# Patient Record
Sex: Female | Born: 1980 | Race: White | Hispanic: No | Marital: Married | Smoking: Never smoker
Health system: Southern US, Community
[De-identification: ages and names within clinical notes are randomized; demographics above are authoritative.]

## PROBLEM LIST (undated history)

## (undated) DIAGNOSIS — D689 Coagulation defect, unspecified: Secondary | ICD-10-CM

## (undated) DIAGNOSIS — E119 Type 2 diabetes mellitus without complications: Secondary | ICD-10-CM

## (undated) DIAGNOSIS — K700 Alcoholic fatty liver: Secondary | ICD-10-CM

## (undated) DIAGNOSIS — R609 Edema, unspecified: Secondary | ICD-10-CM

## (undated) DIAGNOSIS — E785 Hyperlipidemia, unspecified: Secondary | ICD-10-CM

## (undated) DIAGNOSIS — R251 Tremor, unspecified: Secondary | ICD-10-CM

## (undated) DIAGNOSIS — Z973 Presence of spectacles and contact lenses: Secondary | ICD-10-CM

## (undated) DIAGNOSIS — D649 Anemia, unspecified: Secondary | ICD-10-CM

## (undated) DIAGNOSIS — R011 Cardiac murmur, unspecified: Secondary | ICD-10-CM

## (undated) DIAGNOSIS — F1010 Alcohol abuse, uncomplicated: Secondary | ICD-10-CM

## (undated) DIAGNOSIS — R112 Nausea with vomiting, unspecified: Secondary | ICD-10-CM

## (undated) DIAGNOSIS — F419 Anxiety disorder, unspecified: Secondary | ICD-10-CM

## (undated) DIAGNOSIS — R002 Palpitations: Secondary | ICD-10-CM

## (undated) DIAGNOSIS — N920 Excessive and frequent menstruation with regular cycle: Secondary | ICD-10-CM

## (undated) DIAGNOSIS — N189 Chronic kidney disease, unspecified: Secondary | ICD-10-CM

## (undated) DIAGNOSIS — Z9889 Other specified postprocedural states: Secondary | ICD-10-CM

## (undated) DIAGNOSIS — K219 Gastro-esophageal reflux disease without esophagitis: Secondary | ICD-10-CM

## (undated) DIAGNOSIS — R9431 Abnormal electrocardiogram [ECG] [EKG]: Secondary | ICD-10-CM

## (undated) DIAGNOSIS — R1115 Cyclical vomiting syndrome unrelated to migraine: Secondary | ICD-10-CM

## (undated) DIAGNOSIS — E569 Vitamin deficiency, unspecified: Secondary | ICD-10-CM

## (undated) HISTORY — DX: Type 2 diabetes mellitus without complications: E11.9

## (undated) HISTORY — DX: Chronic kidney disease, unspecified: N18.9

## (undated) HISTORY — DX: Coagulation defect, unspecified: D68.9

---

## 2009-05-01 ENCOUNTER — Ambulatory Visit (HOSPITAL_COMMUNITY): Admission: RE | Admit: 2009-05-01 | Discharge: 2009-05-01 | Payer: Self-pay | Admitting: Obstetrics and Gynecology

## 2009-09-12 ENCOUNTER — Inpatient Hospital Stay (HOSPITAL_COMMUNITY): Admission: AD | Admit: 2009-09-12 | Discharge: 2009-09-14 | Payer: Self-pay | Admitting: Obstetrics and Gynecology

## 2010-10-31 LAB — CBC
HCT: 37.6 % (ref 36.0–46.0)
Hemoglobin: 12.5 g/dL (ref 12.0–15.0)
MCHC: 33.2 g/dL (ref 30.0–36.0)
MCHC: 33.8 g/dL (ref 30.0–36.0)
MCV: 89 fL (ref 78.0–100.0)
MCV: 89.7 fL (ref 78.0–100.0)
MCV: 89.7 fL (ref 78.0–100.0)
Platelets: 188 10*3/uL (ref 150–400)
Platelets: 216 10*3/uL (ref 150–400)
Platelets: ADEQUATE 10*3/uL (ref 150–400)
RBC: 3.92 MIL/uL (ref 3.87–5.11)
RDW: 13.1 % (ref 11.5–15.5)
RDW: 13.2 % (ref 11.5–15.5)
WBC: 9.3 10*3/uL (ref 4.0–10.5)

## 2017-06-10 ENCOUNTER — Ambulatory Visit (INDEPENDENT_AMBULATORY_CARE_PROVIDER_SITE_OTHER): Payer: Managed Care, Other (non HMO) | Admitting: Orthopaedic Surgery

## 2017-06-10 ENCOUNTER — Ambulatory Visit (INDEPENDENT_AMBULATORY_CARE_PROVIDER_SITE_OTHER): Payer: Managed Care, Other (non HMO)

## 2017-06-10 DIAGNOSIS — M25561 Pain in right knee: Secondary | ICD-10-CM

## 2017-06-10 MED ORDER — NAPROXEN 500 MG PO TABS: 500.0000 mg | ORAL_TABLET | Freq: Two times a day (BID) | ORAL | 3 refills | Status: DC

## 2017-06-10 NOTE — Progress Notes (Signed)
Office Visit Note   Patient: Janice Arnold           Date of Birth: 21-Jun-1981           MRN: [id number] Visit Date: 06/10/2017              Requested by: No referring provider defined for this encounter. PCP: Patient, No Pcp Per   Assessment & Plan: Visit Diagnoses:  1. Acute pain of right knee     Plan: Impression is right leg pain likely anterior muscular compartment strain.  Recommend ice and heat, relative rest, prescription for naproxen.  Questions encouraged and answered.  If not better should follow-up and will consider advanced imaging.  Follow-Up Instructions: Return if symptoms worsen or fail to improve.   Orders:  Orders Placed This Encounter  Procedures  . XR KNEE 3 VIEW RIGHT   Meds ordered this encounter  Medications  . naproxen (NAPROSYN) 500 MG tablet    Sig: Take 1 tablet (500 mg total) by mouth 2 (two) times daily with a meal.    Dispense:  30 tablet    Refill:  3      Procedures: No procedures performed   Clinical Data: No additional findings.   Subjective: Chief Complaint  Patient presents with  . Right Knee - Pain, Injury    Janice Arnold is a 36 year old female who slipped and fell on Friday and landed on her buttock.  She states that her right leg swelling outward.  She states she has constant 7 out of 10 pain that radiates up and down the leg.  Her pain is mainly on the anterolateral aspect of the proximal tibia.  She denies any swelling or bruising.  Denies any numbness and tingling.    Review of Systems  Constitutional: Negative.   HENT: Negative.   Eyes: Negative.   Respiratory: Negative.   Cardiovascular: Negative.   Endocrine: Negative.   Musculoskeletal: Negative.   Neurological: Negative.   Hematological: Negative.   Psychiatric/Behavioral: Negative.   All other systems reviewed and are negative.    Objective: Vital Signs: There were no vitals taken for this visit.  Physical Exam  Constitutional: She is oriented to  person, place, and time. She appears well-developed and well-nourished.  HENT:  Head: Normocephalic and atraumatic.  Eyes: EOM are normal.  Neck: Neck supple.  Pulmonary/Chest: Effort normal.  Abdominal: Soft.  Neurological: She is alert and oriented to person, place, and time.  Skin: Skin is warm. Capillary refill takes less than 2 seconds.  Psychiatric: She has a normal mood and affect. Her behavior is normal. Judgment and thought content normal.  Nursing note and vitals reviewed.   Ortho Exam Right knee exam shows no joint effusion.  Collaterals and cruciates are stable.  Patella tracking is normal.  She is tender over the proximal insertion of the anterior compartment.  Ankle plantarflexion and dorsiflexion elicits moderate pain.  There is no significant swelling or bruising that I can appreciate.  Negative Tinel's at the fibular head.  Foot is neurovascular intact. Specialty Comments:  No specialty comments available.  Imaging: Xr Knee 3 View Right  Result Date: 06/10/2017 No acute bony or structural abnormalities    PMFS History: There are no active problems to display for this patient.  No past medical history on file.  No family history on file.  No past surgical history on file. Social History   Occupational History  . Not on file.   Social History Main Topics  .  Smoking status: Not on file  . Smokeless tobacco: Not on file  . Alcohol use Not on file  . Drug use: Unknown  . Sexual activity: Not on file

## 2019-09-17 ENCOUNTER — Ambulatory Visit: Payer: Self-pay | Admitting: Family Medicine

## 2019-10-06 ENCOUNTER — Encounter: Payer: Self-pay | Admitting: Family Medicine

## 2019-10-06 ENCOUNTER — Ambulatory Visit (INDEPENDENT_AMBULATORY_CARE_PROVIDER_SITE_OTHER): Payer: Managed Care, Other (non HMO) | Admitting: Family Medicine

## 2019-10-06 ENCOUNTER — Other Ambulatory Visit: Payer: Self-pay

## 2019-10-06 VITALS — BP 132/76 | HR 90 | Temp 97.5°F | Ht 67.0 in | Wt 241.6 lb

## 2019-10-06 DIAGNOSIS — Z803 Family history of malignant neoplasm of breast: Secondary | ICD-10-CM

## 2019-10-06 DIAGNOSIS — Z0000 Encounter for general adult medical examination without abnormal findings: Secondary | ICD-10-CM | POA: Diagnosis not present

## 2019-10-06 DIAGNOSIS — L659 Nonscarring hair loss, unspecified: Secondary | ICD-10-CM

## 2019-10-06 DIAGNOSIS — Z23 Encounter for immunization: Secondary | ICD-10-CM

## 2019-10-06 DIAGNOSIS — Z114 Encounter for screening for human immunodeficiency virus [HIV]: Secondary | ICD-10-CM

## 2019-10-06 DIAGNOSIS — R590 Localized enlarged lymph nodes: Secondary | ICD-10-CM | POA: Diagnosis not present

## 2019-10-06 DIAGNOSIS — Z975 Presence of (intrauterine) contraceptive device: Secondary | ICD-10-CM

## 2019-10-06 LAB — CBC WITH DIFFERENTIAL/PLATELET
Basophils Absolute: 0.1 10*3/uL (ref 0.0–0.1)
Basophils Relative: 1.2 % (ref 0.0–3.0)
Eosinophils Absolute: 0.2 10*3/uL (ref 0.0–0.7)
Eosinophils Relative: 1.9 % (ref 0.0–5.0)
HCT: 41.4 % (ref 36.0–46.0)
Hemoglobin: 13.9 g/dL (ref 12.0–15.0)
Lymphocytes Relative: 28.3 % (ref 12.0–46.0)
Lymphs Abs: 2.4 10*3/uL (ref 0.7–4.0)
MCHC: 33.5 g/dL (ref 30.0–36.0)
MCV: 109.2 fl — ABNORMAL HIGH (ref 78.0–100.0)
Monocytes Absolute: 0.4 10*3/uL (ref 0.1–1.0)
Monocytes Relative: 4.4 % (ref 3.0–12.0)
Neutro Abs: 5.5 10*3/uL (ref 1.4–7.7)
Neutrophils Relative %: 64.2 % (ref 43.0–77.0)
Platelets: 309 10*3/uL (ref 150.0–400.0)
RBC: 3.8 Mil/uL — ABNORMAL LOW (ref 3.87–5.11)
RDW: 14.5 % (ref 11.5–15.5)
WBC: 8.6 10*3/uL (ref 4.0–10.5)

## 2019-10-06 LAB — COMPREHENSIVE METABOLIC PANEL
ALT: 38 U/L — ABNORMAL HIGH (ref 0–35)
AST: 113 U/L — ABNORMAL HIGH (ref 0–37)
Albumin: 4.1 g/dL (ref 3.5–5.2)
Alkaline Phosphatase: 75 U/L (ref 39–117)
BUN: 6 mg/dL (ref 6–23)
CO2: 27 mEq/L (ref 19–32)
Calcium: 9.4 mg/dL (ref 8.4–10.5)
Chloride: 103 mEq/L (ref 96–112)
Creatinine, Ser: 0.53 mg/dL (ref 0.40–1.20)
GFR: 128.95 mL/min (ref >60.00)
Glucose, Bld: 82 mg/dL (ref 70–99)
Potassium: 4 mEq/L (ref 3.5–5.1)
Sodium: 143 mEq/L (ref 135–145)
Total Bilirubin: 0.9 mg/dL (ref 0.2–1.2)
Total Protein: 7.6 g/dL (ref 6.0–8.3)

## 2019-10-06 LAB — LIPID PANEL
Cholesterol: 184 mg/dL (ref 0–200)
HDL: 52.6 mg/dL (ref >39.00)
LDL Cholesterol: 106 mg/dL — ABNORMAL HIGH (ref 0–99)
NonHDL: 130.93
Total CHOL/HDL Ratio: 3
Triglycerides: 123 mg/dL (ref 0.0–149.0)
VLDL: 24.6 mg/dL (ref 0.0–40.0)

## 2019-10-06 LAB — VITAMIN D 25 HYDROXY (VIT D DEFICIENCY, FRACTURES): VITD: <7 ng/mL — ABNORMAL LOW (ref 30.00–100.00)

## 2019-10-06 LAB — TSH: TSH: 3.16 u[IU]/mL (ref 0.35–4.50)

## 2019-10-06 NOTE — Patient Instructions (Signed)
1) flu shot today, need tdap in November of 2021 2) NEED GYN appointment!!!! IUD needs replaced and need pap smear!!!! 3) ordered diagnostic mammogram 4) checking labs( thyroid). Other thoughts on hair loss is telogen effluvium  5) after 4 weeks, email or call me about lymph nodes. If still there we need to CT you.    Preventive Care 75-39 Years Old, Female Preventive care refers to visits with your health care provider and lifestyle choices that can promote health and wellness. This includes:  A yearly physical exam. This may also be called an annual well check.  Regular dental visits and eye exams.  Immunizations.  Screening for certain conditions.  Healthy lifestyle choices, such as eating a healthy diet, getting regular exercise, not using drugs or products that contain nicotine and tobacco, and limiting alcohol use. What can I expect for my preventive care visit? Physical exam Your health care provider will check your:  Height and weight. This may be used to calculate body mass index (BMI), which tells if you are at a healthy weight.  Heart rate and blood pressure.  Skin for abnormal spots. Counseling Your health care provider may ask you questions about your:  Alcohol, tobacco, and drug use.  Emotional well-being.  Home and relationship well-being.  Sexual activity.  Eating habits.  Work and work Statistician.  Method of birth control.  Menstrual cycle.  Pregnancy history. What immunizations do I need?  Influenza (flu) vaccine  This is recommended every year. Tetanus, diphtheria, and pertussis (Tdap) vaccine  You may need a Td booster every 10 years. Varicella (chickenpox) vaccine  You may need this if you have not been vaccinated. Human papillomavirus (HPV) vaccine  If recommended by your health care provider, you may need three doses over 6 months. Measles, mumps, and rubella (MMR) vaccine  You may need at least one dose of MMR. You may also need  a second dose. Meningococcal conjugate (MenACWY) vaccine  One dose is recommended if you are age 4-21 years and a first-year college student living in a residence hall, or if you have one of several medical conditions. You may also need additional booster doses. Pneumococcal conjugate (PCV13) vaccine  You may need this if you have certain conditions and were not previously vaccinated. Pneumococcal polysaccharide (PPSV23) vaccine  You may need one or two doses if you smoke cigarettes or if you have certain conditions. Hepatitis A vaccine  You may need this if you have certain conditions or if you travel or work in places where you may be exposed to hepatitis A. Hepatitis B vaccine  You may need this if you have certain conditions or if you travel or work in places where you may be exposed to hepatitis B. Haemophilus influenzae type b (Hib) vaccine  You may need this if you have certain conditions. You may receive vaccines as individual doses or as more than one vaccine together in one shot (combination vaccines). Talk with your health care provider about the risks and benefits of combination vaccines. What tests do I need?  Blood tests  Lipid and cholesterol levels. These may be checked every 5 years starting at age 63.  Hepatitis C test.  Hepatitis B test. Screening  Diabetes screening. This is done by checking your blood sugar (glucose) after you have not eaten for a while (fasting).  Sexually transmitted disease (STD) testing.  BRCA-related cancer screening. This may be done if you have a family history of breast, ovarian, tubal, or peritoneal cancers.  Pelvic exam and Pap test. This may be done every 3 years starting at age 21. Starting at age 30, this may be done every 5 years if you have a Pap test in combination with an HPV test. Talk with your health care provider about your test results, treatment options, and if necessary, the need for more tests. Follow these  instructions at home: Eating and drinking   Eat a diet that includes fresh fruits and vegetables, whole grains, lean protein, and low-fat dairy.  Take vitamin and mineral supplements as recommended by your health care provider.  Do not drink alcohol if: ? Your health care provider tells you not to drink. ? You are pregnant, may be pregnant, or are planning to become pregnant.  If you drink alcohol: ? Limit how much you have to 0-1 drink a day. ? Be aware of how much alcohol is in your drink. In the [place] one drink equals one 12 oz bottle of beer (355 mL), one 5 oz glass of wine (148 mL), or one 1 oz glass of hard liquor (44 mL). Lifestyle  Take daily care of your teeth and gums.  Stay active. Exercise for at least 30 minutes on 5 or more days each week.  Do not use any products that contain nicotine or tobacco, such as cigarettes, e-cigarettes, and chewing tobacco. If you need help quitting, ask your health care provider.  If you are sexually active, practice safe sex. Use a condom or other form of birth control (contraception) in order to prevent pregnancy and STIs (sexually transmitted infections). If you plan to become pregnant, see your health care provider for a preconception visit. What's next?  Visit your health care provider once a year for a well check visit.  Ask your health care provider how often you should have your eyes and teeth checked.  Stay up to date on all vaccines. This information is not intended to replace advice given to you by your health care provider. Make sure you discuss any questions you have with your health care provider. Document Revised: 04/09/2018 Document Reviewed: 04/09/2018 Elsevier Patient Education  2020 Elsevier Inc.  

## 2019-10-06 NOTE — Progress Notes (Signed)
Patient: Janice Arnold MRN: [id number] DOB: [birth year] PCP: Orma Flaming, MD     Subjective:  Chief Complaint  Patient presents with  . Annual Exam  . Alopecia  . enlarged lymph node    HPI: The patient is a 39 y.o. female who presents today for annual exam. She denies any changes to past medical history. There have been no recent hospitalizations. They are following a well balanced diet and exercise plan. Weight has been decreasing steadily. She was at 370 in 2019. She has lost a significant amount of weight since that time.  She has concerns of hair loss and possible swollen lymph node.   She has a strong family history of breast cancer in her maternal side including mom (41s), grandmother (26s). No colon cancer in first degree relative. No HTN or diabetes.    She first noticed her hair loss in April of 2019. Her entire head has now thinned out noticeably. She doesn't feel like it's growing. She was under a lot of stress at that time. She has not had her thyroid checked. No round circles of hair loss. No hair loss over any other part of body.   Lump above left clavicle and around her left jaw. First noticed about 1 week ago because it was painful in this area. They are not growing, but are tender. She denies any bruising/rash/bleeding. No fever/night sweats.   Immunization History  Administered Date(s) Administered  . Influenza,inj,Quad PF,6+ Mos 10/06/2019    Colonoscopy: routine screening.  Mammogram: needs this with family history/lymph node  Pap smear: 10 years.    Review of Systems  Constitutional: Negative for chills, fatigue and fever.  HENT: Negative for sinus pressure, sinus pain and sore throat.   Respiratory: Negative for cough, shortness of breath and wheezing.   Cardiovascular: Negative for chest pain and palpitations.  Gastrointestinal: Negative for abdominal pain, nausea and vomiting.  Endocrine:       Hair loss   Genitourinary: Negative for frequency,  pelvic pain and urgency.  Musculoskeletal: Negative for back pain, joint swelling and neck pain.  Neurological: Negative for dizziness and headaches.  Hematological: Positive for adenopathy.    Allergies Patient has No Known Allergies.  Past Medical History Patient  has no past medical history on file.  Surgical History Patient  has no past surgical history on file.  Family History Pateint's family history includes Breast cancer in her mother; Cervical cancer in her mother.  Social History Patient  reports that she has never smoked. She has never used smokeless tobacco. She reports current alcohol use of about 14.0 standard drinks of alcohol per week. She reports that she does not use drugs.    Objective: Vitals:   10/06/19 0829  BP: 132/76  Pulse: 90  Temp: (!) 97.5 F (36.4 C)  TempSrc: Temporal  SpO2: 97%  Weight: 241 lb 9.6 oz (109.6 kg)  Height: 5\' 7"  (1.702 m)    Body mass index is 37.84 kg/m.  Physical Exam Vitals reviewed.  Constitutional:      Appearance: Normal appearance. She is well-developed. She is obese.  HENT:     Head: Normocephalic and atraumatic.     Right Ear: Tympanic membrane, ear canal and external ear normal.     Left Ear: Tympanic membrane, ear canal and external ear normal.     Nose: Nose normal.     Mouth/Throat:     Mouth: Mucous membranes are moist.     Comments: Poor dentition  Eyes:  Extraocular Movements: Extraocular movements intact.     Conjunctiva/sclera: Conjunctivae normal.     Pupils: Pupils are equal, round, and reactive to light.  Neck:     Thyroid: No thyromegaly.  Cardiovascular:     Rate and Rhythm: Normal rate and regular rhythm.     Heart sounds: Normal heart sounds. No murmur.  Pulmonary:     Effort: Pulmonary effort is normal.     Breath sounds: Normal breath sounds.  Abdominal:     General: Bowel sounds are normal. There is no distension.     Palpations: Abdomen is soft.     Tenderness: There is no  abdominal tenderness.  Musculoskeletal:     Cervical back: Normal range of motion and neck supple.  Lymphadenopathy:     Head:     Left side of head: Submandibular adenopathy present.     Cervical: No cervical adenopathy.     Upper Body:     Left upper body: Supraclavicular adenopathy present. No axillary adenopathy.  Skin:    General: Skin is warm and dry.     Capillary Refill: Capillary refill takes less than 2 seconds.     Findings: No rash.     Comments: Hair pull test negative. Thinning of hair globally   Neurological:     General: No focal deficit present.     Mental Status: She is alert and oriented to person, place, and time.     Cranial Nerves: No cranial nerve deficit.     Coordination: Coordination normal.     Deep Tendon Reflexes: Reflexes normal.  Psychiatric:        Mood and Affect: Mood normal.        Behavior: Behavior normal.        Office Visit from 10/06/2019 in [facility]  PHQ-2 Total Score  0         Assessment/plan: 1. Annual physical exam Routine lab work today. Flu shot today. Needs pap smear, but also needs iud removed. Will have her f/u with gyn as she wants another iud put in place. Discussed diet/exercise. May want referral to healthy weight and wellness, but wants to get labs back first. Really needs to see dentist as well.  Patient counseling [x]    Nutrition: Stressed importance of moderation in sodium/caffeine intake, saturated fat and cholesterol, caloric balance, sufficient intake of fresh fruits, vegetables, fiber, calcium, iron, and 1 mg of folate supplement per day (for females capable of pregnancy).  [x]    Stressed the importance of regular exercise.   []    Substance Abuse: Discussed cessation/primary prevention of tobacco, alcohol, or other drug use; driving or other dangerous activities under the influence; availability of treatment for abuse.   [x]    Injury prevention: Discussed safety belts, safety helmets, smoke  detector, smoking near bedding or upholstery.   [x]    Sexuality: Discussed sexually transmitted diseases, partner selection, use of condoms, avoidance of unintended pregnancy  and contraceptive alternatives.  [x]    Dental health: Discussed importance of regular tooth brushing, flossing, and dental visits.  [x]    Health maintenance and immunizations reviewed. Please refer to Health maintenance section.    - CBC with Differential/Platelet - Comprehensive metabolic panel - Lipid panel - TSH - VITAMIN D 25 Hydroxy (Vit-D Deficiency, Fractures)  2. Encounter for screening for HIV  - HIV Antibody (routine testing w rflx)  3. Supraclavicular lymphadenopathy -mmg ordered with family hx and this finding. Will get labs back and discussed next step is CT  of her neck/chest.   Submandibular lymph node Could be secondary to tooth that needs pulled and poor gum health. No abscess seen or indication for antibiotics, but recommended she see dentist asap. Let me know if any feverrworsening symptoms.  - MM DIGITAL SCREENING BILATERAL; Future  4. Family history of breast cancer  - MM DIGITAL SCREENING BILATERAL; Future  5. Hair thinning thyroid vs. Genetics vs. Telogen effluvium. No findings to warrant ANA at this time or other auto immune associated hair loss.   6. IUD (intrauterine device) in place Has had x 10 years. Needs removed. Discussed I could do, but if wants another would have her f/u with gyn. She has a gyn she would like to use and advised back up contraception.   Total time of encounter: 45 minutes total time of encounter, including 30 minutes spent in face-to-face patient care. This time includes coordination of care and counseling regarding new patient care, annual, HM, acute complaints and work up. Remainder of non-face-to-face time involved reviewing chart documents/testing relevant to the patient encounter and documentation in the medical record.   This visit occurred during the  SARS-CoV-2 public health emergency.  Safety protocols were in place, including screening questions prior to the visit, additional usage of staff PPE, and extensive cleaning of exam room while observing appropriate contact time as indicated for disinfecting solutions.     Return if symptoms worsen or fail to improve.     Orma Flaming, MD [place]  10/06/2019

## 2019-10-07 ENCOUNTER — Encounter: Payer: Self-pay | Admitting: Family Medicine

## 2019-10-07 ENCOUNTER — Other Ambulatory Visit: Payer: Self-pay | Admitting: Family Medicine

## 2019-10-07 DIAGNOSIS — E559 Vitamin D deficiency, unspecified: Secondary | ICD-10-CM | POA: Insufficient documentation

## 2019-10-07 DIAGNOSIS — R7401 Elevation of levels of liver transaminase levels: Secondary | ICD-10-CM | POA: Insufficient documentation

## 2019-10-07 LAB — HIV ANTIBODY (ROUTINE TESTING W REFLEX): HIV 1&2 Ab, 4th Generation: NONREACTIVE

## 2019-10-07 MED ORDER — VITAMIN D (ERGOCALCIFEROL) 1.25 MG (50000 UNIT) PO CAPS: ORAL_CAPSULE | ORAL | 0 refills | Status: DC

## 2019-10-11 ENCOUNTER — Telehealth: Payer: Self-pay | Admitting: Family Medicine

## 2019-10-11 NOTE — Telephone Encounter (Signed)
Called [facility] to make aware of order for Diagnostic mammogram placed. They will reach out to pt to schedule.

## 2019-10-11 NOTE — Addendum Note (Signed)
Addended by: Orma Flaming on: 10/11/2019 01:25 PM   Modules accepted: Orders

## 2019-10-11 NOTE — Telephone Encounter (Signed)
Patient called in this morning and said her mammogram was sent over as a screening but in the notes it states it is for diagnostic, the breast center is needing this changed before they schedule an appointment.

## 2019-10-11 NOTE — Telephone Encounter (Signed)
Please let them know that I put in order for diagnostic mmg.  Thanks,  Dr. Rogers Blocker

## 2019-10-11 NOTE — Telephone Encounter (Signed)
Please Advise

## 2019-10-12 ENCOUNTER — Other Ambulatory Visit: Payer: Self-pay | Admitting: Family Medicine

## 2019-10-12 DIAGNOSIS — R590 Localized enlarged lymph nodes: Secondary | ICD-10-CM

## 2019-10-12 DIAGNOSIS — Z803 Family history of malignant neoplasm of breast: Secondary | ICD-10-CM

## 2019-10-28 ENCOUNTER — Ambulatory Visit: Payer: Managed Care, Other (non HMO)

## 2019-10-28 ENCOUNTER — Other Ambulatory Visit: Payer: Self-pay | Admitting: Family Medicine

## 2019-10-28 ENCOUNTER — Other Ambulatory Visit: Payer: Self-pay

## 2019-10-28 ENCOUNTER — Ambulatory Visit
Admission: RE | Admit: 2019-10-28 | Discharge: 2019-10-28 | Disposition: A | Discharge status: Home / Self Care | Payer: Managed Care, Other (non HMO) | Source: Ambulatory Visit | Attending: Family Medicine | Admitting: Family Medicine

## 2019-10-28 DIAGNOSIS — Z803 Family history of malignant neoplasm of breast: Secondary | ICD-10-CM

## 2019-10-28 DIAGNOSIS — R921 Mammographic calcification found on diagnostic imaging of breast: Secondary | ICD-10-CM

## 2019-10-28 DIAGNOSIS — R590 Localized enlarged lymph nodes: Secondary | ICD-10-CM

## 2019-10-29 ENCOUNTER — Other Ambulatory Visit: Payer: Self-pay | Admitting: Family Medicine

## 2019-10-29 DIAGNOSIS — Z803 Family history of malignant neoplasm of breast: Secondary | ICD-10-CM

## 2019-11-04 ENCOUNTER — Ambulatory Visit: Payer: Managed Care, Other (non HMO) | Attending: Internal Medicine

## 2019-11-04 DIAGNOSIS — Z23 Encounter for immunization: Secondary | ICD-10-CM

## 2019-11-04 NOTE — Progress Notes (Signed)
   Covid-19 Vaccination Clinic  Name:  AYRIAN CARRISALEZ    MRN: [id number] DOB: 1980-10-10  11/04/2019  Ms. Zuker was observed post Covid-19 immunization for 15 minutes without incident. She was provided with Vaccine Information Sheet and instruction to access the V-Safe system.   Ms. Lamour was instructed to call 911 with any severe reactions post vaccine: Marland Kitchen Difficulty breathing  . Swelling of face and throat  . A fast heartbeat  . A bad rash all over body  . Dizziness and weakness   Immunizations Administered    Name Date Dose VIS Date Route   Moderna COVID-19 Vaccine 11/04/2019 12:05 PM 0.5 mL 07/13/2019 Intramuscular   Manufacturer: Moderna   Lot: [id number]   [id number]

## 2019-11-15 ENCOUNTER — Ambulatory Visit: Payer: Self-pay | Admitting: Family Medicine

## 2019-11-25 ENCOUNTER — Telehealth: Payer: Self-pay | Admitting: Family Medicine

## 2019-11-25 DIAGNOSIS — R590 Localized enlarged lymph nodes: Secondary | ICD-10-CM

## 2019-11-25 NOTE — Telephone Encounter (Signed)
Spoke with pt to give message below. Pt says that she will call to see if she can get an appointment soon, this has been going on for awhile. Pt voiced understanding.

## 2019-11-25 NOTE — Telephone Encounter (Signed)
Received a call from the patient, she says Dr.Wolfe told her to call in if her lymph nodes were still swollen after 4 weeks and patient states it still has not gone down. Would like the scan of the head/neck that was offered.

## 2019-11-25 NOTE — Telephone Encounter (Signed)
Please Advise

## 2019-11-25 NOTE — Telephone Encounter (Signed)
Lvm for pt to call the office back. 

## 2019-11-25 NOTE — Telephone Encounter (Signed)
Yes, I put this order in for her. Once this is scheduled she needs to come have her renal function checked here. I put order in for this. They have to have renal function within 30 days of imaging.   Dr. Rogers Blocker

## 2019-12-02 ENCOUNTER — Other Ambulatory Visit: Payer: Self-pay | Admitting: Family Medicine

## 2019-12-02 ENCOUNTER — Other Ambulatory Visit: Payer: Managed Care, Other (non HMO)

## 2019-12-02 DIAGNOSIS — R590 Localized enlarged lymph nodes: Secondary | ICD-10-CM

## 2019-12-07 ENCOUNTER — Ambulatory Visit: Payer: Managed Care, Other (non HMO) | Attending: Internal Medicine

## 2019-12-07 DIAGNOSIS — Z23 Encounter for immunization: Secondary | ICD-10-CM

## 2019-12-07 NOTE — Progress Notes (Signed)
   Covid-19 Vaccination Clinic  Name:  Janice Arnold    MRN: [id number] DOB: 16-Jun-1981  12/07/2019  [name] was observed post Covid-19 immunization for 15 minutes without incident. She was provided with Vaccine Information Sheet and instruction to access the V-Safe system.   [name] was instructed to call 911 with any severe reactions post vaccine: Marland Kitchen Difficulty breathing  . Swelling of face and throat  . A fast heartbeat  . A bad rash all over body  . Dizziness and weakness   Immunizations Administered    Name Date Dose VIS Date Route   Moderna COVID-19 Vaccine 12/07/2019 10:40 AM 0.5 mL 07/2019 Intramuscular   Manufacturer: Moderna   Lot: [id number]   [id number]

## 2019-12-10 ENCOUNTER — Ambulatory Visit
Admission: RE | Admit: 2019-12-10 | Discharge: 2019-12-10 | Disposition: A | Discharge status: Home / Self Care | Payer: Managed Care, Other (non HMO) | Source: Ambulatory Visit | Attending: Family Medicine | Admitting: Family Medicine

## 2019-12-10 DIAGNOSIS — R590 Localized enlarged lymph nodes: Secondary | ICD-10-CM

## 2020-01-04 ENCOUNTER — Other Ambulatory Visit: Payer: Self-pay | Admitting: Family Medicine

## 2020-02-26 ENCOUNTER — Emergency Department (HOSPITAL_COMMUNITY): Payer: Managed Care, Other (non HMO)

## 2020-02-26 ENCOUNTER — Encounter (HOSPITAL_COMMUNITY): Payer: Self-pay | Admitting: Emergency Medicine

## 2020-02-26 ENCOUNTER — Emergency Department (HOSPITAL_COMMUNITY)
Admission: EM | Admit: 2020-02-26 | Discharge: 2020-02-26 | Disposition: A | Discharge status: Home / Self Care | Payer: Managed Care, Other (non HMO) | Attending: Emergency Medicine | Admitting: Emergency Medicine

## 2020-02-26 ENCOUNTER — Other Ambulatory Visit: Payer: Self-pay

## 2020-02-26 DIAGNOSIS — Z975 Presence of (intrauterine) contraceptive device: Secondary | ICD-10-CM | POA: Diagnosis not present

## 2020-02-26 DIAGNOSIS — Y999 Unspecified external cause status: Secondary | ICD-10-CM | POA: Diagnosis not present

## 2020-02-26 DIAGNOSIS — S93602A Unspecified sprain of left foot, initial encounter: Secondary | ICD-10-CM | POA: Insufficient documentation

## 2020-02-26 DIAGNOSIS — S99922A Unspecified injury of left foot, initial encounter: Secondary | ICD-10-CM | POA: Diagnosis present

## 2020-02-26 DIAGNOSIS — Y9301 Activity, walking, marching and hiking: Secondary | ICD-10-CM | POA: Diagnosis not present

## 2020-02-26 DIAGNOSIS — Z23 Encounter for immunization: Secondary | ICD-10-CM | POA: Diagnosis not present

## 2020-02-26 DIAGNOSIS — W010XXA Fall on same level from slipping, tripping and stumbling without subsequent striking against object, initial encounter: Secondary | ICD-10-CM | POA: Insufficient documentation

## 2020-02-26 DIAGNOSIS — Y929 Unspecified place or not applicable: Secondary | ICD-10-CM | POA: Diagnosis not present

## 2020-02-26 MED ORDER — IBUPROFEN 400 MG PO TABS
600.0000 mg | ORAL_TABLET | Freq: Once | ORAL | Status: AC
  Administered 2020-02-26: 600 mg via ORAL
  Filled 2020-02-26: qty 1

## 2020-02-26 MED ORDER — TETANUS-DIPHTH-ACELL PERTUSSIS 5-2.5-18.5 LF-MCG/0.5 IM SUSP
0.5000 mL | Freq: Once | INTRAMUSCULAR | Status: AC
  Administered 2020-02-26: 0.5 mL via INTRAMUSCULAR
  Filled 2020-02-26: qty 0.5

## 2020-02-26 NOTE — ED Triage Notes (Signed)
Pt states she tripped and fell last night.  C/o pain, swelling, and abrasion to L lateral foot.  Small abrasion to R lateral foot but denies R foot pain.  Denies LOC.

## 2020-02-26 NOTE — ED Provider Notes (Signed)
[facility] DEPART[facility] Provider Note   CSN: [id number] Arrival date & time: 02/26/20  1047     History Chief Complaint  Patient presents with  . Foot Pain    CAROLJEAN MONSIVAIS is a 39 y.o. female.  Patient is a 39 year old female who presents with left foot pain.  She said last night about 1130 she was walking with some high heels and tripped, falling onto her left foot.  She has had pain and swelling to that foot since that time.  She has some abrasions to both feet but she denies any pain in her right foot.  She denies any other injuries from the fall.  She thinks her tetanus shot is out of date.        History reviewed. No pertinent past medical history.  Patient Active Problem List   Diagnosis Date Noted  . Vitamin D deficiency 10/07/2019  . Transaminitis 10/07/2019  . IUD (intrauterine device) in place 10/06/2019    History reviewed. No pertinent surgical history.   OB History   No obstetric history on file.     Family History  Problem Relation Age of Onset  . Breast cancer Mother   . Cervical cancer Mother   . Breast cancer Maternal Grandmother     Social History   Tobacco Use  . Smoking status: Never Smoker  . Smokeless tobacco: Never Used  Substance Use Topics  . Alcohol use: Yes    Alcohol/week: 14.0 standard drinks    Types: 14 Glasses of wine per week  . Drug use: Never    Home Medications Prior to Admission medications   Medication Sig Start Date End Date Taking? Authorizing Provider  naproxen (NAPROSYN) 500 MG tablet Take 1 tablet (500 mg total) by mouth 2 (two) times daily with a meal. Patient not taking: Reported on 10/06/2019 06/10/17   Leandrew Koyanagi, MD  Vitamin D, Ergocalciferol, (DRISDOL) 1.25 MG (50000 UNIT) CAPS capsule TAKE 1 CAPSULE BY MOUTH ONCE A WEEK FOR 12 WEEKS. THEN TAKE 4000IU/DAY 01/04/20   Orma Flaming, MD    Allergies    Patient has no known allergies.  Review of Systems   Review of  Systems  Constitutional: Negative for fever.  Gastrointestinal: Negative for nausea and vomiting.  Musculoskeletal: Positive for arthralgias and joint swelling. Negative for back pain and neck pain.  Skin: Positive for wound (Abrasions).  Neurological: Negative for weakness, numbness and headaches.    Physical Exam Updated Vital Signs BP 132/87   Pulse 73   Temp 97.9 F (36.6 C) (Oral)   Resp 18   Ht 5\' 7"  (1.702 m)   Wt 103 kg   LMP 02/19/2020   SpO2 96%   BMI 35.55 kg/m   Physical Exam Constitutional:      Appearance: She is well-developed.  HENT:     Head: Normocephalic and atraumatic.  Cardiovascular:     Rate and Rhythm: Normal rate.  Pulmonary:     Effort: Pulmonary effort is normal.  Musculoskeletal:        General: Tenderness present.     Cervical back: Normal range of motion and neck supple.     Comments: Patient has swelling and ecchymosis over the lateral aspect of her left foot.  There is superficial overlying abrasion.  There is no bony tenderness to the ankle or lower leg.  Pedal pulses are intact.  She has normal sensation distally.  Motor function is intact but limited due to patient's discomfort.  There is small abrasion to the dorsal aspect of her right foot but no underlying bony tenderness.  Skin:    General: Skin is warm and dry.  Neurological:     Mental Status: She is alert and oriented to person, place, and time.     ED Results / Procedures / Treatments   Labs (all labs ordered are listed, but only abnormal results are displayed) Labs Reviewed - No data to display  EKG None  Radiology DG Foot Complete Left  Result Date: 02/26/2020 CLINICAL DATA:  Left foot pain after fall last night. EXAM: LEFT FOOT - COMPLETE 3+ VIEW COMPARISON:  None. FINDINGS: There is no evidence of fracture or dislocation. There is no evidence of arthropathy or other focal bone abnormality. Soft tissues are unremarkable. IMPRESSION: Negative. Electronically Signed    By: Marijo Conception M.D.   On: 02/26/2020 12:29    Procedures Procedures (including critical care time)  Medications Ordered in ED Medications  Tdap (BOOSTRIX) injection 0.5 mL (0.5 mLs Intramuscular Given 02/26/20 1222)  ibuprofen (ADVIL) tablet 600 mg (600 mg Oral Given 02/26/20 1220)    ED Course  I have reviewed the triage vital signs and the nursing notes.  Pertinent labs & imaging results that were available during my care of the patient were reviewed by me and considered in my medical decision making (see chart for details).    MDM Rules/Calculators/A&P                         Patient's tetanus shot was updated in the ED.  X-ray showed no evidence of fracture.  These were reviewed by me as well.  She was placed in a postop shoe.  She declined the need for crutches.  She was given ibuprofen.  She was advised in symptomatic care.  She was given a referral to follow-up with orthopedics if her symptoms are not improving. Final Clinical Impression(s) / ED Diagnoses Final diagnoses:  Sprain of left foot, initial encounter    Rx / DC Orders ED Discharge Orders    None       Malvin Johns, MD 02/26/20 1355

## 2020-03-17 ENCOUNTER — Encounter: Payer: Self-pay | Admitting: Orthopedic Surgery

## 2020-03-17 ENCOUNTER — Telehealth: Payer: Self-pay

## 2020-03-17 ENCOUNTER — Other Ambulatory Visit: Payer: Self-pay

## 2020-03-17 ENCOUNTER — Ambulatory Visit (INDEPENDENT_AMBULATORY_CARE_PROVIDER_SITE_OTHER): Payer: Managed Care, Other (non HMO) | Admitting: Orthopedic Surgery

## 2020-03-17 DIAGNOSIS — M25572 Pain in left ankle and joints of left foot: Secondary | ICD-10-CM | POA: Diagnosis not present

## 2020-03-17 DIAGNOSIS — M79672 Pain in left foot: Secondary | ICD-10-CM | POA: Diagnosis not present

## 2020-03-17 NOTE — Telephone Encounter (Signed)
Pt's ultrasound on foot needs to be changed to without contrast. Order was placed by Dr. Darrall Dears office

## 2020-03-17 NOTE — Progress Notes (Signed)
Office Visit Note   Patient: Janice Arnold           Date of Birth: 13-Aug-1980           MRN: [id number] Visit Date: 03/17/2020 Requested by: Orma Flaming, [address] [place],  [place] [address] PCP: Orma Flaming, MD  Subjective: Chief Complaint  Patient presents with  . Right Ankle - Pain    HPI: Janice Arnold is a 39 year old patient who injured her left foot 3 weeks ago.  This sounds like it was an inversion type injury.  She reports pain and swelling primarily on the dorsal lateral foot region.  She has been weightbearing.  Injury occurred when she was wearing some type of heels and her foot inverted.  She has been taking some over-the-counter medication with some relief but the symptoms are actually worsening over the past 3 weeks compared to improving.              ROS: All systems reviewed are negative as they relate to the chief complaint within the history of present illness.  Patient denies  fevers or chills.   Assessment & Plan: Visit Diagnoses:  1. Pain in left ankle and joints of left foot     Plan: Impression is left foot and ankle pain with no real tenderness over the ATFL and CFL.  No pain with pronation supination of the forefoot.  I think she may have an occult fracture potentially around the cuboid or elsewhere in the midfoot.  The amount of swelling and bruising present 3 weeks post injury is consistent with that diagnosis even though its not visible on plain radiographs.  Plan a CT scan foot and ankle to evaluate for occult fracture follow-up after that study.  Follow-Up Instructions: No follow-ups on file.   Orders:  No orders of the defined types were placed in this encounter.  No orders of the defined types were placed in this encounter.     Procedures: No procedures performed   Clinical Data: No additional findings.  Objective: Vital Signs: LMP 02/19/2020   Physical Exam:   Constitutional: Patient appears well-developed HEENT:    Head: Normocephalic Eyes:EOM are normal Neck: Normal range of motion Cardiovascular: Normal rate Pulmonary/chest: Effort normal Neurologic: Patient is alert Skin: Skin is warm Psychiatric: Patient has normal mood and affect    Ortho Exam: Ortho exam demonstrates intact anterior to posterior to peroneal Achilles tendons.  Symmetric tibiotalar subtalar transverse tarsal range of motion.  No real pain with pronation supination of the forefoot.  She does have tenderness around the lateral three metatarsals as well as pain within the midfoot region to direct palpation.  No real ankle instability present with anterior drawer testing and varus tilt testing.  Ecchymosis present in the lateral three toes.  MTP range of motion full with no significant tenderness.  Specialty Comments:  No specialty comments available.  Imaging: No results found.   PMFS History: Patient Active Problem List   Diagnosis Date Noted  . Vitamin D deficiency 10/07/2019  . Transaminitis 10/07/2019  . IUD (intrauterine device) in place 10/06/2019   History reviewed. No pertinent past medical history.  Family History  Problem Relation Age of Onset  . Breast cancer Mother   . Cervical cancer Mother   . Breast cancer Maternal Grandmother     History reviewed. No pertinent surgical history. Social History   Occupational History  . Not on file  Tobacco Use  . Smoking status: Never Smoker  .  Smokeless tobacco: Never Used  Substance and Sexual Activity  . Alcohol use: Yes    Alcohol/week: 14.0 standard drinks    Types: 14 Glasses of wine per week  . Drug use: Never  . Sexual activity: Yes

## 2020-03-22 ENCOUNTER — Other Ambulatory Visit: Payer: Self-pay | Admitting: Family Medicine

## 2020-03-28 ENCOUNTER — Telehealth: Payer: Self-pay | Admitting: Orthopedic Surgery

## 2020-03-28 NOTE — Telephone Encounter (Signed)
Taron with [facility] called stating she wasn't able to get a CB from [place] and the pt has an CT for her foot and ankle and only one code was sent along with only one authorization sent in; Taron would like to know wether another authorization will be sent in or if she should cancel one of the CTs? And if so which one?   [phone]

## 2020-03-28 NOTE — Telephone Encounter (Signed)
IC advised per last OV note ok to cancel CT ankle and proceed with CT of foot only since only had 1 auth.

## 2020-03-29 ENCOUNTER — Other Ambulatory Visit: Payer: Managed Care, Other (non HMO)

## 2020-03-30 NOTE — Telephone Encounter (Signed)
DONE

## 2020-09-05 ENCOUNTER — Telehealth: Payer: Self-pay

## 2020-09-05 NOTE — Telephone Encounter (Signed)
Nurse Assessment Nurse: Ysidro Evert, RN, Levada Dy Date/Time Eilene Ghazi Time): 09/05/2020 2:55:29 PM Confirm and document reason for call. If symptomatic, describe symptoms. ---Caller states she fell down 4 stairs on Friday and hurt her nose. It is swollen and bruised and painful Does the patient have any new or worsening symptoms? ---Yes Will a triage be completed? ---Yes Related visit to physician within the last 2 weeks? ---No Does the PT have any chronic conditions? (i.e. diabetes, asthma, this includes High risk factors for pregnancy, etc.) ---No Is the patient pregnant or possibly pregnant? (Ask all females between the ages of 27-55) ---No Is this a behavioral health or substance abuse call? ---No Guidelines Guideline Title Affirmed Question Affirmed Notes Nurse Date/Time (Eastern Time) Nose Injury [1] SEVERE pain AND [2] not improved 2 hours after pain medicine/ice packs Ysidro Evert, RN, Levada Dy 09/05/2020 2:56:52 PM Disp. Time Eilene Ghazi Time) Disposition Final User 09/05/2020 3:02:22 PM See HCP within 4 Hours (or PCP triage) Yes Ysidro Evert, RN, Levada Dy PLEASE NOTE: All timestamps contained within this report are represented as [place] Standard Time. CONFIDENTIALTY NOTICE: This fax transmission is intended only for the addressee. It contains information that is legally privileged, confidential or otherwise protected from use or disclosure. If you are not the intended recipient, you are strictly prohibited from reviewing, disclosing, copying using or disseminating any of this information or taking any action in reliance on or regarding this information. If you have received this fax in error, please notify us immediately by telephone so that we can arrange for its return to [place]. Phone: [phone], Toll-Free: [phone], Fax: [phone] Page: 2 of 2 Call Id: [id number] [place] Disagree/Comply Comply Caller Understands Yes PreDisposition Did not know what to do Care Advice Given Per Guideline SEE  HCP (OR PCP TRIAGE) WITHIN 4 HOURS: * IF OFFICE WILL BE OPEN: You need to be seen within the next 3 or 4 hours. Call your doctor (or NP/PA) now or as soon as the office opens. USE A COLD PACK FOR PAIN, SWELLING, OR BRUISING: * Put a cold pack or an ice bag (wrapped in a moist towel) on the area for 20 minutes. * Repeat in 1 hour, then as needed. CALL BACK IF: * You become worse CARE ADVICE given per Nose Injury (Adult) guideline. Referrals GO TO FACILITY UNDECIDE

## 2020-09-06 NOTE — Telephone Encounter (Signed)
FYI

## 2020-12-26 IMAGING — US US SOFT TISSUE HEAD/NECK
1 series · 13 of 25 positions shown · non-contrast
Comparison: None.

CLINICAL DATA: Left supraclavicular lymphadenopathy for the past 2
months. Right submandibular pain.

EXAM:
ULTRASOUND OF HEAD/NECK SOFT TISSUES
TECHNIQUE: Ultrasound examination of the head and neck soft tissues was
performed in the area of clinical concern.

[Series 1: us soft tissue head/neck · 0.05mm/px · 13 of 36 slices shown]
[im 1/36]
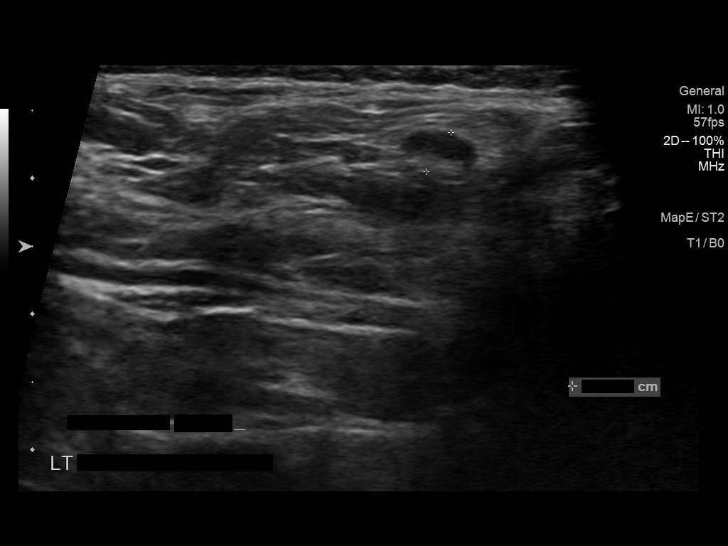
[im 3/36]
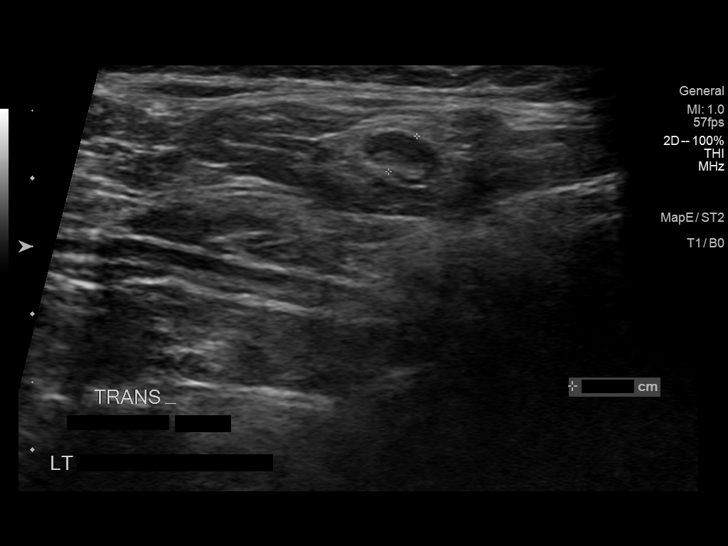
[im 6/36]
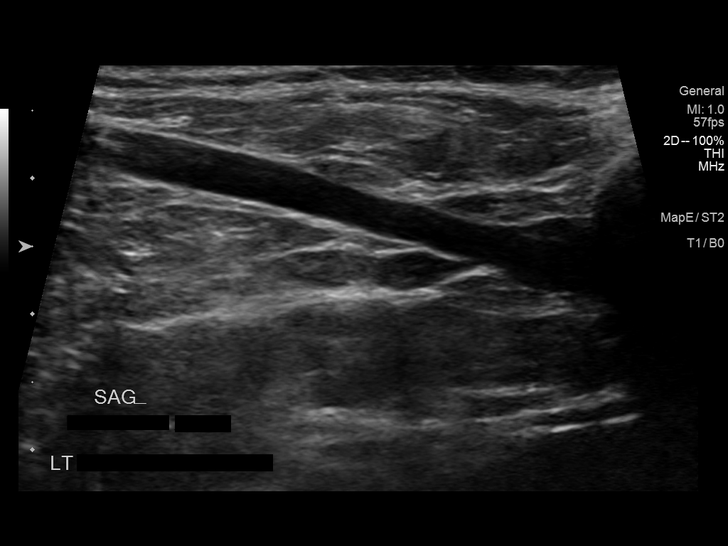
[im 9/36]
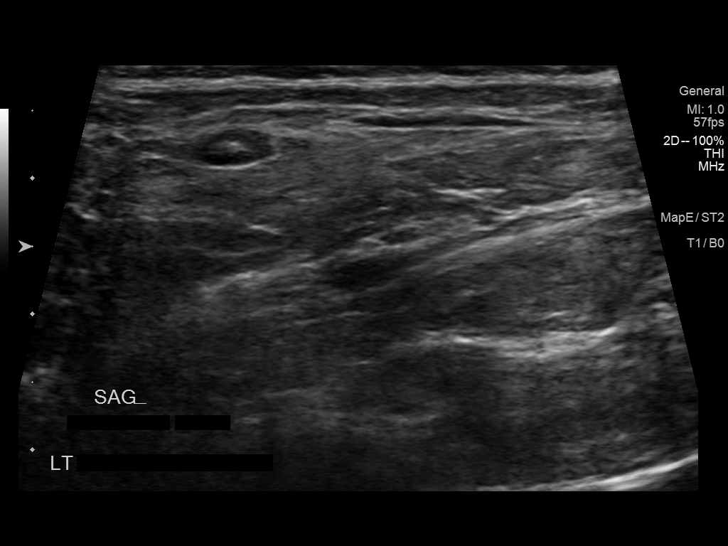
[im 12/36]
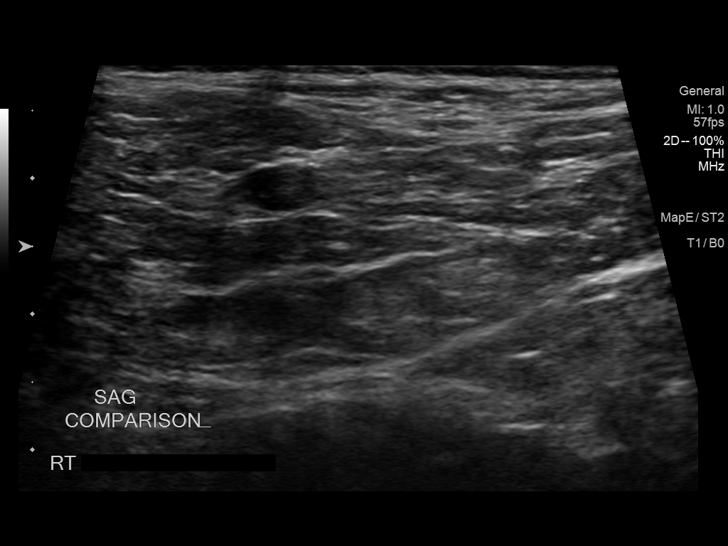
[im 15/36]
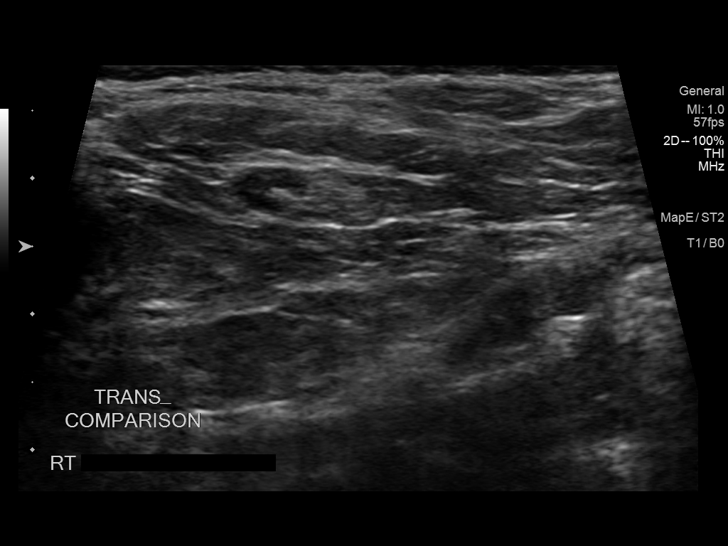
[im 18/36]
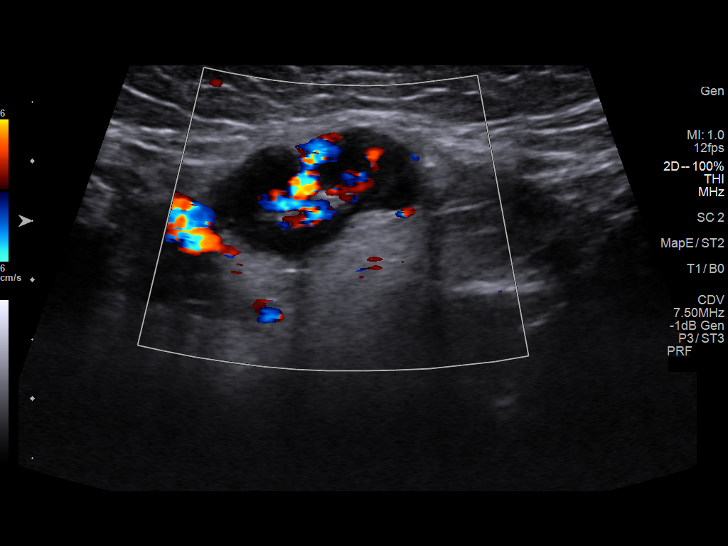
[im 21/36]
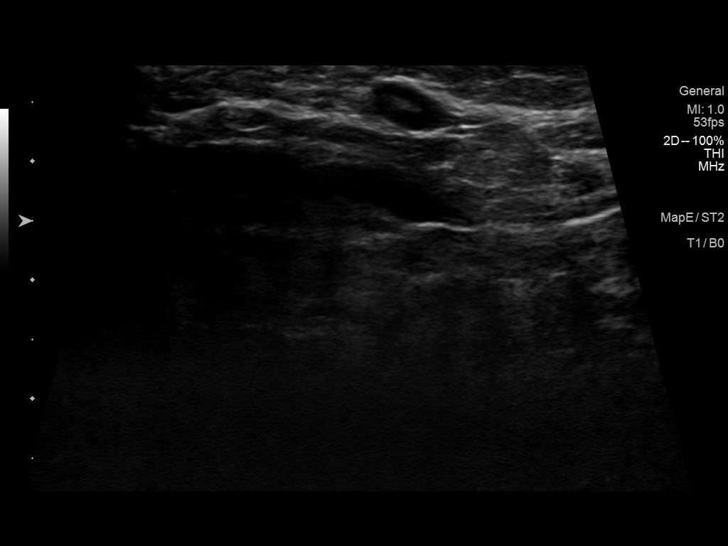
[im 24/36]
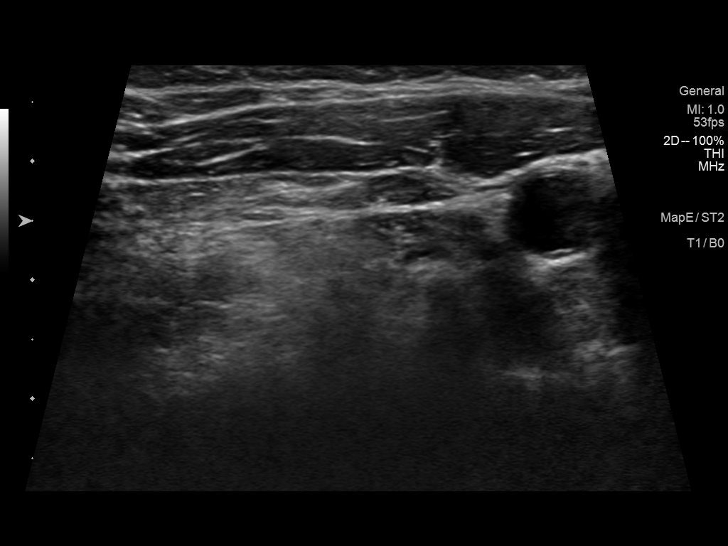
[im 27/36]
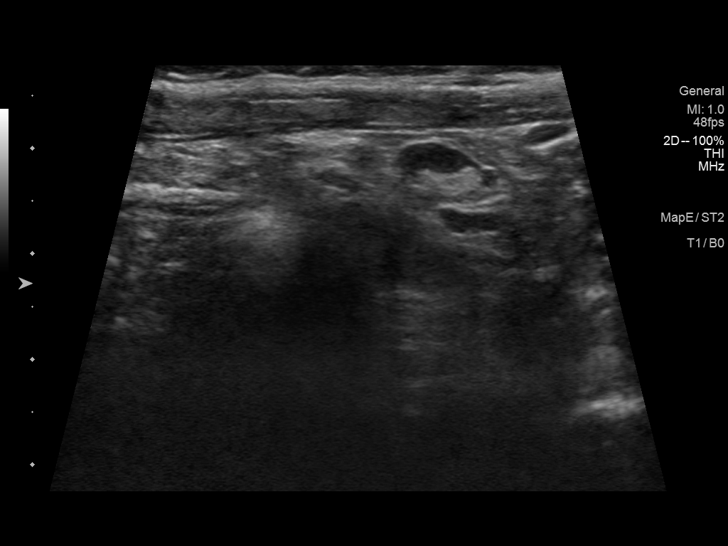
[im 30/36]
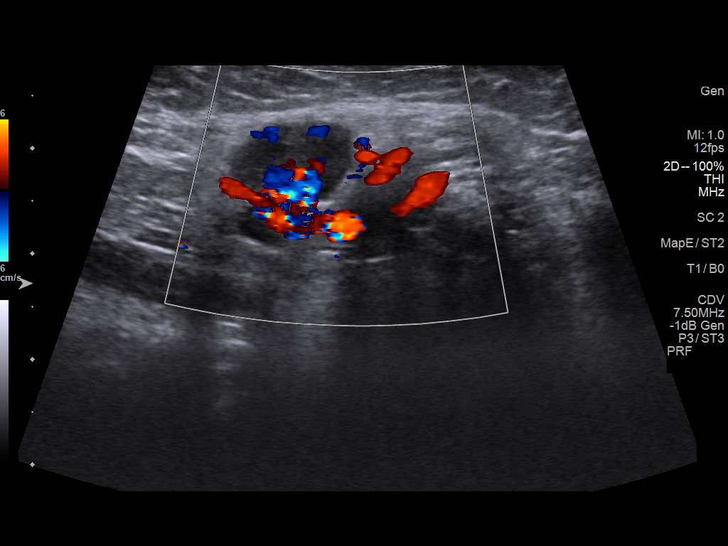
[im 33/36]
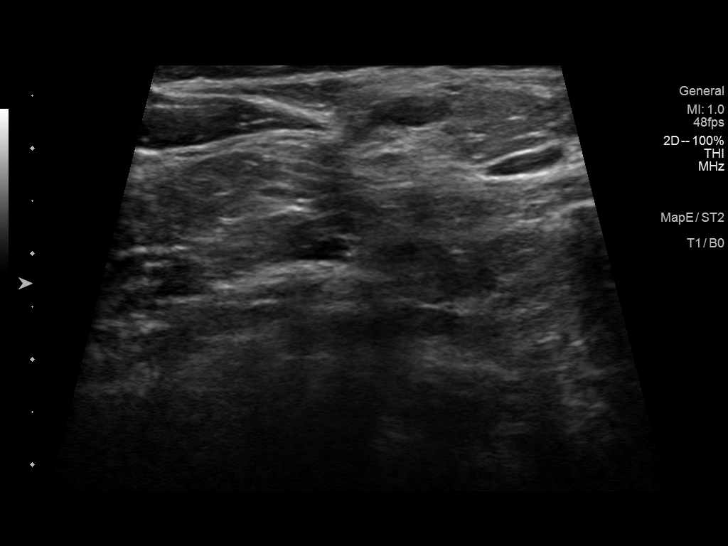
[im 36/36]
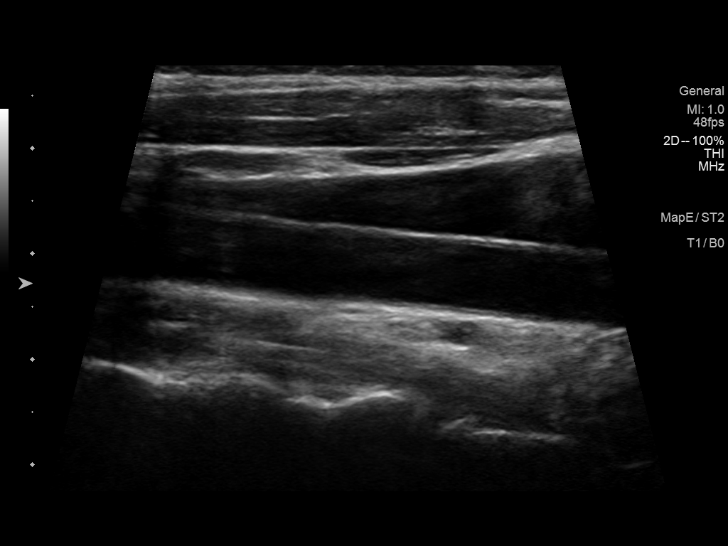

[13 of 25 positions shown; findings below may reference images not displayed]

FINDINGS: Sonographic evaluation of the patient's palpable area of concern
involving the left supraclavicular fossa correlates with several
punctate benign-appearing subcutaneous lymph nodes. Dominant left
supraclavicular lymph node is not enlarged by size criteria
measuring 0.3 cm in greatest short axis diameter and maintains a
benign fatty hilum. Otherwise, there is no sonographic correlate for
patient's palpable area of concern.

Sonographic evaluation of the patient's area of discomfort involving
the submandibular region of the right neck correlates with a
prominent though non pathologically enlarged submandibular lymph
node. The lymph node is not enlarged by size criteria measuring
cm in greatest short axis diameter and maintains a benign fatty
hilum (image 21). Otherwise, there is no sonographic correlate for
patient's palpable area of concern.

Note, a similar appearing left submandibular lymph node is seen
within the contralateral left neck with index lymph node measuring
0.7 cm and maintaining a benign fatty hilum (image 32).
IMPRESSION: 1. Mildly prominent though non pathologically enlarged and
symmetrically sized bilateral submandibular lymph nodes are
nonspecific though presumably reactive in etiology. Clinical
correlation to resolution is advised.
2. Benign-appearing non pathologically enlarged left supraclavicular
lymph node correlates with the patient's palpable area of concern
and are also favored to be reactive in etiology.

## 2021-01-01 ENCOUNTER — Encounter: Payer: Managed Care, Other (non HMO) | Admitting: Family Medicine

## 2021-01-09 ENCOUNTER — Encounter: Payer: Self-pay | Admitting: Family Medicine

## 2021-03-14 IMAGING — CR DG FOOT COMPLETE 3+V*L*
3 series · 3 of 3 positions shown · non-contrast
Comparison: None.

CLINICAL DATA: Left foot pain after fall last night.

EXAM:
LEFT FOOT - COMPLETE 3+ VIEW

[foot ap]
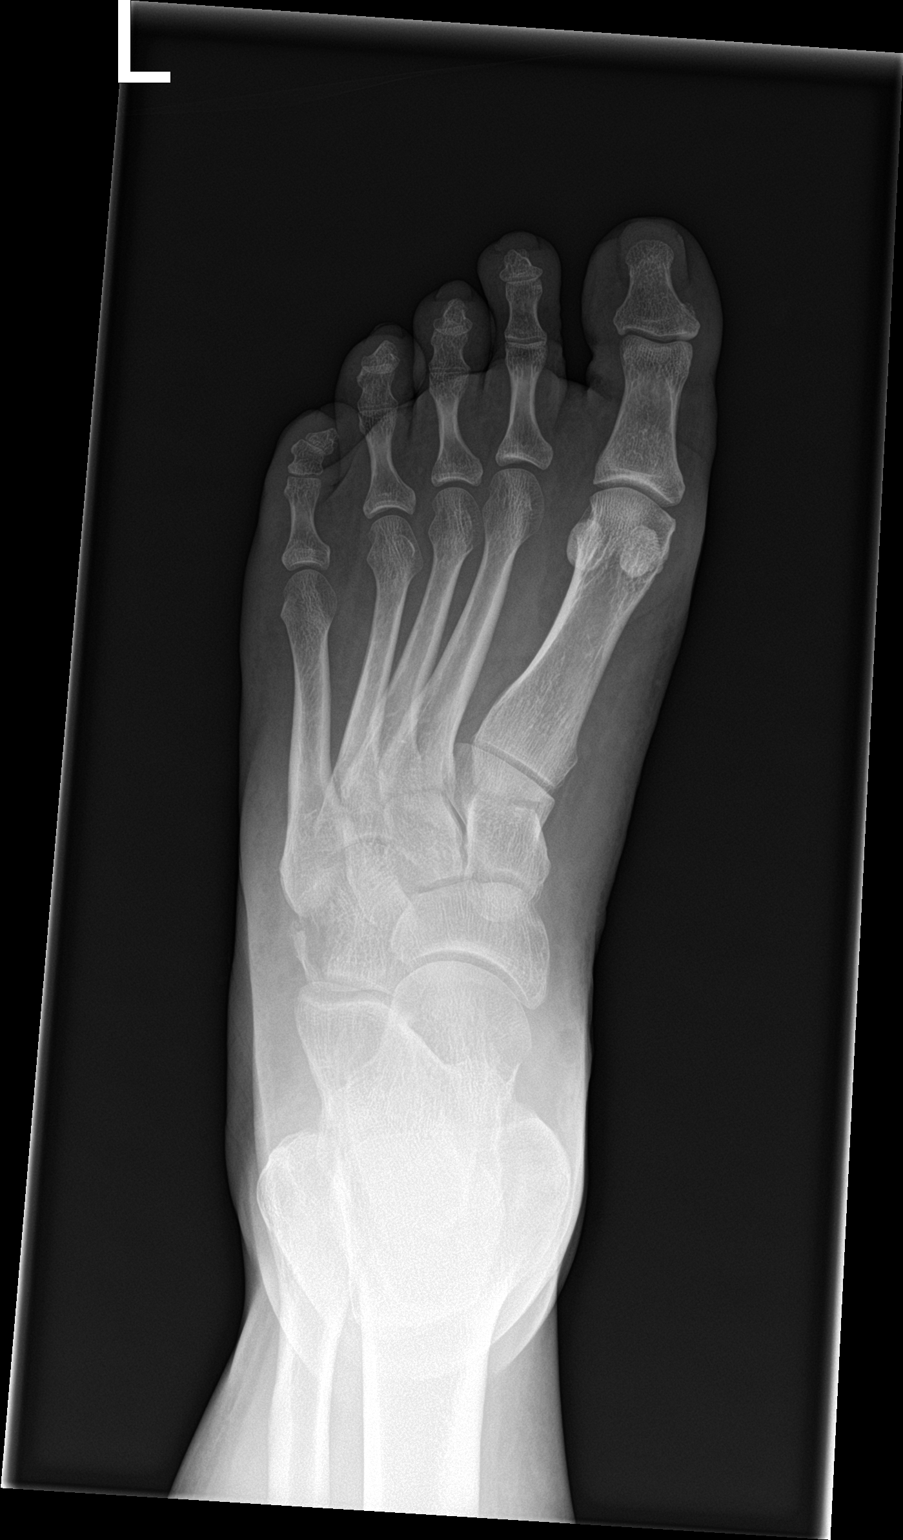

[foot obl]
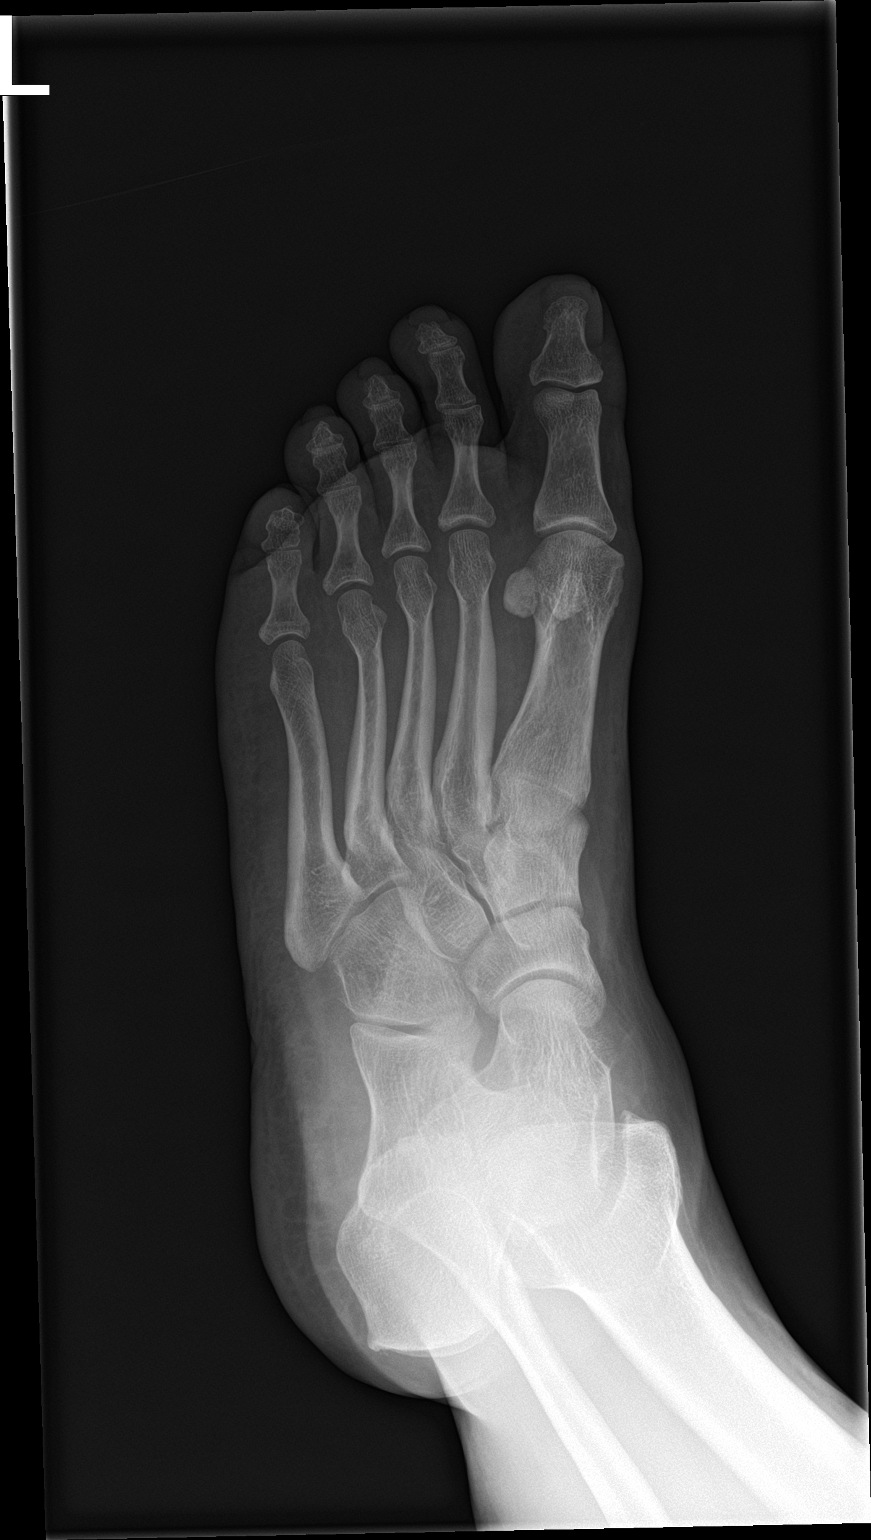

[foot lat]
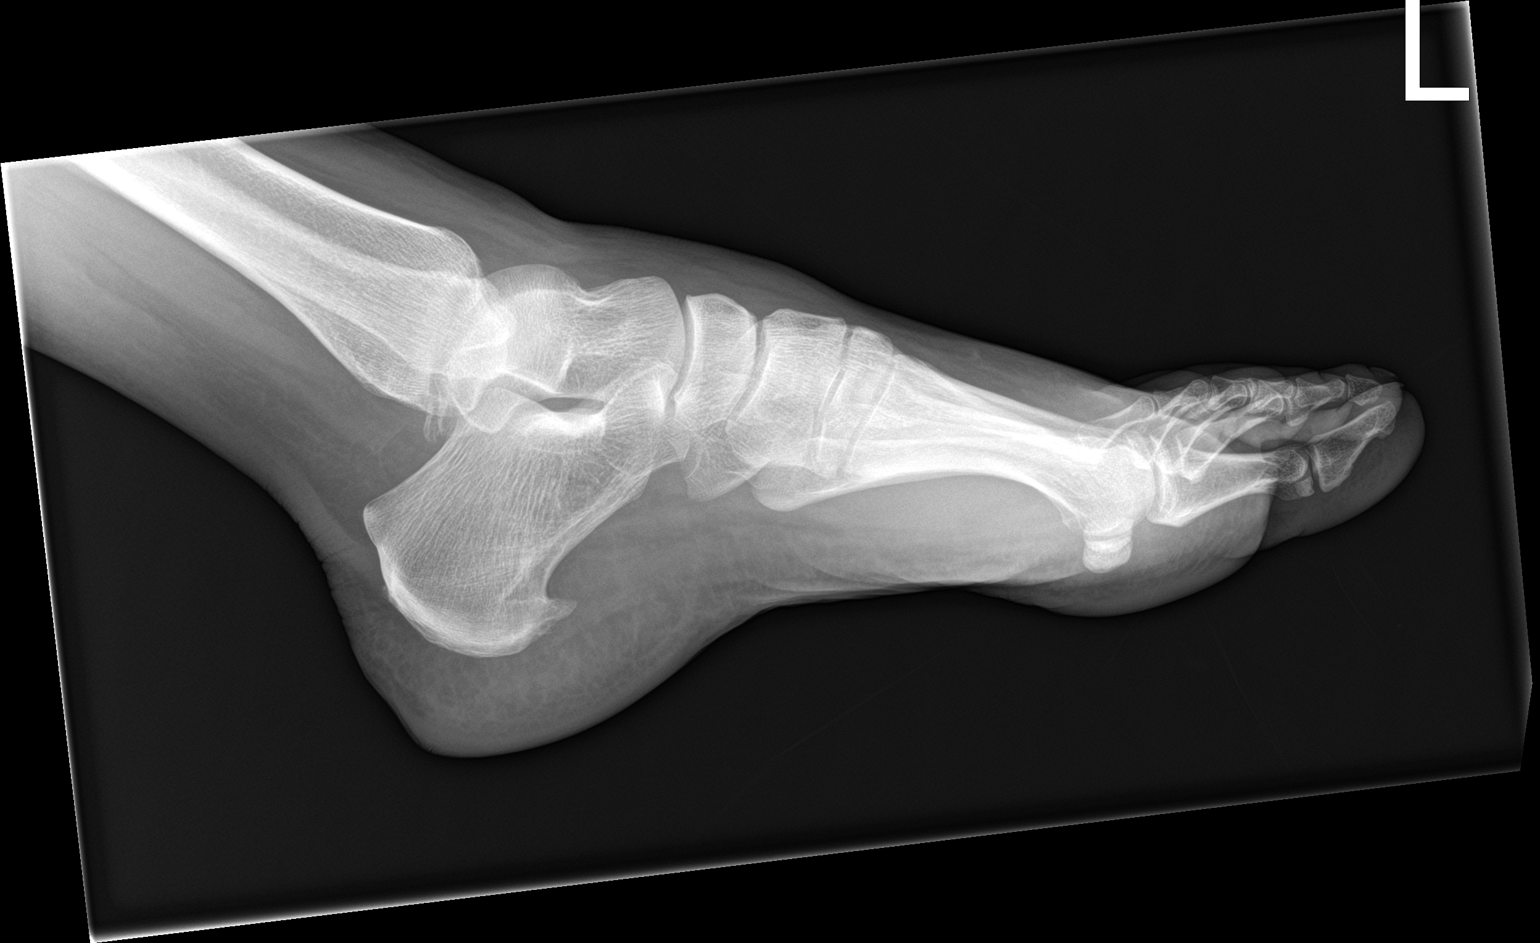

[3 of 3 positions shown; findings below may reference images not displayed]

FINDINGS: There is no evidence of fracture or dislocation. There is no
evidence of arthropathy or other focal bone abnormality. Soft
tissues are unremarkable.
IMPRESSION: Negative.

## 2021-08-12 DIAGNOSIS — R7989 Other specified abnormal findings of blood chemistry: Secondary | ICD-10-CM

## 2021-08-12 DIAGNOSIS — D649 Anemia, unspecified: Secondary | ICD-10-CM

## 2021-08-12 DIAGNOSIS — E569 Vitamin deficiency, unspecified: Secondary | ICD-10-CM

## 2021-08-12 HISTORY — DX: Anemia, unspecified: D64.9

## 2021-08-12 HISTORY — DX: Vitamin deficiency, unspecified: E56.9

## 2021-08-12 HISTORY — DX: Other specified abnormal findings of blood chemistry: R79.89

## 2022-01-30 ENCOUNTER — Ambulatory Visit (INDEPENDENT_AMBULATORY_CARE_PROVIDER_SITE_OTHER): Payer: Managed Care, Other (non HMO) | Admitting: Family Medicine

## 2022-01-30 ENCOUNTER — Encounter: Payer: Self-pay | Admitting: Family Medicine

## 2022-01-30 VITALS — BP 110/66 | HR 104 | Temp 97.6°F | Ht 67.0 in | Wt 186.0 lb

## 2022-01-30 DIAGNOSIS — R5383 Other fatigue: Secondary | ICD-10-CM | POA: Diagnosis not present

## 2022-01-30 DIAGNOSIS — R300 Dysuria: Secondary | ICD-10-CM | POA: Diagnosis not present

## 2022-01-30 DIAGNOSIS — D500 Iron deficiency anemia secondary to blood loss (chronic): Secondary | ICD-10-CM

## 2022-01-30 DIAGNOSIS — R112 Nausea with vomiting, unspecified: Secondary | ICD-10-CM | POA: Diagnosis not present

## 2022-01-30 DIAGNOSIS — E559 Vitamin D deficiency, unspecified: Secondary | ICD-10-CM

## 2022-01-30 DIAGNOSIS — R634 Abnormal weight loss: Secondary | ICD-10-CM | POA: Diagnosis not present

## 2022-01-30 DIAGNOSIS — N920 Excessive and frequent menstruation with regular cycle: Secondary | ICD-10-CM

## 2022-01-30 DIAGNOSIS — F1020 Alcohol dependence, uncomplicated: Secondary | ICD-10-CM

## 2022-01-30 LAB — IBC + FERRITIN
Ferritin: 18.6 ng/mL (ref 10.0–291.0)
Iron: 35 ug/dL — ABNORMAL LOW (ref 42–145)
Saturation Ratios: 12.7 % — ABNORMAL LOW (ref 20.0–50.0)
TIBC: 275.8 ug/dL (ref 250.0–450.0)
Transferrin: 197 mg/dL — ABNORMAL LOW (ref 212.0–360.0)

## 2022-01-30 LAB — COMPREHENSIVE METABOLIC PANEL
ALT: 54 U/L — ABNORMAL HIGH (ref 0–35)
AST: 162 U/L — ABNORMAL HIGH (ref 0–37)
Albumin: 3.3 g/dL — ABNORMAL LOW (ref 3.5–5.2)
Alkaline Phosphatase: 89 U/L (ref 39–117)
BUN: 7 mg/dL (ref 6–23)
CO2: 28 mEq/L (ref 19–32)
Calcium: 8.8 mg/dL (ref 8.4–10.5)
Chloride: 97 mEq/L (ref 96–112)
Creatinine, Ser: 0.56 mg/dL (ref 0.40–1.20)
GFR: 114.07 mL/min (ref >60.00)
Glucose, Bld: 122 mg/dL — ABNORMAL HIGH (ref 70–99)
Potassium: 2.9 mEq/L — ABNORMAL LOW (ref 3.5–5.1)
Sodium: 138 mEq/L (ref 135–145)
Total Bilirubin: 2 mg/dL — ABNORMAL HIGH (ref 0.2–1.2)
Total Protein: 6.7 g/dL (ref 6.0–8.3)

## 2022-01-30 LAB — POCT URINALYSIS DIPSTICK
Bilirubin, UA: POSITIVE
Blood, UA: NEGATIVE
Glucose, UA: NEGATIVE
Ketones, UA: POSITIVE
Nitrite, UA: POSITIVE
Protein, UA: POSITIVE — AB
Spec Grav, UA: 1.025 (ref 1.010–1.025)
Urobilinogen, UA: 2 E.U./dL — AB
pH, UA: 6 (ref 5.0–8.0)

## 2022-01-30 LAB — CBC WITH DIFFERENTIAL/PLATELET
Basophils Absolute: 0.1 10*3/uL (ref 0.0–0.1)
Basophils Relative: 0.6 % (ref 0.0–3.0)
Eosinophils Absolute: 0.1 10*3/uL (ref 0.0–0.7)
Eosinophils Relative: 0.8 % (ref 0.0–5.0)
HCT: 25.7 % — ABNORMAL LOW (ref 36.0–46.0)
Hemoglobin: 8.1 g/dL — ABNORMAL LOW (ref 12.0–15.0)
Lymphocytes Relative: 14.6 % (ref 12.0–46.0)
Lymphs Abs: 1.3 10*3/uL (ref 0.7–4.0)
MCHC: 31.6 g/dL (ref 30.0–36.0)
MCV: 105 fl — ABNORMAL HIGH (ref 78.0–100.0)
Monocytes Absolute: 0.8 10*3/uL (ref 0.1–1.0)
Monocytes Relative: 9.2 % (ref 3.0–12.0)
Neutro Abs: 6.6 10*3/uL (ref 1.4–7.7)
Neutrophils Relative %: 74.8 % (ref 43.0–77.0)
Platelets: 523 10*3/uL — ABNORMAL HIGH (ref 150.0–400.0)
RBC: 2.45 Mil/uL — ABNORMAL LOW (ref 3.87–5.11)
RDW: 24.2 % — ABNORMAL HIGH (ref 11.5–15.5)
WBC: 8.8 10*3/uL (ref 4.0–10.5)

## 2022-01-30 LAB — AMYLASE: Amylase: 13 U/L — ABNORMAL LOW (ref 27–131)

## 2022-01-30 LAB — TSH: TSH: 2.76 u[IU]/mL (ref 0.35–5.50)

## 2022-01-30 LAB — LIPASE: Lipase: 56 U/L (ref 11.0–59.0)

## 2022-01-30 LAB — VITAMIN B12: Vitamin B-12: 206 pg/mL — ABNORMAL LOW (ref 211–911)

## 2022-01-30 LAB — VITAMIN D 25 HYDROXY (VIT D DEFICIENCY, FRACTURES): VITD: 18.93 ng/mL — ABNORMAL LOW (ref 30.00–100.00)

## 2022-01-30 MED ORDER — POTASSIUM CHLORIDE CRYS ER 20 MEQ PO TBCR: 20.0000 meq | EXTENDED_RELEASE_TABLET | Freq: Two times a day (BID) | ORAL | 1 refills | Status: DC

## 2022-01-30 MED ORDER — CEPHALEXIN 500 MG PO CAPS: 500.0000 mg | ORAL_CAPSULE | Freq: Two times a day (BID) | ORAL | 0 refills | Status: DC

## 2022-01-30 MED ORDER — VITAMIN D (ERGOCALCIFEROL) 1.25 MG (50000 UNIT) PO CAPS: 50000.0000 [IU] | ORAL_CAPSULE | ORAL | 0 refills | Status: DC

## 2022-01-30 MED ORDER — ONDANSETRON 4 MG PO TBDP: 4.0000 mg | ORAL_TABLET | Freq: Three times a day (TID) | ORAL | 1 refills | Status: DC | PRN

## 2022-01-30 NOTE — Patient Instructions (Signed)
Welcome to [facility]! It was a pleasure meeting you today.  As discussed, Please schedule a1 wk follow up visit today.  PLEASE NOTE:  If you had any LAB tests please let us know if you have not heard back within a few days. You may see your results on MyChart before we have a chance to review them but we will give you a call once they are reviewed by [place]. If we ordered any REFERRALS today, please let us know if you have not heard from their office within the next week.  Let us know through MyChart if you are needing REFILLS, or have your pharmacy send [place] the request. You can also use MyChart to communicate with me or any office staff.  Please try these tips to maintain a healthy lifestyle:  Eat most of your calories during the day when you are active. Eliminate processed foods including packaged sweets (pies, cakes, cookies), reduce intake of potatoes, white bread, white pasta, and white rice. Look for whole grain options, oat flour or almond flour.  Each meal should contain half fruits/vegetables, one quarter protein, and one quarter carbs (no bigger than a computer mouse).  Cut down on sweet beverages. This includes juice, soda, and sweet tea. Also watch fruit intake, though this is a healthier sweet option, it still contains natural sugar! Limit to 3 servings daily.  Drink at least 1 glass of water with each meal and aim for at least 8 glasses per day  Exercise at least 150 minutes every week.

## 2022-01-30 NOTE — Progress Notes (Signed)
Subjective:     Patient ID: Janice Arnold, female    DOB: 11-07-1980, 41 y.o.   MRN: [id number]  Chief Complaint  Patient presents with   Transfer of Care   Emesis    Happens every 4-6 weeks for over a year    Burning with urination    Started 2 days ago, feels like she need to urinate and then nothing happens    HPI  Edema-occ - eats a lot of salt so uses otc "water away" 2x/wk.  Occ  takes husband's hctz.  Since covid, nothing tastes so uses salt.   Wt loss-used to be >400#.  Since 2019 has been losing.  Not trying now.  Started out by cutting carbs so lost 100#.  Got Covid 3x.  First was 2020.  Lost taste and not eating much.  Vomiting in cycles past 51mo     Does drink ETOH vodka 3-5/day for sev yrs.  Episodes-Will decrease appetite more for 1-2 days and nausea, then will vomit 7-8x/d(tries to drink fluids) but continues to vomit that up-lasts 2-5 days, then very weak for 1 wk, then recovers.   Last episode 2 wks ago(still weak now). Gets very dizzy and shakey,     no ETOH when vomiting.   Occ tr blood in vomit when on episode 30ish. No coffee ground emesis. No diarrhea.   Recurs 4-6 wks.  No dark stools.  Menses heavy and q 3 wks.  Past 6 mo. Tampon and pad q 2 hrs.     Bm q 3 days.  Occ senekot.  2 days dysuria, sm amts. +urgency.  LMP 2 wks ago  Health Maintenance Due  Topic Date Due   Hepatitis C Screening  Never done   PAP SMEAR-Modifier  Never done   MAMMOGRAM  04/29/2020    History reviewed. No pertinent past medical history.  History reviewed. No pertinent surgical history.  Outpatient Medications Prior to Visit  Medication Sig Dispense Refill   naproxen (NAPROSYN) 500 MG tablet Take 1 tablet (500 mg total) by mouth 2 (two) times daily with a meal. (Patient not taking: Reported on 10/06/2019) 30 tablet 3   Vitamin D, Ergocalciferol, (DRISDOL) 1.25 MG (50000 UNIT) CAPS capsule TAKE 1 CAPSULE BY MOUTH ONCE A WEEK FOR 12 WEEKS. THEN TAKE 4000IU/DAY (Patient not taking:  Reported on 01/30/2022) 12 capsule 0   No facility-administered medications prior to visit.    No Known Allergies ROS neg/noncontributory except as noted HPI/below No HA In general, fatigue when walking. And can be sob.  Some dizziness. No fever No cp.  Murmur since child  Some depression since works from home.  No SI.   + pica     Objective:     BP 110/66   Pulse (!) 104   Temp 97.6 F (36.4 C) (Temporal)   Ht '5\' 7"'$  (1.702 m)   Wt 186 lb (84.4 kg)   LMP 01/21/2022 (Approximate)   SpO2 99%   BMI 29.13 kg/m  Wt Readings from Last 3 Encounters:  01/30/22 186 lb (84.4 kg)  02/26/20 227 lb (103 kg)  10/06/19 241 lb 9.6 oz (109.6 kg)    Physical Exam   Gen: WDWN NAD pale wf HEENT: NCAT, conjunctiva not injected, sclera nonicteric NECK:  supple, no thyromegaly, no nodes, no carotid bruits CARDIAC: tachyRRR, S1S2+,1/6 sys murmur. DP 2+B LUNGS: CTAB. No wheezes ABDOMEN:  BS+, soft, NTND, No HSM, no masses EXT:  no edema MSK: no gross abnormalities.  NEURO: A&O x3.  CN II-XII intact.  PSYCH: normal mood. Good eye contact  Spent 27mn w/pt.  Very complex.  Getting history, ordering tests, discussing plan     Assessment & Plan:   Problem List Items Addressed This Visit       Other   Vitamin D deficiency   Relevant Orders   VITAMIN D 25 Hydroxy (Vit-D Deficiency, Fractures) (Completed)   Other Visit Diagnoses     Nausea and vomiting, unspecified vomiting type    -  Primary   Relevant Medications   ondansetron (ZOFRAN-ODT) 4 MG disintegrating tablet   Other Relevant Orders   Comprehensive metabolic panel (Completed)   TSH (Completed)   CBC with Differential/Platelet (Completed)   IBC + Ferritin (Completed)   Vitamin B12 (Completed)   Amylase (Completed)   Lipase (Completed)   Other fatigue       Relevant Orders   Comprehensive metabolic panel (Completed)   TSH (Completed)   CBC with Differential/Platelet (Completed)   IBC + Ferritin (Completed)    Vitamin B12 (Completed)   Amylase (Completed)   Lipase (Completed)   Dysuria       Relevant Orders   Urine Culture   POCT urinalysis dipstick (Completed)   Weight loss       Relevant Orders   Comprehensive metabolic panel (Completed)   TSH (Completed)   CBC with Differential/Platelet (Completed)   IBC + Ferritin (Completed)   Vitamin B12 (Completed)   Urine Culture   POCT urinalysis dipstick (Completed)   Amylase (Completed)   Lipase (Completed)   Alcoholism ([place]       Menorrhagia with regular cycle         1.  Cyclic nausea and vomiting-last episode was 2 weeks ago and she is currently recovering.  Has a lot of weakness.  Not sure if this is partly due to alcoholism, history of COVID, psychogenic, other.  Will give prescription for Zofran 4 mg disintegrating tablets to use during episodes so hopefully she can keep fluids down.  Will check CBC, CMP, TSH, amylase, lipase.  Advised to contribute drinking fluids.  Stop drinking alcohol.  We will also get CT abdomen and pelvis. 2.  Fatigue-suspect that this is due to anemia both from heavy menses and possibly from vomiting.  We will check CBC, CMP, TSH, iron studies, B12.  I am highly suspicious for anemia and have already warned her that she may need possible transfusion if severe. 3.  Profound weight loss-patient has lost approximately 250 pounds over the past several years.  She is also having a lot of GI problems.  Will check labs including amylase lipase for possible pancreatitis.  Advised to stop drinking alcohol.  Check CT chest abdomen and pelvis. 4.  Vitamin D deficiency-Will check vitamin D level 5.  Dysuria-we will check UA and culture.  Start patient on Keflex 500 mg twice daily 6.  Menorrhagia-Will check CBC, iron studies, B12. 7.  Alcoholism-discussed risks.  Patient advised to stop.  F/u 1 wk  Meds ordered this encounter  Medications   ondansetron (ZOFRAN-ODT) 4 MG disintegrating tablet    Sig: Take 1 tablet (4 mg  total) by mouth every 8 (eight) hours as needed for nausea or vomiting.    Dispense:  20 tablet    Refill:  1   cephALEXin (KEFLEX) 500 MG capsule    Sig: Take 1 capsule (500 mg total) by mouth 2 (two) times daily for 7 days. Take for 7 days  Dispense:  14 capsule    Refill:  0    [place], MD

## 2022-01-31 LAB — URINE CULTURE
MICRO NUMBER:: 13553649
SPECIMEN QUALITY:: ADEQUATE

## 2022-02-01 ENCOUNTER — Encounter: Payer: Self-pay | Admitting: Gastroenterology

## 2022-02-06 ENCOUNTER — Ambulatory Visit (INDEPENDENT_AMBULATORY_CARE_PROVIDER_SITE_OTHER): Payer: Managed Care, Other (non HMO) | Admitting: Family Medicine

## 2022-02-06 ENCOUNTER — Encounter: Payer: Self-pay | Admitting: Family Medicine

## 2022-02-06 ENCOUNTER — Telehealth: Payer: Self-pay

## 2022-02-06 VITALS — BP 124/70 | HR 98 | Temp 97.8°F | Ht 67.0 in | Wt 189.4 lb

## 2022-02-06 DIAGNOSIS — D500 Iron deficiency anemia secondary to blood loss (chronic): Secondary | ICD-10-CM

## 2022-02-06 DIAGNOSIS — F4323 Adjustment disorder with mixed anxiety and depressed mood: Secondary | ICD-10-CM | POA: Diagnosis not present

## 2022-02-06 DIAGNOSIS — R600 Localized edema: Secondary | ICD-10-CM

## 2022-02-06 DIAGNOSIS — R7401 Elevation of levels of liver transaminase levels: Secondary | ICD-10-CM

## 2022-02-06 DIAGNOSIS — R112 Nausea with vomiting, unspecified: Secondary | ICD-10-CM

## 2022-02-06 LAB — CBC WITH DIFFERENTIAL/PLATELET
Basophils Absolute: 0.1 10*3/uL (ref 0.0–0.1)
Basophils Relative: 0.8 % (ref 0.0–3.0)
Eosinophils Absolute: 0.1 10*3/uL (ref 0.0–0.7)
Eosinophils Relative: 0.9 % (ref 0.0–5.0)
HCT: 25.4 % — ABNORMAL LOW (ref 36.0–46.0)
Hemoglobin: 7.8 g/dL — CL (ref 12.0–15.0)
Lymphocytes Relative: 23.1 % (ref 12.0–46.0)
Lymphs Abs: 1.7 10*3/uL (ref 0.7–4.0)
MCHC: 30.9 g/dL (ref 30.0–36.0)
MCV: 102.8 fl — ABNORMAL HIGH (ref 78.0–100.0)
Monocytes Absolute: 0.4 10*3/uL (ref 0.1–1.0)
Monocytes Relative: 5.6 % (ref 3.0–12.0)
Neutro Abs: 5.1 10*3/uL (ref 1.4–7.7)
Neutrophils Relative %: 69.6 % (ref 43.0–77.0)
Platelets: 361 10*3/uL (ref 150.0–400.0)
RBC: 2.47 Mil/uL — ABNORMAL LOW (ref 3.87–5.11)
RDW: 22.2 % — ABNORMAL HIGH (ref 11.5–15.5)
WBC: 7.3 10*3/uL (ref 4.0–10.5)

## 2022-02-06 LAB — COMPREHENSIVE METABOLIC PANEL WITH GFR
ALT: 39 U/L — ABNORMAL HIGH (ref 0–35)
AST: 96 U/L — ABNORMAL HIGH (ref 0–37)
Albumin: 3 g/dL — ABNORMAL LOW (ref 3.5–5.2)
Alkaline Phosphatase: 92 U/L (ref 39–117)
BUN: 5 mg/dL — ABNORMAL LOW (ref 6–23)
CO2: 25 meq/L (ref 19–32)
Calcium: 8.5 mg/dL (ref 8.4–10.5)
Chloride: 104 meq/L (ref 96–112)
Creatinine, Ser: 0.39 mg/dL — ABNORMAL LOW (ref 0.40–1.20)
GFR: 124.44 mL/min
Glucose, Bld: 81 mg/dL (ref 70–99)
Potassium: 4 meq/L (ref 3.5–5.1)
Sodium: 138 meq/L (ref 135–145)
Total Bilirubin: 1.3 mg/dL — ABNORMAL HIGH (ref 0.2–1.2)
Total Protein: 6.4 g/dL (ref 6.0–8.3)

## 2022-02-06 LAB — MAGNESIUM: Magnesium: 1.6 mg/dL (ref 1.5–2.5)

## 2022-02-06 MED ORDER — ESCITALOPRAM OXALATE 10 MG PO TABS: 10.0000 mg | ORAL_TABLET | Freq: Every day | ORAL | 1 refills | Status: DC

## 2022-02-06 MED ORDER — HYDROCHLOROTHIAZIDE 25 MG PO TABS: 25.0000 mg | ORAL_TABLET | Freq: Every day | ORAL | 3 refills | Status: DC

## 2022-02-06 NOTE — Telephone Encounter (Signed)
CRITICAL LAB:  Hemoglobin @ 7.8

## 2022-02-06 NOTE — Progress Notes (Signed)
Subjective:     Patient ID: Janice Arnold, female    DOB: 30-Aug-1980, 41 y.o.   MRN: [id number]  Chief Complaint  Patient presents with   Follow-up    1 week follow-up for moods and vomiting Getting better, still taking medication    HPI Nausea.  Vomited once this wk(on Monday, dank ETOH on Sunday).  Gets nausea still.  Needs K daily as gets cramps/weakness o/w.  taking  2/day.     ETOH none since Sunday.  CT sch 7/6 IDA-taking otc iron. Some mild constipation. Seeing GI 7/13.  Gyn 7/11 3.  Moods-anx/depressed.  No meds in past.  Using ETOH to cope.  No SI.  Never meds.    Health Maintenance Due  Topic Date Due   Hepatitis C Screening  Never done   PAP SMEAR-Modifier  Never done   MAMMOGRAM  04/29/2020    History reviewed. No pertinent past medical history.  History reviewed. No pertinent surgical history.  Outpatient Medications Prior to Visit  Medication Sig Dispense Refill   ondansetron (ZOFRAN-ODT) 4 MG disintegrating tablet Take 1 tablet (4 mg total) by mouth every 8 (eight) hours as needed for nausea or vomiting. 20 tablet 1   potassium chloride SA (KLOR-CON M) 20 MEQ tablet Take 1 tablet (20 mEq total) by mouth 2 (two) times daily. 30 tablet 1   Vitamin D, Ergocalciferol, (DRISDOL) 1.25 MG (50000 UNIT) CAPS capsule Take 1 capsule (50,000 Units total) by mouth every 7 (seven) days. 12 capsule 0   cephALEXin (KEFLEX) 500 MG capsule Take 1 capsule (500 mg total) by mouth 2 (two) times daily for 7 days. Take for 7 days 14 capsule 0   No facility-administered medications prior to visit.    No Known Allergies ROS neg/noncontributory except as noted HPI/below Starting to get edema-legs tight, feet big.   Urine much better after abx(even though cx neg)      Objective:     BP 124/70   Pulse 98   Temp 97.8 F (36.6 C) (Temporal)   Ht '5\' 7"'$  (1.702 m)   Wt 189 lb 6 oz (85.9 kg)   LMP 01/21/2022 (Approximate)   SpO2 99%   BMI 29.66 kg/m  Wt Readings from Last 3  Encounters:  02/06/22 189 lb 6 oz (85.9 kg)  01/30/22 186 lb (84.4 kg)  02/26/20 227 lb (103 kg)    Physical Exam   Gen: WDWN NAD WF pale HEENT: NCAT, conjunctiva not injected, sclera nonicteric NECK:  supple, no thyromegaly, no nodes, no carotid bruits CARDIAC: tachy RRR, S1S2+, 1/6 sys murmur. DP 2+B LUNGS: CTAB. No wheezes ABDOMEN:  BS+, soft, NTND, No HSM, no masses EXT:  tr edema MSK: no gross abnormalities.  NEURO: A&O x3.  CN II-XII intact.  PSYCH: normal mood. Good eye contact  Out of work till after CT  Spent 82mn reviewing labs, upcoming appts, plan. Adjusting meds     Assessment & Plan:   Problem List Items Addressed This Visit       Other   Transaminitis - Primary   Relevant Orders   Comprehensive metabolic panel (Completed)   Magnesium (Completed)   Hepatitis C Antibody   Other Visit Diagnoses     Iron deficiency anemia due to chronic blood loss       Relevant Orders   CBC with Differential/Platelet (Completed)   Adjustment disorder with mixed anxiety and depressed mood       Local edema  Nausea and vomiting, unspecified vomiting type         1.  Elevated LFTs-suspect from alcohol abuse, possible liver cirrhosis.  However may be other etiologies.  CT scan is scheduled for next week.  Patient is trying to decrease/stop alcohol use.  Will recheck LFTs, hep C. 2.  Iron deficiency anemia-suspect multiple etiologies of chronic blood loss (GI, GYN) patient is taking B12 and iron.  She is still very pale and tachycardic.  Will recheck CBC.  I advised to go to the emergency room if any frank bleeding, worsening symptoms. 3.  Nausea/vomiting-has improved.  Continue Zofran if needed for nausea/vomiting.  A lot may be due to alcohol abuse.  She is working on quitting.  If intractable, advised to go to the emergency room. 4.  Hypokalemia-on 2 tablets/day.  Will recheck levels, including magnesium. 5.  Edema-multifactorial.  Anemia, decreased albumin, poor  nutrition.  Await CAT scan next week.  Cautiously, will trial hydrochlorothiazide-we discussed that she may need more potassium replacement as she is already losing potassium and this will cause her to lose more.  If continues to worsen, emergency room.  I am concerned that she may be in liver failure. 6.  Anxiety/depression-has been untreated for many years.  I suspect that this may be why she drinks alcohol.  She is very aware that she really needs to stop.  Will start Lexapro 5 mg daily for 1 week then 10 mg daily. Declines counseling  Follow-up 1 month  Meds ordered this encounter  Medications   hydrochlorothiazide (HYDRODIURIL) 25 MG tablet    Sig: Take 1 tablet (25 mg total) by mouth daily.    Dispense:  90 tablet    Refill:  3   escitalopram (LEXAPRO) 10 MG tablet    Sig: Take 1 tablet (10 mg total) by mouth daily.    Dispense:  30 tablet    Refill:  1    Wellington Hampshire, MD

## 2022-02-06 NOTE — Patient Instructions (Addendum)
It was very nice to see you today!  Lexapro(escitalopram)-1/2 tab/day for 1 wk then whole tab.  Swelling -compression stockings, elevate legs. Avoid salt   PLEASE NOTE:  If you had any lab tests please let us know if you have not heard back within a few days. You may see your results on MyChart before we have a chance to review them but we will give you a call once they are reviewed by [place]. If we ordered any referrals today, please let us know if you have not heard from their office within the next week.   Please try these tips to maintain a healthy lifestyle:  Eat most of your calories during the day when you are active. Eliminate processed foods including packaged sweets (pies, cakes, cookies), reduce intake of potatoes, white bread, white pasta, and white rice. Look for whole grain options, oat flour or almond flour.  Each meal should contain half fruits/vegetables, one quarter protein, and one quarter carbs (no bigger than a computer mouse).  Cut down on sweet beverages. This includes juice, soda, and sweet tea. Also watch fruit intake, though this is a healthier sweet option, it still contains natural sugar! Limit to 3 servings daily.  Drink at least 1 glass of water with each meal and aim for at least 8 glasses per day  Exercise at least 150 minutes every week.

## 2022-02-07 ENCOUNTER — Other Ambulatory Visit: Payer: Self-pay | Admitting: Family Medicine

## 2022-02-07 ENCOUNTER — Other Ambulatory Visit: Payer: Self-pay | Admitting: *Deleted

## 2022-02-07 ENCOUNTER — Telehealth: Payer: Self-pay | Admitting: Family Medicine

## 2022-02-07 LAB — HEPATITIS C ANTIBODY: Hepatitis C Ab: NONREACTIVE

## 2022-02-07 MED ORDER — POTASSIUM CHLORIDE CRYS ER 20 MEQ PO TBCR: 20.0000 meq | EXTENDED_RELEASE_TABLET | Freq: Two times a day (BID) | ORAL | 1 refills | Status: DC

## 2022-02-07 NOTE — Telephone Encounter (Signed)
Pleased advise.

## 2022-02-07 NOTE — Telephone Encounter (Signed)
Patient called stating her hemoglobin has been running low for the past  weeks and it is not getting any better - she wants to be refereed out to [facility]. Julien Nordmann, MD for further evaluation -

## 2022-02-08 ENCOUNTER — Other Ambulatory Visit: Payer: Self-pay | Admitting: *Deleted

## 2022-02-08 ENCOUNTER — Encounter: Payer: Managed Care, Other (non HMO) | Admitting: Family Medicine

## 2022-02-08 DIAGNOSIS — D649 Anemia, unspecified: Secondary | ICD-10-CM

## 2022-02-08 NOTE — Telephone Encounter (Signed)
Patient notified and verbalized understanding. Referral placed.

## 2022-02-11 ENCOUNTER — Other Ambulatory Visit: Payer: Self-pay | Admitting: Family

## 2022-02-11 ENCOUNTER — Encounter: Payer: Self-pay | Admitting: Family

## 2022-02-11 ENCOUNTER — Inpatient Hospital Stay (HOSPITAL_BASED_OUTPATIENT_CLINIC_OR_DEPARTMENT_OTHER): Payer: Managed Care, Other (non HMO) | Admitting: Family

## 2022-02-11 ENCOUNTER — Inpatient Hospital Stay: Payer: Managed Care, Other (non HMO) | Attending: Hematology & Oncology

## 2022-02-11 VITALS — BP 115/75 | HR 79 | Temp 98.3°F | Resp 17 | Ht 66.25 in | Wt 177.0 lb

## 2022-02-11 DIAGNOSIS — E538 Deficiency of other specified B group vitamins: Secondary | ICD-10-CM | POA: Diagnosis not present

## 2022-02-11 DIAGNOSIS — D509 Iron deficiency anemia, unspecified: Secondary | ICD-10-CM

## 2022-02-11 DIAGNOSIS — D649 Anemia, unspecified: Secondary | ICD-10-CM

## 2022-02-11 DIAGNOSIS — D500 Iron deficiency anemia secondary to blood loss (chronic): Secondary | ICD-10-CM | POA: Insufficient documentation

## 2022-02-11 LAB — CBC WITH DIFFERENTIAL (CANCER CENTER ONLY)
Abs Immature Granulocytes: 0.13 10*3/uL — ABNORMAL HIGH (ref 0.00–0.07)
Basophils Absolute: 0.1 10*3/uL (ref 0.0–0.1)
Basophils Relative: 1 %
Eosinophils Absolute: 0.1 10*3/uL (ref 0.0–0.5)
Eosinophils Relative: 1 %
HCT: 28.3 % — ABNORMAL LOW (ref 36.0–46.0)
Hemoglobin: 8.2 g/dL — ABNORMAL LOW (ref 12.0–15.0)
Immature Granulocytes: 2 %
Lymphocytes Relative: 21 %
Lymphs Abs: 1.8 10*3/uL (ref 0.7–4.0)
MCH: 30.5 pg (ref 26.0–34.0)
MCHC: 29 g/dL — ABNORMAL LOW (ref 30.0–36.0)
MCV: 105.2 fL — ABNORMAL HIGH (ref 80.0–100.0)
Monocytes Absolute: 0.5 10*3/uL (ref 0.1–1.0)
Monocytes Relative: 6 %
Neutro Abs: 5.7 10*3/uL (ref 1.7–7.7)
Neutrophils Relative %: 69 %
Platelet Count: 247 10*3/uL (ref 150–400)
RBC: 2.69 MIL/uL — ABNORMAL LOW (ref 3.87–5.11)
RDW: 19 % — ABNORMAL HIGH (ref 11.5–15.5)
WBC Count: 8.2 10*3/uL (ref 4.0–10.5)
nRBC: 0 % (ref 0.0–0.2)

## 2022-02-11 LAB — CMP (CANCER CENTER ONLY)
ALT: 30 U/L (ref 0–44)
AST: 66 U/L — ABNORMAL HIGH (ref 15–41)
Albumin: 3.9 g/dL (ref 3.5–5.0)
Alkaline Phosphatase: 92 U/L (ref 38–126)
Anion gap: 9 (ref 5–15)
BUN: 9 mg/dL (ref 6–20)
CO2: 25 mmol/L (ref 22–32)
Calcium: 9.7 mg/dL (ref 8.9–10.3)
Chloride: 104 mmol/L (ref 98–111)
Creatinine: 0.51 mg/dL (ref 0.44–1.00)
GFR, Estimated: >60 mL/min (ref >60)
Glucose, Bld: 95 mg/dL (ref 70–99)
Potassium: 3.7 mmol/L (ref 3.5–5.1)
Sodium: 138 mmol/L (ref 135–145)
Total Bilirubin: 1.4 mg/dL — ABNORMAL HIGH (ref 0.3–1.2)
Total Protein: 7.4 g/dL (ref 6.5–8.1)

## 2022-02-11 LAB — SAMPLE TO BLOOD BANK

## 2022-02-11 LAB — RETICULOCYTES
Immature Retic Fract: 34.4 % — ABNORMAL HIGH (ref 2.3–15.9)
RBC.: 2.69 MIL/uL — ABNORMAL LOW (ref 3.87–5.11)
Retic Count, Absolute: 166.8 10*3/uL (ref 19.0–186.0)
Retic Ct Pct: 6.2 % — ABNORMAL HIGH (ref 0.4–3.1)

## 2022-02-11 LAB — IRON AND IRON BINDING CAPACITY (CC-WL,HP ONLY)
Iron: 27 ug/dL — ABNORMAL LOW (ref 28–170)
Saturation Ratios: 7 % — ABNORMAL LOW (ref 10.4–31.8)
TIBC: 368 ug/dL (ref 250–450)
UIBC: 341 ug/dL (ref 148–442)

## 2022-02-11 LAB — FERRITIN: Ferritin: 27 ng/mL (ref 11–307)

## 2022-02-11 LAB — SAVE SMEAR(SSMR), FOR PROVIDER SLIDE REVIEW

## 2022-02-11 LAB — LACTATE DEHYDROGENASE: LDH: 99 U/L (ref 98–192)

## 2022-02-11 NOTE — Progress Notes (Signed)
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 Kenan is a very pleasant 41 yo caucasian female with significant anemia noted over the last month.  Despite having an IUD in place she has an extremely heavy cycles that occurs every 3 weeks lasting 10 days at a time.  She has not noted any other obvious blood loss. No bruising or petechiae.  She states that she is scheduled to see both GI and gynecology next week for further work up.  She was previously drinking 8-12 shots of Vodka per day. She states that after her visit with her PCP last week she has stopped completely. We are so proud of her for making this choice. We did discuss the negative effects that alcohol can have on the bone marrow and cell production.  She is symptomatic with fatigue, weakness, palpitations, dizziness, SOB, chills, craving/chewing ice and insomnia.   No known familial history of anemia.  She is B 12 deficiency as well and started taking sublingual B 12 2,000 mcg daily.  She has no appetite but feels that she is staying well hydrated. She notes persistent abdominal bloating.  She states that she started her weight loss journey several years ago at around 400 lbs. She ate low carb and lost over 100 lbs to start. She has had 3 bouts with Covid and lost her taste and appetite. Since then she has continued to lose weight.  She has been having cyclic n/v occurring every 5-6 weeks. This has been going on for over a year now. She will have 10-15 episodes a day for 4-5 days at a time.  She is scheduled for CT of chest, abdomen and pelvis later this week on 02/14/2022.  She states that she has never had surgery.  She has 3 children and history of 1 miscarriage  within the first 8 weeks of pregnancy.  No history of diabetes or thyroid disease.  She had her mammogram back in March 2021 showed no evidence of malignancy. She had what was felt to be benign right breast calcifications and fibrocystic changed.  She has noted some swelling in her lower extremities over the last month and recently started HCTZ with her PCP.  No tenderness, numbness or tingling in her extremities.  No falls or syncope reported.  No smoking or recreational drug use.  She works as a Government social research officer for [employer].  She was walking for exercise but has not been able to recently due to her symptoms with anemia.   ROS: All other 10 point review of systems is negative.   PAST MEDICAL HISTORY:   No past medical history on file.  ALLERGIES: No Known Allergies    MEDICATIONS:  Current Outpatient Medications on File Prior to Visit  Medication Sig Dispense Refill   escitalopram (LEXAPRO) 10 MG tablet Take 1 tablet (10 mg total) by mouth daily. 30 tablet 1   hydrochlorothiazide (HYDRODIURIL) 25 MG tablet Take 1 tablet (25 mg total) by mouth daily. 90 tablet 3   ondansetron (ZOFRAN-ODT) 4 MG disintegrating tablet Take 1 tablet (4 mg total) by mouth every 8 (eight) hours as needed for nausea or vomiting. 20 tablet 1   potassium  chloride SA (KLOR-CON M) 20 MEQ tablet Take 1 tablet (20 mEq total) by mouth 2 (two) times daily. 60 tablet 1   Vitamin D, Ergocalciferol, (DRISDOL) 1.25 MG (50000 UNIT) CAPS capsule Take 1 capsule (50,000 Units total) by mouth every 7 (seven) days. 12 capsule 0   No current facility-administered medications on file prior to visit.     PAST SURGICAL HISTORY No past surgical history on file.  FAMILY HISTORY: Family History  Problem Relation Age of Onset   Breast cancer Mother    Cervical cancer Mother    Breast cancer Maternal Grandmother     SOCIAL HISTORY:  reports that she has never smoked. She has never used smokeless tobacco. She reports  current alcohol use of about 21.0 standard drinks of alcohol per week. She reports that she does not use drugs.  PERFORMANCE STATUS: The patient's performance status is 1 - Symptomatic but completely ambulatory  PHYSICAL EXAM: Most Recent Vital Signs: Last menstrual period 01/21/2022. BP 115/75 (BP Location: Left Arm, Patient Position: Sitting)   Pulse 79   Temp 98.3 F (36.8 C) (Oral)   Resp 17   Ht 5' 6.25" (1.683 m)   Wt 177 lb 0.6 oz (80.3 kg)   LMP 01/21/2022 (Approximate)   SpO2 100%   BMI 28.36 kg/m   General Appearance:    Alert, cooperative, no distress, appears stated age  Head:    Normocephalic, without obvious abnormality, atraumatic  Eyes:    PERRL, conjunctiva/corneas clear, EOM's intact, fundi    benign, both eyes        Throat:   Lips, mucosa, and tongue normal; teeth and gums normal  Neck:   Supple, symmetrical, trachea midline, no adenopathy;    thyroid:  no enlargement/tenderness/nodules; no carotid   bruit or JVD  Back:     Symmetric, no curvature, ROM normal, no CVA tenderness  Lungs:     Clear to auscultation bilaterally, respirations unlabored  Chest Wall:    No tenderness or deformity   Heart:    Regular rate and rhythm, S1 and S2 normal, no murmur, rub   or gallop     Abdomen:     Soft, non-tender, bowel sounds active all four quadrants,    no masses, no organomegaly        Extremities:   Extremities normal, atraumatic, no cyanosis or edema  Pulses:   2+ and symmetric all extremities  Skin:   Skin color, texture, turgor normal, no rashes or lesions  Lymph nodes:   Cervical, supraclavicular, and axillary nodes normal  Neurologic:   CNII-XII intact, normal strength, sensation and reflexes    throughout    LABORATORY DATA:  Results for orders placed or performed in visit on 02/11/22 (from the past 48 hour(s))  CBC with Differential (Cancer Center Only)     Status: Abnormal (Preliminary result)   Collection Time: 02/11/22 11:03 AM  Result Value  Ref Range   WBC Count 8.2 4.0 - 10.5 K/uL   RBC 2.69 (L) 3.87 - 5.11 MIL/uL   Hemoglobin 8.2 (L) 12.0 - 15.0 g/dL   HCT 28.3 (L) 36.0 - 46.0 %   MCV 105.2 (H) 80.0 - 100.0 fL   MCH 30.5 26.0 - 34.0 pg   MCHC 29.0 (L) 30.0 - 36.0 g/dL   RDW 19.0 (H) 11.5 - 15.5 %   Platelet Count 247 150 - 400 K/uL   nRBC 0.0 0.0 - 0.2 %    Comment: Performed at [facility], [address], [place], [place] [address]   Neutrophils Relative % PENDING %   Neutro Abs PENDING 1.7 - 7.7 K/uL   Band Neutrophils PENDING %   Lymphocytes Relative PENDING %   Lymphs Abs PENDING 0.7 - 4.0 K/uL   Monocytes Relative PENDING %   Monocytes Absolute PENDING 0.1 - 1.0 K/uL   Eosinophils Relative PENDING %   Eosinophils Absolute PENDING 0.0 - 0.5 K/uL   Basophils Relative PENDING %   Basophils Absolute PENDING 0.0 - 0.1 K/uL   WBC Morphology PENDING    RBC Morphology PENDING    Smear Review PENDING    Other PENDING %   nRBC PENDING 0 /100 WBC   Metamyelocytes Relative PENDING %   Myelocytes PENDING %   Promyelocytes Relative PENDING %   Blasts PENDING %   Immature Granulocytes PENDING %   Abs Immature Granulocytes PENDING 0.00 - 0.07 K/uL  Reticulocytes     Status: Abnormal   Collection Time: 02/11/22 11:04 AM  Result Value Ref Range   Retic Ct Pct 6.2 (H) 0.4 - 3.1 %   RBC. 2.69 (L) 3.87 - 5.11 MIL/uL   Retic Count, Absolute 166.8 19.0 - 186.0 K/uL   Immature Retic Fract 34.4 (H) 2.3 - 15.9 %    Comment: Performed at [facility], [address], [place], [place] [address]      RADIOGRAPHY: No results found.     PATHOLOGY: None  ASSESSMENT/PLAN: Ms. Amstutz is a very pleasant 41 yo caucasian female with significant anemia noted over the last month. She was found to be B 12 and iron deficiency.  We will get her set up for IV iron replacement.  She has already started taking a B 12 sublingual supplement daily.  Blood  smear reviewed with Dr. Marin Olp indicative of iron and B 12 deficiency. She was noted to have a few stomatocytes as well which can be common with ETOH abuse.  She has appointment for CT scans this week as well as new patient apps with GI and Gyn next week.  Follow-up with our office in 6 weeks.   All questions were answered. The patient knows to call the clinic with any problems, questions or concerns. We can certainly see the patient much sooner if necessary.  The patient was discussed with Dr. Marin Olp and he is in agreement with the aforementioned.   Lottie Dawson, NP

## 2022-02-13 ENCOUNTER — Other Ambulatory Visit: Payer: Managed Care, Other (non HMO)

## 2022-02-14 ENCOUNTER — Encounter: Payer: Self-pay | Admitting: Family Medicine

## 2022-02-14 ENCOUNTER — Inpatient Hospital Stay: Admission: RE | Admit: 2022-02-14 | Payer: Managed Care, Other (non HMO) | Source: Ambulatory Visit

## 2022-02-14 ENCOUNTER — Telehealth: Payer: Self-pay | Admitting: Family Medicine

## 2022-02-14 NOTE — Telephone Encounter (Signed)
Pt states [facility] faxed over paperwork.  FO Rep could not confirm receipt, requested pt have company send again.   [employer] will make note when it arrives and give to the PCP Team

## 2022-02-15 ENCOUNTER — Ambulatory Visit: Payer: Managed Care, Other (non HMO)

## 2022-02-15 LAB — ERYTHROPOIETIN: Erythropoietin: 51.1 m[IU]/mL — ABNORMAL HIGH (ref 2.6–18.5)

## 2022-02-15 NOTE — Addendum Note (Signed)
Addended by: Wellington Hampshire on: 02/15/2022 05:11 PM   Modules accepted: Orders

## 2022-02-18 NOTE — Telephone Encounter (Signed)
I have gotten a referral coordinator to look into this.   The insurance company has denied, requesting a peer to peer.  Patient has cancelled a GI appt previously due to CT not being complete.  I have advised patient to keep GI appt and that Cherlynn Kaiser can look at completing a peer to peer with the insurance company once she is back in the office next week.

## 2022-02-19 ENCOUNTER — Inpatient Hospital Stay: Payer: Managed Care, Other (non HMO)

## 2022-02-19 ENCOUNTER — Telehealth: Payer: Self-pay | Admitting: Family Medicine

## 2022-02-19 VITALS — BP 116/64 | HR 60 | Temp 98.2°F | Resp 17

## 2022-02-19 DIAGNOSIS — D509 Iron deficiency anemia, unspecified: Secondary | ICD-10-CM | POA: Diagnosis not present

## 2022-02-19 DIAGNOSIS — D500 Iron deficiency anemia secondary to blood loss (chronic): Secondary | ICD-10-CM

## 2022-02-19 MED ORDER — SODIUM CHLORIDE 0.9 % IV SOLN
300.0000 mg | Freq: Once | INTRAVENOUS | Status: AC
  Administered 2022-02-19: 300 mg via INTRAVENOUS
  Filled 2022-02-19: qty 300

## 2022-02-19 MED ORDER — SODIUM CHLORIDE 0.9% FLUSH: 3.0000 mL | Freq: Once | INTRAVENOUS | Status: DC | PRN

## 2022-02-19 MED ORDER — SODIUM CHLORIDE 0.9 % IV SOLN: Freq: Once | INTRAVENOUS | Status: AC

## 2022-02-19 MED ORDER — SODIUM CHLORIDE 0.9% FLUSH: 10.0000 mL | Freq: Once | INTRAVENOUS | Status: DC | PRN

## 2022-02-19 NOTE — Patient Instructions (Signed)

## 2022-02-19 NOTE — Telephone Encounter (Signed)
.  Type of form received: Disability paperwork  Additional comments:   Received by: Adonis Brook  Form should be Faxed to: [phone]  Form should be mailed to:    Is patient requesting call for pickup:   Form placed:  In provider's box  Attach charge sheet. yes  Individual made aware of 3-5 business day turn around (Y/N)?

## 2022-02-20 NOTE — Telephone Encounter (Signed)
Spoke with patient and she stated that it is okay for form to be completed when PCP return, she would rather have Dr. Cherlynn Kaiser sign it. Patient stated that she would like call when form is completed and faxed.

## 2022-02-20 NOTE — Telephone Encounter (Signed)
Form placed on providers desk for completion

## 2022-02-21 ENCOUNTER — Other Ambulatory Visit (INDEPENDENT_AMBULATORY_CARE_PROVIDER_SITE_OTHER): Payer: Managed Care, Other (non HMO)

## 2022-02-21 ENCOUNTER — Encounter: Payer: Self-pay | Admitting: Gastroenterology

## 2022-02-21 ENCOUNTER — Ambulatory Visit (INDEPENDENT_AMBULATORY_CARE_PROVIDER_SITE_OTHER): Payer: Managed Care, Other (non HMO) | Admitting: Gastroenterology

## 2022-02-21 VITALS — BP 108/62 | HR 87 | Ht 67.0 in | Wt 181.8 lb

## 2022-02-21 DIAGNOSIS — D509 Iron deficiency anemia, unspecified: Secondary | ICD-10-CM

## 2022-02-21 DIAGNOSIS — R634 Abnormal weight loss: Secondary | ICD-10-CM

## 2022-02-21 DIAGNOSIS — E538 Deficiency of other specified B group vitamins: Secondary | ICD-10-CM

## 2022-02-21 DIAGNOSIS — R7401 Elevation of levels of liver transaminase levels: Secondary | ICD-10-CM

## 2022-02-21 DIAGNOSIS — R112 Nausea with vomiting, unspecified: Secondary | ICD-10-CM

## 2022-02-21 DIAGNOSIS — F1010 Alcohol abuse, uncomplicated: Secondary | ICD-10-CM

## 2022-02-21 LAB — H. PYLORI ANTIBODY, IGG: H Pylori IgG: NEGATIVE

## 2022-02-21 NOTE — Progress Notes (Signed)
HPI : Janice Arnold is a very pleasant 41 year old female with no previous medical history who is referred to [place] by Dr. Tawnya Crook for further evaluation of iron deficiency anemia, elevated liver enzymes and unintentional weight loss. The patient was seen by Dr. Cherlynn Kaiser on June 21 with complaints of episodic nausea and vomiting.  CBC at that visit was notable for a hemoglobin of 8.1 down from 14 in 2021.  MCV 105 (was 109 in 2021, but was 90 in 2011).  Platelets initially 523, but down to 361 a week later.  Iron panel consistent with mixed iron deficiency and B12 anemia (saturation 13%, ferritin 19, B12 206) She reports that for the past 18 months or so, she has had episodes of severe nausea and vomiting that occur every 4-6 weeks and last for 3-4 days.  She reports vomiting up to 10 times per day during these periods.  She has diffuse abdominal pain and no appetite.  Once these episodes resolve, she denies problems with nausea or vomiting, but does complain of baseline fatigue, bilateral lower abdominal pain and headaches.  She has new constipation, with bowel movements every 3-4 days with hard stools that are difficult to pass.  She denies problems with diarrhea.  No hematochezia or melena.  She used to have daily bowel movements with formed soft stools and no straining.  Bowel habits changed around 18 months ago as well.  She reports that she used to weight 400lbs.  Initially she said she weighed this in 2021, but records in [place] indicate she weighed 241lbs in Feb 2021, and 227lbs in July 221.  She then said she last weighed 400lbs in 2019.  She currently weighs 181 lbs.  She denies losing weight intentionally.  She was also noted to have elevated aminotransferases, AST predominant and mild tbili elevation.  AST 162, ALT 54, tbili 2.0 on June 21, improved to 96, 39 and 1.3 a week later.  Most recent LAEs on July 3 were 66, 30, 1.4. She has had swelling of the legs which is new, but denies swelling  of the abdomen, yellowing of the eyes or skin, or dark colored urine.  No head fog or confusion.  A CT scan was ordered by Dr. Cherlynn Kaiser, but the patient indicated that it looked like insurance was not going to cover it, so she did not go through with it.  She admits to heavy alcohol use, drinking about 8 shots of vodka daily for years.   She stopped drinking completely when she found out her liver enzymes were elevated 3 weeks ago.  She did not experience any withdrawal symptoms and denies cravings.  She reports that her menses have been very heavy for the past 10-12 months, lasting 7-10 days, wearing a pad and a tampon and having to change every 2 hours. She saw Gynecology and they are planning to switch her IUD to see if this improves her menorrhagia. She received her first dose of IV iron earlier this week and is scheduled for three sessions total over the next few weeks.  History reviewed. No pertinent past medical history.   History reviewed. No pertinent surgical history. Family History  Problem Relation Age of Onset   Breast cancer Mother    Cervical cancer Mother        ovarian   Bone cancer Mother    Breast cancer Maternal Grandmother    Colon cancer Maternal Great-grandmother 39   Esophageal cancer Neg Hx  Stomach cancer Neg Hx    Social History   Tobacco Use   Smoking status: Never   Smokeless tobacco: Never  Vaping Use   Vaping Use: Never used  Substance Use Topics   Alcohol use: Not Currently    Alcohol/week: 21.0 standard drinks of alcohol    Types: 21 Glasses of wine per week    Comment: 8 drinks daily - recently stopped   Drug use: Never   Current Outpatient Medications  Medication Sig Dispense Refill   cyanocobalamin 2000 MCG tablet Take 2,000 mcg by mouth daily. Patient takes 2500 mcg     escitalopram (LEXAPRO) 10 MG tablet Take 1 tablet (10 mg total) by mouth daily. 30 tablet 1   hydrochlorothiazide (HYDRODIURIL) 25 MG tablet Take 1 tablet (25 mg total) by  mouth daily. 90 tablet 3   ondansetron (ZOFRAN-ODT) 4 MG disintegrating tablet Take 1 tablet (4 mg total) by mouth every 8 (eight) hours as needed for nausea or vomiting. 20 tablet 1   potassium chloride SA (KLOR-CON M) 20 MEQ tablet Take 1 tablet (20 mEq total) by mouth 2 (two) times daily. 60 tablet 1   Vitamin D, Ergocalciferol, (DRISDOL) 1.25 MG (50000 UNIT) CAPS capsule Take 1 capsule (50,000 Units total) by mouth every 7 (seven) days. 12 capsule 0   ferrous sulfate 325 (65 FE) MG tablet Take 325 mg by mouth daily with breakfast. (Patient not taking: Reported on 02/21/2022)     No current facility-administered medications for this visit.   No Known Allergies   Review of Systems: All systems reviewed and negative except where noted in HPI.    No results found.  Physical Exam: BP 108/62   Pulse 87   Ht _0  (1.702 m)   Wt 181 lb 12.8 oz (82.5 kg)   LMP 02/09/2022 (Approximate)   SpO2 99%   BMI 28.47 kg/m  Constitutional: Pleasant,well-developed, Caucasian female in no acute distress. HEENT: Normocephalic and atraumatic. Conjunctivae are normal. No scleral icterus. Neck supple.  Cardiovascular: Normal rate, regular rhythm.  Pulmonary/chest: Effort normal and breath sounds normal. No wheezing, rales or rhonchi. Abdominal: Soft, nondistended, nontender. Bowel sounds active throughout. There are no masses palpable. No hepatomegaly. Extremities: trace b/l pitting edema Neurological: Alert and oriented to person place and time. Skin: Skin is warm and dry. No rashes noted. Psychiatric: Normal mood and affect. Behavior is normal.  CBC    Component Value Date/Time   WBC 8.2 02/11/2022 1103   WBC 7.3 02/06/2022 1041   RBC 2.69 (L) 02/11/2022 1104   RBC 2.69 (L) 02/11/2022 1103   HGB 8.2 (L) 02/11/2022 1103   HCT 28.3 (L) 02/11/2022 1103   PLT 247 02/11/2022 1103   MCV 105.2 (H) 02/11/2022 1103   MCH 30.5 02/11/2022 1103   MCHC 29.0 (L) 02/11/2022 1103   RDW 19.0 (H)  02/11/2022 1103   LYMPHSABS 1.8 02/11/2022 1103   MONOABS 0.5 02/11/2022 1103   EOSABS 0.1 02/11/2022 1103   BASOSABS 0.1 02/11/2022 1103    CMP     Component Value Date/Time   NA 138 02/11/2022 1103   K 3.7 02/11/2022 1103   CL 104 02/11/2022 1103   CO2 25 02/11/2022 1103   GLUCOSE 95 02/11/2022 1103   BUN 9 02/11/2022 1103   CREATININE 0.51 02/11/2022 1103   CALCIUM 9.7 02/11/2022 1103   PROT 7.4 02/11/2022 1103   ALBUMIN 3.9 02/11/2022 1103   AST 66 (H) 02/11/2022 1103   ALT 30 02/11/2022 1103  ALKPHOS 92 02/11/2022 1103   BILITOT 1.4 (H) 02/11/2022 1103   GFRNONAA >60 02/11/2022 1103     ASSESSMENT AND PLAN: 41 year old female with several years of heavy alcohol abuse, found to have elevated aminotransferases (AST predominant with elevated tbili, normal alk phos, at least 60 lbs of unintentional weight loss in the past 2 years, new onset LE edema, significant macrocytic anemia with low iron and B12 levels, also with episodic severe nausea and vomiting, new onset constipation and heavy menses. I suspect her iron deficiency anemia is mostly secondary to menstrual blood loss, although alcohol-related bone marrow suppression also likely contributing.  She may also have cirrhosis based on her elevated tbili, and thus would be at risk for portal hypertensive gastropathy or GAVE which would cause iron deficiency.   The etiology of her nausea/vomiting episodes is not clear, but suspect may be related to alcohol use vs anxiety-related/GBAD.  The recurrent nature of her symptoms is suggestive of cyclic vomiting syndrome.   I suspect her unintentional weight loss is most likely related to her alcoholism, poor nutrition, but certainly other etiologies including malignancy need to be considered. Her elevated liver enzymes are almost certainly related to her excessive alcohol use, although she may also have chronic liver disease from other etiologies (viral, autoimmune, genetic) Her lower  extremity edema could be potentially related to cirrhosis/portal hypertension vs poor nutrition/hypoalbuminemia.  A CT abdomen pelvis will exclude intra-abdominal malignancy as a cause of her profound weight loss, will demonstrate the morphology and density of her liver and show if there is any portal hypertension and will also evaluate for potential causes of her abdominal pain and n/v episodes (dilated loops of bowel, distended stomach, pancreatic abnormalities, etc).  An EGD and colonoscopy is also warranted to evaluate her anemia, weight loss, nausea/vomiting and change in bowel habits.  Unintentional weight loss - CT abd/pelvis - EGD/colonoscopy  Elevated aminotransferases/tbili - Hepatic function panel with fractionated bili - GGT - Evaluate for other causes of chronic liver disease (viral, autoimmune, genetic) - CT to assess for cirrhotic morphology/PHT  Macrocytic anemia  Weight loss N/V Elevated LAEs IDA  EGD/Colo CT abd/pelvis  Tawnya Crook, MD

## 2022-02-21 NOTE — Patient Instructions (Addendum)
If you are age 42 or older, your body mass index should be between 23-30. Your Body mass index is 28.47 kg/m. If this is out of the aforementioned range listed, please consider follow up with your Primary Care Provider.  If you are age 51 or younger, your body mass index should be between 19-25. Your Body mass index is 28.47 kg/m. If this is out of the aformentioned range listed, please consider follow up with your Primary Care Provider.   ________________________________________________________  The [facility] providers would like to encourage you to use [facility] to communicate with providers for non-urgent requests or questions.  Due to long hold times on the telephone, sending your provider a message by [facility] may be a faster and more efficient way to get a response.  Please allow 48 business hours for a response.  Please remember that this is for non-urgent requests.   2 DAY PREPARATION FOR COLONOSCOPY WITH SUPREP AND MIRALAX You can try the [email or web address] or GoodRx for a generic Suprep coupon!!  In addition to [employer] at your pharmacy, please purchase the following over the counter: One 119 gram bottle of Miralax powder One box of Dulcolax Laxative 5 mg tablets (you will need 4 tablets)  One 32 oz. bottle of Gatorade (NO RED OR PURPLE)   2 DAYS BEFORE PROCEDURE :         DATE: 03/25/22     DAY: Monday  In the morning, mix into a pitcher the entire 119 gram  bottle of Miralax with a 32 oz  room temperature Gatorade (no red,no purple) stir to dissolve the powder and refrigerate.    Eat a regular diet minus the above foods- eat breakfast , lunch and an early dinner between 4-5 pm   At 3:00 pm take 4 Dulcolax tablets.  4. Eat dinner between 4-5 pm         5. Between 5:00 pm and 7:00 pm, take the Miralax mixture from the refrigerator and Drink a 8 oz.glass of Miralax mixture every 15 minutes until the solution is gone.  6. Once you have completed this prep, you are ONLY  ALLOWED clear liquids no more solid foods      Clear liquids include:  NO RED & NO PURPLE Water Jello-orange, green, blue, yellow  Ice Popsicles- orange or banana  Tea (sugar ok, no milk/cream) Powdered fruit flavored drinks  Coffee (sugar ok, no milk/cream) Gatorade/Pedialyte   Juice: apple, white grape, white cranberry Lemonade, Kool-Aid  Clear bullion,broth (vegetable,chicken,beef) Carbonated beverages (any kind)  Strained chicken noodle soup  (no noodles or chicken) Hard Candy              Your provider has requested that you go to the basement level for lab work before leaving today. Press "B" on the elevator. The lab is located at the first door on the left as you exit the elevator.   You have been scheduled for an endoscopy and colonoscopy. Please follow the written instructions given to you at your visit today. Please pick up your prep supplies at the pharmacy within the next 1-3 days. If you use inhalers (even only as needed), please bring them with you on the day of your procedure.   You have been scheduled for a CT scan of the abdomen and pelvis at [facility], 1st floor Radiology. You are scheduled on 02/28/22  at 3:30 pm. You should arrive 15 minutes prior to your appointment time for registration.  Please pick up  2 bottles of contrast from [place] at least 3 days prior to your scan. The solution may taste better if refrigerated, but do NOT add ice or any other liquid to this solution. Shake well before drinking.   Please follow the written instructions below on the day of your exam:   1) Do not eat anything after 11:30 am (4 hours prior to your test)   2) Drink 1 bottle of contrast @ 1:30 pm (2 hours prior to your exam)  Remember to shake well before drinking and do NOT pour over ice.     Drink 1 bottle of contrast @ 2:30 pm (1 hour prior to your exam)   You may take any medications as prescribed with a small amount of water, if necessary. If you take any of  the following medications: METFORMIN, GLUCOPHAGE, GLUCOVANCE, AVANDAMET, RIOMET, FORTAMET, [place] MET, JANUMET, GLUMETZA or METAGLIP, you MAY be asked to HOLD this medication 48 hours AFTER the exam.   The purpose of you drinking the oral contrast is to aid in the visualization of your intestinal tract. The contrast solution may cause some diarrhea. Depending on your individual set of symptoms, you may also receive an intravenous injection of x-ray contrast/dye. Plan on being at [facility] for 45 minutes or longer, depending on the type of exam you are having performed.   If you have any questions regarding your exam or if you need to reschedule, you may call [facility] at [phone] between the hours of 8:00 am and 5:00 pm, Monday-Friday.     Due to recent changes in healthcare laws, you may see the results of your imaging and laboratory studies on MyChart before your provider has had a chance to review them.  We understand that in some cases there may be results that are confusing or concerning to you. Not all laboratory results come back in the same time frame and the provider may be waiting for multiple results in order to interpret others.  Please give [place] 48 hours in order for your provider to thoroughly review all the results before contacting the office for clarification of your results.    Thank you for entrusting me with your care and choosing [facility].  Dr.Cunningham

## 2022-02-25 ENCOUNTER — Ambulatory Visit (HOSPITAL_COMMUNITY)
Admission: RE | Admit: 2022-02-25 | Discharge: 2022-02-25 | Disposition: A | Discharge status: Home / Self Care | Payer: Managed Care, Other (non HMO) | Source: Ambulatory Visit | Attending: Gastroenterology | Admitting: Gastroenterology

## 2022-02-25 ENCOUNTER — Telehealth: Payer: Self-pay | Admitting: Internal Medicine

## 2022-02-25 DIAGNOSIS — R112 Nausea with vomiting, unspecified: Secondary | ICD-10-CM | POA: Diagnosis present

## 2022-02-25 DIAGNOSIS — R634 Abnormal weight loss: Secondary | ICD-10-CM | POA: Insufficient documentation

## 2022-02-25 DIAGNOSIS — D509 Iron deficiency anemia, unspecified: Secondary | ICD-10-CM | POA: Insufficient documentation

## 2022-02-25 MED ORDER — IOHEXOL 300 MG/ML  SOLN
100.0000 mL | Freq: Once | INTRAMUSCULAR | Status: AC | PRN
  Administered 2022-02-25: 100 mL via INTRAVENOUS

## 2022-02-25 NOTE — Telephone Encounter (Signed)
Received a page to the [place] on call pager regarding a critical result on her CT A/P. Her CT A/P showed an abnormal appearance of the appendix that is suggestive of acute appendicitis. I attempted to call the patient but was not able to reach her. Both phone calls went to voicemail. I left a voicemail on a second call letting her know that I had called. I then tried to call her husband who answered and was able to give the phone to his wife. I told her about the results of her CT scan and recommended that she proceed to the ED for further evaluation and consideration of possible appendectomy. Patient experienced understanding. Will CC Dr. Candis Schatz to this telephone note.

## 2022-02-26 ENCOUNTER — Ambulatory Visit: Payer: Managed Care, Other (non HMO)

## 2022-02-26 NOTE — Telephone Encounter (Signed)
Patient informed that forms have been completed and faxed.     Spoke to pt as to why she didn't go to ER.  She is currently in [place].  No symptoms.  Spoke to Surg-as no symptoms, will await cscope and if unremarkable, refer to surg.  If symptomatic-to ER    Spoke to pt-aware of above plan-If symptoms, to ER immediately.

## 2022-02-27 LAB — IGA: Immunoglobulin A: 603 mg/dL — ABNORMAL HIGH (ref 47–310)

## 2022-02-27 LAB — MITOCHONDRIAL ANTIBODIES: Mitochondrial M2 Ab, IgG: <=20 U (ref <=20.0)

## 2022-02-27 LAB — HEPATITIS B SURFACE ANTIBODY,QUALITATIVE: Hep B S Ab: NONREACTIVE

## 2022-02-27 LAB — IGG: IgG (Immunoglobin G), Serum: 1195 mg/dL (ref 600–1640)

## 2022-02-27 LAB — HEPATITIS B SURFACE ANTIGEN: Hepatitis B Surface Ag: NONREACTIVE

## 2022-02-27 LAB — ANTI-SMOOTH MUSCLE ANTIBODY, IGG: Actin (Smooth Muscle) Antibody (IGG): <20 U (ref <20)

## 2022-02-27 NOTE — Progress Notes (Signed)
CT showed findings suggestive of appendicitis as well as steatosis/hepatomegaly most likely secondary to alcoholic liver disease.  Splenomegaly noted, but no other evidence of portal hypertension (ascites, varices).  No abdominal mass or other concerning finding to explain unintentional weight loss. As I was out of the office at the time the CT was read, Dr. Lorenso Courier was contacted with the CT results and relayed the concern for appendicitis and advised her to go to the ED.

## 2022-02-27 NOTE — Progress Notes (Signed)
Janice Arnold,  Your lab tests were unremarkable.  Your total IgA was elevated, which is often seen in liver disease, but the tests for viral and autoimmune liver disease.  There were two lab tests to evaluate for rare genetic causes of liver disease that were not processed for some reason.  You do not have immunity to hepatitis B and I would recommend you get vaccinated.  We can do that in our office if you like.

## 2022-02-28 ENCOUNTER — Other Ambulatory Visit (HOSPITAL_COMMUNITY): Payer: Managed Care, Other (non HMO)

## 2022-02-28 ENCOUNTER — Other Ambulatory Visit: Payer: Self-pay | Admitting: Family Medicine

## 2022-03-02 ENCOUNTER — Emergency Department (HOSPITAL_COMMUNITY): Payer: Managed Care, Other (non HMO)

## 2022-03-02 ENCOUNTER — Emergency Department (HOSPITAL_BASED_OUTPATIENT_CLINIC_OR_DEPARTMENT_OTHER): Payer: Managed Care, Other (non HMO) | Admitting: Anesthesiology

## 2022-03-02 ENCOUNTER — Encounter (HOSPITAL_COMMUNITY)
Admission: EM | Disposition: A | Discharge status: Home / Self Care | Payer: Self-pay | Source: Home / Self Care | Attending: Emergency Medicine

## 2022-03-02 ENCOUNTER — Ambulatory Visit (HOSPITAL_COMMUNITY)
Admission: EM | Admit: 2022-03-02 | Discharge: 2022-03-02 | Disposition: A | Discharge status: Home / Self Care | Payer: Managed Care, Other (non HMO) | Attending: Emergency Medicine | Admitting: Emergency Medicine

## 2022-03-02 ENCOUNTER — Emergency Department (HOSPITAL_COMMUNITY): Payer: Managed Care, Other (non HMO) | Admitting: Anesthesiology

## 2022-03-02 ENCOUNTER — Other Ambulatory Visit: Payer: Self-pay | Admitting: Family Medicine

## 2022-03-02 ENCOUNTER — Encounter (HOSPITAL_COMMUNITY): Payer: Self-pay

## 2022-03-02 ENCOUNTER — Other Ambulatory Visit: Payer: Self-pay

## 2022-03-02 DIAGNOSIS — R162 Hepatomegaly with splenomegaly, not elsewhere classified: Secondary | ICD-10-CM | POA: Insufficient documentation

## 2022-03-02 DIAGNOSIS — D509 Iron deficiency anemia, unspecified: Secondary | ICD-10-CM | POA: Diagnosis not present

## 2022-03-02 DIAGNOSIS — K3533 Acute appendicitis with perforation and localized peritonitis, with abscess: Secondary | ICD-10-CM | POA: Insufficient documentation

## 2022-03-02 DIAGNOSIS — D649 Anemia, unspecified: Secondary | ICD-10-CM

## 2022-03-02 DIAGNOSIS — D759 Disease of blood and blood-forming organs, unspecified: Secondary | ICD-10-CM | POA: Insufficient documentation

## 2022-03-02 DIAGNOSIS — K3580 Unspecified acute appendicitis: Secondary | ICD-10-CM

## 2022-03-02 DIAGNOSIS — K7689 Other specified diseases of liver: Secondary | ICD-10-CM | POA: Diagnosis not present

## 2022-03-02 DIAGNOSIS — Z6828 Body mass index (BMI) 28.0-28.9, adult: Secondary | ICD-10-CM | POA: Insufficient documentation

## 2022-03-02 DIAGNOSIS — R634 Abnormal weight loss: Secondary | ICD-10-CM | POA: Insufficient documentation

## 2022-03-02 DIAGNOSIS — R7401 Elevation of levels of liver transaminase levels: Secondary | ICD-10-CM | POA: Diagnosis not present

## 2022-03-02 DIAGNOSIS — K5900 Constipation, unspecified: Secondary | ICD-10-CM | POA: Diagnosis not present

## 2022-03-02 DIAGNOSIS — K353 Acute appendicitis with localized peritonitis, without perforation or gangrene: Secondary | ICD-10-CM

## 2022-03-02 DIAGNOSIS — Z9049 Acquired absence of other specified parts of digestive tract: Secondary | ICD-10-CM

## 2022-03-02 HISTORY — DX: Alcohol abuse, uncomplicated: F10.10

## 2022-03-02 HISTORY — DX: Anemia, unspecified: D64.9

## 2022-03-02 HISTORY — DX: Acquired absence of other specified parts of digestive tract: Z90.49

## 2022-03-02 HISTORY — PX: APPENDECTOMY: SHX54

## 2022-03-02 HISTORY — PX: LAPAROSCOPIC APPENDECTOMY: SHX408

## 2022-03-02 HISTORY — DX: Vitamin deficiency, unspecified: E56.9

## 2022-03-02 LAB — URINALYSIS, ROUTINE W REFLEX MICROSCOPIC
Bilirubin Urine: NEGATIVE
Glucose, UA: NEGATIVE mg/dL
Hgb urine dipstick: NEGATIVE
Ketones, ur: NEGATIVE mg/dL
Nitrite: NEGATIVE
Protein, ur: NEGATIVE mg/dL
Specific Gravity, Urine: 1.025 (ref 1.005–1.030)
pH: 5 (ref 5.0–8.0)

## 2022-03-02 LAB — PROTIME-INR
INR: 1.2 (ref 0.8–1.2)
Prothrombin Time: 14.6 seconds (ref 11.4–15.2)

## 2022-03-02 LAB — COMPREHENSIVE METABOLIC PANEL
ALT: 47 U/L — ABNORMAL HIGH (ref 0–44)
AST: 104 U/L — ABNORMAL HIGH (ref 15–41)
Albumin: 3.8 g/dL (ref 3.5–5.0)
Alkaline Phosphatase: 48 U/L (ref 38–126)
Anion gap: 9 (ref 5–15)
BUN: 13 mg/dL (ref 6–20)
CO2: 20 mmol/L — ABNORMAL LOW (ref 22–32)
Calcium: 9.4 mg/dL (ref 8.9–10.3)
Chloride: 110 mmol/L (ref 98–111)
Creatinine, Ser: 0.37 mg/dL — ABNORMAL LOW (ref 0.44–1.00)
GFR, Estimated: >60 mL/min (ref >60)
Glucose, Bld: 95 mg/dL (ref 70–99)
Potassium: 4.2 mmol/L (ref 3.5–5.1)
Sodium: 139 mmol/L (ref 135–145)
Total Bilirubin: 1.3 mg/dL — ABNORMAL HIGH (ref 0.3–1.2)
Total Protein: 7.8 g/dL (ref 6.5–8.1)

## 2022-03-02 LAB — CBC
HCT: 32.1 % — ABNORMAL LOW (ref 36.0–46.0)
Hemoglobin: 9.7 g/dL — ABNORMAL LOW (ref 12.0–15.0)
MCH: 28.2 pg (ref 26.0–34.0)
MCHC: 30.2 g/dL (ref 30.0–36.0)
MCV: 93.3 fL (ref 80.0–100.0)
Platelets: 313 10*3/uL (ref 150–400)
RBC: 3.44 MIL/uL — ABNORMAL LOW (ref 3.87–5.11)
RDW: 17.2 % — ABNORMAL HIGH (ref 11.5–15.5)
WBC: 6.8 10*3/uL (ref 4.0–10.5)
nRBC: 0 % (ref 0.0–0.2)

## 2022-03-02 LAB — I-STAT BETA HCG BLOOD, ED (MC, WL, AP ONLY): I-stat hCG, quantitative: <5 m[IU]/mL (ref <5)

## 2022-03-02 LAB — LIPASE, BLOOD: Lipase: 57 U/L — ABNORMAL HIGH (ref 11–51)

## 2022-03-02 SURGERY — APPENDECTOMY, LAPAROSCOPIC
Anesthesia: General | Site: Abdomen

## 2022-03-02 MED ORDER — PROPOFOL 10 MG/ML IV BOLUS
INTRAVENOUS | Status: AC
  Filled 2022-03-02: qty 20

## 2022-03-02 MED ORDER — LIDOCAINE 2% (20 MG/ML) 5 ML SYRINGE
INTRAMUSCULAR | Status: DC | PRN
  Administered 2022-03-02: 60 mg via INTRAVENOUS

## 2022-03-02 MED ORDER — IOHEXOL 300 MG/ML  SOLN
100.0000 mL | Freq: Once | INTRAMUSCULAR | Status: AC | PRN
  Administered 2022-03-02: 100 mL via INTRAVENOUS

## 2022-03-02 MED ORDER — SODIUM CHLORIDE 0.9 % IV SOLN
2.0000 g | INTRAVENOUS | Status: DC
  Administered 2022-03-02: 2 g via INTRAVENOUS
  Filled 2022-03-02: qty 20

## 2022-03-02 MED ORDER — ROCURONIUM BROMIDE 50 MG/5ML IV SOSY
PREFILLED_SYRINGE | INTRAVENOUS | Status: DC | PRN
  Administered 2022-03-02: 60 mg via INTRAVENOUS

## 2022-03-02 MED ORDER — OXYCODONE HCL 5 MG PO TABS: 5.0000 mg | ORAL_TABLET | Freq: Four times a day (QID) | ORAL | 0 refills | Status: DC | PRN

## 2022-03-02 MED ORDER — FENTANYL CITRATE PF 50 MCG/ML IJ SOSY: 25.0000 ug | PREFILLED_SYRINGE | INTRAMUSCULAR | Status: DC | PRN

## 2022-03-02 MED ORDER — SODIUM CHLORIDE 0.9 % IV SOLN
INTRAVENOUS | Status: DC
Start: 2022-03-02 — End: 2022-03-02

## 2022-03-02 MED ORDER — DIPHENHYDRAMINE HCL 50 MG/ML IJ SOLN
INTRAMUSCULAR | Status: AC
  Filled 2022-03-02: qty 1

## 2022-03-02 MED ORDER — DIPHENHYDRAMINE HCL 50 MG/ML IJ SOLN
12.5000 mg | Freq: Once | INTRAMUSCULAR | Status: AC
  Administered 2022-03-02: 12.5 mg via INTRAVENOUS

## 2022-03-02 MED ORDER — PROPOFOL 10 MG/ML IV BOLUS
INTRAVENOUS | Status: DC | PRN
  Administered 2022-03-02: 18 mg via INTRAVENOUS

## 2022-03-02 MED ORDER — BUPIVACAINE-EPINEPHRINE (PF) 0.25% -1:200000 IJ SOLN
INTRAMUSCULAR | Status: DC | PRN
  Administered 2022-03-02: 12 mL

## 2022-03-02 MED ORDER — ONDANSETRON HCL 4 MG/2ML IJ SOLN
INTRAMUSCULAR | Status: AC
  Filled 2022-03-02: qty 2

## 2022-03-02 MED ORDER — SUGAMMADEX SODIUM 200 MG/2ML IV SOLN
INTRAVENOUS | Status: DC | PRN
  Administered 2022-03-02: 330 mg via INTRAVENOUS

## 2022-03-02 MED ORDER — AMISULPRIDE (ANTIEMETIC) 5 MG/2ML IV SOLN
10.0000 mg | Freq: Once | INTRAVENOUS | Status: AC | PRN
  Administered 2022-03-02: 10 mg via INTRAVENOUS

## 2022-03-02 MED ORDER — DEXAMETHASONE SODIUM PHOSPHATE 10 MG/ML IJ SOLN
INTRAMUSCULAR | Status: AC
  Filled 2022-03-02: qty 1

## 2022-03-02 MED ORDER — BUPIVACAINE-EPINEPHRINE (PF) 0.25% -1:200000 IJ SOLN
INTRAMUSCULAR | Status: AC
  Filled 2022-03-02: qty 30

## 2022-03-02 MED ORDER — FENTANYL CITRATE (PF) 250 MCG/5ML IJ SOLN
INTRAMUSCULAR | Status: DC | PRN
  Administered 2022-03-02: 50 ug via INTRAVENOUS
  Administered 2022-03-02: 100 ug via INTRAVENOUS
  Administered 2022-03-02 (×2): 50 ug via INTRAVENOUS

## 2022-03-02 MED ORDER — PHENYLEPHRINE 80 MCG/ML (10ML) SYRINGE FOR IV PUSH (FOR BLOOD PRESSURE SUPPORT)
PREFILLED_SYRINGE | INTRAVENOUS | Status: AC
  Filled 2022-03-02: qty 10

## 2022-03-02 MED ORDER — DEXAMETHASONE SODIUM PHOSPHATE 10 MG/ML IJ SOLN
INTRAMUSCULAR | Status: DC | PRN
  Administered 2022-03-02: 4 mg via INTRAVENOUS

## 2022-03-02 MED ORDER — LACTATED RINGERS IR SOLN
Status: DC | PRN
  Administered 2022-03-02: 1000 mL

## 2022-03-02 MED ORDER — PHENYLEPHRINE 80 MCG/ML (10ML) SYRINGE FOR IV PUSH (FOR BLOOD PRESSURE SUPPORT)
PREFILLED_SYRINGE | INTRAVENOUS | Status: DC | PRN
  Administered 2022-03-02 (×2): 80 ug via INTRAVENOUS

## 2022-03-02 MED ORDER — AMISULPRIDE (ANTIEMETIC) 5 MG/2ML IV SOLN
INTRAVENOUS | Status: AC
  Filled 2022-03-02: qty 4

## 2022-03-02 MED ORDER — MIDAZOLAM HCL 5 MG/5ML IJ SOLN
INTRAMUSCULAR | Status: DC | PRN
  Administered 2022-03-02: 2 mg via INTRAVENOUS

## 2022-03-02 MED ORDER — METRONIDAZOLE 500 MG/100ML IV SOLN
500.0000 mg | Freq: Two times a day (BID) | INTRAVENOUS | Status: DC
  Administered 2022-03-02: 500 mg via INTRAVENOUS
  Filled 2022-03-02: qty 100

## 2022-03-02 MED ORDER — HYDROMORPHONE HCL 1 MG/ML IJ SOLN
0.5000 mg | Freq: Once | INTRAMUSCULAR | Status: AC
  Administered 2022-03-02: 0.5 mg via INTRAVENOUS
  Filled 2022-03-02: qty 1

## 2022-03-02 MED ORDER — LACTATED RINGERS IV SOLN: INTRAVENOUS | Status: DC | PRN

## 2022-03-02 MED ORDER — LIDOCAINE HCL (PF) 2 % IJ SOLN
INTRAMUSCULAR | Status: AC
  Filled 2022-03-02: qty 5

## 2022-03-02 MED ORDER — 0.9 % SODIUM CHLORIDE (POUR BTL) OPTIME
TOPICAL | Status: DC | PRN
  Administered 2022-03-02: 1000 mL

## 2022-03-02 MED ORDER — ONDANSETRON HCL 4 MG/2ML IJ SOLN
INTRAMUSCULAR | Status: DC | PRN
  Administered 2022-03-02: 4 mg via INTRAVENOUS

## 2022-03-02 MED ORDER — KETOROLAC TROMETHAMINE 30 MG/ML IJ SOLN
INTRAMUSCULAR | Status: DC | PRN
  Administered 2022-03-02: 30 mg via INTRAVENOUS

## 2022-03-02 MED ORDER — FENTANYL CITRATE (PF) 250 MCG/5ML IJ SOLN
INTRAMUSCULAR | Status: AC
  Filled 2022-03-02: qty 5

## 2022-03-02 MED ORDER — CEFAZOLIN SODIUM-DEXTROSE 2-4 GM/100ML-% IV SOLN
INTRAVENOUS | Status: AC
  Filled 2022-03-02: qty 100

## 2022-03-02 MED ORDER — ONDANSETRON HCL 4 MG/2ML IJ SOLN
4.0000 mg | Freq: Once | INTRAMUSCULAR | Status: AC | PRN
  Administered 2022-03-02: 4 mg via INTRAVENOUS
  Filled 2022-03-02: qty 2

## 2022-03-02 MED ORDER — MIDAZOLAM HCL 2 MG/2ML IJ SOLN
INTRAMUSCULAR | Status: AC
  Filled 2022-03-02: qty 2

## 2022-03-02 MED ORDER — MORPHINE SULFATE (PF) 2 MG/ML IV SOLN
2.0000 mg | Freq: Once | INTRAVENOUS | Status: AC
  Administered 2022-03-02: 2 mg via INTRAVENOUS
  Filled 2022-03-02: qty 1

## 2022-03-02 MED ORDER — SODIUM CHLORIDE (PF) 0.9 % IJ SOLN
INTRAMUSCULAR | Status: AC
  Filled 2022-03-02: qty 50

## 2022-03-02 MED ORDER — KETOROLAC TROMETHAMINE 30 MG/ML IJ SOLN
INTRAMUSCULAR | Status: AC
  Filled 2022-03-02: qty 1

## 2022-03-02 SURGICAL SUPPLY — 57 items
APL SKNCLS STERI-STRIP NONHPOA (GAUZE/BANDAGES/DRESSINGS) ×1
APPLIER CLIP 5 13 M/L LIGAMAX5 (MISCELLANEOUS)
APPLIER CLIP ROT 10 11.4 M/L (STAPLE)
APR CLP MED LRG 11.4X10 (STAPLE)
APR CLP MED LRG 5 ANG JAW (MISCELLANEOUS)
BAG COUNTER SPONGE SURGICOUNT (BAG) IMPLANT
BAG SPNG CNTER NS LX DISP (BAG)
BENZOIN TINCTURE PRP APPL 2/3 (GAUZE/BANDAGES/DRESSINGS) ×2 IMPLANT
CABLE HIGH FREQUENCY MONO STRZ (ELECTRODE) IMPLANT
CLIP APPLIE 5 13 M/L LIGAMAX5 (MISCELLANEOUS) IMPLANT
CLIP APPLIE ROT 10 11.4 M/L (STAPLE) IMPLANT
CLSR STERI-STRIP ANTIMIC 1/2X4 (GAUZE/BANDAGES/DRESSINGS) ×1 IMPLANT
COVER SURGICAL LIGHT HANDLE (MISCELLANEOUS) ×2 IMPLANT
CUTTER FLEX LINEAR 45M (STAPLE) ×1 IMPLANT
DRAPE LAPAROSCOPIC ABDOMINAL (DRAPES) ×2 IMPLANT
DRAPE UTILITY XL STRL (DRAPES) ×2 IMPLANT
DRSG TEGADERM 2-3/8X2-3/4 SM (GAUZE/BANDAGES/DRESSINGS) ×2 IMPLANT
ELECT REM PT RETURN 15FT ADLT (MISCELLANEOUS) ×2 IMPLANT
GAUZE SPONGE 2X2 8PLY STRL LF (GAUZE/BANDAGES/DRESSINGS) IMPLANT
GLOVE BIO SURGEON STRL SZ7 (GLOVE) ×2 IMPLANT
GLOVE BIOGEL PI IND STRL 7.0 (GLOVE) ×1 IMPLANT
GLOVE BIOGEL PI IND STRL 7.5 (GLOVE) ×1 IMPLANT
GLOVE BIOGEL PI INDICATOR 7.0 (GLOVE) ×1
GLOVE BIOGEL PI INDICATOR 7.5 (GLOVE) ×1
GOWN STRL REUS W/ TWL LRG LVL3 (GOWN DISPOSABLE) ×2 IMPLANT
GOWN STRL REUS W/TWL LRG LVL3 (GOWN DISPOSABLE) ×4
IRRIG SUCT STRYKERFLOW 2 WTIP (MISCELLANEOUS) ×2
IRRIGATION SUCT STRKRFLW 2 WTP (MISCELLANEOUS) ×1 IMPLANT
KIT BASIN OR (CUSTOM PROCEDURE TRAY) ×2 IMPLANT
KIT TURNOVER KIT A (KITS) IMPLANT
NEEDLE HYPO 22GX1.5 SAFETY (NEEDLE) ×1 IMPLANT
NS IRRIG 1000ML POUR BTL (IV SOLUTION) ×2 IMPLANT
PENCIL SMOKE EVACUATOR (MISCELLANEOUS) ×1 IMPLANT
RELOAD 45 VASCULAR/THIN (ENDOMECHANICALS) IMPLANT
RELOAD STAPLE 45 2.5 WHT GRN (ENDOMECHANICALS) IMPLANT
RELOAD STAPLE 45 3.5 BLU ETS (ENDOMECHANICALS) IMPLANT
RELOAD STAPLE TA45 3.5 REG BLU (ENDOMECHANICALS) ×2 IMPLANT
SCISSORS LAP 5X35 DISP (ENDOMECHANICALS) ×2 IMPLANT
SET TUBE SMOKE EVAC HIGH FLOW (TUBING) ×2 IMPLANT
SHEARS HARMONIC ACE PLUS 36CM (ENDOMECHANICALS) ×1 IMPLANT
SLEEVE ADV FIXATION 5X100MM (TROCAR) IMPLANT
SLEEVE Z-THREAD 5X100MM (TROCAR) ×2 IMPLANT
SOL ANTI FOG 6CC (MISCELLANEOUS) ×1 IMPLANT
SOLUTION ANTI FOG 6CC (MISCELLANEOUS) ×1
SPIKE FLUID TRANSFER (MISCELLANEOUS) ×2 IMPLANT
SPONGE GAUZE 2X2 STER 10/PKG (GAUZE/BANDAGES/DRESSINGS) ×1
STRIP CLOSURE SKIN 1/2X4 (GAUZE/BANDAGES/DRESSINGS) ×2 IMPLANT
SUT MNCRL AB 4-0 PS2 18 (SUTURE) ×2 IMPLANT
SUT VICRYL 0 ENDOLOOP (SUTURE) IMPLANT
SYR CONTROL 10ML LL (SYRINGE) ×1 IMPLANT
SYS BAG RETRIEVAL 10MM (BASKET) ×2
SYSTEM BAG RETRIEVAL 10MM (BASKET) ×1 IMPLANT
TOWEL OR 17X26 10 PK STRL BLUE (TOWEL DISPOSABLE) ×2 IMPLANT
TRAY FOLEY W/BAG SLVR 14FR LF (SET/KITS/TRAYS/PACK) ×1 IMPLANT
TRAY LAPAROSCOPIC (CUSTOM PROCEDURE TRAY) ×2 IMPLANT
TROCAR BALLN 12MMX100 BLUNT (TROCAR) ×2 IMPLANT
TROCAR Z-THREAD OPTICAL 5X100M (TROCAR) ×2 IMPLANT

## 2022-03-02 NOTE — H&P (Signed)
Janice Arnold is an 41 y.o. female.   Chief Complaint: RLQ abdominal pain HPI: This is a 41 year old female who was recently referred to GI for unintentional weight loss, iron deficiency anemia, and elevated liver enzymes.  She has had intermittent nausea and vomiting.  She has also complained of constipation.  She admitted to 8 glasses of vodka daily.  She was initially evaluated by Dr. Candis Schatz 02/21/22.  Work-up was initiated with a CT abdomen/ pelvis, labs, with plans for EGD/ colonoscopy.  On 7/3, her total bilirubin was 1.4 with AST 66/ ALT 30/ alk phos 92.  CT scan on 02/25/22 revealed hepatosplenomegaly, but no obvious hepatic disease.  The appendix appeared thickened up to 10 mm with mild stranding.  No sign of perforation or abscess.  At the time of the CT scan, the patient was not having any abdominal pain.  However, last night she began having abdominal pain that has migrated to the RLQ.  WBC normal.  Hgb 9.7.  LFT's revealed AST 104, ALT 47, Alk phos 48, T bili 1.3.  CT scan today looked about the same with a thickened appendix up to 10 mm in diameter with some stranding, but no sign of perforation or abscess.  PT/ INR is pending.  Past Medical History:  Diagnosis Date   Alcohol abuse    Anemia    Vitamin deficiency     History reviewed. No pertinent surgical history.  Family History  Problem Relation Age of Onset   Breast cancer Mother    Cervical cancer Mother        ovarian   Bone cancer Mother    Breast cancer Maternal Grandmother    Colon cancer Maternal Great-grandmother 60   Esophageal cancer Neg Hx    Stomach cancer Neg Hx    Social History:  reports that she has never smoked. She has never used smokeless tobacco. She reports that she does not currently use alcohol after a past usage of about 21.0 standard drinks of alcohol per week. She reports that she does not use drugs.  Allergies: No Known Allergies  Prior to Admission medications   Medication Sig Start Date  End Date Taking? Authorizing Provider  cyanocobalamin 2000 MCG tablet Take 2,000 mcg by mouth daily. Patient takes 2500 mcg    [provider]  escitalopram (LEXAPRO) 10 MG tablet TAKE 1 TABLET BY MOUTH EVERY DAY 02/28/22   Tawnya Crook, MD  ferrous sulfate 325 (65 FE) MG tablet Take 325 mg by mouth daily with breakfast. Patient not taking: Reported on 02/21/2022    [provider]  hydrochlorothiazide (HYDRODIURIL) 25 MG tablet Take 1 tablet (25 mg total) by mouth daily. 02/06/22   Tawnya Crook, MD  ondansetron (ZOFRAN-ODT) 4 MG disintegrating tablet Take 1 tablet (4 mg total) by mouth every 8 (eight) hours as needed for nausea or vomiting. 01/30/22   Tawnya Crook, MD  potassium chloride SA (KLOR-CON M) 20 MEQ tablet Take 1 tablet (20 mEq total) by mouth 2 (two) times daily. 02/07/22   Tawnya Crook, MD  Vitamin D, Ergocalciferol, (DRISDOL) 1.25 MG (50000 UNIT) CAPS capsule Take 1 capsule (50,000 Units total) by mouth every 7 (seven) days. 01/30/22   Tawnya Crook, MD     Results for orders placed or performed during the hospital encounter of 03/02/22 (from the past 48 hour(s))  Lipase, blood     Status: Abnormal   Collection Time: 03/02/22 10:53 AM  Result Value  Ref Range   Lipase 57 (H) 11 - 51 U/L    Comment: Performed at [facility], [place] [address] [place], [place] [address]  Comprehensive metabolic panel     Status: Abnormal   Collection Time: 03/02/22 10:53 AM  Result Value Ref Range   Sodium 139 135 - 145 mmol/L   Potassium 4.2 3.5 - 5.1 mmol/L    Comment: HEMOLYSIS AT THIS LEVEL MAY AFFECT RESULT   Chloride 110 98 - 111 mmol/L   CO2 20 (L) 22 - 32 mmol/L   Glucose, Bld 95 70 - 99 mg/dL    Comment: Glucose reference range applies only to samples taken after fasting for at least 8 hours.   BUN 13 6 - 20 mg/dL   Creatinine, Ser 0.37 (L) 0.44 - 1.00 mg/dL   Calcium 9.4 8.9 - 10.3 mg/dL   Total Protein 7.8 6.5 - 8.1 g/dL   Albumin  3.8 3.5 - 5.0 g/dL   AST 104 (H) 15 - 41 U/L    Comment: HEMOLYSIS AT THIS LEVEL MAY AFFECT RESULT   ALT 47 (H) 0 - 44 U/L    Comment: HEMOLYSIS AT THIS LEVEL MAY AFFECT RESULT   Alkaline Phosphatase 48 38 - 126 U/L   Total Bilirubin 1.3 (H) 0.3 - 1.2 mg/dL    Comment: HEMOLYSIS AT THIS LEVEL MAY AFFECT RESULT   GFR, Estimated >60 >60 mL/min    Comment: (NOTE) Calculated using the CKD-EPI Creatinine Equation (2021)    Anion gap 9 5 - 15    Comment: Performed at [facility], [place] [address], [place], [place] [address]  CBC     Status: Abnormal   Collection Time: 03/02/22 10:53 AM  Result Value Ref Range   WBC 6.8 4.0 - 10.5 K/uL   RBC 3.44 (L) 3.87 - 5.11 MIL/uL   Hemoglobin 9.7 (L) 12.0 - 15.0 g/dL   HCT 32.1 (L) 36.0 - 46.0 %   MCV 93.3 80.0 - 100.0 fL   MCH 28.2 26.0 - 34.0 pg   MCHC 30.2 30.0 - 36.0 g/dL   RDW 17.2 (H) 11.5 - 15.5 %   Platelets 313 150 - 400 K/uL   nRBC 0.0 0.0 - 0.2 %    Comment: Performed at [facility], [place] [address] [place], [place] [address]  I-Stat beta hCG blood, ED     Status: None   Collection Time: 03/02/22 10:53 AM  Result Value Ref Range   I-stat hCG, quantitative <5.0 <5 mIU/mL   Comment 3            Comment:   GEST. AGE      CONC.  (mIU/mL)   <=1 WEEK        5 - 50     2 WEEKS       50 - 500     3 WEEKS       100 - 10,000     4 WEEKS     1,000 - 30,000        FEMALE AND NON-PREGNANT FEMALE:     LESS THAN 5 mIU/mL   Urinalysis, Routine w reflex microscopic Urine, Clean Catch     Status: Abnormal   Collection Time: 03/02/22  1:22 PM  Result Value Ref Range   Color, Urine AMBER (A) YELLOW    Comment: BIOCHEMICALS MAY BE AFFECTED BY COLOR   APPearance CLOUDY (A) CLEAR   Specific Gravity, Urine 1.025 1.005 - 1.030  pH 5.0 5.0 - 8.0   Glucose, UA NEGATIVE NEGATIVE mg/dL   Hgb urine dipstick NEGATIVE NEGATIVE   Bilirubin Urine NEGATIVE NEGATIVE   Ketones, ur NEGATIVE NEGATIVE mg/dL   Protein, ur  NEGATIVE NEGATIVE mg/dL   Nitrite NEGATIVE NEGATIVE   Leukocytes,Ua MODERATE (A) NEGATIVE   RBC / HPF 21-50 0 - 5 RBC/hpf   WBC, UA 6-10 0 - 5 WBC/hpf   Bacteria, UA FEW (A) NONE SEEN   Squamous Epithelial / LPF 11-20 0 - 5   Mucus PRESENT     Comment: Performed at [facility], [place] [address], [place], [place] [address]   CT ABDOMEN PELVIS W CONTRAST  Result Date: 03/02/2022 CLINICAL DATA:  Abdominal pain. Right lower quadrant pain and nausea. EXAM: CT ABDOMEN AND PELVIS WITH CONTRAST TECHNIQUE: Multidetector CT imaging of the abdomen and pelvis was performed using the standard protocol following bolus administration of intravenous contrast. RADIATION DOSE REDUCTION: This exam was performed according to the departmental dose-optimization program which includes automated exposure control, adjustment of the mA and/or kV according to patient size and/or use of iterative reconstruction technique. CONTRAST:  166m OMNIPAQUE IOHEXOL 300 MG/ML  SOLN COMPARISON:  CT 02/25/2022 FINDINGS: Lower chest: No acute abnormality. Hepatobiliary: Hepatomegaly is again noted, unchanged. No suspicious liver abnormality. Gallbladder appears normal. No bile duct dilatation. Pancreas: Unremarkable. No pancreatic ductal dilatation or surrounding inflammatory changes. Spleen: Unchanged splenomegaly. Spleen has an AP diameter of 15.2 cm, image 24/2. Adrenals/Urinary Tract: Normal adrenal glands. No kidney mass or hydronephrosis. Nonobstructing left renal calculi measures 2-3 mm. Urinary bladder is unremarkable. Stomach/Bowel: Stomach appears normal. The appendix is thickened measuring up to 10 mm in diameter with surrounding inflammatory fat stranding compatible with appendicitis. No signs of perforation or abscess formation. No bowel wall thickening, inflammation, or distension. Vascular/Lymphatic: No significant vascular findings are present. No enlarged abdominal or pelvic lymph nodes. Reproductive: Uterus  and bilateral adnexa are unremarkable. Other: Trace fluid identified within the dependent portion of the pelvis, image 76/2. No fluid collections identified. No signs of pneumoperitoneum. Musculoskeletal: No acute or significant osseous findings. IMPRESSION: 1. Examination is positive for acute appendicitis. No signs of perforation or abscess formation. 2. Hepatosplenomegaly. Electronically Signed   By: TKerby MoorsM.D.   On: 03/02/2022 13:15    Review of Systems  Constitutional:  Positive for unexpected weight change.  HENT:  Negative for ear discharge, ear pain, hearing loss and tinnitus.   Eyes:  Negative for photophobia and pain.  Respiratory:  Negative for cough and shortness of breath.   Cardiovascular:  Negative for chest pain.  Gastrointestinal:  Positive for abdominal pain, constipation, nausea and vomiting.  Genitourinary:  Negative for dysuria, flank pain, frequency and urgency.  Musculoskeletal:  Negative for back pain, myalgias and neck pain.  Neurological:  Negative for dizziness and headaches.  Hematological:  Does not bruise/bleed easily.  Psychiatric/Behavioral:  The patient is not nervous/anxious.     Blood pressure 109/67, pulse 63, temperature 97.9 F (36.6 C), temperature source Oral, resp. rate 17, height '5\' 7"'  (1.702 m), weight 81.6 kg, last menstrual period 02/09/2022, SpO2 98 %. Physical Exam  Constitutional:  WDWN in NAD, conversant, no obvious deformities; lying in bed comfortably Eyes:  Pupils equal, round; sclera anicteric; moist conjunctiva; no lid lag HENT:  Oral mucosa moist; good dentition  Neck:  No masses palpated, trachea midline; no thyromegaly Lungs:  CTA bilaterally; normal respiratory effort CV:  Regular rate and rhythm; no murmurs; extremities well-perfused with  no edema Abd:  +bowel sounds, soft, tender in RLQ, no palpable organomegaly; no palpable hernias Musc:  Unable to assess gait; no apparent clubbing or cyanosis in extremities Lymphatic:   No palpable cervical or axillary lymphadenopathy Skin:  Warm, dry; no sign of jaundice Psychiatric - alert and oriented x 4; calm mood and affect  Assessment/Plan Acute appendicitis 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  PT/ INR pending.  Recommend - laparoscopic appendectomy today.  Will first check PT/ INR to assess for possible coagulopathy due to hepatic dysfunction.  The surgical procedure has been discussed with the patient.  Potential risks, benefits, alternative treatments, and expected outcomes have been explained.  All of the patient's questions at this time have been answered.  The likelihood of reaching the patient's treatment goal is good.  The patient understand the proposed surgical procedure and wishes to proceed.   Patient might be able to go home after surgery.  Maia Petties, MD 03/02/2022, 1:58 PM

## 2022-03-02 NOTE — ED Notes (Signed)
Patient transported to CT 

## 2022-03-02 NOTE — ED Notes (Signed)
Surgery at bedside.

## 2022-03-02 NOTE — Op Note (Signed)
Appendectomy, Lap, Procedure Note  Indications: The patient presented with a history of right-sided abdominal pain. A CT scan revealed findings consistent with acute appendicitis.  Pre-operative Diagnosis: Acute appendicitis without mention of peritonitis  Post-operative Diagnosis: Same  Surgeon: Maia Petties   Assistants: None  Anesthesia: General endotracheal anesthesia  ASA Class: 2E  Procedure Details  The patient was seen again in the Holding Room. The risks, benefits, complications, treatment options, and expected outcomes were discussed with the patient and/or family. The possibilities of reaction to medication, perforation of viscus, bleeding, recurrent infection, finding a normal appendix, the need for additional procedures, failure to diagnose a condition, and creating a complication requiring transfusion or operation were discussed. There was concurrence with the proposed plan and informed consent was obtained. The site of surgery was properly noted. The patient was taken to Operating Room, identified as Janice Arnold and the procedure verified as Appendectomy. A Time Out was held and the above information confirmed.  The patient was placed in the supine position and general anesthesia was induced.  The abdomen was prepped and draped in a sterile fashion. A one centimeter infraumbilical incision was made.  Dissection was carried down to the fascia bluntly.  The fascia was incised vertically.  We entered the peritoneal cavity bluntly.  A pursestring suture was passed around the fascial opening with a 0 Vicryl.  The Hasson cannula was introduced into the abdomen and the tails of the suture were used to hold the Hasson in place.   The pneumoperitoneum was then established maintaining a maximum pressure of 15 mmHg.  Additional 5 mm cannulas then placed in the left lower quadrant of the abdomen and the epigastrium under direct visualization. A careful evaluation of the entire abdomen  was carried out. The patient was placed in Trendelenburg and left lateral decubitus position.  The scope was moved to the right upper quadrant port site. The cecum was mobilized medially.  The appendix was inflamed but there was no sign of perforation.  The appendix was carefully dissected. The appendix was skeletonized with the harmonic scalpel.   The appendix was divided at its base using an endo-GIA stapler. Minimal appendiceal stump was left in place. There was no evidence of bleeding, leakage, or complication after division of the appendix. Irrigation was also performed and irrigate suctioned from the abdomen as well.  The umbilical port site was closed with the purse string suture. There was no residual palpable fascial defect.  The trocar site skin wounds were closed with 4-0 Monocryl.  Instrument, sponge, and needle counts were correct at the conclusion of the case.   Findings: The appendix was found to be inflamed. There were not signs of necrosis.  There was not perforation. There was not abscess formation.  Estimated Blood Loss:  Minimal         Drains: none         Specimens: appendix         Complications:  None; patient tolerated the procedure well.         Disposition: PACU - hemodynamically stable.         Condition: stable   Janice Arnold. Janice Dover, MD, [facility]  General Surgery   03/02/2022 4:34 PM

## 2022-03-02 NOTE — ED Triage Notes (Signed)
Pt c/o right lower quadrant pain and nausea that started this morning. Pt reports she had an outpatient CT on the 17th that showed appendicitis.

## 2022-03-02 NOTE — Transfer of Care (Signed)
Immediate Anesthesia Transfer of Care Note  Patient: Janice Arnold  Procedure(s) Performed: APPENDECTOMY LAPAROSCOPIC (Abdomen)  Patient Location: PACU  Anesthesia Type:General  Level of Consciousness: awake and patient cooperative  Airway & Oxygen Therapy: Patient Spontanous Breathing and Patient connected to face mask oxygen  Post-op Assessment: Report given to RN and Post -op Vital signs reviewed and stable  Post vital signs: Reviewed and stable  Last Vitals:  Vitals Value Taken Time  BP 114/73 03/02/22 1641  Temp    Pulse 82 03/02/22 1643  Resp 13 03/02/22 1643  SpO2 100 % 03/02/22 1643  Vitals shown include unvalidated device data.  Last Pain:  Vitals:   03/02/22 1413  TempSrc: Oral  PainSc:          Complications: No notable events documented.

## 2022-03-02 NOTE — Anesthesia Preprocedure Evaluation (Signed)
Anesthesia Evaluation  Patient identified by MRN, date of birth, ID band Patient awake    Reviewed: Allergy & Precautions, NPO status , Patient's Chart, lab work & pertinent test results  Airway Mallampati: II  TM Distance: >3 FB     Dental  (+) Dental Advisory Given   Pulmonary neg pulmonary ROS,    breath sounds clear to auscultation       Cardiovascular negative cardio ROS   Rhythm:Regular Rate:Normal     Neuro/Psych negative neurological ROS     GI/Hepatic (+)     substance abuse  alcohol use, Acute appendicitis    Endo/Other  negative endocrine ROS  Renal/GU negative Renal ROS     Musculoskeletal   Abdominal   Peds  Hematology  (+) Blood dyscrasia, anemia ,   Anesthesia Other Findings   Reproductive/Obstetrics                             Lab Results  Component Value Date   WBC 6.8 03/02/2022   HGB 9.7 (L) 03/02/2022   HCT 32.1 (L) 03/02/2022   MCV 93.3 03/02/2022   PLT 313 03/02/2022   Lab Results  Component Value Date   CREATININE 0.37 (L) 03/02/2022   BUN 13 03/02/2022   NA 139 03/02/2022   K 4.2 03/02/2022   CL 110 03/02/2022   CO2 20 (L) 03/02/2022    Anesthesia Physical Anesthesia Plan  ASA: 3 and emergent  Anesthesia Plan: General   Post-op Pain Management: Toradol IV (intra-op)*   Induction: Intravenous and Rapid sequence  PONV Risk Score and Plan: 3 and Midazolam, Dexamethasone, Ondansetron and Treatment may vary due to age or medical condition  Airway Management Planned: Oral ETT  Additional Equipment: None  Intra-op Plan:   Post-operative Plan: Extubation in OR  Informed Consent: I have reviewed the patients History and Physical, chart, labs and discussed the procedure including the risks, benefits and alternatives for the proposed anesthesia with the patient or authorized representative who has indicated his/her understanding and acceptance.      Dental advisory given  Plan Discussed with: CRNA  Anesthesia Plan Comments:         Anesthesia Quick Evaluation

## 2022-03-02 NOTE — Anesthesia Procedure Notes (Signed)
Procedure Name: Intubation Date/Time: 03/02/2022 3:55 PM  Performed by: Renato Shin, CRNAPre-anesthesia Checklist: Patient identified, Emergency Drugs available, Suction available and Patient being monitored Patient Re-evaluated:Patient Re-evaluated prior to induction Oxygen Delivery Method: Circle system utilized Preoxygenation: Pre-oxygenation with 100% oxygen Induction Type: IV induction Ventilation: Mask ventilation without difficulty Laryngoscope Size: Miller and 2 Grade View: Grade I Tube type: Oral Tube size: 7.0 mm Number of attempts: 1 Airway Equipment and Method: Stylet and Oral airway Placement Confirmation: ETT inserted through vocal cords under direct vision, positive ETCO2 and breath sounds checked- equal and bilateral Secured at: 21 cm Tube secured with: Tape Dental Injury: Teeth and Oropharynx as per pre-operative assessment

## 2022-03-02 NOTE — ED Provider Notes (Signed)
[facility] DEPT Provider Note   CSN: [id number] Arrival date & time: 03/02/22  1023     History  Chief Complaint  Patient presents with   Abdominal Pain   Nausea    Janice Arnold is a 41 y.o. female with a past medical history of transaminitis presenting today with a complaint of right lower quadrant pain and known appendicitis.  She reports that for the past 18 months she has had bouts of vomiting and weight loss that last around 4 to 6 weeks.  She was referred to GI by PCP and had a CT scan on Monday which showed appendicitis.  She was about this but she was out of town so she did not get seen.  She was told by GI that she was likely okay unless she started to have pain.  At 2:00 this morning she started to have right lower quadrant pain.  Says that it started in the middle of her abdomen and migrated more.  No fevers or chills.  She has been nauseated but not vomiting.  Reports that until June she was drinking 8 glasses of alcohol a day but she stopped just prior to seeing GI.  She last had a meal last night at 10 PM and has been sipping water this morning.  Spoke with her GI doctor who said she likely needs surgery.  Follows with Dr. Candis Schatz with [facility]   Abdominal Pain      Home Medications Prior to Admission medications   Medication Sig Start Date End Date Taking? Authorizing Provider  cyanocobalamin 2000 MCG tablet Take 2,000 mcg by mouth daily. Patient takes 2500 mcg    [provider]  escitalopram (LEXAPRO) 10 MG tablet TAKE 1 TABLET BY MOUTH EVERY DAY 02/28/22   Tawnya Crook, MD  ferrous sulfate 325 (65 FE) MG tablet Take 325 mg by mouth daily with breakfast. Patient not taking: Reported on 02/21/2022    [provider]  hydrochlorothiazide (HYDRODIURIL) 25 MG tablet Take 1 tablet (25 mg total) by mouth daily. 02/06/22   Tawnya Crook, MD  ondansetron (ZOFRAN-ODT) 4 MG disintegrating tablet Take 1 tablet (4 mg  total) by mouth every 8 (eight) hours as needed for nausea or vomiting. 01/30/22   Tawnya Crook, MD  potassium chloride SA (KLOR-CON M) 20 MEQ tablet Take 1 tablet (20 mEq total) by mouth 2 (two) times daily. 02/07/22   Tawnya Crook, MD  Vitamin D, Ergocalciferol, (DRISDOL) 1.25 MG (50000 UNIT) CAPS capsule Take 1 capsule (50,000 Units total) by mouth every 7 (seven) days. 01/30/22   Tawnya Crook, MD      Allergies    Patient has no known allergies.    Review of Systems   Review of Systems  Gastrointestinal:  Positive for abdominal pain.    Physical Exam Updated Vital Signs BP 135/76 (BP Location: Left Arm)   Pulse 82   Temp 97.9 F (36.6 C) (Oral)   Resp 18   Ht '5\' 7"'$  (1.702 m)   Wt 81.6 kg   LMP 02/09/2022 (Approximate)   SpO2 100%   BMI 28.19 kg/m  Physical Exam Vitals and nursing note reviewed.  Constitutional:      General: She is not in acute distress.    Appearance: Normal appearance. She is not ill-appearing.  HENT:     Head: Normocephalic and atraumatic.  Eyes:     General: No scleral icterus.    Conjunctiva/sclera: Conjunctivae normal.  Pulmonary:  Effort: Pulmonary effort is normal. No respiratory distress.  Abdominal:     Tenderness: There is abdominal tenderness in the right lower quadrant.  Skin:    Findings: No rash.  Neurological:     Mental Status: She is alert.  Psychiatric:        Mood and Affect: Mood normal.     ED Results / Procedures / Treatments   Labs (all labs ordered are listed, but only abnormal results are displayed) Labs Reviewed  LIPASE, BLOOD - Abnormal; Notable for the following components:      Result Value   Lipase 57 (*)    All other components within normal limits  COMPREHENSIVE METABOLIC PANEL - Abnormal; Notable for the following components:   CO2 20 (*)    Creatinine, Ser 0.37 (*)    AST 104 (*)    ALT 47 (*)    Total Bilirubin 1.3 (*)    All other components within normal limits  CBC - Abnormal;  Notable for the following components:   RBC 3.44 (*)    Hemoglobin 9.7 (*)    HCT 32.1 (*)    RDW 17.2 (*)    All other components within normal limits  URINALYSIS, ROUTINE W REFLEX MICROSCOPIC - Abnormal; Notable for the following components:   Color, Urine AMBER (*)    APPearance CLOUDY (*)    Leukocytes,Ua MODERATE (*)    Bacteria, UA FEW (*)    All other components within normal limits  PROTIME-INR  I-STAT BETA HCG BLOOD, ED ([facility], [facility], [facility] ONLY)    EKG None  Radiology CT ABDOMEN PELVIS W CONTRAST  Result Date: 03/02/2022 CLINICAL DATA:  Abdominal pain. Right lower quadrant pain and nausea. EXAM: CT ABDOMEN AND PELVIS WITH CONTRAST TECHNIQUE: Multidetector CT imaging of the abdomen and pelvis was performed using the standard protocol following bolus administration of intravenous contrast. RADIATION DOSE REDUCTION: This exam was performed according to the departmental dose-optimization program which includes automated exposure control, adjustment of the mA and/or kV according to patient size and/or use of iterative reconstruction technique. CONTRAST:  174m OMNIPAQUE IOHEXOL 300 MG/ML  SOLN COMPARISON:  CT 02/25/2022 FINDINGS: Lower chest: No acute abnormality. Hepatobiliary: Hepatomegaly is again noted, unchanged. No suspicious liver abnormality. Gallbladder appears normal. No bile duct dilatation. Pancreas: Unremarkable. No pancreatic ductal dilatation or surrounding inflammatory changes. Spleen: Unchanged splenomegaly. Spleen has an AP diameter of 15.2 cm, image 24/2. Adrenals/Urinary Tract: Normal adrenal glands. No kidney mass or hydronephrosis. Nonobstructing left renal calculi measures 2-3 mm. Urinary bladder is unremarkable. Stomach/Bowel: Stomach appears normal. The appendix is thickened measuring up to 10 mm in diameter with surrounding inflammatory fat stranding compatible with appendicitis. No signs of perforation or abscess formation. No bowel wall thickening, inflammation, or  distension. Vascular/Lymphatic: No significant vascular findings are present. No enlarged abdominal or pelvic lymph nodes. Reproductive: Uterus and bilateral adnexa are unremarkable. Other: Trace fluid identified within the dependent portion of the pelvis, image 76/2. No fluid collections identified. No signs of pneumoperitoneum. Musculoskeletal: No acute or significant osseous findings. IMPRESSION: 1. Examination is positive for acute appendicitis. No signs of perforation or abscess formation. 2. Hepatosplenomegaly. Electronically Signed   By: TKerby MoorsM.D.   On: 03/02/2022 13:15    Procedures Procedures   Medications Ordered in ED Medications  ondansetron (ZOFRAN) injection 4 mg (4 mg Intravenous Given 03/02/22 1054)  morphine (PF) 2 MG/ML injection 2 mg (2 mg Intravenous Given 03/02/22 1146)  iohexol (OMNIPAQUE) 300 MG/ML solution 100 mL (100  mLs Intravenous Contrast Given 03/02/22 1256)  sodium chloride (PF) 0.9 % injection (  Given by Other 03/02/22 1306)    ED Course/ Medical Decision Making/ A&P                           Medical Decision Making Amount and/or Complexity of Data Reviewed Labs: ordered. Radiology: ordered.  Risk Prescription drug management. Decision regarding hospitalization.   This patient presents to the ED for concern of abdominal pain in the setting of known pain.  Tightness.  She also had hepatomegaly and splenomegaly on her CT scan.   This is not an exhaustive differential.    Past Medical History / Co-morbidities / Social History: Previous alcohol use disorder   Additional history: Per chart review patient follows with [facility].  She was seeing them for her nausea and vomiting that happens intermittently for over a year.  She also endorsed some unintentional weight loss.  CT imaging and work-up by GI reveals no cause of her weight loss but they called her about her acute appendectomy   Physical Exam: Pertinent physical exam findings include right  lower quadrant tenderness, afebrile, nontachycardic  Lab Tests: I ordered, and personally interpreted labs.  The pertinent results include: Normal white count   Imaging Studies: I ordered and independently visualized and interpreted her previous CT which showed appendicitis, splenomegaly and hepatomegaly. I agree with the radiologist interpretation.  Her second CT scan was also visualized and interpreted by me.  It also called for acute appendicitis    Medications: I ordered medication including morphine. Reevaluation of the patient after these medicines showed that the patient improved. I have reviewed the patients home medicines and have made adjustments as needed.   Consultations Obtained: I discussed lab and imaging findings with Dr. Georgette Dover with general surgery.  He will come see the patient.  Disposition: To be admitted with general surgery   I discussed this case with my attending physician Dr. Eulis Foster who cosigned this note including patient's presenting symptoms, physical exam, and planned diagnostics and interventions. Attending physician stated agreement with plan or made changes to plan which were implemented.     Final Clinical Impression(s) / ED Diagnoses Final diagnoses:  Acute appendicitis with localized peritonitis, without perforation, abscess, or gangrene    Rx / DC Orders Admit to surgery with Dr. Rockne Menghini, Reford Olliff A, PA-C 03/02/22 1528    Daleen Bo, MD 03/02/22 1655

## 2022-03-02 NOTE — Discharge Instructions (Signed)
[facility] ______[facility] LAPAROSCOPIC SURGERY: POST OP INSTRUCTIONS Always review your discharge instruction sheet given to you by the facility where your surgery was performed. IF YOU HAVE DISABILITY OR FAMILY LEAVE FORMS, YOU MUST BRING THEM TO THE OFFICE FOR PROCESSING.   DO NOT GIVE THEM TO YOUR DOCTOR.  A prescription for pain medication may be given to you upon discharge.  Take your pain medication as prescribed, if needed.  If narcotic pain medicine is not needed, then you may take acetaminophen (Tylenol) or ibuprofen (Advil) as needed. Take your usually prescribed medications unless otherwise directed. If you need a refill on your pain medication, please contact your pharmacy.  They will contact our office to request authorization. Prescriptions will not be filled after 5pm or on week-ends. You should follow a light diet the first few days after arrival home, such as soup and crackers, etc.  Be sure to include lots of fluids daily. Most patients will experience some swelling and bruising in the area of the incisions.  Ice packs will help.  Swelling and bruising can take several days to resolve.  It is common to experience some constipation if taking pain medication after surgery.  Increasing fluid intake and taking a stool softener (such as Colace) will usually help or prevent this problem from occurring.  A mild laxative (Milk of Magnesia or Miralax) should be taken according to package instructions if there are no bowel movements after 48 hours. Unless discharge instructions indicate otherwise, you may remove your bandages 24-48 hours after surgery, and you may shower at that time.  You may have steri-strips (small skin tapes) in place directly over the incision.  These strips should be left on the skin for 7-10 days.  If your surgeon used skin glue on the incision, you may shower in 24 hours.  The glue will flake off over the next 2-3 weeks.  Any sutures or staples will be  removed at the office during your follow-up visit. ACTIVITIES:  You may resume regular (light) daily activities beginning the next day--such as daily self-care, walking, climbing stairs--gradually increasing activities as tolerated.  You may have sexual intercourse when it is comfortable.  Refrain from any heavy lifting or straining until approved by your doctor. You may drive when you are no longer taking prescription pain medication, you can comfortably wear a seatbelt, and you can safely maneuver your car and apply brakes. RETURN TO WORK:  __________________________________________________________ You should see your doctor in the office for a follow-up appointment approximately 2-3 weeks after your surgery.  Make sure that you call for this appointment within a day or two after you arrive home to insure a convenient appointment time. OTHER INSTRUCTIONS: __________________________________________________________________________________________________________________________ __________________________________________________________________________________________________________________________ WHEN TO CALL YOUR DOCTOR: Fever over 101.0 Inability to urinate Continued bleeding from incision. Increased pain, redness, or drainage from the incision. Increasing abdominal pain  The clinic staff is available to answer your questions during regular business hours.  Please don't hesitate to call and ask to speak to one of the nurses for clinical concerns.  If you have a medical emergency, go to the nearest emergency room or call 911.  A surgeon from [facility] is always on call at the hospital. [address], [address], [place], [place]  [address] ? [address] [address], [place],    [address] [phone] ? [phone] ? FAX [phone] Web site: [email or web address]  

## 2022-03-04 ENCOUNTER — Ambulatory Visit (INDEPENDENT_AMBULATORY_CARE_PROVIDER_SITE_OTHER): Payer: Managed Care, Other (non HMO) | Admitting: Family Medicine

## 2022-03-04 ENCOUNTER — Inpatient Hospital Stay: Payer: Managed Care, Other (non HMO)

## 2022-03-04 ENCOUNTER — Telehealth: Payer: Self-pay | Admitting: Family Medicine

## 2022-03-04 ENCOUNTER — Encounter: Payer: Self-pay | Admitting: Family Medicine

## 2022-03-04 VITALS — BP 114/67 | HR 81 | Temp 97.3°F | Resp 17

## 2022-03-04 VITALS — BP 126/85 | HR 71 | Temp 97.7°F | Wt 185.0 lb

## 2022-03-04 DIAGNOSIS — Z23 Encounter for immunization: Secondary | ICD-10-CM

## 2022-03-04 DIAGNOSIS — F4323 Adjustment disorder with mixed anxiety and depressed mood: Secondary | ICD-10-CM | POA: Diagnosis not present

## 2022-03-04 DIAGNOSIS — R7401 Elevation of levels of liver transaminase levels: Secondary | ICD-10-CM

## 2022-03-04 DIAGNOSIS — R300 Dysuria: Secondary | ICD-10-CM

## 2022-03-04 DIAGNOSIS — R112 Nausea with vomiting, unspecified: Secondary | ICD-10-CM

## 2022-03-04 DIAGNOSIS — D509 Iron deficiency anemia, unspecified: Secondary | ICD-10-CM | POA: Diagnosis not present

## 2022-03-04 DIAGNOSIS — D500 Iron deficiency anemia secondary to blood loss (chronic): Secondary | ICD-10-CM | POA: Diagnosis not present

## 2022-03-04 LAB — POCT URINALYSIS DIPSTICK
Bilirubin, UA: NEGATIVE
Blood, UA: NEGATIVE
Glucose, UA: NEGATIVE
Ketones, UA: NEGATIVE
Nitrite, UA: NEGATIVE
Protein, UA: NEGATIVE
Spec Grav, UA: >=1.03 — AB (ref 1.010–1.025)
Urobilinogen, UA: 0.2 E.U./dL
pH, UA: 5.5 (ref 5.0–8.0)

## 2022-03-04 MED ORDER — SODIUM CHLORIDE 0.9 % IV SOLN
300.0000 mg | Freq: Once | INTRAVENOUS | Status: AC
  Administered 2022-03-04: 300 mg via INTRAVENOUS
  Filled 2022-03-04: qty 300

## 2022-03-04 MED ORDER — HYDROXYZINE HCL 10 MG PO TABS: 10.0000 mg | ORAL_TABLET | Freq: Three times a day (TID) | ORAL | 1 refills | Status: DC | PRN

## 2022-03-04 MED ORDER — ESCITALOPRAM OXALATE 20 MG PO TABS: 20.0000 mg | ORAL_TABLET | Freq: Every day | ORAL | 1 refills | Status: DC

## 2022-03-04 MED ORDER — ONDANSETRON 4 MG PO TBDP: 4.0000 mg | ORAL_TABLET | Freq: Three times a day (TID) | ORAL | 3 refills | Status: DC | PRN

## 2022-03-04 MED ORDER — CYANOCOBALAMIN 1000 MCG/ML IJ SOLN
1000.0000 ug | Freq: Once | INTRAMUSCULAR | Status: AC
  Administered 2022-03-04: 1000 ug via INTRAMUSCULAR

## 2022-03-04 MED ORDER — SODIUM CHLORIDE 0.9 % IV SOLN: Freq: Once | INTRAVENOUS | Status: AC

## 2022-03-04 NOTE — Telephone Encounter (Signed)
Noted  

## 2022-03-04 NOTE — Patient Instructions (Signed)

## 2022-03-04 NOTE — Telephone Encounter (Signed)
FYI-- I was able to get patient scheduled until the week of August 21st. Since she has an OV on 08/21 with Dr. Cherlynn Kaiser, she would like her final weekly B12 injection completed then.

## 2022-03-04 NOTE — Telephone Encounter (Signed)
Please call patient to schedule. Patient had B 12 in office today.

## 2022-03-04 NOTE — Progress Notes (Signed)
Subjective:     Patient ID: Janice Arnold, female    DOB: 1981-01-07, 41 y.o.   MRN: [id number]  Chief Complaint  Patient presents with   Follow-up    4 weeks follow up mood, pt denies changes in mood.     HPI  Depression/anxiety-started Lexapro '10mg'$  few wks ago. Moods are "ok", but if irrit, can be bad. No SI Stopped ETOH.  Still getting shakey at times, still nauseated a lot.   Anemia-scopes 8/16.  Saw gyn-couldn't find IUD and will f/u 7/31 for u/s and mamm. Going for infusion iron today.  Health Maintenance Due  Topic Date Due   MAMMOGRAM  04/29/2020    Past Medical History:  Diagnosis Date   Alcohol abuse    Anemia    History of laparoscopic appendectomy 03/02/2022   Vitamin deficiency     Past Surgical History:  Procedure Laterality Date   APPENDECTOMY  03/02/2022   LAPAROSCOPIC APPENDECTOMY N/A 03/02/2022   Procedure: APPENDECTOMY LAPAROSCOPIC;  Surgeon: Donnie Mesa, MD;  Location: [facility];  Service: General;  Laterality: N/A;    Outpatient Medications Prior to Visit  Medication Sig Dispense Refill   acetaminophen (TYLENOL) 500 MG tablet Take 1,000 mg by mouth 2 (two) times daily as needed (pain).     hydrochlorothiazide (HYDRODIURIL) 25 MG tablet Take 1 tablet (25 mg total) by mouth daily. 90 tablet 3   ibuprofen (ADVIL) 200 MG tablet Take 400 mg by mouth See admin instructions. Take 400 mg by mouth 2-3 times a day as needed for pain     KLOR-CON M20 20 MEQ tablet TAKE 1 TABLET BY MOUTH TWICE A DAY 60 tablet 1   OVER THE COUNTER MEDICATION Take 2,500 mcg by mouth daily. Vitamin B-12 2500 mcg     oxyCODONE (OXY IR/ROXICODONE) 5 MG immediate release tablet Take 1 tablet (5 mg total) by mouth every 6 (six) hours as needed for severe pain. 21 tablet 0   PRESCRIPTION MEDICATION Inject into the vein once a week. Iron infusion     Vitamin D, Ergocalciferol, (DRISDOL) 1.25 MG (50000 UNIT) CAPS capsule Take 1 capsule (50,000 Units total) by mouth every 7 (seven) days.  12 capsule 0   escitalopram (LEXAPRO) 10 MG tablet TAKE 1 TABLET BY MOUTH EVERY DAY 90 tablet 1   ondansetron (ZOFRAN-ODT) 4 MG disintegrating tablet Take 1 tablet (4 mg total) by mouth every 8 (eight) hours as needed for nausea or vomiting. (Patient taking differently: Take 4 mg by mouth See admin instructions. Take 4 mg by mouth 1-2 times a day as needed for nausea and vomiting) 20 tablet 1   ferrous sulfate 325 (65 FE) MG tablet Take 325 mg by mouth daily with breakfast. (Patient not taking: Reported on 03/04/2022)     metroNIDAZOLE (FLAGYL) 500 MG tablet Take 500 mg by mouth 2 (two) times daily. (Patient not taking: Reported on 03/02/2022)     No facility-administered medications prior to visit.    No Known Allergies ROS neg/noncontributory except as noted HPI/below      Objective:     BP 126/85 (BP Location: Left Arm, Patient Position: Sitting, Cuff Size: Normal)   Pulse 71   Temp 97.7 F (36.5 C) (Temporal)   Wt 185 lb (83.9 kg)   LMP 02/09/2022 (Approximate) Comment: negative HCG blood test 03-02-2022  SpO2 97%   BMI 28.98 kg/m  Wt Readings from Last 3 Encounters:  03/04/22 185 lb (83.9 kg)  03/02/22 180 lb (81.6 kg)  02/21/22 181 lb 12.8 oz (82.5 kg)    Physical Exam   Gen: WDWN NAD.  Pale but improved HEENT: NCAT, conjunctiva not injected, sclera nonicteric NECK:  supple, no thyromegaly, no nodes, no carotid bruits CARDIAC: RRR, S1S2+, no murmur. DP 2+B ABDOMEN:  BS+, soft, NTND, healing from appy EXT:  no edema MSK: no gross abnormalities.  NEURO: A&O x3.  CN II-XII intact.  PSYCH: normal mood. Good eye contact  Results for orders placed or performed in visit on 03/04/22  POCT urinalysis dipstick  Result Value Ref Range   Color, UA yellow    Clarity, UA cloudy    Glucose, UA Negative Negative   Bilirubin, UA neg    Ketones, UA neg    Spec Grav, UA >=1.030 (A) 1.010 - 1.025   Blood, UA neg    pH, UA 5.5 5.0 - 8.0   Protein, UA Negative Negative    Urobilinogen, UA 0.2 0.2 or 1.0 E.U./dL   Nitrite, UA neg    Leukocytes, UA Trace (A) Negative   Appearance     Odor          Assessment & Plan:   Problem List Items Addressed This Visit       Other   Transaminitis   Iron deficiency anemia due to chronic blood loss   Other Visit Diagnoses     Adjustment disorder with mixed anxiety and depressed mood    -  Primary   Dysuria       Relevant Orders   Urine Culture   POCT urinalysis dipstick   Nausea and vomiting, unspecified vomiting type       Relevant Medications   ondansetron (ZOFRAN-ODT) 4 MG disintegrating tablet   Need for hepatitis B vaccination       Relevant Orders   Hepatitis B vaccine adult IM (Completed)      Adjustment disorder-mixed.  Some better on Lexapro, but not at goal-increase to '20mg'$ .  F/u 1 mo N/v-multifactorial-w/u in progress. Renewed ondasterone '4mg'$  Iron def anemia and b12 def.  Getting iron infusions-improving.  Will get today.  Will do B12 injection today as well as 207 and may have some malabsorption.  Getting scopes next mo,  seeing gyn end of month Elevated LFT's.  Off ETOH for >1 mo.  CT-some HSM.  May still be healing from appy, etc.  Monitor closely.   Dysuria-UA w/WBC in ER-?from appy.  Got IV Abx.  Will repeat ua and cx.    Meds ordered this encounter  Medications   ondansetron (ZOFRAN-ODT) 4 MG disintegrating tablet    Sig: Take 1 tablet (4 mg total) by mouth every 8 (eight) hours as needed for nausea or vomiting.    Dispense:  60 tablet    Refill:  3   escitalopram (LEXAPRO) 20 MG tablet    Sig: Take 1 tablet (20 mg total) by mouth daily.    Dispense:  90 tablet    Refill:  1   hydrOXYzine (ATARAX) 10 MG tablet    Sig: Take 1 tablet (10 mg total) by mouth 3 (three) times daily as needed for itching.    Dispense:  45 tablet    Refill:  1   cyanocobalamin ((VITAMIN B-12)) injection 1,000 mcg    Wellington Hampshire, MD

## 2022-03-04 NOTE — Telephone Encounter (Signed)
Following visit on 03/04/22 with Dr. Cherlynn Kaiser, patient wanted to address B12 injections. States she was informed she would need to have B12 injections completed. Patient requests a call back to settle when she needs to have these completed and how often. Please Advise.

## 2022-03-04 NOTE — Progress Notes (Signed)
Pt declined to stay for post infusion observation period. Pt stated she has tolerated medication multiple times prior without difficulty. Pt aware to call clinic with any questions or concerns. Pt verbalized understanding and had no further questions.  ? ?

## 2022-03-04 NOTE — Anesthesia Postprocedure Evaluation (Signed)
Anesthesia Post Note  Patient: Janice Arnold  Procedure(s) Performed: APPENDECTOMY LAPAROSCOPIC (Abdomen)     Patient location during evaluation: PACU Anesthesia Type: General Level of consciousness: awake and alert Pain management: pain level controlled Vital Signs Assessment: post-procedure vital signs reviewed and stable Respiratory status: spontaneous breathing, nonlabored ventilation, respiratory function stable and patient connected to nasal cannula oxygen Cardiovascular status: blood pressure returned to baseline and stable Postop Assessment: no apparent nausea or vomiting Anesthetic complications: no   No notable events documented.  Last Vitals:  Vitals:   03/02/22 1700 03/02/22 1727  BP: 137/80 (!) 141/90  Pulse: 87 82  Resp: (!) 23 17  Temp:  36.4 C  SpO2: 100% 100%    Last Pain:  Vitals:   03/02/22 1727  TempSrc:   PainSc: 0-No pain                 Tiajuana Amass

## 2022-03-04 NOTE — Patient Instructions (Signed)
It was very nice to see you today!  Increase lexapro to '20mg'$ .    PLEASE NOTE:  If you had any lab tests please let us know if you have not heard back within a few days. You may see your results on MyChart before we have a chance to review them but we will give you a call once they are reviewed by [place]. If we ordered any referrals today, please let us know if you have not heard from their office within the next week.   Please try these tips to maintain a healthy lifestyle:  Eat most of your calories during the day when you are active. Eliminate processed foods including packaged sweets (pies, cakes, cookies), reduce intake of potatoes, white bread, white pasta, and white rice. Look for whole grain options, oat flour or almond flour.  Each meal should contain half fruits/vegetables, one quarter protein, and one quarter carbs (no bigger than a computer mouse).  Cut down on sweet beverages. This includes juice, soda, and sweet tea. Also watch fruit intake, though this is a healthier sweet option, it still contains natural sugar! Limit to 3 servings daily.  Drink at least 1 glass of water with each meal and aim for at least 8 glasses per day  Exercise at least 150 minutes every week.

## 2022-03-05 ENCOUNTER — Ambulatory Visit: Payer: Managed Care, Other (non HMO)

## 2022-03-05 LAB — URINE CULTURE
MICRO NUMBER:: 13685624
Result:: NO GROWTH
SPECIMEN QUALITY:: ADEQUATE

## 2022-03-05 LAB — SURGICAL PATHOLOGY

## 2022-03-11 ENCOUNTER — Other Ambulatory Visit: Payer: Self-pay | Admitting: Family Medicine

## 2022-03-11 MED ORDER — VITAMIN D (ERGOCALCIFEROL) 1.25 MG (50000 UNIT) PO CAPS: 50000.0000 [IU] | ORAL_CAPSULE | ORAL | 0 refills | Status: DC

## 2022-03-12 ENCOUNTER — Ambulatory Visit (INDEPENDENT_AMBULATORY_CARE_PROVIDER_SITE_OTHER): Payer: Managed Care, Other (non HMO)

## 2022-03-12 ENCOUNTER — Inpatient Hospital Stay: Payer: Managed Care, Other (non HMO) | Attending: Hematology & Oncology

## 2022-03-12 VITALS — BP 109/59 | HR 68 | Temp 98.1°F

## 2022-03-12 DIAGNOSIS — E538 Deficiency of other specified B group vitamins: Secondary | ICD-10-CM

## 2022-03-12 DIAGNOSIS — D509 Iron deficiency anemia, unspecified: Secondary | ICD-10-CM | POA: Diagnosis not present

## 2022-03-12 DIAGNOSIS — D500 Iron deficiency anemia secondary to blood loss (chronic): Secondary | ICD-10-CM

## 2022-03-12 MED ORDER — CYANOCOBALAMIN 1000 MCG/ML IJ SOLN
1000.0000 ug | Freq: Once | INTRAMUSCULAR | Status: AC
  Administered 2022-03-12: 1000 ug via INTRAMUSCULAR

## 2022-03-12 MED ORDER — SODIUM CHLORIDE 0.9 % IV SOLN
300.0000 mg | Freq: Once | INTRAVENOUS | Status: AC
  Administered 2022-03-12: 300 mg via INTRAVENOUS
  Filled 2022-03-12: qty 300

## 2022-03-12 MED ORDER — SODIUM CHLORIDE 0.9 % IV SOLN: Freq: Once | INTRAVENOUS | Status: AC

## 2022-03-12 NOTE — Patient Instructions (Signed)

## 2022-03-12 NOTE — Progress Notes (Signed)
Pt in for Vitamin B-12 injection and it was administered in Left Deltoid with no issues, t tolerated injection well.

## 2022-03-19 ENCOUNTER — Ambulatory Visit: Payer: Managed Care, Other (non HMO)

## 2022-03-20 ENCOUNTER — Encounter: Payer: Self-pay | Admitting: Gastroenterology

## 2022-03-25 ENCOUNTER — Other Ambulatory Visit: Payer: Managed Care, Other (non HMO)

## 2022-03-25 ENCOUNTER — Other Ambulatory Visit: Payer: Self-pay | Admitting: Family Medicine

## 2022-03-25 ENCOUNTER — Telehealth: Payer: Self-pay | Admitting: Gastroenterology

## 2022-03-25 ENCOUNTER — Other Ambulatory Visit: Payer: Self-pay

## 2022-03-25 ENCOUNTER — Ambulatory Visit: Payer: Managed Care, Other (non HMO) | Admitting: Family

## 2022-03-25 MED ORDER — NA SULFATE-K SULFATE-MG SULF 17.5-3.13-1.6 GM/177ML PO SOLN: 1.0000 | Freq: Once | ORAL | 0 refills | Status: AC

## 2022-03-25 NOTE — Telephone Encounter (Signed)
Suprep has been sent to patients pharmacy.

## 2022-03-25 NOTE — Telephone Encounter (Signed)
Bound call from patient stating pharmacy has not received prep for upcoming procedure 03/27/22. Please give a call back to further advice.  Thank you

## 2022-03-26 ENCOUNTER — Ambulatory Visit: Payer: Managed Care, Other (non HMO)

## 2022-03-27 ENCOUNTER — Encounter: Payer: Self-pay | Admitting: Gastroenterology

## 2022-03-27 ENCOUNTER — Ambulatory Visit (AMBULATORY_SURGERY_CENTER): Payer: Managed Care, Other (non HMO) | Admitting: Gastroenterology

## 2022-03-27 VITALS — BP 124/63 | HR 68 | Temp 98.5°F | Resp 13 | Ht 67.0 in | Wt 181.0 lb

## 2022-03-27 DIAGNOSIS — K2950 Unspecified chronic gastritis without bleeding: Secondary | ICD-10-CM | POA: Diagnosis not present

## 2022-03-27 DIAGNOSIS — R634 Abnormal weight loss: Secondary | ICD-10-CM | POA: Diagnosis not present

## 2022-03-27 DIAGNOSIS — R112 Nausea with vomiting, unspecified: Secondary | ICD-10-CM

## 2022-03-27 DIAGNOSIS — D128 Benign neoplasm of rectum: Secondary | ICD-10-CM | POA: Diagnosis not present

## 2022-03-27 DIAGNOSIS — K5730 Diverticulosis of large intestine without perforation or abscess without bleeding: Secondary | ICD-10-CM | POA: Diagnosis not present

## 2022-03-27 DIAGNOSIS — K259 Gastric ulcer, unspecified as acute or chronic, without hemorrhage or perforation: Secondary | ICD-10-CM | POA: Diagnosis not present

## 2022-03-27 DIAGNOSIS — D509 Iron deficiency anemia, unspecified: Secondary | ICD-10-CM | POA: Diagnosis not present

## 2022-03-27 DIAGNOSIS — K2960 Other gastritis without bleeding: Secondary | ICD-10-CM | POA: Diagnosis not present

## 2022-03-27 HISTORY — PX: COLONOSCOPY WITH ESOPHAGOGASTRODUODENOSCOPY (EGD): SHX5779

## 2022-03-27 MED ORDER — OMEPRAZOLE 20 MG PO CPDR: 20.0000 mg | DELAYED_RELEASE_CAPSULE | Freq: Every day | ORAL | 0 refills | Status: DC

## 2022-03-27 MED ORDER — SODIUM CHLORIDE 0.9 % IV SOLN: 500.0000 mL | Freq: Once | INTRAVENOUS | Status: DC

## 2022-03-27 NOTE — Patient Instructions (Signed)
Handouts on polyps and diverticulosis given to you today  Avoid ASPIRIN, ASPIRIN CONTAINING PRODUCTS (BC OR GOODY POWDERS) OR NSAIDS (IBUPROFEN, ADVIL, ALEVE, AND MOTRIN); TYLENOL IS OK TO TAKE  Begin Protonix once a day for 4 weeks   YOU HAD AN ENDOSCOPIC PROCEDURE TODAY AT [facility]:   Refer to the procedure report that was given to you for any specific questions about what was found during the examination.  If the procedure report does not answer your questions, please call your gastroenterologist to clarify.  If you requested that your care partner not be given the details of your procedure findings, then the procedure report has been included in a sealed envelope for you to review at your convenience later.  YOU SHOULD EXPECT: Some feelings of bloating in the abdomen. Passage of more gas than usual.  Walking can help get rid of the air that was put into your GI tract during the procedure and reduce the bloating. If you had a lower endoscopy (such as a colonoscopy or flexible sigmoidoscopy) you may notice spotting of blood in your stool or on the toilet paper. If you underwent a bowel prep for your procedure, you may not have a normal bowel movement for a few days.  Please Note:  You might notice some irritation and congestion in your nose or some drainage.  This is from the oxygen used during your procedure.  There is no need for concern and it should clear up in a day or so.  SYMPTOMS TO REPORT IMMEDIATELY:  Following lower endoscopy (colonoscopy or flexible sigmoidoscopy):  Excessive amounts of blood in the stool  Significant tenderness or worsening of abdominal pains  Swelling of the abdomen that is new, acute  Fever of 100F or higher  Following upper endoscopy (EGD)  Vomiting of blood or coffee ground material  New chest pain or pain under the shoulder blades  Painful or persistently difficult swallowing  New shortness of breath  Fever of 100F or higher  Black,  tarry-looking stools  For urgent or emergent issues, a gastroenterologist can be reached at any hour by calling [phone]. Do not use MyChart messaging for urgent concerns.    DIET:  We do recommend a small meal at first, but then you may proceed to your regular diet.  Drink plenty of fluids but you should avoid alcoholic beverages for 24 hours.  ACTIVITY:  You should plan to take it easy for the rest of today and you should NOT DRIVE or use heavy machinery until tomorrow (because of the sedation medicines used during the test).    FOLLOW UP: Our staff will call the number listed on your records the next business day following your procedure.  We will call around 7:15- 8:00 am to check on you and address any questions or concerns that you may have regarding the information given to you following your procedure. If we do not reach you, we will leave a message.  If you develop any symptoms (ie: fever, flu-like symptoms, shortness of breath, cough etc.) before then, please call [phone].  If you test positive for Covid 19 in the 2 weeks post procedure, please call and report this information to [place].    If any biopsies were taken you will be contacted by phone or by letter within the next 1-3 weeks.  Please call us at [phone] if you have not heard about the biopsies in 3 weeks.    SIGNATURES/CONFIDENTIALITY: You and/or your care  partner have signed paperwork which will be entered into your electronic medical record.  These signatures attest to the fact that that the information above on your After Visit Summary has been reviewed and is understood.  Full responsibility of the confidentiality of this discharge information lies with you and/or your care-partner.  

## 2022-03-27 NOTE — Progress Notes (Signed)
Sedate, gd SR, tolerated procedure well, VSS, report to RN 

## 2022-03-27 NOTE — Progress Notes (Signed)
Called to room to assist during endoscopic procedure.  Patient ID and intended procedure confirmed with present staff. Received instructions for my participation in the procedure from the performing physician.  

## 2022-03-27 NOTE — Progress Notes (Signed)
VS completed by [facility].  Pt's states no medical or surgical changes since previsit or office visit.

## 2022-03-27 NOTE — Op Note (Signed)
[facility] Patient Name: Janice Arnold Procedure Date: 03/27/2022 2:31 PM MRN: [id number] Endoscopist: Nicki Reaper E. Candis Schatz , MD Age: 41 Referring MD:  Date of Birth: 1980-09-17 Gender: Female Account #: [id number] Procedure:                Upper GI endoscopy Indications:              Iron deficiency anemia, Nausea with vomiting Medicines:                Monitored Anesthesia Care Procedure:                Pre-Anesthesia Assessment:                           - Prior to the procedure, a History and Physical                            was performed, and patient medications and                            allergies were reviewed. The patient's tolerance of                            previous anesthesia was also reviewed. The risks                            and benefits of the procedure and the sedation                            options and risks were discussed with the patient.                            All questions were answered, and informed consent                            was obtained. Prior Anticoagulants: The patient has                            taken no previous anticoagulant or antiplatelet                            agents. ASA Grade Assessment: II - A patient with                            mild systemic disease. After reviewing the risks                            and benefits, the patient was deemed in                            satisfactory condition to undergo the procedure.                           After obtaining informed consent, the endoscope was  passed under direct vision. Throughout the                            procedure, the patient's blood pressure, pulse, and                            oxygen saturations were monitored continuously. The                            Endoscope was introduced through the mouth, and                            advanced to the second part of duodenum. The upper                            GI  endoscopy was accomplished without difficulty.                            The patient tolerated the procedure well. Scope In: Scope Out: Findings:                 The examined portions of the nasopharynx,                            oropharynx and larynx were normal.                           The Z-line was irregular.                           The exam of the esophagus was otherwise normal.                           Multiple localized small erosions with no bleeding                            and no stigmata of recent bleeding were found in                            the gastric antrum. Biopsies were taken with a cold                            forceps for Helicobacter pylori testing. Estimated                            blood loss was minimal.                           Diffuse mildly erythematous mucosa without bleeding                            was found in the gastric body. Biopsies were taken                            with a cold  forceps for Helicobacter pylori                            testing. Estimated blood loss was minimal.                           The examined duodenum was normal. Biopsies for                            histology were taken with a cold forceps for                            evaluation of celiac disease. Estimated blood loss                            was minimal. Complications:            No immediate complications. Estimated Blood Loss:     Estimated blood loss was minimal. Impression:               - The examined portions of the nasopharynx,                            oropharynx and larynx were normal.                           - Z-line irregular.                           - Erosive gastropathy with no bleeding and no                            stigmata of recent bleeding. Biopsied. These                            findings may be a source of nausea/vomiting                           - Erythematous mucosa in the gastric body. Biopsied.                            - Normal examined duodenum. Biopsied. Recommendation:           - Patient has a contact number available for                            emergencies. The signs and symptoms of potential                            delayed complications were discussed with the                            patient. Return to normal activities tomorrow.                            Written discharge instructions were provided to the  patient.                           - Resume previous diet.                           - Continue present medications.                           - Await pathology results.                           - Use Prilosec (omeprazole) 20 mg PO daily for 4                            weeks.                           - Avoid NSAIDs Gladys Deckard E. Candis Schatz, MD 03/27/2022 3:51:21 PM This report has been signed electronically.

## 2022-03-27 NOTE — Progress Notes (Signed)
[facility] History and Physical   Primary Care Physician:  Tawnya Crook, MD   Reason for Procedure:   Unintentional weight loss, nausea/vomiting, iron deficiency anemia  Plan:    EGD, colonoscopy     HPI: Janice Arnold is a 41 y.o. female undergoing EGD and colonoscopy to evaluate unintentional weight loss, nausea/vomiting, and iron deficiency anemia.  She was seen in clinic July 13th and a CT scan July 17th showed evidence of appendicitis, but as the patient was not having abdominal pain she did not present to the ED as recommended.  On the evening of July 21st, however, she did develop abdominal pain and a repeat CT scan again showed appendicitis.  She underwent an uneventful laparascopic appendectomy on July 22nd.   Past Medical History:  Diagnosis Date   Alcohol abuse    Anemia    History of laparoscopic appendectomy 03/02/2022   Vitamin deficiency     Past Surgical History:  Procedure Laterality Date   APPENDECTOMY  03/02/2022   LAPAROSCOPIC APPENDECTOMY N/A 03/02/2022   Procedure: APPENDECTOMY LAPAROSCOPIC;  Surgeon: Donnie Mesa, MD;  Location: [facility];  Service: General;  Laterality: N/A;    Prior to Admission medications   Medication Sig Start Date End Date Taking? Authorizing Provider  acetaminophen (TYLENOL) 500 MG tablet Take 1,000 mg by mouth 2 (two) times daily as needed (pain).   Yes [provider]  escitalopram (LEXAPRO) 20 MG tablet Take 1 tablet (20 mg total) by mouth daily. 03/04/22  Yes Tawnya Crook, MD  hydrochlorothiazide (HYDRODIURIL) 25 MG tablet Take 1 tablet (25 mg total) by mouth daily. 02/06/22  Yes Tawnya Crook, MD  KLOR-CON M20 20 MEQ tablet TAKE 1 TABLET BY MOUTH TWICE A DAY 03/25/22  Yes Tawnya Crook, MD  ondansetron (ZOFRAN-ODT) 4 MG disintegrating tablet Take 1 tablet (4 mg total) by mouth every 8 (eight) hours as needed for nausea or vomiting. 03/04/22  Yes Tawnya Crook, MD  hydrOXYzine (ATARAX) 10 MG  tablet Take 1 tablet (10 mg total) by mouth 3 (three) times daily as needed for itching. 03/04/22   Tawnya Crook, MD  ibuprofen (ADVIL) 200 MG tablet Take 400 mg by mouth See admin instructions. Take 400 mg by mouth 2-3 times a day as needed for pain    [provider]  OVER THE COUNTER MEDICATION Take 2,500 mcg by mouth daily. Vitamin B-12 2500 mcg    [provider]  oxyCODONE (OXY IR/ROXICODONE) 5 MG immediate release tablet Take 1 tablet (5 mg total) by mouth every 6 (six) hours as needed for severe pain. Patient not taking: Reported on 03/27/2022 03/02/22   Donnie Mesa, MD  PRESCRIPTION MEDICATION Inject into the vein once a week. Iron infusion    [provider]  Vitamin D, Ergocalciferol, (DRISDOL) 1.25 MG (50000 UNIT) CAPS capsule Take 1 capsule (50,000 Units total) by mouth every 7 (seven) days. 03/11/22   Tawnya Crook, MD    Current Outpatient Medications  Medication Sig Dispense Refill   acetaminophen (TYLENOL) 500 MG tablet Take 1,000 mg by mouth 2 (two) times daily as needed (pain).     escitalopram (LEXAPRO) 20 MG tablet Take 1 tablet (20 mg total) by mouth daily. 90 tablet 1   hydrochlorothiazide (HYDRODIURIL) 25 MG tablet Take 1 tablet (25 mg total) by mouth daily. 90 tablet 3   KLOR-CON M20 20 MEQ tablet TAKE 1 TABLET BY MOUTH TWICE A DAY 60 tablet 1   ondansetron (  ZOFRAN-ODT) 4 MG disintegrating tablet Take 1 tablet (4 mg total) by mouth every 8 (eight) hours as needed for nausea or vomiting. 60 tablet 3   hydrOXYzine (ATARAX) 10 MG tablet Take 1 tablet (10 mg total) by mouth 3 (three) times daily as needed for itching. 45 tablet 1   ibuprofen (ADVIL) 200 MG tablet Take 400 mg by mouth See admin instructions. Take 400 mg by mouth 2-3 times a day as needed for pain     OVER THE COUNTER MEDICATION Take 2,500 mcg by mouth daily. Vitamin B-12 2500 mcg     oxyCODONE (OXY IR/ROXICODONE) 5 MG immediate release tablet Take 1 tablet (5 mg total) by mouth  every 6 (six) hours as needed for severe pain. (Patient not taking: Reported on 03/27/2022) 21 tablet 0   PRESCRIPTION MEDICATION Inject into the vein once a week. Iron infusion     Vitamin D, Ergocalciferol, (DRISDOL) 1.25 MG (50000 UNIT) CAPS capsule Take 1 capsule (50,000 Units total) by mouth every 7 (seven) days. 12 capsule 0   Current Facility-Administered Medications  Medication Dose Route Frequency Provider Last Rate Last Admin   0.9 %  sodium chloride infusion  500 mL Intravenous Once Daryel November, MD        Allergies as of 03/27/2022   (No Known Allergies)    Family History  Problem Relation Age of Onset   Breast cancer Mother    Cervical cancer Mother        ovarian   Bone cancer Mother    Breast cancer Maternal Grandmother    Colon cancer Maternal Great-grandmother 38   Esophageal cancer Neg Hx    Stomach cancer Neg Hx     Social History   Socioeconomic History   Marital status: Married    Spouse name: Not on file   Number of children: 3   Years of education: Not on file   Highest education level: Not on file  Occupational History   Not on file  Tobacco Use   Smoking status: Never   Smokeless tobacco: Never  Vaping Use   Vaping Use: Never used  Substance and Sexual Activity   Alcohol use: Not Currently    Alcohol/week: 21.0 standard drinks of alcohol    Types: 21 Glasses of wine per week    Comment: 8 drinks daily - recently stopped   Drug use: Never   Sexual activity: Yes  Other Topics Concern   Not on file  Social History Narrative   Project mgr   Social Determinants of Health   Financial Resource Strain: Not on file  Food Insecurity: Not on file  Transportation Needs: Not on file  Physical Activity: Not on file  Stress: Not on file  Social Connections: Not on file  Intimate Partner Violence: Not on file    Review of Systems:  All other review of systems negative except as mentioned in the HPI.  Physical Exam: Vital signs BP  123/84   Pulse 80   Temp 98.5 F (36.9 C) (Temporal)   Ht '5\' 7"'$  (1.702 m)   Wt 181 lb (82.1 kg)   LMP 02/09/2022 (Approximate) Comment: negative HCG blood test 03-02-2022  SpO2 98%   BMI 28.35 kg/m   General:   Alert,  Well-developed, well-nourished, pleasant and cooperative in NAD Airway:  Mallampati 2 Lungs:  Clear throughout to auscultation.   Heart:  Regular rate and rhythm; no murmurs, clicks, rubs,  or gallops. Abdomen:  Soft, nontender and nondistended. Normal  bowel sounds.  Healing lap port incision scars Neuro/Psych:  Normal mood and affect. A and O x 3   Camden Knotek E. Candis Schatz, MD [facility]

## 2022-03-27 NOTE — Op Note (Signed)
[place] Patient Name: Janice Arnold Procedure Date: 03/27/2022 2:29 PM MRN: [id number] Endoscopist: Nicki Reaper E. Candis Schatz , MD Age: 41 Referring MD:  Date of Birth: 1980/08/13 Gender: Female Account #: [id number] Procedure:                Colonoscopy Indications:              Iron deficiency anemia, Weight loss Medicines:                Monitored Anesthesia Care Procedure:                Pre-Anesthesia Assessment:                           - Prior to the procedure, a History and Physical                            was performed, and patient medications and                            allergies were reviewed. The patient's tolerance of                            previous anesthesia was also reviewed. The risks                            and benefits of the procedure and the sedation                            options and risks were discussed with the patient.                            All questions were answered, and informed consent                            was obtained. Prior Anticoagulants: The patient has                            taken no previous anticoagulant or antiplatelet                            agents. ASA Grade Assessment: II - A patient with                            mild systemic disease. After reviewing the risks                            and benefits, the patient was deemed in                            satisfactory condition to undergo the procedure.                           After obtaining informed consent, the colonoscope  was passed under direct vision. Throughout the                            procedure, the patient's blood pressure, pulse, and                            oxygen saturations were monitored continuously. The                            Colonoscope was introduced through the anus and                            advanced to the the terminal ileum, with                            identification of the  appendiceal orifice and IC                            valve. The colonoscopy was performed without                            difficulty. The patient tolerated the procedure                            well. The quality of the bowel preparation was                            adequate. The terminal ileum, ileocecal valve,                            appendiceal orifice, and rectum were photographed.                            The bowel preparation used was SUPREP via split                            dose instruction. Scope In: 3:18:47 PM Scope Out: 3:37:47 PM Scope Withdrawal Time: 0 hours 14 minutes 21 seconds  Total Procedure Duration: 0 hours 19 minutes 0 seconds  Findings:                 The perianal and digital rectal examinations were                            normal. Pertinent negatives include normal                            sphincter tone and no palpable rectal lesions.                           A few small-mouthed diverticula were found in the                            ascending colon.  A 8 mm polyp was found in the distal rectum. The                            polyp was sessile. The polyp was removed with a                            cold snare. Resection and retrieval were complete.                            Estimated blood loss was minimal.                           The exam was otherwise normal throughout the                            examined colon.                           The terminal ileum appeared normal.                           The retroflexed view of the distal rectum and anal                            verge was normal and showed no anal or rectal                            abnormalities. Complications:            No immediate complications. Estimated Blood Loss:     Estimated blood loss was minimal. Impression:               - Diverticulosis in the ascending colon.                           - One 8 mm polyp in the distal rectum,  removed with                            a cold snare. Resected and retrieved.                           - The examined portion of the ileum was normal.                           - The distal rectum and anal verge are normal on                            retroflexion view.                           - No abnormalities to explain weight loss or iron                            deficiency anemia. Suspect patient's anemia likely  multifactorial, from menorrhagia and alcohol Recommendation:           - Patient has a contact number available for                            emergencies. The signs and symptoms of potential                            delayed complications were discussed with the                            patient. Return to normal activities tomorrow.                            Written discharge instructions were provided to the                            patient.                           - Resume previous diet.                           - Continue present medications.                           - Await pathology results.                           - Repeat colonoscopy (date not yet determined) for                            surveillance based on pathology results. Idolina Mantell E. Candis Schatz, MD 03/27/2022 4:00:33 PM This report has been signed electronically.

## 2022-03-28 ENCOUNTER — Telehealth: Payer: Self-pay

## 2022-03-28 NOTE — Telephone Encounter (Signed)
Left message for pt to call with any questions or concerns regarding her recovery.

## 2022-04-01 ENCOUNTER — Ambulatory Visit: Payer: Managed Care, Other (non HMO) | Admitting: Family Medicine

## 2022-04-02 NOTE — Progress Notes (Signed)
Janice Arnold,  The biopsies taken from your stomach were notable for mild chronic gastritis (inflammation) which is a common finding, but there was no evidence of Helicobacter pylori infection. Gastritis can be caused by many things to include  medications and and alcohol.  Please take the omeprazole as prescribed for a month and see how your symptoms do.  The biopsies of your duodenum were unremarkable.  There is no evidence of celiac disease.  The polyp which I removed during your colonoscopy was proven to be completely benign but is considered a "pre-cancerous" polyp that MAY have grown into cancer if it had not been removed.  Studies shows that at least 20% of women over age 71 and 30% of men over age 70 have pre-cancerous polyps.  Based on current nationally recognized surveillance guidelines, I recommend that you have a repeat colonoscopy in 7 years.   If you develop any new rectal bleeding, abdominal pain or significant bowel habit changes, please contact me before then.

## 2022-04-08 ENCOUNTER — Inpatient Hospital Stay: Payer: Managed Care, Other (non HMO) | Admitting: Family

## 2022-04-08 ENCOUNTER — Inpatient Hospital Stay: Payer: Managed Care, Other (non HMO)

## 2022-04-10 ENCOUNTER — Telehealth: Payer: Self-pay | Admitting: Family Medicine

## 2022-04-11 ENCOUNTER — Encounter: Payer: Self-pay | Admitting: Neurology

## 2022-04-11 ENCOUNTER — Encounter: Payer: Self-pay | Admitting: Family Medicine

## 2022-04-11 ENCOUNTER — Other Ambulatory Visit: Payer: Managed Care, Other (non HMO)

## 2022-04-11 ENCOUNTER — Ambulatory Visit (INDEPENDENT_AMBULATORY_CARE_PROVIDER_SITE_OTHER): Payer: Managed Care, Other (non HMO) | Admitting: Family Medicine

## 2022-04-11 VITALS — BP 116/72 | HR 88 | Temp 98.1°F | Ht 67.0 in | Wt 183.1 lb

## 2022-04-11 DIAGNOSIS — D128 Benign neoplasm of rectum: Secondary | ICD-10-CM | POA: Diagnosis not present

## 2022-04-11 DIAGNOSIS — R634 Abnormal weight loss: Secondary | ICD-10-CM | POA: Diagnosis not present

## 2022-04-11 DIAGNOSIS — B9689 Other specified bacterial agents as the cause of diseases classified elsewhere: Secondary | ICD-10-CM

## 2022-04-11 DIAGNOSIS — D129 Benign neoplasm of anus and anal canal: Secondary | ICD-10-CM

## 2022-04-11 DIAGNOSIS — Z23 Encounter for immunization: Secondary | ICD-10-CM | POA: Diagnosis not present

## 2022-04-11 DIAGNOSIS — D509 Iron deficiency anemia, unspecified: Secondary | ICD-10-CM

## 2022-04-11 DIAGNOSIS — E538 Deficiency of other specified B group vitamins: Secondary | ICD-10-CM

## 2022-04-11 DIAGNOSIS — R7401 Elevation of levels of liver transaminase levels: Secondary | ICD-10-CM

## 2022-04-11 DIAGNOSIS — N760 Acute vaginitis: Secondary | ICD-10-CM

## 2022-04-11 DIAGNOSIS — R112 Nausea with vomiting, unspecified: Secondary | ICD-10-CM

## 2022-04-11 DIAGNOSIS — F4323 Adjustment disorder with mixed anxiety and depressed mood: Secondary | ICD-10-CM

## 2022-04-11 DIAGNOSIS — E559 Vitamin D deficiency, unspecified: Secondary | ICD-10-CM | POA: Diagnosis not present

## 2022-04-11 DIAGNOSIS — R42 Dizziness and giddiness: Secondary | ICD-10-CM

## 2022-04-11 LAB — COMPREHENSIVE METABOLIC PANEL
ALT: 56 U/L — ABNORMAL HIGH (ref 0–35)
AST: 63 U/L — ABNORMAL HIGH (ref 0–37)
Albumin: 4.1 g/dL (ref 3.5–5.2)
Alkaline Phosphatase: 57 U/L (ref 39–117)
BUN: 9 mg/dL (ref 6–23)
CO2: 30 mEq/L (ref 19–32)
Calcium: 9 mg/dL (ref 8.4–10.5)
Chloride: 102 mEq/L (ref 96–112)
Creatinine, Ser: 0.5 mg/dL (ref 0.40–1.20)
GFR: 117.06 mL/min (ref >60.00)
Glucose, Bld: 107 mg/dL — ABNORMAL HIGH (ref 70–99)
Potassium: 3.1 mEq/L — ABNORMAL LOW (ref 3.5–5.1)
Sodium: 143 mEq/L (ref 135–145)
Total Bilirubin: 0.5 mg/dL (ref 0.2–1.2)
Total Protein: 7.5 g/dL (ref 6.0–8.3)

## 2022-04-11 LAB — VITAMIN D 25 HYDROXY (VIT D DEFICIENCY, FRACTURES): VITD: 30.65 ng/mL (ref 30.00–100.00)

## 2022-04-11 LAB — CBC WITH DIFFERENTIAL/PLATELET
Basophils Absolute: 0.1 10*3/uL (ref 0.0–0.1)
Basophils Relative: 1.3 % (ref 0.0–3.0)
Eosinophils Absolute: 0.1 10*3/uL (ref 0.0–0.7)
Eosinophils Relative: 1.7 % (ref 0.0–5.0)
HCT: 35.2 % — ABNORMAL LOW (ref 36.0–46.0)
Hemoglobin: 11.5 g/dL — ABNORMAL LOW (ref 12.0–15.0)
Lymphocytes Relative: 54.1 % — ABNORMAL HIGH (ref 12.0–46.0)
Lymphs Abs: 2.4 10*3/uL (ref 0.7–4.0)
MCHC: 32.6 g/dL (ref 30.0–36.0)
MCV: 89.3 fl (ref 78.0–100.0)
Monocytes Absolute: 0.4 10*3/uL (ref 0.1–1.0)
Monocytes Relative: 10.1 % (ref 3.0–12.0)
Neutro Abs: 1.4 10*3/uL (ref 1.4–7.7)
Neutrophils Relative %: 32.8 % — ABNORMAL LOW (ref 43.0–77.0)
Platelets: 235 10*3/uL (ref 150.0–400.0)
RBC: 3.94 Mil/uL (ref 3.87–5.11)
RDW: 20.2 % — ABNORMAL HIGH (ref 11.5–15.5)
WBC: 4.4 10*3/uL (ref 4.0–10.5)

## 2022-04-11 LAB — IBC + FERRITIN
Ferritin: 107.6 ng/mL (ref 10.0–291.0)
Iron: 54 ug/dL (ref 42–145)
Saturation Ratios: 15.7 % — ABNORMAL LOW (ref 20.0–50.0)
TIBC: 343 ug/dL (ref 250.0–450.0)
Transferrin: 245 mg/dL (ref 212.0–360.0)

## 2022-04-11 LAB — MAGNESIUM: Magnesium: 1.5 mg/dL (ref 1.5–2.5)

## 2022-04-11 MED ORDER — OMEPRAZOLE 40 MG PO CPDR: 40.0000 mg | DELAYED_RELEASE_CAPSULE | Freq: Every day | ORAL | 1 refills | Status: DC

## 2022-04-11 MED ORDER — METRONIDAZOLE 500 MG PO TABS: 500.0000 mg | ORAL_TABLET | Freq: Two times a day (BID) | ORAL | 0 refills | Status: AC

## 2022-04-11 MED ORDER — POTASSIUM CHLORIDE CRYS ER 20 MEQ PO TBCR: 20.0000 meq | EXTENDED_RELEASE_TABLET | Freq: Two times a day (BID) | ORAL | 1 refills | Status: DC

## 2022-04-11 MED ORDER — HYDROXYZINE HCL 10 MG PO TABS: 10.0000 mg | ORAL_TABLET | Freq: Three times a day (TID) | ORAL | 3 refills | Status: DC | PRN

## 2022-04-11 MED ORDER — CYANOCOBALAMIN 1000 MCG/ML IJ SOLN
1000.0000 ug | Freq: Once | INTRAMUSCULAR | Status: AC
  Administered 2022-04-11: 1000 ug via INTRAMUSCULAR

## 2022-04-11 NOTE — Patient Instructions (Signed)
It was very nice to see you today!  Start exercising   PLEASE NOTE:  If you had any lab tests please let us know if you have not heard back within a few days. You may see your results on MyChart before we have a chance to review them but we will give you a call once they are reviewed by [place]. If we ordered any referrals today, please let us know if you have not heard from their office within the next week.   Please try these tips to maintain a healthy lifestyle:  Eat most of your calories during the day when you are active. Eliminate processed foods including packaged sweets (pies, cakes, cookies), reduce intake of potatoes, white bread, white pasta, and white rice. Look for whole grain options, oat flour or almond flour.  Each meal should contain half fruits/vegetables, one quarter protein, and one quarter carbs (no bigger than a computer mouse).  Cut down on sweet beverages. This includes juice, soda, and sweet tea. Also watch fruit intake, though this is a healthier sweet option, it still contains natural sugar! Limit to 3 servings daily.  Drink at least 1 glass of water with each meal and aim for at least 8 glasses per day  Exercise at least 150 minutes every week.

## 2022-04-11 NOTE — Progress Notes (Signed)
Subjective:     Patient ID: Janice Arnold, female    DOB: [birth year], 41 y.o.   MRN: [id number]  Chief Complaint  Patient presents with   Follow-up    4 week follow-up     HPI  Adjustment disorder-mixed.  On Lexapro '20mg'$ .  Doesn't feel ready to go back to work. Too much stress.  No energy   no SI.  2  IDA and B12 def-on B12 injections and iron infusions done now.  Sees heme in 1 wk.  Cscope-1 polyp and upper GI-OK.  Saw gyn-replaced IUD but still heavy periods.  3.  Cyclic vomiting.  Off ETOH since June-last wk was trying to come back, but zofran kept away.  Lasted for 4 days and husb stated pale.  Fatigue, dizziness  not on menses. No diarrhea.  Feels can't do anything during them.  No HA.  Just dizziness.   4.  bV-on menses-thin, white d/c w/odor last wk.  Did pills from gyn last time.  Marland Kitchen   Health Maintenance Due  Topic Date Due   MAMMOGRAM  04/29/2020    Past Medical History:  Diagnosis Date   Alcohol abuse    Anemia    History of laparoscopic appendectomy 03/02/2022   Vitamin deficiency     Past Surgical History:  Procedure Laterality Date   APPENDECTOMY  03/02/2022   LAPAROSCOPIC APPENDECTOMY N/A 03/02/2022   Procedure: APPENDECTOMY LAPAROSCOPIC;  Surgeon: Donnie Mesa, MD;  Location: [facility];  Service: General;  Laterality: N/A;    Outpatient Medications Prior to Visit  Medication Sig Dispense Refill   acetaminophen (TYLENOL) 500 MG tablet Take 1,000 mg by mouth 2 (two) times daily as needed (pain).     escitalopram (LEXAPRO) 20 MG tablet Take 1 tablet (20 mg total) by mouth daily. 90 tablet 1   hydrochlorothiazide (HYDRODIURIL) 25 MG tablet Take 1 tablet (25 mg total) by mouth daily. 90 tablet 3   ibuprofen (ADVIL) 200 MG tablet Take 400 mg by mouth See admin instructions. Take 400 mg by mouth 2-3 times a day as needed for pain     ondansetron (ZOFRAN-ODT) 4 MG disintegrating tablet Take 1 tablet (4 mg total) by mouth every 8 (eight) hours as needed for nausea  or vomiting. 60 tablet 3   OVER THE COUNTER MEDICATION Take 2,500 mcg by mouth daily. Vitamin B-12 2500 mcg     PRESCRIPTION MEDICATION Inject into the vein once a week. Iron infusion     Vitamin D, Ergocalciferol, (DRISDOL) 1.25 MG (50000 UNIT) CAPS capsule Take 1 capsule (50,000 Units total) by mouth every 7 (seven) days. 12 capsule 0   hydrOXYzine (ATARAX) 10 MG tablet Take 1 tablet (10 mg total) by mouth 3 (three) times daily as needed for itching. 45 tablet 1   KLOR-CON M20 20 MEQ tablet TAKE 1 TABLET BY MOUTH TWICE A DAY 60 tablet 1   omeprazole (PRILOSEC) 20 MG capsule Take 1 capsule (20 mg total) by mouth daily. (Patient not taking: Reported on 04/11/2022) 90 capsule 0   Facility-Administered Medications Prior to Visit  Medication Dose Route Frequency Provider Last Rate Last Admin   0.9 %  sodium chloride infusion  500 mL Intravenous Once Daryel November, MD        No Known Allergies ROS neg/noncontributory except as noted HPI/below      Objective:     BP 116/72   Pulse 88   Temp 98.1 F (36.7 C) (Temporal)   Ht '5\' 7"'$  (  1.702 m)   Wt 183 lb 2 oz (83.1 kg)   SpO2 99%   BMI 28.68 kg/m  Wt Readings from Last 3 Encounters:  04/11/22 183 lb 2 oz (83.1 kg)  03/27/22 181 lb (82.1 kg)  03/04/22 185 lb (83.9 kg)    Physical Exam   Gen: WDWN NAD.  Tired WF HEENT: NCAT, conjunctiva not injected, sclera nonicteric MSK: no gross abnormalities.  NEURO: A&O x3.  CN II-XII intact.  PSYCH: normal mood. Good eye contact     Assessment & Plan:   Problem List Items Addressed This Visit       Other   Vitamin D deficiency   Relevant Orders   VITAMIN D 25 Hydroxy (Vit-D Deficiency, Fractures)   Transaminitis   Relevant Orders   Comprehensive metabolic panel   Magnesium   Other Visit Diagnoses     Iron deficiency anemia, unspecified iron deficiency anemia type    -  Primary   Relevant Medications   cyanocobalamin (VITAMIN B12) injection 1,000 mcg (Completed)    omeprazole (PRILOSEC) 40 MG capsule   Other Relevant Orders   Comprehensive metabolic panel   CBC with Differential/Platelet   Magnesium   IBC + Ferritin   B12 deficiency       Relevant Medications   cyanocobalamin (VITAMIN B12) injection 1,000 mcg (Completed)   Need for hepatitis B vaccination       Relevant Orders   Hepatitis B vaccine adult IM (Completed)   Loss of weight       Relevant Medications   omeprazole (PRILOSEC) 40 MG capsule   Benign neoplasm of rectum and anal canal       Relevant Medications   omeprazole (PRILOSEC) 40 MG capsule   Adjustment disorder with mixed anxiety and depressed mood       Bacterial vaginosis       Relevant Medications   metroNIDAZOLE (FLAGYL) 500 MG tablet   Nausea and vomiting, unspecified vomiting type          Adjustment disorder-mixed-much better on lexapro '20mg'$  and hydroxyzine '10mg'$  tid prn.  Continue.  Stress of going back to work and medical issues getting her more anxious and not ready.  Off 1 mo more to stabilize.  Consider psych if stress not improving Bacterial vaginosis prob.  Will do flagyl '500mg'$  bid x 7d.  If recurs/not work, f/u gyn Iron def anemia and B12 deficiency-chronic but improving.  B12 monthly injections.  Done w/iron infusions-has f/u w/heme next wk.  Check cbc, iron.  GI w/u unrevealing.  Still heavy menses-new IUD.  F/u gyn/heme.  Off work 1 mo  Transaminitis-?from ETOH, other.  No ETOH since June-repeat cmp Edema-improved on hctz-continue.  Check bmp Vitamin D def-chronic.  On weekly.  Check D Cyclic vomiting/dizziness-not d/t ETOH.  GI w/u neg.  ?migraine varient, other-to neuro.  Keep logs.  Zofran stopped this time from vomiting, but still down for 4 days.  F/u 1 mo.     Meds ordered this encounter  Medications   cyanocobalamin (VITAMIN B12) injection 1,000 mcg   hydrOXYzine (ATARAX) 10 MG tablet    Sig: Take 1 tablet (10 mg total) by mouth 3 (three) times daily as needed for itching.    Dispense:  45 tablet     Refill:  3   potassium chloride SA (KLOR-CON M20) 20 MEQ tablet    Sig: Take 1 tablet (20 mEq total) by mouth 2 (two) times daily.    Dispense:  180 tablet  Refill:  1   omeprazole (PRILOSEC) 40 MG capsule    Sig: Take 1 capsule (40 mg total) by mouth daily.    Dispense:  90 capsule    Refill:  1   metroNIDAZOLE (FLAGYL) 500 MG tablet    Sig: Take 1 tablet (500 mg total) by mouth 2 (two) times daily for 7 days.    Dispense:  14 tablet    Refill:  0    Wellington Hampshire, MD

## 2022-04-17 ENCOUNTER — Ambulatory Visit: Payer: Managed Care, Other (non HMO)

## 2022-04-17 ENCOUNTER — Ambulatory Visit (INDEPENDENT_AMBULATORY_CARE_PROVIDER_SITE_OTHER): Payer: Managed Care, Other (non HMO)

## 2022-04-17 DIAGNOSIS — E538 Deficiency of other specified B group vitamins: Secondary | ICD-10-CM | POA: Diagnosis not present

## 2022-04-17 MED ORDER — CYANOCOBALAMIN 1000 MCG/ML IJ SOLN
1000.0000 ug | Freq: Once | INTRAMUSCULAR | Status: AC
  Administered 2022-04-17: 1000 ug via INTRAMUSCULAR

## 2022-04-17 NOTE — Progress Notes (Signed)
Pt tolerated b12 well. 

## 2022-04-19 ENCOUNTER — Inpatient Hospital Stay: Payer: Managed Care, Other (non HMO)

## 2022-04-19 ENCOUNTER — Encounter: Payer: Self-pay | Admitting: Family

## 2022-04-19 ENCOUNTER — Inpatient Hospital Stay: Payer: Managed Care, Other (non HMO) | Attending: Hematology & Oncology | Admitting: Family

## 2022-04-19 VITALS — BP 120/76 | HR 64 | Temp 97.6°F | Resp 17 | Wt 184.0 lb

## 2022-04-19 DIAGNOSIS — D500 Iron deficiency anemia secondary to blood loss (chronic): Secondary | ICD-10-CM | POA: Diagnosis not present

## 2022-04-19 DIAGNOSIS — D509 Iron deficiency anemia, unspecified: Secondary | ICD-10-CM | POA: Diagnosis present

## 2022-04-19 DIAGNOSIS — E538 Deficiency of other specified B group vitamins: Secondary | ICD-10-CM

## 2022-04-19 LAB — CBC WITH DIFFERENTIAL (CANCER CENTER ONLY)
Abs Immature Granulocytes: 0.02 10*3/uL (ref 0.00–0.07)
Basophils Absolute: 0.1 10*3/uL (ref 0.0–0.1)
Basophils Relative: 2 %
Eosinophils Absolute: 0.1 10*3/uL (ref 0.0–0.5)
Eosinophils Relative: 1 %
HCT: 36.4 % (ref 36.0–46.0)
Hemoglobin: 11.6 g/dL — ABNORMAL LOW (ref 12.0–15.0)
Immature Granulocytes: 0 %
Lymphocytes Relative: 51 %
Lymphs Abs: 3.4 10*3/uL (ref 0.7–4.0)
MCH: 28.9 pg (ref 26.0–34.0)
MCHC: 31.9 g/dL (ref 30.0–36.0)
MCV: 90.8 fL (ref 80.0–100.0)
Monocytes Absolute: 0.2 10*3/uL (ref 0.1–1.0)
Monocytes Relative: 4 %
Neutro Abs: 2.8 10*3/uL (ref 1.7–7.7)
Neutrophils Relative %: 42 %
Platelet Count: 202 10*3/uL (ref 150–400)
RBC: 4.01 MIL/uL (ref 3.87–5.11)
RDW: 18.3 % — ABNORMAL HIGH (ref 11.5–15.5)
WBC Count: 6.6 10*3/uL (ref 4.0–10.5)
nRBC: 0 % (ref 0.0–0.2)

## 2022-04-19 LAB — RETICULOCYTES
Immature Retic Fract: 5.8 % (ref 2.3–15.9)
RBC.: 3.98 MIL/uL (ref 3.87–5.11)
Retic Count, Absolute: 62.1 10*3/uL (ref 19.0–186.0)
Retic Ct Pct: 1.6 % (ref 0.4–3.1)

## 2022-04-19 LAB — FERRITIN: Ferritin: 69 ng/mL (ref 11–307)

## 2022-04-19 LAB — FOLATE: Folate: 1.8 ng/mL — ABNORMAL LOW (ref >5.9)

## 2022-04-19 LAB — VITAMIN B12: Vitamin B-12: 431 pg/mL (ref 180–914)

## 2022-04-19 NOTE — Progress Notes (Signed)
Hematology and Oncology Follow Up Visit  Janice Arnold [id number] Feb 16, 1981 41 y.o. 04/19/2022   Principle Diagnosis:  Iron deficiency anemia  B 12 deficiency  Current Therapy:   IV iron as indicated  B 12 injections with PCP   Interim History:  Ms. Janice Arnold is here today with her daughter for follow-up. She is still feeling fatigued and has occasional dizziness.  She was hospitalized and had emergency surgery to remove her appendix. She has occasional twinges of pain in the right lower quadrant of the abdomen. This comes and goes. No bloating or pain at this time.  She has not noted any obvious blood loss. No petechiae.  No fever, chills, n/v, cough, rash, SOB, chest pain, palpitations or changes in bowel or bladder habits.  No swelling, numbness or tingling in her extremities at this time.  No falls or syncope reported.  Appetite comes and goes. He is staying well hydrated. Her weight is stable at 184 lbs.   ECOG Performance Status: 1 - Symptomatic but completely ambulatory  Medications:  Allergies as of 04/19/2022   No Known Allergies      Medication List        Accurate as of April 19, 2022  2:57 PM. If you have any questions, ask your nurse or doctor.          acetaminophen 500 MG tablet Commonly known as: TYLENOL Take 1,000 mg by mouth 2 (two) times daily as needed (pain).   escitalopram 20 MG tablet Commonly known as: LEXAPRO Take 1 tablet (20 mg total) by mouth daily.   hydrochlorothiazide 25 MG tablet Commonly known as: HYDRODIURIL Take 1 tablet (25 mg total) by mouth daily.   hydrOXYzine 10 MG tablet Commonly known as: ATARAX Take 1 tablet (10 mg total) by mouth 3 (three) times daily as needed for itching.   ibuprofen 200 MG tablet Commonly known as: ADVIL Take 400 mg by mouth See admin instructions. Take 400 mg by mouth 2-3 times a day as needed for pain   melatonin 1 MG Tabs tablet Take 1 mg by mouth at bedtime as needed.   omeprazole 40 MG  capsule Commonly known as: PRILOSEC Take 1 capsule (40 mg total) by mouth daily.   ondansetron 4 MG disintegrating tablet Commonly known as: ZOFRAN-ODT Take 1 tablet (4 mg total) by mouth every 8 (eight) hours as needed for nausea or vomiting.   OVER THE COUNTER MEDICATION Take 2,500 mcg by mouth daily. Vitamin B-12 2500 mcg   potassium chloride SA 20 MEQ tablet Commonly known as: Klor-Con M20 Take 1 tablet (20 mEq total) by mouth 2 (two) times daily.   PRESCRIPTION MEDICATION Inject into the vein once a week. Iron infusion   VITAMIN B-12 IJ Inject as directed once a week.   Vitamin D (Ergocalciferol) 1.25 MG (50000 UNIT) Caps capsule Commonly known as: DRISDOL Take 1 capsule (50,000 Units total) by mouth every 7 (seven) days.        Allergies: No Known Allergies  Past Medical History, Surgical history, Social history, and Family History were reviewed and updated.  Review of Systems: All other 10 point review of systems is negative.   Physical Exam:  weight is 184 lb (83.5 kg). Her oral temperature is 97.6 F (36.4 C). Her blood pressure is 120/76 and her pulse is 64. Her respiration is 17 and oxygen saturation is 100%.   Wt Readings from Last 3 Encounters:  04/19/22 184 lb (83.5 kg)  04/11/22 183 lb 2  oz (83.1 kg)  03/27/22 181 lb (82.1 kg)    Ocular: Sclerae unicteric, pupils equal, round and reactive to light Ear-nose-throat: Oropharynx clear, dentition fair Lymphatic: No cervical or supraclavicular adenopathy Lungs no rales or rhonchi, good excursion bilaterally Heart regular rate and rhythm, no murmur appreciated Abd soft, nontender, positive bowel sounds MSK no focal spinal tenderness, no joint edema Neuro: non-focal, well-oriented, appropriate affect Breasts: Deferred   Lab Results  Component Value Date   WBC 6.6 04/19/2022   HGB 11.6 (L) 04/19/2022   HCT 36.4 04/19/2022   MCV 90.8 04/19/2022   PLT 202 04/19/2022   Lab Results  Component Value  Date   FERRITIN 107.6 04/11/2022   IRON 54 04/11/2022   TIBC 343.0 04/11/2022   UIBC 341 02/11/2022   IRONPCTSAT 15.7 (L) 04/11/2022   Lab Results  Component Value Date   RETICCTPCT 1.6 04/19/2022   RBC 3.98 04/19/2022   No results found for: "KPAFRELGTCHN", "LAMBDASER", "KAPLAMBRATIO" Lab Results  Component Value Date   IGGSERUM 1,195 02/21/2022   No results found for: "TOTALPROTELP", "ALBUMINELP", "A1GS", "A2GS", "BETS", "BETA2SER", "GAMS", "MSPIKE", "SPEI"   Chemistry      Component Value Date/Time   NA 143 04/11/2022 1356   K 3.1 (L) 04/11/2022 1356   CL 102 04/11/2022 1356   CO2 30 04/11/2022 1356   BUN 9 04/11/2022 1356   CREATININE 0.50 04/11/2022 1356   CREATININE 0.51 02/11/2022 1103      Component Value Date/Time   CALCIUM 9.0 04/11/2022 1356   ALKPHOS 57 04/11/2022 1356   AST 63 (H) 04/11/2022 1356   AST 66 (H) 02/11/2022 1103   ALT 56 (H) 04/11/2022 1356   ALT 30 02/11/2022 1103   BILITOT 0.5 04/11/2022 1356   BILITOT 1.4 (H) 02/11/2022 1103       Impression and Plan: [name] is a very pleasant 41 yo caucasian female with significant anemia noted over the last month. She was found to be B 12 and iron deficiency.  Iron studies are pending. We will replace if needed.  B 12 pending. She gets injections with PCP.  Follow-up in 3 months.   Lottie Dawson, NP 9/8/20232:57 PM

## 2022-04-22 LAB — IRON AND IRON BINDING CAPACITY (CC-WL,HP ONLY)
Iron: 29 ug/dL (ref 28–170)
Saturation Ratios: 8 % — ABNORMAL LOW (ref 10.4–31.8)
TIBC: 365 ug/dL (ref 250–450)
UIBC: 336 ug/dL (ref 148–442)

## 2022-04-24 ENCOUNTER — Telehealth: Payer: Self-pay | Admitting: *Deleted

## 2022-04-24 ENCOUNTER — Ambulatory Visit: Payer: Managed Care, Other (non HMO)

## 2022-04-24 NOTE — Telephone Encounter (Signed)
Per scheduling message Janice Arnold - called and lvm for a callback to schedule (3) doses of IV Iron 

## 2022-05-01 ENCOUNTER — Ambulatory Visit: Payer: Managed Care, Other (non HMO)

## 2022-05-07 DIAGNOSIS — Z0279 Encounter for issue of other medical certificate: Secondary | ICD-10-CM

## 2022-05-08 ENCOUNTER — Ambulatory Visit: Payer: Managed Care, Other (non HMO)

## 2022-05-09 ENCOUNTER — Ambulatory Visit (INDEPENDENT_AMBULATORY_CARE_PROVIDER_SITE_OTHER): Payer: Managed Care, Other (non HMO) | Admitting: Family Medicine

## 2022-05-09 ENCOUNTER — Encounter: Payer: Self-pay | Admitting: Family Medicine

## 2022-05-09 ENCOUNTER — Inpatient Hospital Stay: Payer: Managed Care, Other (non HMO)

## 2022-05-09 VITALS — BP 128/88 | HR 99 | Temp 98.0°F | Resp 17

## 2022-05-09 VITALS — BP 130/80 | HR 96 | Temp 97.7°F | Ht 67.0 in | Wt 182.1 lb

## 2022-05-09 DIAGNOSIS — D500 Iron deficiency anemia secondary to blood loss (chronic): Secondary | ICD-10-CM

## 2022-05-09 DIAGNOSIS — E663 Overweight: Secondary | ICD-10-CM

## 2022-05-09 DIAGNOSIS — D509 Iron deficiency anemia, unspecified: Secondary | ICD-10-CM | POA: Diagnosis not present

## 2022-05-09 DIAGNOSIS — Z6828 Body mass index (BMI) 28.0-28.9, adult: Secondary | ICD-10-CM

## 2022-05-09 DIAGNOSIS — R600 Localized edema: Secondary | ICD-10-CM | POA: Insufficient documentation

## 2022-05-09 DIAGNOSIS — E538 Deficiency of other specified B group vitamins: Secondary | ICD-10-CM

## 2022-05-09 DIAGNOSIS — N920 Excessive and frequent menstruation with regular cycle: Secondary | ICD-10-CM

## 2022-05-09 DIAGNOSIS — F4323 Adjustment disorder with mixed anxiety and depressed mood: Secondary | ICD-10-CM | POA: Diagnosis not present

## 2022-05-09 LAB — CBC
HCT: 30 % — ABNORMAL LOW (ref 36.0–46.0)
Hemoglobin: 10.2 g/dL — ABNORMAL LOW (ref 12.0–15.0)
MCHC: 34.1 g/dL (ref 30.0–36.0)
MCV: 94.4 fl (ref 78.0–100.0)
Platelets: 205 10*3/uL (ref 150.0–400.0)
RBC: 3.18 Mil/uL — ABNORMAL LOW (ref 3.87–5.11)
RDW: 22.5 % — ABNORMAL HIGH (ref 11.5–15.5)
WBC: 4.3 10*3/uL (ref 4.0–10.5)

## 2022-05-09 LAB — BASIC METABOLIC PANEL
BUN: 6 mg/dL (ref 6–23)
CO2: 29 mEq/L (ref 19–32)
Calcium: 9.1 mg/dL (ref 8.4–10.5)
Chloride: 99 mEq/L (ref 96–112)
Creatinine, Ser: 0.49 mg/dL (ref 0.40–1.20)
GFR: 117.57 mL/min (ref >60.00)
Glucose, Bld: 103 mg/dL — ABNORMAL HIGH (ref 70–99)
Potassium: 3 mEq/L — ABNORMAL LOW (ref 3.5–5.1)
Sodium: 138 mEq/L (ref 135–145)

## 2022-05-09 MED ORDER — SODIUM CHLORIDE 0.9 % IV SOLN
300.0000 mg | Freq: Once | INTRAVENOUS | Status: AC
  Administered 2022-05-09: 300 mg via INTRAVENOUS
  Filled 2022-05-09: qty 300

## 2022-05-09 MED ORDER — BUPROPION HCL ER (XL) 150 MG PO TB24: 150.0000 mg | ORAL_TABLET | Freq: Every day | ORAL | 1 refills | Status: DC

## 2022-05-09 MED ORDER — SODIUM CHLORIDE 0.9 % IV SOLN: Freq: Once | INTRAVENOUS | Status: AC

## 2022-05-09 MED ORDER — PHENTERMINE HCL 37.5 MG PO CAPS: 37.5000 mg | ORAL_CAPSULE | ORAL | 0 refills | Status: DC

## 2022-05-09 MED ORDER — CYANOCOBALAMIN 1000 MCG/ML IJ SOLN
1000.0000 ug | Freq: Once | INTRAMUSCULAR | Status: AC
  Administered 2022-05-09: 1000 ug via INTRAMUSCULAR

## 2022-05-09 NOTE — Patient Instructions (Addendum)
Folic acid '1mg'$ /day.  Check insurance on [employer].  Start wellbutrin for 1 wk before trying the phentermine.

## 2022-05-09 NOTE — Progress Notes (Signed)
Subjective:     Patient ID: Janice Arnold, female    DOB: 1981/04/10, 41 y.o.   MRN: [id number]  Chief Complaint  Patient presents with   Follow-up    4 week follow-up on moods Heavy periods, has been on for 6 weeks, seeing gyn Will need to start iron infusions soon Fasting     HPI  Moods-up and down.  Occ anxiety.  More down. No SI.  On lexapro '20mg'$  taking '10mg'$  bid. Was more irrit and couldn't think straight on '20mg'$  at once. No energy Heavy menses-seeing gyn.bleeding for 4 wks.  Blood all over the bed, clots, etc.  IUD fell out.  Went to [place] [employer].  Placed on aygestin '5mg'$ -not helping. Sees tomorrow for exam and u/s.  Anemia-getting iron infusions today. On B12 monthly injections-got today Edema-chronic.  Well-controlled on hydrochlorothiazide 25 mg daily   Health Maintenance Due  Topic Date Due   MAMMOGRAM  04/29/2020    Past Medical History:  Diagnosis Date   Alcohol abuse    Anemia    History of laparoscopic appendectomy 03/02/2022   Vitamin deficiency     Past Surgical History:  Procedure Laterality Date   APPENDECTOMY  03/02/2022   LAPAROSCOPIC APPENDECTOMY N/A 03/02/2022   Procedure: APPENDECTOMY LAPAROSCOPIC;  Surgeon: Donnie Mesa, MD;  Location: [facility];  Service: General;  Laterality: N/A;    Outpatient Medications Prior to Visit  Medication Sig Dispense Refill   acetaminophen (TYLENOL) 500 MG tablet Take 1,000 mg by mouth 2 (two) times daily as needed (pain).     Cyanocobalamin (VITAMIN B-12 IJ) Inject as directed once a week.     escitalopram (LEXAPRO) 20 MG tablet Take 1 tablet (20 mg total) by mouth daily. 90 tablet 1   hydrochlorothiazide (HYDRODIURIL) 25 MG tablet Take 1 tablet (25 mg total) by mouth daily. 90 tablet 3   hydrOXYzine (ATARAX) 10 MG tablet Take 1 tablet (10 mg total) by mouth 3 (three) times daily as needed for itching. 45 tablet 3   ibuprofen (ADVIL) 200 MG tablet Take 400 mg by mouth See admin instructions. Take 400 mg by mouth 2-3 times  a day as needed for pain     melatonin 1 MG TABS tablet Take 1 mg by mouth at bedtime as needed.     norethindrone (AYGESTIN) 5 MG tablet Take by mouth.     omeprazole (PRILOSEC) 40 MG capsule Take 1 capsule (40 mg total) by mouth daily. 90 capsule 1   ondansetron (ZOFRAN-ODT) 4 MG disintegrating tablet Take 1 tablet (4 mg total) by mouth every 8 (eight) hours as needed for nausea or vomiting. 60 tablet 3   OVER THE COUNTER MEDICATION Take 2,500 mcg by mouth daily. Vitamin B-12 2500 mcg     potassium chloride SA (KLOR-CON M20) 20 MEQ tablet Take 1 tablet (20 mEq total) by mouth 2 (two) times daily. 180 tablet 1   PRESCRIPTION MEDICATION Inject into the vein once a week. Iron infusion     Vitamin D, Ergocalciferol, (DRISDOL) 1.25 MG (50000 UNIT) CAPS capsule Take 1 capsule (50,000 Units total) by mouth every 7 (seven) days. 12 capsule 0   Facility-Administered Medications Prior to Visit  Medication Dose Route Frequency Provider Last Rate Last Admin   0.9 %  sodium chloride infusion  500 mL Intravenous Once Daryel November, MD        No Known Allergies ROS neg/noncontributory except as noted HPI/below Losing hair      Objective:  BP 130/80   Pulse 96   Temp 97.7 F (36.5 C) (Temporal)   Ht '5\' 7"'$  (1.702 m)   Wt 182 lb 2 oz (82.6 kg)   LMP 05/09/2022 (Exact Date) Comment: has been on for last 6 weeks  SpO2 99%   BMI 28.52 kg/m  Wt Readings from Last 3 Encounters:  05/09/22 182 lb 2 oz (82.6 kg)  04/19/22 184 lb (83.5 kg)  04/11/22 183 lb 2 oz (83.1 kg)    Physical Exam   Gen: WDWN NAD tired appearing HEENT: NCAT, conjunctiva not injected, sclera nonicteric CARDIAC: RRR, S1S2+, no murmur.  LUNGS: CTAB. No wheezes EXT:  no edema MSK: no gross abnormalities.  NEURO: A&O x3.  CN II-XII intact.  PSYCH: normal mood. Good eye contact     Assessment & Plan:   Problem List Items Addressed This Visit       Other   Iron deficiency anemia due to chronic blood loss -  Primary   Relevant Orders   CBC (Completed)   Local edema   Relevant Orders   Basic metabolic panel (Completed)   Adjustment disorder with mixed anxiety and depressed mood   Other Visit Diagnoses     B12 deficiency       Relevant Medications   cyanocobalamin (VITAMIN B12) injection 1,000 mcg (Completed)   Menorrhagia with regular cycle       Overweight with body mass index (BMI) of 28 to 28.9 in adult         1.  Iron deficiency anemia-suspect more so due to heavy menses.  She is also B12 deficient.  Getting B12 injections monthly.  Continue.  Seen hematology.  Getting iron infusions.  She has had GI work-up.  Needs to get the uterus under control.  As she has been having very heavy bleeding for the past month, will recheck CBC (which has been very stable) 2.  Menorrhagia-patient had an IUD placed that has fallen out.  Having a lot of cramps and clots.  She is seeing GYN tomorrow. 3.  Adjustment disorder-chronic.  Not ideal.  Currently more on the depressed side.  Continue Lexapro 10 mg twice daily.  Will add Wellbutrin XL 150 mg daily.  Follow-up in 1 months.  Note written for patient to work half days in order to help her recover from all of her other illnesses, but she has been stable, so I think she can go back to work but not full-time. 4.  Local edema-chronic.  Controlled on hydrochlorothiazide 25 mg daily.  Check BMP 5.  Overweight-has been morbidly obese in the past.  She has hit a plateau.  Finally medically more stable.  Checked PDMP.  Will start phentermine 37.5 mg daily.  Follow-up in 1 month  Meds ordered this encounter  Medications   cyanocobalamin (VITAMIN B12) injection 1,000 mcg   buPROPion (WELLBUTRIN XL) 150 MG 24 hr tablet    Sig: Take 1 tablet (150 mg total) by mouth daily.    Dispense:  30 tablet    Refill:  1   phentermine 37.5 MG capsule    Sig: Take 1 capsule (37.5 mg total) by mouth every morning.    Dispense:  30 capsule    Refill:  0    Wellington Hampshire,  MD

## 2022-05-09 NOTE — Patient Instructions (Signed)

## 2022-05-09 NOTE — Progress Notes (Signed)
Potassium is lower-the hctz is probably not good for her.  Try 1/2 tab/day.  Take potassium 2 tabs twice daily and repeat potassium in 1 week.  Also, hemoglobin has dropped some-she will be seeing gyn on 9/29.  Just got iron and B12 on 9/28.

## 2022-05-10 ENCOUNTER — Other Ambulatory Visit: Payer: Self-pay | Admitting: *Deleted

## 2022-05-10 DIAGNOSIS — E876 Hypokalemia: Secondary | ICD-10-CM

## 2022-05-14 NOTE — Telephone Encounter (Signed)
Called pt to get rescheduled per appointment request of either 08/30 or 08/31 @ anytime

## 2022-05-16 ENCOUNTER — Inpatient Hospital Stay: Payer: Managed Care, Other (non HMO) | Attending: Hematology & Oncology

## 2022-05-16 ENCOUNTER — Ambulatory Visit (INDEPENDENT_AMBULATORY_CARE_PROVIDER_SITE_OTHER): Payer: Managed Care, Other (non HMO) | Admitting: *Deleted

## 2022-05-16 ENCOUNTER — Other Ambulatory Visit (INDEPENDENT_AMBULATORY_CARE_PROVIDER_SITE_OTHER): Payer: Managed Care, Other (non HMO)

## 2022-05-16 VITALS — BP 126/79 | HR 105 | Temp 98.3°F | Resp 18

## 2022-05-16 DIAGNOSIS — Z23 Encounter for immunization: Secondary | ICD-10-CM | POA: Diagnosis not present

## 2022-05-16 DIAGNOSIS — D509 Iron deficiency anemia, unspecified: Secondary | ICD-10-CM | POA: Insufficient documentation

## 2022-05-16 DIAGNOSIS — E538 Deficiency of other specified B group vitamins: Secondary | ICD-10-CM | POA: Diagnosis not present

## 2022-05-16 DIAGNOSIS — D500 Iron deficiency anemia secondary to blood loss (chronic): Secondary | ICD-10-CM

## 2022-05-16 DIAGNOSIS — E876 Hypokalemia: Secondary | ICD-10-CM

## 2022-05-16 LAB — POTASSIUM: Potassium: 4.2 mEq/L (ref 3.5–5.1)

## 2022-05-16 MED ORDER — SODIUM CHLORIDE 0.9 % IV SOLN
300.0000 mg | Freq: Once | INTRAVENOUS | Status: AC
  Administered 2022-05-16: 300 mg via INTRAVENOUS
  Filled 2022-05-16: qty 300

## 2022-05-16 MED ORDER — SODIUM CHLORIDE 0.9 % IV SOLN: Freq: Once | INTRAVENOUS | Status: AC

## 2022-05-16 MED ORDER — CYANOCOBALAMIN 1000 MCG/ML IJ SOLN
1000.0000 ug | Freq: Once | INTRAMUSCULAR | Status: AC
  Administered 2022-05-16: 1000 ug via INTRAMUSCULAR

## 2022-05-16 NOTE — Progress Notes (Signed)
Patient refused to wait 30 minutes post infusion. Released stable and ASX. 

## 2022-05-16 NOTE — Progress Notes (Signed)
Per orders of Dr. Kulik, injection of B 12 given in left deltoid per patient preference by Bellarae Lizer S Luay Balding, CMA. Patient tolerated injection well. Patient reminded to schedule next injection.  

## 2022-05-16 NOTE — Patient Instructions (Signed)

## 2022-05-17 ENCOUNTER — Other Ambulatory Visit: Payer: Managed Care, Other (non HMO)

## 2022-05-17 ENCOUNTER — Telehealth: Payer: Self-pay | Admitting: Family Medicine

## 2022-05-17 NOTE — Telephone Encounter (Signed)
Patient states: -Upon looking at new note sent, she noticed the date was still wrong.  -Instead of saying 05/12/22-05/18/22, it says 10-01- 08/18/21  Patient requests: - This be corrected as soon as possible

## 2022-05-17 NOTE — Telephone Encounter (Signed)
Note corrected

## 2022-05-17 NOTE — Telephone Encounter (Signed)
Letter corrected and sent through mychart.

## 2022-05-17 NOTE — Telephone Encounter (Signed)
Pt states: -Letter for work received on/after 05/09/22 has incorrect months; January (01) instead of October (10).   Pt Requests: -Correct letter to be created then sent to patient, via MyChart is acceptable. Please call with any questions.

## 2022-05-21 ENCOUNTER — Other Ambulatory Visit: Payer: Self-pay | Admitting: Family Medicine

## 2022-05-23 ENCOUNTER — Ambulatory Visit: Payer: Managed Care, Other (non HMO)

## 2022-05-23 ENCOUNTER — Inpatient Hospital Stay: Payer: Managed Care, Other (non HMO)

## 2022-05-28 ENCOUNTER — Ambulatory Visit (INDEPENDENT_AMBULATORY_CARE_PROVIDER_SITE_OTHER): Payer: Managed Care, Other (non HMO) | Admitting: Family Medicine

## 2022-05-28 ENCOUNTER — Observation Stay (HOSPITAL_COMMUNITY)
Admission: EM | Admit: 2022-05-28 | Discharge: 2022-05-29 | Disposition: A | Discharge status: Home / Self Care | Payer: Managed Care, Other (non HMO) | Attending: Internal Medicine | Admitting: Internal Medicine

## 2022-05-28 ENCOUNTER — Encounter (HOSPITAL_COMMUNITY): Payer: Self-pay

## 2022-05-28 ENCOUNTER — Encounter: Payer: Self-pay | Admitting: Family Medicine

## 2022-05-28 VITALS — BP 129/88 | HR 129 | Temp 97.7°F | Ht 67.0 in | Wt 174.5 lb

## 2022-05-28 DIAGNOSIS — R9431 Abnormal electrocardiogram [ECG] [EKG]: Secondary | ICD-10-CM | POA: Diagnosis present

## 2022-05-28 DIAGNOSIS — I10 Essential (primary) hypertension: Secondary | ICD-10-CM

## 2022-05-28 DIAGNOSIS — N939 Abnormal uterine and vaginal bleeding, unspecified: Secondary | ICD-10-CM

## 2022-05-28 DIAGNOSIS — R531 Weakness: Secondary | ICD-10-CM

## 2022-05-28 DIAGNOSIS — E878 Other disorders of electrolyte and fluid balance, not elsewhere classified: Secondary | ICD-10-CM

## 2022-05-28 DIAGNOSIS — N179 Acute kidney failure, unspecified: Secondary | ICD-10-CM

## 2022-05-28 DIAGNOSIS — R1110 Vomiting, unspecified: Secondary | ICD-10-CM

## 2022-05-28 DIAGNOSIS — R112 Nausea with vomiting, unspecified: Secondary | ICD-10-CM | POA: Diagnosis not present

## 2022-05-28 DIAGNOSIS — D500 Iron deficiency anemia secondary to blood loss (chronic): Secondary | ICD-10-CM

## 2022-05-28 DIAGNOSIS — R945 Abnormal results of liver function studies: Secondary | ICD-10-CM | POA: Diagnosis not present

## 2022-05-28 DIAGNOSIS — Z79899 Other long term (current) drug therapy: Secondary | ICD-10-CM | POA: Diagnosis not present

## 2022-05-28 DIAGNOSIS — Z1152 Encounter for screening for COVID-19: Secondary | ICD-10-CM | POA: Insufficient documentation

## 2022-05-28 DIAGNOSIS — E876 Hypokalemia: Secondary | ICD-10-CM | POA: Diagnosis present

## 2022-05-28 DIAGNOSIS — R7989 Other specified abnormal findings of blood chemistry: Secondary | ICD-10-CM | POA: Diagnosis present

## 2022-05-28 DIAGNOSIS — K219 Gastro-esophageal reflux disease without esophagitis: Secondary | ICD-10-CM | POA: Diagnosis present

## 2022-05-28 LAB — CBC
HCT: 34.7 % — ABNORMAL LOW (ref 36.0–46.0)
Hemoglobin: 12.2 g/dL (ref 12.0–15.0)
MCH: 37.2 pg — ABNORMAL HIGH (ref 26.0–34.0)
MCHC: 35.2 g/dL (ref 30.0–36.0)
MCV: 105.8 fL — ABNORMAL HIGH (ref 80.0–100.0)
Platelets: 160 10*3/uL (ref 150–400)
RBC: 3.28 MIL/uL — ABNORMAL LOW (ref 3.87–5.11)
WBC: 6.6 10*3/uL (ref 4.0–10.5)
nRBC: 0.3 % — ABNORMAL HIGH (ref 0.0–0.2)

## 2022-05-28 LAB — MAGNESIUM: Magnesium: 1.3 mg/dL — ABNORMAL LOW (ref 1.7–2.4)

## 2022-05-28 LAB — COMPREHENSIVE METABOLIC PANEL
ALT: 27 U/L (ref 0–44)
AST: 82 U/L — ABNORMAL HIGH (ref 15–41)
Albumin: 3.7 g/dL (ref 3.5–5.0)
Alkaline Phosphatase: 57 U/L (ref 38–126)
Anion gap: 19 — ABNORMAL HIGH (ref 5–15)
BUN: <5 mg/dL — ABNORMAL LOW (ref 6–20)
CO2: 25 mmol/L (ref 22–32)
Calcium: 8.8 mg/dL — ABNORMAL LOW (ref 8.9–10.3)
Chloride: 91 mmol/L — ABNORMAL LOW (ref 98–111)
Creatinine, Ser: 0.71 mg/dL (ref 0.44–1.00)
GFR, Estimated: >60 mL/min (ref >60)
Glucose, Bld: 103 mg/dL — ABNORMAL HIGH (ref 70–99)
Potassium: 2.1 mmol/L — CL (ref 3.5–5.1)
Sodium: 135 mmol/L (ref 135–145)
Total Bilirubin: 3.8 mg/dL — ABNORMAL HIGH (ref 0.3–1.2)
Total Protein: 8.4 g/dL — ABNORMAL HIGH (ref 6.5–8.1)

## 2022-05-28 LAB — TYPE AND SCREEN
ABO/RH(D): A POS
Antibody Screen: NEGATIVE

## 2022-05-28 LAB — PROTIME-INR
INR: 1.1 (ref 0.8–1.2)
Prothrombin Time: 14.4 seconds (ref 11.4–15.2)

## 2022-05-28 LAB — I-STAT BETA HCG BLOOD, ED (MC, WL, AP ONLY): I-stat hCG, quantitative: <5 m[IU]/mL (ref <5)

## 2022-05-28 LAB — LIPASE, BLOOD: Lipase: 49 U/L (ref 11–51)

## 2022-05-28 MED ORDER — LACTATED RINGERS IV BOLUS
1000.0000 mL | Freq: Once | INTRAVENOUS | Status: AC
  Administered 2022-05-28: 1000 mL via INTRAVENOUS

## 2022-05-28 MED ORDER — HALOPERIDOL LACTATE 5 MG/ML IJ SOLN
2.0000 mg | Freq: Once | INTRAMUSCULAR | Status: AC
  Administered 2022-05-28: 2 mg via INTRAVENOUS
  Filled 2022-05-28: qty 1

## 2022-05-28 MED ORDER — MAGNESIUM SULFATE 2 GM/50ML IV SOLN
2.0000 g | Freq: Once | INTRAVENOUS | Status: AC
  Administered 2022-05-28: 2 g via INTRAVENOUS
  Filled 2022-05-28: qty 50

## 2022-05-28 MED ORDER — POTASSIUM CHLORIDE CRYS ER 20 MEQ PO TBCR: 40.0000 meq | EXTENDED_RELEASE_TABLET | Freq: Two times a day (BID) | ORAL | 1 refills | Status: DC

## 2022-05-28 MED ORDER — METOCLOPRAMIDE HCL 5 MG/ML IJ SOLN
10.0000 mg | Freq: Once | INTRAMUSCULAR | Status: AC
  Administered 2022-05-28: 10 mg via INTRAVENOUS
  Filled 2022-05-28: qty 2

## 2022-05-28 MED ORDER — POTASSIUM CHLORIDE 10 MEQ/100ML IV SOLN
10.0000 meq | INTRAVENOUS | Status: AC
  Administered 2022-05-28 – 2022-05-29 (×4): 10 meq via INTRAVENOUS
  Filled 2022-05-28 (×4): qty 100

## 2022-05-28 MED ORDER — ONDANSETRON 4 MG PO TBDP: 4.0000 mg | ORAL_TABLET | Freq: Three times a day (TID) | ORAL | 3 refills | Status: DC | PRN

## 2022-05-28 MED ORDER — PROMETHAZINE HCL 25 MG PO TABS: 25.0000 mg | ORAL_TABLET | Freq: Three times a day (TID) | ORAL | 1 refills | Status: DC | PRN

## 2022-05-28 NOTE — H&P (Signed)
History and Physical    Janice Arnold OQH:[id number] DOB: July 12, 1981 DOA: 05/28/2022  PCP: Tawnya Crook, MD  Patient coming from: Home  Chief Complaint: Vomiting  HPI: Janice Arnold is a 41 y.o. female with medical history significant of hypertension, anxiety/depression, GERD, alcohol abuse, iron deficiency anemia, appendectomy in July 2023.  Seen at PCPs office today for nausea and vomiting, noted to be tachycardic and sent to the ED for further evaluation.  Afebrile.  Labs showing no leukocytosis, hemoglobin 12.2 (improved compared to prior labs), potassium 2.1, magnesium 1.3, chloride 91, creatinine 0.7 (baseline 0.3-0.5), AST 82, T. bili 3.8, ALT and alk phos normal, lipase normal, UA pending, beta-hCG negative. Patient was given Haldol, Reglan, 3 L LR boluses, IV magnesium 2 g, and IV potassium 10 mEq x 4.  [facility] called to admit.  Patient reports 2-year history of episodes of vomiting which happen every few months.  For the past 1 week she is vomiting again and has not been able to tolerate p.o. intake.  Patient states she has not consumed any alcoholic beverages since June.  She is also reporting ongoing heavy vaginal bleeding for the past 8 weeks associated with mild bilateral lower quadrant abdominal discomfort.  States she has been seen by a gynecologist for this problem and prescribed a medication and is scheduled for hysterectomy in December.  Denies fevers, cough, shortness of breath, or chest pain.  Review of Systems:  Review of Systems  All other systems reviewed and are negative.   Past Medical History:  Diagnosis Date   Alcohol abuse    Anemia    History of laparoscopic appendectomy 03/02/2022   Vitamin deficiency     Past Surgical History:  Procedure Laterality Date   APPENDECTOMY  03/02/2022   LAPAROSCOPIC APPENDECTOMY N/A 03/02/2022   Procedure: APPENDECTOMY LAPAROSCOPIC;  Surgeon: Donnie Mesa, MD;  Location: [facility];  Service: General;  Laterality: N/A;      reports that she has never smoked. She has never used smokeless tobacco. She reports that she does not currently use alcohol after a past usage of about 21.0 standard drinks of alcohol per week. She reports that she does not use drugs.  No Known Allergies  Family History  Problem Relation Age of Onset   Breast cancer Mother    Cervical cancer Mother        ovarian   Bone cancer Mother    Breast cancer Maternal Grandmother    Colon cancer Maternal Great-grandmother 65   Esophageal cancer Neg Hx    Stomach cancer Neg Hx     Prior to Admission medications   Medication Sig Start Date End Date Taking? Authorizing Provider  acetaminophen (TYLENOL) 500 MG tablet Take 1,000 mg by mouth 2 (two) times daily as needed (pain).   Yes [provider]  buPROPion (WELLBUTRIN XL) 150 MG 24 hr tablet Take 1 tablet (150 mg total) by mouth daily. 05/09/22  Yes Tawnya Crook, MD  Cyanocobalamin (VITAMIN B-12 IJ) Inject as directed once a week.   Yes [provider]  escitalopram (LEXAPRO) 20 MG tablet Take 1 tablet (20 mg total) by mouth daily. 03/04/22  Yes Tawnya Crook, MD  hydrochlorothiazide (HYDRODIURIL) 25 MG tablet Take 1 tablet (25 mg total) by mouth daily. 02/06/22  Yes Tawnya Crook, MD  hydrOXYzine (ATARAX) 10 MG tablet Take 1 tablet (10 mg total) by mouth 3 (three) times daily as needed for itching. 04/11/22  Yes Tawnya Crook, MD  melatonin 1 MG TABS tablet Take 1 mg by mouth at bedtime as needed (sleep).   Yes [provider]  norethindrone (AYGESTIN) 5 MG tablet Take 15 mg by mouth daily. 05/02/22  Yes [provider]  omeprazole (PRILOSEC) 40 MG capsule Take 1 capsule (40 mg total) by mouth daily. Patient taking differently: Take 40 mg by mouth daily as needed (heartburn). 04/11/22  Yes Tawnya Crook, MD  ondansetron (ZOFRAN-ODT) 4 MG disintegrating tablet Take 1 tablet (4 mg total) by mouth every 8 (eight) hours as needed for nausea or  vomiting. 05/28/22  Yes Tawnya Crook, MD  phentermine 37.5 MG capsule Take 1 capsule (37.5 mg total) by mouth every morning. 05/09/22  Yes Tawnya Crook, MD  potassium chloride SA (KLOR-CON M20) 20 MEQ tablet Take 2 tablets (40 mEq total) by mouth 2 (two) times daily. 05/28/22  Yes Tawnya Crook, MD  PRESCRIPTION MEDICATION Inject into the vein once a week. Iron infusion   Yes [provider]  Vitamin D, Ergocalciferol, (DRISDOL) 1.25 MG (50000 UNIT) CAPS capsule TAKE 1 CAPSULE (50,000 UNITS) BY MOUTH EVERY 7 DAYS 05/21/22  Yes Tawnya Crook, MD  promethazine (PHENERGAN) 25 MG tablet Take 1 tablet (25 mg total) by mouth every 8 (eight) hours as needed for nausea or vomiting. Don't take w/zofran 05/28/22   Tawnya Crook, MD    Physical Exam: Vitals:   05/28/22 1812 05/28/22 2130 05/28/22 2200  BP: 129/89 127/81 (!) 129/92  Pulse: (!) 134 (!) 109 (!) 108  Resp: '18 14 15  ' Temp: 98.4 F (36.9 C)  98.3 F (36.8 C)  TempSrc: Oral  Oral  SpO2: 97% 100% 100%    Physical Exam Vitals reviewed.  Constitutional:      General: She is not in acute distress. HENT:     Head: Normocephalic and atraumatic.  Eyes:     Extraocular Movements: Extraocular movements intact.  Cardiovascular:     Rate and Rhythm: Regular rhythm. Tachycardia present.     Pulses: Normal pulses.  Pulmonary:     Effort: Pulmonary effort is normal. No respiratory distress.     Breath sounds: Normal breath sounds. No wheezing or rales.  Abdominal:     General: Bowel sounds are normal. There is no distension.     Palpations: Abdomen is soft.     Tenderness: There is no abdominal tenderness. There is no guarding or rebound.  Musculoskeletal:        General: No swelling or tenderness.     Cervical back: Normal range of motion.  Skin:    General: Skin is warm and dry.  Neurological:     General: No focal deficit present.     Mental Status: She is alert and oriented to person, place, and time.      Labs on Admission: I have personally reviewed following labs and imaging studies  CBC: Recent Labs  Lab 05/28/22 1932  WBC 6.6  HGB 12.2  HCT 34.7*  MCV 105.8*  PLT 161   Basic Metabolic Panel: Recent Labs  Lab 05/28/22 1932  NA 135  K 2.1*  CL 91*  CO2 25  GLUCOSE 103*  BUN <5*  CREATININE 0.71  CALCIUM 8.8*  MG 1.3*   GFR: Estimated Creatinine Clearance: 101.2 mL/min (by C-G formula based on SCr of 0.71 mg/dL). Liver Function Tests: Recent Labs  Lab 05/28/22 1932  AST 82*  ALT 27  ALKPHOS 57  BILITOT 3.8*  PROT 8.4*  ALBUMIN  3.7   Recent Labs  Lab 05/28/22 1932  LIPASE 49   No results for input(s): "AMMONIA" in the last 168 hours. Coagulation Profile: Recent Labs  Lab 05/28/22 1932  INR 1.1   Cardiac Enzymes: No results for input(s): "CKTOTAL", "CKMB", "CKMBINDEX", "TROPONINI" in the last 168 hours. BNP (last 3 results) No results for input(s): "PROBNP" in the last 8760 hours. HbA1C: No results for input(s): "HGBA1C" in the last 72 hours. CBG: No results for input(s): "GLUCAP" in the last 168 hours. Lipid Profile: No results for input(s): "CHOL", "HDL", "LDLCALC", "TRIG", "CHOLHDL", "LDLDIRECT" in the last 72 hours. Thyroid Function Tests: No results for input(s): "TSH", "T4TOTAL", "FREET4", "T3FREE", "THYROIDAB" in the last 72 hours. Anemia Panel: No results for input(s): "VITAMINB12", "FOLATE", "FERRITIN", "TIBC", "IRON", "RETICCTPCT" in the last 72 hours. Urine analysis:    Component Value Date/Time   COLORURINE AMBER (A) 03/02/2022 1322   APPEARANCEUR CLOUDY (A) 03/02/2022 1322   LABSPEC 1.025 03/02/2022 1322   PHURINE 5.0 03/02/2022 1322   GLUCOSEU NEGATIVE 03/02/2022 1322   HGBUR NEGATIVE 03/02/2022 1322   BILIRUBINUR neg 03/04/2022 1017   KETONESUR NEGATIVE 03/02/2022 1322   PROTEINUR Negative 03/04/2022 1017   PROTEINUR NEGATIVE 03/02/2022 1322   UROBILINOGEN 0.2 03/04/2022 1017   NITRITE neg 03/04/2022 1017   NITRITE  NEGATIVE 03/02/2022 1322   LEUKOCYTESUR Trace (A) 03/04/2022 1017   LEUKOCYTESUR MODERATE (A) 03/02/2022 1322    Radiological Exams on Admission: No results found.  EKG: Independently reviewed.  Sinus tachycardia, baseline wander in V1, QTc 501.  Assessment and Plan  Intractable nausea and vomiting No fever or leukocytosis.  Abdominal exam benign. AST 82, T. bili 3.8, ALT and alk phos normal, lipase normal, beta-hCG negative. EGD/stomach biopsy done 03/27/2022 showing evidence of reactive gastropathy and mild chronic gastritis.  Prior history of alcohol abuse and stopped drinking 4 months ago. -IV Protonix 40 mg every 12 hours -IV fluid hydration -IM Tigan prn nausea/vomiting.  Avoid QT prolonging antiemetics. -UA pending -SARS-CoV-2 PCR -UDS  Severe hypokalemia and hypomagnesemia QT prolongation In the setting of vomiting. -Cardiac monitoring -Aggressive electrolyte replacement and monitor very closely -Avoid QT prolonging drugs -Repeat EKG in a.m.  AKI Likely prerenal from dehydration.  Creatinine 0.7, baseline 0.3-0.5. -IV fluid hydration -Monitor renal function -Avoid nephrotoxic agents/hold home hydrochlorothiazide  Hypochloremia In the setting of vomiting. -IV fluid hydration, continue to monitor  Vaginal bleeding Patient is followed by gynecology and has hysterectomy scheduled in December.  Hemoglobin improved compared to prior labs. -Monitor H&H -Outpatient gynecology follow-up  Hypertension Stable. -Hold hydrochlorothiazide given AKI  GERD -IV PPI  Anxiety/depression -Continue home meds when able to tolerate oral intake  DVT prophylaxis: SCDs Code Status: Full Code Family Communication: Husband at bedside. Level of care: Telemetry bed Admission status: It is my clinical opinion that referral for OBSERVATION is reasonable and necessary in this patient based on the above information provided. The aforementioned taken together are felt to place the  patient at high risk for further clinical deterioration. However, it is anticipated that the patient may be medically stable for discharge from the hospital within 24 to 48 hours.   Shela Leff MD [facility]  If 7PM-7AM, please contact night-coverage [email or web address]  05/28/2022, 11:50 PM

## 2022-05-28 NOTE — Patient Instructions (Signed)
Go to the ER  Try to get lexapro in you.

## 2022-05-28 NOTE — ED Provider Triage Note (Signed)
Emergency Medicine Provider Triage Evaluation Note  Janice Arnold , a 41 y.o. female  was evaluated in triage.  Pt complains of NV ongoing epsodically for 2 years but states that 6 days ago she began vomiting. She has not had similar for 4 months. Hx of etoh use but last drink was in June.   No rec drug use.   No fevers. No CP or SOB.   Feels like her heart is racing.   Also has had heavy vaginal bleeding for 8 weeks. States 2 super absorbant tampons per hour for 8 weeks... states blood has been pouring down her legs at times. OBGYN has given "a medicine" to stop this - on for 2 weeks.   Review of Systems  Positive: Vaginal bleeding, NV, lower abd pain Negative: Fever   Physical Exam  BP 129/89 (BP Location: Left Arm)   Pulse (!) 134   Temp 98.4 F (36.9 C) (Oral)   Resp 18   LMP 05/09/2022 (Exact Date) Comment: has been on for last 8 weeks  SpO2 97%  Gen:   Awake, no distress   Resp:  Normal effort  MSK:   Moves extremities without difficulty Other:  Abd soft NTTP. Somewhat pale. Tachycardic.   Medical Decision Making  Medically screening exam initiated at 7:14 PM.  Appropriate orders placed.  MERTIS MOSHER was informed that the remainder of the evaluation will be completed by another provider, this initial triage assessment does not replace that evaluation, and the importance of remaining in the ED until their evaluation is complete.  Labs, will recommend pt be placed in room.    Pati Gallo [place], [place] 05/28/22 [phone]

## 2022-05-28 NOTE — H&P (Incomplete)
History and Physical    Janice Arnold MWN:[id number] DOB: 19-Jan-1981 DOA: 05/28/2022  PCP: Tawnya Crook, MD  Patient coming from: Home  Chief Complaint: Vomiting  HPI: Janice Arnold is a 41 y.o. female with medical history significant of hypertension, anxiety/depression, GERD, alcohol abuse, iron deficiency anemia, appendectomy in July 2023.  Seen at PCPs office today for nausea and vomiting, noted to be tachycardic and sent to the ED for further evaluation.  Afebrile.  Labs showing no leukocytosis, hemoglobin 12.2 (improved compared to prior labs), potassium 2.1, magnesium 1.3, chloride 91, creatinine 0.7 (baseline 0.3-0.5), AST 82, T. bili 3.8, ALT and alk phos normal, lipase normal, UA pending, beta-hCG negative.  Patient was given Haldol, Reglan, 3 L LR boluses, IV magnesium 2 g, and IV potassium 10 mEq x 4.  ED Course: ***  Review of Systems:  ROS  Past Medical History:  Diagnosis Date  . Alcohol abuse   . Anemia   . History of laparoscopic appendectomy 03/02/2022  . Vitamin deficiency     Past Surgical History:  Procedure Laterality Date  . APPENDECTOMY  03/02/2022  . LAPAROSCOPIC APPENDECTOMY N/A 03/02/2022   Procedure: APPENDECTOMY LAPAROSCOPIC;  Surgeon: Donnie Mesa, MD;  Location: [facility];  Service: General;  Laterality: N/A;     reports that she has never smoked. She has never used smokeless tobacco. She reports that she does not currently use alcohol after a past usage of about 21.0 standard drinks of alcohol per week. She reports that she does not use drugs.  No Known Allergies  Family History  Problem Relation Age of Onset  . Breast cancer Mother   . Cervical cancer Mother        ovarian  . Bone cancer Mother   . Breast cancer Maternal Grandmother   . Colon cancer Maternal Great-grandmother 42  . Esophageal cancer Neg Hx   . Stomach cancer Neg Hx     Prior to Admission medications   Medication Sig Start Date End Date Taking? Authorizing  Provider  acetaminophen (TYLENOL) 500 MG tablet Take 1,000 mg by mouth 2 (two) times daily as needed (pain).   Yes [provider]  buPROPion (WELLBUTRIN XL) 150 MG 24 hr tablet Take 1 tablet (150 mg total) by mouth daily. 05/09/22  Yes Tawnya Crook, MD  Cyanocobalamin (VITAMIN B-12 IJ) Inject as directed once a week.   Yes [provider]  escitalopram (LEXAPRO) 20 MG tablet Take 1 tablet (20 mg total) by mouth daily. 03/04/22  Yes Tawnya Crook, MD  hydrochlorothiazide (HYDRODIURIL) 25 MG tablet Take 1 tablet (25 mg total) by mouth daily. 02/06/22  Yes Tawnya Crook, MD  hydrOXYzine (ATARAX) 10 MG tablet Take 1 tablet (10 mg total) by mouth 3 (three) times daily as needed for itching. 04/11/22  Yes Tawnya Crook, MD  melatonin 1 MG TABS tablet Take 1 mg by mouth at bedtime as needed (sleep).   Yes [provider]  norethindrone (AYGESTIN) 5 MG tablet Take 15 mg by mouth daily. 05/02/22  Yes [provider]  omeprazole (PRILOSEC) 40 MG capsule Take 1 capsule (40 mg total) by mouth daily. Patient taking differently: Take 40 mg by mouth daily as needed (heartburn). 04/11/22  Yes Tawnya Crook, MD  ondansetron (ZOFRAN-ODT) 4 MG disintegrating tablet Take 1 tablet (4 mg total) by mouth every 8 (eight) hours as needed for nausea or vomiting. 05/28/22  Yes Tawnya Crook, MD  phentermine 37.5 MG capsule Take  1 capsule (37.5 mg total) by mouth every morning. 05/09/22  Yes Tawnya Crook, MD  potassium chloride SA (KLOR-CON M20) 20 MEQ tablet Take 2 tablets (40 mEq total) by mouth 2 (two) times daily. 05/28/22  Yes Tawnya Crook, MD  PRESCRIPTION MEDICATION Inject into the vein once a week. Iron infusion   Yes [provider]  Vitamin D, Ergocalciferol, (DRISDOL) 1.25 MG (50000 UNIT) CAPS capsule TAKE 1 CAPSULE (50,000 UNITS) BY MOUTH EVERY 7 DAYS 05/21/22  Yes Tawnya Crook, MD  promethazine (PHENERGAN) 25 MG tablet Take 1 tablet (25 mg total)  by mouth every 8 (eight) hours as needed for nausea or vomiting. Don't take w/zofran 05/28/22   Tawnya Crook, MD    Physical Exam: Vitals:   05/28/22 1812 05/28/22 2130 05/28/22 2200  BP: 129/89 127/81 (!) 129/92  Pulse: (!) 134 (!) 109 (!) 108  Resp: '18 14 15  ' Temp: 98.4 F (36.9 C)  98.3 F (36.8 C)  TempSrc: Oral  Oral  SpO2: 97% 100% 100%    Physical Exam  Labs on Admission: I have personally reviewed following labs and imaging studies  CBC: Recent Labs  Lab 05/28/22 1932  WBC 6.6  HGB 12.2  HCT 34.7*  MCV 105.8*  PLT 878   Basic Metabolic Panel: Recent Labs  Lab 05/28/22 1932  NA 135  K 2.1*  CL 91*  CO2 25  GLUCOSE 103*  BUN <5*  CREATININE 0.71  CALCIUM 8.8*  MG 1.3*   GFR: Estimated Creatinine Clearance: 101.2 mL/min (by C-G formula based on SCr of 0.71 mg/dL). Liver Function Tests: Recent Labs  Lab 05/28/22 1932  AST 82*  ALT 27  ALKPHOS 57  BILITOT 3.8*  PROT 8.4*  ALBUMIN 3.7   Recent Labs  Lab 05/28/22 1932  LIPASE 49   No results for input(s): "AMMONIA" in the last 168 hours. Coagulation Profile: Recent Labs  Lab 05/28/22 1932  INR 1.1   Cardiac Enzymes: No results for input(s): "CKTOTAL", "CKMB", "CKMBINDEX", "TROPONINI" in the last 168 hours. BNP (last 3 results) No results for input(s): "PROBNP" in the last 8760 hours. HbA1C: No results for input(s): "HGBA1C" in the last 72 hours. CBG: No results for input(s): "GLUCAP" in the last 168 hours. Lipid Profile: No results for input(s): "CHOL", "HDL", "LDLCALC", "TRIG", "CHOLHDL", "LDLDIRECT" in the last 72 hours. Thyroid Function Tests: No results for input(s): "TSH", "T4TOTAL", "FREET4", "T3FREE", "THYROIDAB" in the last 72 hours. Anemia Panel: No results for input(s): "VITAMINB12", "FOLATE", "FERRITIN", "TIBC", "IRON", "RETICCTPCT" in the last 72 hours. Urine analysis:    Component Value Date/Time   COLORURINE AMBER (A) 03/02/2022 1322   APPEARANCEUR CLOUDY (A)  03/02/2022 1322   LABSPEC 1.025 03/02/2022 1322   PHURINE 5.0 03/02/2022 1322   GLUCOSEU NEGATIVE 03/02/2022 1322   HGBUR NEGATIVE 03/02/2022 1322   BILIRUBINUR neg 03/04/2022 1017   KETONESUR NEGATIVE 03/02/2022 1322   PROTEINUR Negative 03/04/2022 1017   PROTEINUR NEGATIVE 03/02/2022 1322   UROBILINOGEN 0.2 03/04/2022 1017   NITRITE neg 03/04/2022 1017   NITRITE NEGATIVE 03/02/2022 1322   LEUKOCYTESUR Trace (A) 03/04/2022 1017   LEUKOCYTESUR MODERATE (A) 03/02/2022 1322    Radiological Exams on Admission: No results found.  EKG: Independently reviewed. ***  Assessment and Plan  EGD/stomach biopsy done 03/27/2022 showing evidence of mild chronic gastritis.  Vaginal bleeding Per PCPs note from today, patient is followed by gynecology and has hysterectomy scheduled 12/28.  DVT prophylaxis: {Blank single:19197::"Lovenox","SQ Heparin","IV heparin gtt","Xarelto","Eliquis","Coumadin","SCDs","***"} Code  Status: {Blank single:19197::"Full Code","DNR","DNR/DNI","Comfort Care","***"} Family Communication: ***  Consults called: ***  Level of care: {Blank single:19197::"Med-Surg","Telemetry bed","Progressive Care Unit","Step Down Unit"} Admission status: ***  Shela Leff MD [facility]  If 7PM-7AM, please contact night-coverage [email or web address]  05/28/2022, 11:50 PM

## 2022-05-28 NOTE — ED Provider Notes (Signed)
[facility] DEPT Provider Note   CSN: [id number] Arrival date & time: 05/28/22  1808     History Chief Complaint  Patient presents with   Emesis    HPI Janice Arnold is a 41 y.o. female presenting for intractable nausea and vomiting.  She is a 41 year old female with a history of cyclic vomiting syndrome.  She endorses is 3 days of substantial nausea vomiting and poor p.o. intake.  She has been using her Zofran around-the-clock without success.  She has a history of similar that was attributed to heavy alcohol use and patient has ceased alcohol use.  She denies any other drug use including marijuana utilization. She has had an extended history of similar requiring close outpatient follow-up with PCP and gastroenterology consultation in the outpatient setting.  Still nonspecific underlying etiology. Patient's recorded medical, surgical, social, medication list and allergies were reviewed in the Snapshot window as part of the initial history.   Review of Systems   Review of Systems  Constitutional:  Negative for chills and fever.  HENT:  Negative for ear pain and sore throat.   Eyes:  Negative for pain and visual disturbance.  Respiratory:  Negative for cough and shortness of breath.   Cardiovascular:  Negative for chest pain and palpitations.  Gastrointestinal:  Positive for nausea and vomiting. Negative for abdominal pain.  Genitourinary:  Negative for dysuria and hematuria.  Musculoskeletal:  Negative for arthralgias and back pain.  Skin:  Negative for color change and rash.  Neurological:  Negative for seizures and syncope.  All other systems reviewed and are negative.   Physical Exam Updated Vital Signs BP (!) 129/92   Pulse (!) 108   Temp 98.3 F (36.8 C) (Oral)   Resp 15   LMP 05/09/2022 (Exact Date) Comment: has been on for last 8 weeks  SpO2 100%  Physical Exam Vitals and nursing note reviewed.  Constitutional:      General: She is  not in acute distress.    Appearance: She is well-developed.  HENT:     Head: Normocephalic and atraumatic.  Eyes:     Conjunctiva/sclera: Conjunctivae normal.  Cardiovascular:     Rate and Rhythm: Normal rate and regular rhythm.     Heart sounds: No murmur heard. Pulmonary:     Effort: Pulmonary effort is normal. No respiratory distress.     Breath sounds: Normal breath sounds.  Abdominal:     General: There is no distension.     Palpations: Abdomen is soft.     Tenderness: There is no abdominal tenderness. There is no right CVA tenderness or left CVA tenderness.  Musculoskeletal:        General: No swelling or tenderness. Normal range of motion.     Cervical back: Neck supple.  Skin:    General: Skin is warm and dry.  Neurological:     General: No focal deficit present.     Mental Status: She is alert and oriented to person, place, and time. Mental status is at baseline.     Cranial Nerves: No cranial nerve deficit.      ED Course/ Medical Decision Making/ A&P    Procedures Procedures   Medications Ordered in ED Medications  potassium chloride 10 mEq in 100 mL IVPB (10 mEq Intravenous New Bag/Given 05/28/22 2317)  lactated ringers bolus 1,000 mL (1,000 mLs Intravenous New Bag/Given 05/28/22 2146)  lactated ringers bolus 1,000 mL (0 mLs Intravenous Stopped 05/28/22 2256)  magnesium sulfate IVPB  2 g 50 mL (0 g Intravenous Stopped 05/28/22 2259)  lactated ringers bolus 1,000 mL (1,000 mLs Intravenous New Bag/Given 05/28/22 2210)  metoCLOPramide (REGLAN) injection 10 mg (10 mg Intravenous Given 05/28/22 2209)  haloperidol lactate (HALDOL) injection 2 mg (2 mg Intravenous Given 05/28/22 2252)    Medical Decision Making:    Janice Arnold is a 41 y.o. female who presented to the ED today with nausea and vomitting detailed above.     Patient's presentation is complicated by their history of cyclic vomiting syndrome.  Patient placed on continuous vitals and telemetry  monitoring while in ED which was reviewed periodically.   Complete initial physical exam performed, notably the patient  was hemodynamically stable in no acute distress.      Reviewed and confirmed nursing documentation for past medical history, family history, social history.    Initial Assessment:   With the patient's presentation of nausea vomiting, most likely diagnosis is recurrence of her cyclic vomiting syndrome. Other diagnoses were considered including (but not limited to) small obstruction, appendicitis, cholecystitis. These are considered less likely due to history of present illness and physical exam findings.   This is most consistent with an acute life/limb threatening illness complicated by underlying chronic conditions.  Initial Plan:  Screening labs including CBC and Metabolic panel to evaluate for infectious or metabolic etiology of disease.  EKG to evaluate for cardiac pathology. Objective evaluation as below reviewed with plan for close reassessment  Initial Study Results:   Laboratory  Multifocal abnormalities appreciated-including hypokalemia, hypomagnesemia, anion gap without resulting acidosis  EKG EKG was reviewed independently. Rate, rhythm, axis, intervals all examined and without medically relevant abnormality. ST segments without concerns for elevations.    Final Assessment and Plan:   On repeat evaluation, patient has had multiple medications today without symptomatic improvement.  She has had 2 doses of Zofran, a dose of Reglan 10 mg IV, he is getting IV fluids and while her heart rate is improving, she still does not feel like she will be able to eat or drink anything.  Patient states that she feels no pain at this time but is worried about her ongoing nausea vomiting.  Given the extensive nature of her electrolyte abnormalities including potassium of 2.1, I believe patient is going to require more advanced resuscitation and monitoring of these lab levels.   Therefore I recommended admission to the hospital I discussed case with hospitalist who agreed with need for admission. Disposition:   Based on the above findings, I believe this patient is stable for admission.    Patient/family educated about specific findings on our evaluation and explained exact reasons for admission.  Patient/family educated about clinical situation and time was allowed to answer questions.   Admission team communicated with and agreed with need for admission. Patient admitted. Patient  ready to move at this time.     Emergency Department Medication Summary:   Medications  potassium chloride 10 mEq in 100 mL IVPB (10 mEq Intravenous New Bag/Given 05/28/22 2317)  lactated ringers bolus 1,000 mL (1,000 mLs Intravenous New Bag/Given 05/28/22 2146)  lactated ringers bolus 1,000 mL (0 mLs Intravenous Stopped 05/28/22 2256)  magnesium sulfate IVPB 2 g 50 mL (0 g Intravenous Stopped 05/28/22 2259)  lactated ringers bolus 1,000 mL (1,000 mLs Intravenous New Bag/Given 05/28/22 2210)  metoCLOPramide (REGLAN) injection 10 mg (10 mg Intravenous Given 05/28/22 2209)  haloperidol lactate (HALDOL) injection 2 mg (2 mg Intravenous Given 05/28/22 2252)  Clinical Impression:  1. Vomiting, unspecified vomiting type, unspecified whether nausea present      Admit   Final Clinical Impression(s) / ED Diagnoses Final diagnoses:  Vomiting, unspecified vomiting type, unspecified whether nausea present    Rx / DC Orders ED Discharge Orders     None         Tretha Sciara, MD 05/28/22 2329

## 2022-05-28 NOTE — Progress Notes (Deleted)
Initial neurology clinic note  RONIT CRANFIELD MRN: [id number] DOB: Dec 21, 1980  Referring provider: Tawnya Crook, MD  Primary care provider: Tawnya Crook, MD  Reason for consult:  Dizziness  Subjective:  This is Ms. MANAHIL VANZILE, a 41 y.o. ***-handed female with a medical history of adjustment disorder, cyclic vomiting, L39 deficiency, EtOH abuse*** who presents to neurology clinic with ***. The patient is accompanied by ***.  ***  MEDICATIONS:  Outpatient Encounter Medications as of 05/29/2022  Medication Sig   acetaminophen (TYLENOL) 500 MG tablet Take 1,000 mg by mouth 2 (two) times daily as needed (pain).   buPROPion (WELLBUTRIN XL) 150 MG 24 hr tablet Take 1 tablet (150 mg total) by mouth daily.   Cyanocobalamin (VITAMIN B-12 IJ) Inject as directed once a week.   escitalopram (LEXAPRO) 20 MG tablet Take 1 tablet (20 mg total) by mouth daily.   hydrochlorothiazide (HYDRODIURIL) 25 MG tablet Take 1 tablet (25 mg total) by mouth daily.   hydrOXYzine (ATARAX) 10 MG tablet Take 1 tablet (10 mg total) by mouth 3 (three) times daily as needed for itching.   ibuprofen (ADVIL) 200 MG tablet Take 400 mg by mouth See admin instructions. Take 400 mg by mouth 2-3 times a day as needed for pain   melatonin 1 MG TABS tablet Take 1 mg by mouth at bedtime as needed.   norethindrone (AYGESTIN) 5 MG tablet Take by mouth.   omeprazole (PRILOSEC) 40 MG capsule Take 1 capsule (40 mg total) by mouth daily.   ondansetron (ZOFRAN-ODT) 4 MG disintegrating tablet Take 1 tablet (4 mg total) by mouth every 8 (eight) hours as needed for nausea or vomiting.   OVER THE COUNTER MEDICATION Take 2,500 mcg by mouth daily. Vitamin B-12 2500 mcg   phentermine 37.5 MG capsule Take 1 capsule (37.5 mg total) by mouth every morning.   potassium chloride SA (KLOR-CON M20) 20 MEQ tablet Take 1 tablet (20 mEq total) by mouth 2 (two) times daily.   PRESCRIPTION MEDICATION Inject into the vein once a week. Iron  infusion   Vitamin D, Ergocalciferol, (DRISDOL) 1.25 MG (50000 UNIT) CAPS capsule TAKE 1 CAPSULE (50,000 UNITS) BY MOUTH EVERY 7 DAYS   Facility-Administered Encounter Medications as of 05/29/2022  Medication   0.9 %  sodium chloride infusion    PAST MEDICAL HISTORY: Past Medical History:  Diagnosis Date   Alcohol abuse    Anemia    History of laparoscopic appendectomy 03/02/2022   Vitamin deficiency     PAST SURGICAL HISTORY: Past Surgical History:  Procedure Laterality Date   APPENDECTOMY  03/02/2022   LAPAROSCOPIC APPENDECTOMY N/A 03/02/2022   Procedure: APPENDECTOMY LAPAROSCOPIC;  Surgeon: Donnie Mesa, MD;  Location: [facility] ORS;  Service: General;  Laterality: N/A;    ALLERGIES: No Known Allergies  FAMILY HISTORY: Family History  Problem Relation Age of Onset   Breast cancer Mother    Cervical cancer Mother        ovarian   Bone cancer Mother    Breast cancer Maternal Grandmother    Colon cancer Maternal Great-grandmother 33   Esophageal cancer Neg Hx    Stomach cancer Neg Hx     SOCIAL HISTORY: Social History   Tobacco Use   Smoking status: Never   Smokeless tobacco: Never  Vaping Use   Vaping Use: Never used  Substance Use Topics   Alcohol use: Not Currently    Alcohol/week: 21.0 standard drinks of alcohol    Types: 21 Glasses  of wine per week    Comment: 8 drinks daily - recently stopped   Drug use: Never   Social History   Social History Narrative   Project mgr    Objective:  Vital Signs:  LMP 05/09/2022 (Exact Date) Comment: has been on for last 6 weeks  ***  Labs and Imaging review: Internal labs: No results found for: "HGBA1C" Lab Results  Component Value Date   YIFOYDXA12 878 04/19/2022   Lab Results  Component Value Date   TSH 2.76 01/30/2022   No results found for: "ESRSEDRATE", "POCTSEDRATE" CBC significant for anemia (Hb 10.2) BMP significant for hypokalemia (3.0 -> recheck 4.2) and mildly elevated glucose Vit D  30.65 CMP (03/02/22): elevated AST (104) > ALT (47), Tbili mildly elevated to 1.3 ***  External labs: ***  Imaging: ***  Assessment/Plan:  ZEHRA RUCCI is a 41 y.o. female who presents for evaluation of ***. *** has a relevant medical history of ***. *** neurological examination is pertinent for ***. Available diagnostic data is significant for ***. This constellation of symptoms and objective data would most likely localize to ***. ***  PLAN: -Blood work: *** ***  -Return to clinic ***  The impression above as well as the plan as outlined below were extensively discussed with the patient (in the company of ***) who voiced understanding. All questions were answered to their satisfaction.  The patient was counseled on pertinent fall precautions per the printed material provided today, and as noted under the "Patient Instructions" section below.***  When available, results of the above investigations and possible further recommendations will be communicated to the patient via telephone/MyChart. Patient to call office if not contacted after expected testing turnaround time.   Total time spent reviewing records, interview, history/exam, documentation, and coordination of care on day of encounter:  *** min   Thank you for allowing me to participate in patient's care.  If I can answer any additional questions, I would be pleased to do so.  Kai Levins, MD   CC: Tawnya Crook, MD [place] [id number]  CC: Referring provider: Tawnya Crook, MD [address] [place],  [place] [address]

## 2022-05-28 NOTE — Progress Notes (Signed)
Subjective:     Patient ID: Janice Arnold, female    DOB: [birth year], 41 y.o.   MRN: [id number]  Chief Complaint  Patient presents with   Emesis    Has been vomiting a lot since Thursday, medication not working     Dizziness   Letter for School/Work    Discuss letter for work     HPI  Cyclic vomiting-has been vomiting since 05/23/22.  No etoh. Zofran not working-2 tabs q8hrs. Helps some but not last 8 hrs. Vomiting at least 4x/day.feeling really weak.  Not taking meds-K, etc.     Dizziness/weakness. -hasn't been taking meds d/t vomiting.  Needs new letter for work-they will fire her when comes back to work.   Vaginal bleeding.  Seeing gyn.  Hyst sch for 12/28.    Health Maintenance Due  Topic Date Due   MAMMOGRAM  04/29/2020    Past Medical History:  Diagnosis Date   Alcohol abuse    Anemia    History of laparoscopic appendectomy 03/02/2022   Vitamin deficiency     Past Surgical History:  Procedure Laterality Date   APPENDECTOMY  03/02/2022   LAPAROSCOPIC APPENDECTOMY N/A 03/02/2022   Procedure: APPENDECTOMY LAPAROSCOPIC;  Surgeon: Donnie Mesa, MD;  Location: [facility];  Service: General;  Laterality: N/A;    Outpatient Medications Prior to Visit  Medication Sig Dispense Refill   acetaminophen (TYLENOL) 500 MG tablet Take 1,000 mg by mouth 2 (two) times daily as needed (pain).     buPROPion (WELLBUTRIN XL) 150 MG 24 hr tablet Take 1 tablet (150 mg total) by mouth daily. 30 tablet 1   Cyanocobalamin (VITAMIN B-12 IJ) Inject as directed once a week.     escitalopram (LEXAPRO) 20 MG tablet Take 1 tablet (20 mg total) by mouth daily. 90 tablet 1   hydrochlorothiazide (HYDRODIURIL) 25 MG tablet Take 1 tablet (25 mg total) by mouth daily. 90 tablet 3   hydrOXYzine (ATARAX) 10 MG tablet Take 1 tablet (10 mg total) by mouth 3 (three) times daily as needed for itching. 45 tablet 3   ibuprofen (ADVIL) 200 MG tablet Take 400 mg by mouth See admin instructions. Take 400 mg  by mouth 2-3 times a day as needed for pain     melatonin 1 MG TABS tablet Take 1 mg by mouth at bedtime as needed.     norethindrone (AYGESTIN) 5 MG tablet Take by mouth.     omeprazole (PRILOSEC) 40 MG capsule Take 1 capsule (40 mg total) by mouth daily. 90 capsule 1   OVER THE COUNTER MEDICATION Take 2,500 mcg by mouth daily. Vitamin B-12 2500 mcg     phentermine 37.5 MG capsule Take 1 capsule (37.5 mg total) by mouth every morning. 30 capsule 0   PRESCRIPTION MEDICATION Inject into the vein once a week. Iron infusion     Vitamin D, Ergocalciferol, (DRISDOL) 1.25 MG (50000 UNIT) CAPS capsule TAKE 1 CAPSULE (50,000 UNITS) BY MOUTH EVERY 7 DAYS 12 capsule 0   ondansetron (ZOFRAN-ODT) 4 MG disintegrating tablet Take 1 tablet (4 mg total) by mouth every 8 (eight) hours as needed for nausea or vomiting. 60 tablet 3   potassium chloride SA (KLOR-CON M20) 20 MEQ tablet Take 1 tablet (20 mEq total) by mouth 2 (two) times daily. 180 tablet 1   Facility-Administered Medications Prior to Visit  Medication Dose Route Frequency Provider Last Rate Last Admin   0.9 %  sodium chloride infusion  500 mL Intravenous Once [place],  Gladstone Pih, MD        No Known Allergies ROS neg/noncontributory except as noted HPI/below      Objective:     BP 129/88   Pulse (!) 129   Temp 97.7 F (36.5 C) (Temporal)   Ht '5\' 7"'$  (1.702 m)   Wt 174 lb 8 oz (79.2 kg)   LMP 05/09/2022 (Exact Date) Comment: has been on for last 8 weeks  SpO2 99%   BMI 27.33 kg/m  Wt Readings from Last 3 Encounters:  05/28/22 174 lb 8 oz (79.2 kg)  05/09/22 182 lb 2 oz (82.6 kg)  04/19/22 184 lb (83.5 kg)    Physical Exam   Gen: [facility] NAD. tired HEENT: NCAT, conjunctiva not injected, sclera nonicteric NECK:  supple, no thyromegaly, no nodes, no carotid bruits CARDIAC: tachyRRR, S1S2+, MSK: no gross abnormalities.  NEURO: A&O x3.  CN II-XII intact.  PSYCH: normal mood. Good eye contact     Assessment & Plan:   Problem List  Items Addressed This Visit       Other   Iron deficiency anemia due to chronic blood loss   Other Visit Diagnoses     Weakness    -  Primary   Nausea and vomiting, unspecified vomiting type       Relevant Medications   ondansetron (ZOFRAN-ODT) 4 MG disintegrating tablet      Nausea/vomiting-has returned.  No ETOH.  D/t tachycardia, weakness-pt to go to ED  declined ambulance.  Prob needs IVF and labs as K low in past. Dizziness/weakness-chronic anemia-may be worse, elctrolyte imbalance from vomiting and not able to take meds.  May be partly d/t withdrawal from psych meds as well.  Go to ER.   IDA-vaginal blood loss-sch for hyst end of December.  Cont FE, B12 Work-can't return right now as relapse.  Off till 11/1 F/u as sch next wk  Meds ordered this encounter  Medications   ondansetron (ZOFRAN-ODT) 4 MG disintegrating tablet    Sig: Take 1 tablet (4 mg total) by mouth every 8 (eight) hours as needed for nausea or vomiting.    Dispense:  60 tablet    Refill:  3   potassium chloride SA (KLOR-CON M20) 20 MEQ tablet    Sig: Take 2 tablets (40 mEq total) by mouth 2 (two) times daily.    Dispense:  360 tablet    Refill:  1   promethazine (PHENERGAN) 25 MG tablet    Sig: Take 1 tablet (25 mg total) by mouth every 8 (eight) hours as needed for nausea or vomiting. Don't take w/zofran    Dispense:  20 tablet    Refill:  1    Wellington Hampshire, MD

## 2022-05-28 NOTE — ED Triage Notes (Signed)
Pt reports that she has been having n/v since Wed, called PCP and told come here, lower abd pain, pt reports that she is anemic and has had a period for the past 8 weeks with heavy vaginal bleeding and dizziness

## 2022-05-29 ENCOUNTER — Ambulatory Visit: Payer: Managed Care, Other (non HMO) | Admitting: Neurology

## 2022-05-29 ENCOUNTER — Telehealth: Payer: Self-pay | Admitting: Family Medicine

## 2022-05-29 ENCOUNTER — Observation Stay (HOSPITAL_COMMUNITY): Payer: Managed Care, Other (non HMO)

## 2022-05-29 DIAGNOSIS — N179 Acute kidney failure, unspecified: Secondary | ICD-10-CM

## 2022-05-29 DIAGNOSIS — E876 Hypokalemia: Secondary | ICD-10-CM | POA: Diagnosis present

## 2022-05-29 DIAGNOSIS — R112 Nausea with vomiting, unspecified: Secondary | ICD-10-CM | POA: Diagnosis not present

## 2022-05-29 DIAGNOSIS — R9431 Abnormal electrocardiogram [ECG] [EKG]: Secondary | ICD-10-CM | POA: Diagnosis present

## 2022-05-29 DIAGNOSIS — K219 Gastro-esophageal reflux disease without esophagitis: Secondary | ICD-10-CM | POA: Diagnosis present

## 2022-05-29 DIAGNOSIS — I10 Essential (primary) hypertension: Secondary | ICD-10-CM

## 2022-05-29 DIAGNOSIS — Z0279 Encounter for issue of other medical certificate: Secondary | ICD-10-CM

## 2022-05-29 DIAGNOSIS — E878 Other disorders of electrolyte and fluid balance, not elsewhere classified: Secondary | ICD-10-CM

## 2022-05-29 DIAGNOSIS — N939 Abnormal uterine and vaginal bleeding, unspecified: Secondary | ICD-10-CM

## 2022-05-29 DIAGNOSIS — R7989 Other specified abnormal findings of blood chemistry: Secondary | ICD-10-CM | POA: Diagnosis present

## 2022-05-29 LAB — RAPID URINE DRUG SCREEN, HOSP PERFORMED
Amphetamines: NOT DETECTED
Barbiturates: NOT DETECTED
Benzodiazepines: NOT DETECTED
Cocaine: NOT DETECTED
Opiates: NOT DETECTED
Tetrahydrocannabinol: NOT DETECTED

## 2022-05-29 LAB — CBC
HCT: 28.4 % — ABNORMAL LOW (ref 36.0–46.0)
Hemoglobin: 9.9 g/dL — ABNORMAL LOW (ref 12.0–15.0)
MCH: 38.2 pg — ABNORMAL HIGH (ref 26.0–34.0)
MCHC: 34.9 g/dL (ref 30.0–36.0)
MCV: 109.7 fL — ABNORMAL HIGH (ref 80.0–100.0)
Platelets: 110 10*3/uL — ABNORMAL LOW (ref 150–400)
RBC: 2.59 MIL/uL — ABNORMAL LOW (ref 3.87–5.11)
RDW: 22.5 % — ABNORMAL HIGH (ref 11.5–15.5)
WBC: 4.9 10*3/uL (ref 4.0–10.5)
nRBC: 0 % (ref 0.0–0.2)

## 2022-05-29 LAB — BASIC METABOLIC PANEL
Anion gap: 11 (ref 5–15)
BUN: <5 mg/dL — ABNORMAL LOW (ref 6–20)
CO2: 27 mmol/L (ref 22–32)
Calcium: 8.1 mg/dL — ABNORMAL LOW (ref 8.9–10.3)
Chloride: 99 mmol/L (ref 98–111)
Creatinine, Ser: 0.57 mg/dL (ref 0.44–1.00)
GFR, Estimated: >60 mL/min (ref >60)
Glucose, Bld: 115 mg/dL — ABNORMAL HIGH (ref 70–99)
Potassium: 3.1 mmol/L — ABNORMAL LOW (ref 3.5–5.1)
Sodium: 137 mmol/L (ref 135–145)

## 2022-05-29 LAB — SARS CORONAVIRUS 2 BY RT PCR: SARS Coronavirus 2 by RT PCR: NEGATIVE

## 2022-05-29 LAB — COMPREHENSIVE METABOLIC PANEL
ALT: 20 U/L (ref 0–44)
AST: 54 U/L — ABNORMAL HIGH (ref 15–41)
Albumin: 2.8 g/dL — ABNORMAL LOW (ref 3.5–5.0)
Alkaline Phosphatase: 42 U/L (ref 38–126)
Anion gap: 12 (ref 5–15)
BUN: <5 mg/dL — ABNORMAL LOW (ref 6–20)
CO2: 26 mmol/L (ref 22–32)
Calcium: 7.8 mg/dL — ABNORMAL LOW (ref 8.9–10.3)
Chloride: 96 mmol/L — ABNORMAL LOW (ref 98–111)
Creatinine, Ser: 0.59 mg/dL (ref 0.44–1.00)
GFR, Estimated: >60 mL/min (ref >60)
Glucose, Bld: 98 mg/dL (ref 70–99)
Potassium: 2.7 mmol/L — CL (ref 3.5–5.1)
Sodium: 134 mmol/L — ABNORMAL LOW (ref 135–145)
Total Bilirubin: 5.2 mg/dL — ABNORMAL HIGH (ref 0.3–1.2)
Total Protein: 6 g/dL — ABNORMAL LOW (ref 6.5–8.1)

## 2022-05-29 LAB — MAGNESIUM
Magnesium: 1.5 mg/dL — ABNORMAL LOW (ref 1.7–2.4)
Magnesium: 2.4 mg/dL (ref 1.7–2.4)

## 2022-05-29 LAB — ABO/RH: ABO/RH(D): A POS

## 2022-05-29 MED ORDER — MAGNESIUM SULFATE 2 GM/50ML IV SOLN
2.0000 g | Freq: Once | INTRAVENOUS | Status: AC
  Administered 2022-05-29: 2 g via INTRAVENOUS
  Filled 2022-05-29: qty 50

## 2022-05-29 MED ORDER — POTASSIUM CHLORIDE 10 MEQ/100ML IV SOLN
10.0000 meq | INTRAVENOUS | Status: AC
  Administered 2022-05-29 (×6): 10 meq via INTRAVENOUS
  Filled 2022-05-29 (×6): qty 100

## 2022-05-29 MED ORDER — MAGNESIUM SULFATE 2 GM/50ML IV SOLN
2.0000 g | Freq: Once | INTRAVENOUS | Status: DC
  Filled 2022-05-29: qty 50

## 2022-05-29 MED ORDER — SODIUM CHLORIDE 0.9 % IV SOLN: INTRAVENOUS | Status: AC

## 2022-05-29 MED ORDER — TRIMETHOBENZAMIDE HCL 100 MG/ML IM SOLN: 200.0000 mg | Freq: Four times a day (QID) | INTRAMUSCULAR | Status: DC | PRN

## 2022-05-29 MED ORDER — POTASSIUM CHLORIDE CRYS ER 20 MEQ PO TBCR
20.0000 meq | EXTENDED_RELEASE_TABLET | Freq: Three times a day (TID) | ORAL | Status: DC
  Administered 2022-05-29: 20 meq via ORAL
  Filled 2022-05-29: qty 1

## 2022-05-29 MED ORDER — ACETAMINOPHEN 325 MG PO TABS: 650.0000 mg | ORAL_TABLET | Freq: Four times a day (QID) | ORAL | Status: DC | PRN

## 2022-05-29 MED ORDER — PANTOPRAZOLE SODIUM 40 MG IV SOLR
40.0000 mg | Freq: Two times a day (BID) | INTRAVENOUS | Status: DC
  Administered 2022-05-29 (×2): 40 mg via INTRAVENOUS
  Filled 2022-05-29 (×2): qty 10

## 2022-05-29 MED ORDER — POTASSIUM CHLORIDE 20 MEQ PO PACK
40.0000 meq | PACK | Freq: Once | ORAL | Status: AC
  Administered 2022-05-29: 40 meq via ORAL
  Filled 2022-05-29: qty 2

## 2022-05-29 MED ORDER — ACETAMINOPHEN 650 MG RE SUPP: 650.0000 mg | Freq: Four times a day (QID) | RECTAL | Status: DC | PRN

## 2022-05-29 NOTE — ED Notes (Signed)
Ultrasound tech at the bedside.

## 2022-05-29 NOTE — ED Notes (Signed)
Pt ambulated to BR, steady gait noted, no assist needed. Pt ambulated back to bed, this RN placed pt back on cardiac monitoring device, NAD noted, observed even RR and unlabored, side rails up x2 for safety, plan of care ongoing, pt and husband expresses no needs or concerns at this time, call light within reach, no further concerns as of present.

## 2022-05-29 NOTE — ED Notes (Signed)
Pt ambulated to BR, steady gait observed, no assist needed, pt will attempt to provide a urine sample at this time

## 2022-05-29 NOTE — ED Notes (Signed)
Liquid diet given to patient. Patient up and ambulatory to the bathroom. JRPRN

## 2022-05-29 NOTE — Discharge Summary (Signed)
Physician Discharge Summary  Janice Arnold [id number] DOB: 05/16/1981 DOA: 05/28/2022  PCP: Tawnya Crook, MD  Admit date: 05/28/2022 Discharge date: 05/29/2022  Admitted From: Home via PCP office Disposition: Home  Recommendations for Outpatient Follow-up:  Follow up with PCP in 1-2 weeks Please obtain CMP/magnesium/phosphorus/CBC in one week  Home Health: N/A Equipment/Devices: N/A  Discharge Condition: Stable CODE STATUS: Full code Diet recommendation: Regular diet, plenty of hydration,  Discharge summary: 41 year old with history of hypertension on hydrochlorothiazide, anxiety depression, GERD, previous alcohol use and admissions anemia, recent appendectomy in July went to PCP office for nausea and vomiting for 1 week, she was found tachycardic and sent to the emergency room where she was found to have potassium of 2.1, magnesium 1.3 with clinical dehydration.  Patient was treated with IV fluids, aggressive electrolyte replacement, intravenous potassium and magnesium with clinical improvement.  She does have intermittent nausea and vomiting and it has been ongoing for the last 1 week.  She does have a history of hypokalemia and was recently asked to decrease the dose of hydrochlorothiazide and continue on potassium supplementation.  Patient was extremely weak on arrival.  Hypokalemia and hypomagnesemia, clinical dehydration due to nausea. Electrolytes aggressively replaced, clinically improved and symptoms improved. Potassium 3.1, will discharge home with potassium chloride 40 mill equivalent 2 times a day.  She will stop hydrochlorothiazide until levels stabilize and she may even need alternate blood pressure medications. Magnesium was 1.3 on presentation, given IV magnesium and it is 2.4 now.  Cause for her nausea vomiting unknown, she does have elevated bilirubin with near normal transaminases.  Does have history of alcoholism but previous bilirubin was normal.  Suspect  cholestasis, possibly secondary to dehydration, may have viral illness.  Discussed this with patient.  Result updated.  Since patient is now asymptomatic, she was able to eat and drink, she was able to eat regular diet without use of any additional nausea medications, she would like to go home and follow-up at PCP office.  She will need repeat liver function test in about 1 week to ensure stabilization. Right upper quadrant ultrasound showed fatty liver but without other acute findings.  She will use nausea medicine with caution given electrolyte deficiencies.  She will resume to use her SSRI and melatonin.    Discharge Diagnoses:  Principal Problem:   Intractable nausea and vomiting Active Problems:   Hypokalemia   Hypomagnesemia   QT prolongation   AKI (acute kidney injury) (HCC)   Hypochloremia   Vaginal bleeding   HTN (hypertension)   GERD (gastroesophageal reflux disease)   Abnormal LFTs (liver function tests)    Discharge Instructions  Discharge Instructions     Call MD for:  extreme fatigue   Complete by: As directed    Call MD for:  persistant dizziness or light-headedness   Complete by: As directed    Call MD for:  persistant nausea and vomiting   Complete by: As directed    Diet general   Complete by: As directed    Discharge instructions   Complete by: As directed    Continue to take potassium tablets 40 mEq two times a day until repeat Labs done  Do not take water pill until new labs   Increase activity slowly   Complete by: As directed       Allergies as of 05/29/2022   No Known Allergies      Medication List     STOP taking these medications  hydrochlorothiazide 25 MG tablet Commonly known as: HYDRODIURIL   promethazine 25 MG tablet Commonly known as: PHENERGAN       TAKE these medications    acetaminophen 500 MG tablet Commonly known as: TYLENOL Take 1,000 mg by mouth 2 (two) times daily as needed (pain).   buPROPion 150 MG 24 hr  tablet Commonly known as: Wellbutrin XL Take 1 tablet (150 mg total) by mouth daily.   escitalopram 20 MG tablet Commonly known as: LEXAPRO Take 1 tablet (20 mg total) by mouth daily.   hydrOXYzine 10 MG tablet Commonly known as: ATARAX Take 1 tablet (10 mg total) by mouth 3 (three) times daily as needed for itching.   melatonin 1 MG Tabs tablet Take 1 mg by mouth at bedtime as needed (sleep).   norethindrone 5 MG tablet Commonly known as: AYGESTIN Take 15 mg by mouth daily.   omeprazole 40 MG capsule Commonly known as: PRILOSEC Take 1 capsule (40 mg total) by mouth daily. What changed:  when to take this reasons to take this   ondansetron 4 MG disintegrating tablet Commonly known as: ZOFRAN-ODT Take 1 tablet (4 mg total) by mouth every 8 (eight) hours as needed for nausea or vomiting.   phentermine 37.5 MG capsule Take 1 capsule (37.5 mg total) by mouth every morning.   potassium chloride SA 20 MEQ tablet Commonly known as: Klor-Con M20 Take 2 tablets (40 mEq total) by mouth 2 (two) times daily.   PRESCRIPTION MEDICATION Inject into the vein once a week. Iron infusion   VITAMIN B-12 IJ Inject as directed once a week.   Vitamin D (Ergocalciferol) 1.25 MG (50000 UNIT) Caps capsule Commonly known as: DRISDOL TAKE 1 CAPSULE (50,000 UNITS) BY MOUTH EVERY 7 DAYS        Follow-up Information     Tawnya Crook, MD Follow up in 1 week(s).   Specialty: Family Medicine Why: please recheck CMP, magnesium and Phosporus in follow up Contact information: [place] [address] [phone]                No Known Allergies  Consultations: None   Procedures/Studies: US Abdomen Limited RUQ (LIVER/GB)  Result Date: 05/29/2022 CLINICAL DATA: Intractable nausea, vomiting EXAM: ULTRASOUND ABDOMEN LIMITED RIGHT UPPER QUADRANT COMPARISON:  CT 03/02/2022 FINDINGS: Gallbladder: No gallstones or wall thickening visualized. No sonographic  Murphy sign noted by sonographer. Common bile duct: Diameter: Normal caliber, 4 mm Liver: Insert fatty low portal vein is patent on color Doppler imaging with normal direction of blood flow towards the liver. Other: None. IMPRESSION: Fatty liver. No acute findings. Electronically Signed   By: Rolm Baptise M.D.   On: 05/29/2022 01:12   (Echo, Carotid, EGD, Colonoscopy, ERCP)    Subjective: Patient was seen and examined.  Husband was at the bedside.  She was given liquid diet and tolerated.  Later on she was given regular diet and she tolerated.  Did not need to use any nausea medications this morning. Patient feels better.  Walking around the hallway.  Would like to go home.   Discharge Exam: Vitals:   05/29/22 1300 05/29/22 1315  BP:    Pulse: 100 96  Resp: 20 19  Temp:    SpO2: 100% 98%   Vitals:   05/29/22 1230 05/29/22 1245 05/29/22 1300 05/29/22 1315  BP:      Pulse: (!) 112 (!) 101 100 96  Resp:   20 19  Temp:  TempSrc:      SpO2: 98% 100% 100% 98%    General: Pt is alert, awake, not in acute distress Walking around in the hallway.   Cardiovascular: RRR, S1/S2 +, no rubs, no gallops Respiratory: CTA bilaterally, no wheezing, no rhonchi Abdominal: Soft, NT, ND, bowel sounds + Extremities: no edema, no cyanosis    The results of significant diagnostics from this hospitalization (including imaging, microbiology, ancillary and laboratory) are listed below for reference.     Microbiology: Recent Results (from the past 240 hour(s))  SARS Coronavirus 2 by RT PCR (hospital order, performed in [facility] hospital lab) *cepheid single result test* Anterior Nasal Swab     Status: None   Collection Time: 05/29/22 12:11 AM   Specimen: Anterior Nasal Swab  Result Value Ref Range Status   SARS Coronavirus 2 by RT PCR NEGATIVE NEGATIVE Final    Comment: (NOTE) SARS-CoV-2 target nucleic acids are NOT DETECTED.  The SARS-CoV-2 RNA is generally detectable in upper and  lower respiratory specimens during the acute phase of infection. The lowest concentration of SARS-CoV-2 viral copies this assay can detect is 250 copies / mL. A negative result does not preclude SARS-CoV-2 infection and should not be used as the sole basis for treatment or other patient management decisions.  A negative result may occur with improper specimen collection / handling, submission of specimen other than nasopharyngeal swab, presence of viral mutation(s) within the areas targeted by this assay, and inadequate number of viral copies (<250 copies / mL). A negative result must be combined with clinical observations, patient history, and epidemiological information.  Fact Sheet for Patients:   https://www.patel.info/  Fact Sheet for Healthcare Providers: https://hall.com/  This test is not yet approved or  cleared by the [place] FDA and has been authorized for detection and/or diagnosis of SARS-CoV-2 by FDA under an Emergency Use Authorization (EUA).  This EUA will remain in effect (meaning this test can be used) for the duration of the COVID-19 declaration under Section 564(b)(1) of the Act, 21 U.S.C. section 360bbb-3(b)(1), unless the authorization is terminated or revoked sooner.  Performed at [facility], [place] [address] [place], [place] [address]      Labs: BNP (last 3 results) No results for input(s): "BNP" in the last 8760 hours. Basic Metabolic Panel: Recent Labs  Lab 05/28/22 1932 05/29/22 0520 05/29/22 1226  NA 135 134* 137  K 2.1* 2.7* 3.1*  CL 91* 96* 99  CO2 '25 26 27  '$ GLUCOSE 103* 98 115*  BUN <5* <5* <5*  CREATININE 0.71 0.59 0.57  CALCIUM 8.8* 7.8* 8.1*  MG 1.3* 1.5* 2.4   Liver Function Tests: Recent Labs  Lab 05/28/22 1932 05/29/22 0520  AST 82* 54*  ALT 27 20  ALKPHOS 57 42  BILITOT 3.8* 5.2*  PROT 8.4* 6.0*  ALBUMIN 3.7 2.8*   Recent Labs  Lab 05/28/22 1932   LIPASE 49   No results for input(s): "AMMONIA" in the last 168 hours. CBC: Recent Labs  Lab 05/28/22 1932 05/29/22 0520  WBC 6.6 4.9  HGB 12.2 9.9*  HCT 34.7* 28.4*  MCV 105.8* 109.7*  PLT 160 110*   Cardiac Enzymes: No results for input(s): "CKTOTAL", "CKMB", "CKMBINDEX", "TROPONINI" in the last 168 hours. BNP: Invalid input(s): "POCBNP" CBG: No results for input(s): "GLUCAP" in the last 168 hours. D-Dimer No results for input(s): "DDIMER" in the last 72 hours. Hgb A1c No results for input(s): "HGBA1C" in the last 72 hours. Lipid Profile  No results for input(s): "CHOL", "HDL", "LDLCALC", "TRIG", "CHOLHDL", "LDLDIRECT" in the last 72 hours. Thyroid function studies No results for input(s): "TSH", "T4TOTAL", "T3FREE", "THYROIDAB" in the last 72 hours.  Invalid input(s): "FREET3" Anemia work up No results for input(s): "VITAMINB12", "FOLATE", "FERRITIN", "TIBC", "IRON", "RETICCTPCT" in the last 72 hours. Urinalysis    Component Value Date/Time   COLORURINE AMBER (A) 03/02/2022 1322   APPEARANCEUR CLOUDY (A) 03/02/2022 1322   LABSPEC 1.025 03/02/2022 1322   PHURINE 5.0 03/02/2022 1322   GLUCOSEU NEGATIVE 03/02/2022 1322   HGBUR NEGATIVE 03/02/2022 1322   BILIRUBINUR neg 03/04/2022 1017   KETONESUR NEGATIVE 03/02/2022 1322   PROTEINUR Negative 03/04/2022 1017   PROTEINUR NEGATIVE 03/02/2022 1322   UROBILINOGEN 0.2 03/04/2022 1017   NITRITE neg 03/04/2022 1017   NITRITE NEGATIVE 03/02/2022 1322   LEUKOCYTESUR Trace (A) 03/04/2022 1017   LEUKOCYTESUR MODERATE (A) 03/02/2022 1322   Sepsis Labs Recent Labs  Lab 05/28/22 1932 05/29/22 0520  WBC 6.6 4.9   Microbiology Recent Results (from the past 240 hour(s))  SARS Coronavirus 2 by RT PCR (hospital order, performed in [place] hospital lab) *cepheid single result test* Anterior Nasal Swab     Status: None   Collection Time: 05/29/22 12:11 AM   Specimen: Anterior Nasal Swab  Result Value Ref Range Status    SARS Coronavirus 2 by RT PCR NEGATIVE NEGATIVE Final    Comment: (NOTE) SARS-CoV-2 target nucleic acids are NOT DETECTED.  The SARS-CoV-2 RNA is generally detectable in upper and lower respiratory specimens during the acute phase of infection. The lowest concentration of SARS-CoV-2 viral copies this assay can detect is 250 copies / mL. A negative result does not preclude SARS-CoV-2 infection and should not be used as the sole basis for treatment or other patient management decisions.  A negative result may occur with improper specimen collection / handling, submission of specimen other than nasopharyngeal swab, presence of viral mutation(s) within the areas targeted by this assay, and inadequate number of viral copies (<250 copies / mL). A negative result must be combined with clinical observations, patient history, and epidemiological information.  Fact Sheet for Patients:   https://www.patel.info/  Fact Sheet for Healthcare Providers: https://hall.com/  This test is not yet approved or  cleared by the [place] FDA and has been authorized for detection and/or diagnosis of SARS-CoV-2 by FDA under an Emergency Use Authorization (EUA).  This EUA will remain in effect (meaning this test can be used) for the duration of the COVID-19 declaration under Section 564(b)(1) of the Act, 21 U.S.C. section 360bbb-3(b)(1), unless the authorization is terminated or revoked sooner.  Performed at [facility], [place] [address], [place], [place] [address]      Time coordinating discharge:  32 minutes  SIGNED:   Barb Merino, MD  [facility] 05/29/2022, 2:52 PM

## 2022-05-29 NOTE — Discharge Instructions (Signed)
Call MD for: extreme fatigue; Call MD for: persistant dizziness or light-headedness; Call MD for: persistant nausea and vomiting; Discharge instructions; Increase activity slowly

## 2022-05-29 NOTE — Telephone Encounter (Signed)
..  Type of form received: Disability / Physician's Statement  Additional comments:   Received by: Adonis Brook  Form should be Faxed to: [phone]  Form should be mailed to:    Is patient requesting call for pickup:   Form placed:  In provider's box  Attach charge sheet. yes  Individual made aware of 3-5 business day turn around (Y/N)?

## 2022-05-29 NOTE — ED Notes (Signed)
Pt resting and watching TV, husband at the bedside, NAD noted, observed even RR and unlabored, side rails up x2 for safety, plan of care ongoing, pt expresses no needs or concerns at this time, call light within reach, no further concerns as of present.

## 2022-05-29 NOTE — ED Notes (Signed)
Meal tray given to see if patient tolerates ok, and if so, she will be discharged. JRPRN

## 2022-05-30 DIAGNOSIS — Z0279 Encounter for issue of other medical certificate: Secondary | ICD-10-CM

## 2022-05-30 LAB — HIV ANTIBODY (ROUTINE TESTING W REFLEX): HIV Screen 4th Generation wRfx: NONREACTIVE

## 2022-05-30 NOTE — Telephone Encounter (Signed)
Form completed and faxed to [facility].

## 2022-06-01 ENCOUNTER — Other Ambulatory Visit: Payer: Self-pay | Admitting: Family Medicine

## 2022-06-06 ENCOUNTER — Inpatient Hospital Stay (HOSPITAL_COMMUNITY): Payer: Managed Care, Other (non HMO)

## 2022-06-06 ENCOUNTER — Observation Stay (HOSPITAL_COMMUNITY): Payer: Managed Care, Other (non HMO)

## 2022-06-06 ENCOUNTER — Ambulatory Visit: Payer: Managed Care, Other (non HMO) | Admitting: Family Medicine

## 2022-06-06 ENCOUNTER — Encounter (HOSPITAL_COMMUNITY): Payer: Self-pay | Admitting: Internal Medicine

## 2022-06-06 ENCOUNTER — Emergency Department (HOSPITAL_COMMUNITY): Payer: Managed Care, Other (non HMO)

## 2022-06-06 ENCOUNTER — Inpatient Hospital Stay (HOSPITAL_COMMUNITY)
Admission: EM | Admit: 2022-06-06 | Discharge: 2022-06-09 | DRG: 896 | Disposition: A | Discharge status: Home / Self Care | Payer: Managed Care, Other (non HMO) | Attending: Internal Medicine | Admitting: Internal Medicine

## 2022-06-06 ENCOUNTER — Other Ambulatory Visit: Payer: Self-pay

## 2022-06-06 DIAGNOSIS — F10288 Alcohol dependence with other alcohol-induced disorder: Secondary | ICD-10-CM

## 2022-06-06 DIAGNOSIS — E569 Vitamin deficiency, unspecified: Secondary | ICD-10-CM | POA: Diagnosis present

## 2022-06-06 DIAGNOSIS — N920 Excessive and frequent menstruation with regular cycle: Secondary | ICD-10-CM | POA: Diagnosis present

## 2022-06-06 DIAGNOSIS — I10 Essential (primary) hypertension: Secondary | ICD-10-CM | POA: Diagnosis present

## 2022-06-06 DIAGNOSIS — I159 Secondary hypertension, unspecified: Secondary | ICD-10-CM | POA: Diagnosis not present

## 2022-06-06 DIAGNOSIS — K700 Alcoholic fatty liver: Secondary | ICD-10-CM | POA: Diagnosis present

## 2022-06-06 DIAGNOSIS — R630 Anorexia: Secondary | ICD-10-CM | POA: Diagnosis present

## 2022-06-06 DIAGNOSIS — R262 Difficulty in walking, not elsewhere classified: Secondary | ICD-10-CM | POA: Diagnosis present

## 2022-06-06 DIAGNOSIS — Z793 Long term (current) use of hormonal contraceptives: Secondary | ICD-10-CM

## 2022-06-06 DIAGNOSIS — D181 Lymphangioma, any site: Secondary | ICD-10-CM | POA: Diagnosis present

## 2022-06-06 DIAGNOSIS — Z79899 Other long term (current) drug therapy: Secondary | ICD-10-CM

## 2022-06-06 DIAGNOSIS — R569 Unspecified convulsions: Secondary | ICD-10-CM | POA: Diagnosis present

## 2022-06-06 DIAGNOSIS — E871 Hypo-osmolality and hyponatremia: Secondary | ICD-10-CM | POA: Diagnosis present

## 2022-06-06 DIAGNOSIS — Z9049 Acquired absence of other specified parts of digestive tract: Secondary | ICD-10-CM | POA: Diagnosis not present

## 2022-06-06 DIAGNOSIS — F32A Depression, unspecified: Secondary | ICD-10-CM

## 2022-06-06 DIAGNOSIS — H5704 Mydriasis: Secondary | ICD-10-CM | POA: Diagnosis present

## 2022-06-06 DIAGNOSIS — F10239 Alcohol dependence with withdrawal, unspecified: Secondary | ICD-10-CM | POA: Diagnosis present

## 2022-06-06 DIAGNOSIS — I6203 Nontraumatic chronic subdural hemorrhage: Secondary | ICD-10-CM | POA: Diagnosis present

## 2022-06-06 DIAGNOSIS — R7989 Other specified abnormal findings of blood chemistry: Secondary | ICD-10-CM | POA: Diagnosis present

## 2022-06-06 DIAGNOSIS — E860 Dehydration: Secondary | ICD-10-CM | POA: Diagnosis present

## 2022-06-06 DIAGNOSIS — F1020 Alcohol dependence, uncomplicated: Secondary | ICD-10-CM | POA: Diagnosis present

## 2022-06-06 DIAGNOSIS — F419 Anxiety disorder, unspecified: Secondary | ICD-10-CM | POA: Diagnosis present

## 2022-06-06 DIAGNOSIS — K219 Gastro-esophageal reflux disease without esophagitis: Secondary | ICD-10-CM | POA: Diagnosis present

## 2022-06-06 DIAGNOSIS — E876 Hypokalemia: Secondary | ICD-10-CM | POA: Diagnosis present

## 2022-06-06 DIAGNOSIS — Z6827 Body mass index (BMI) 27.0-27.9, adult: Secondary | ICD-10-CM | POA: Diagnosis not present

## 2022-06-06 DIAGNOSIS — F1029 Alcohol dependence with unspecified alcohol-induced disorder: Secondary | ICD-10-CM | POA: Diagnosis not present

## 2022-06-06 DIAGNOSIS — Y901 Blood alcohol level of 20-39 mg/100 ml: Secondary | ICD-10-CM | POA: Diagnosis present

## 2022-06-06 DIAGNOSIS — S065XAA Traumatic subdural hemorrhage with loss of consciousness status unknown, initial encounter: Secondary | ICD-10-CM

## 2022-06-06 HISTORY — DX: Unspecified convulsions: R56.9

## 2022-06-06 HISTORY — DX: Alcoholic fatty liver: K70.0

## 2022-06-06 HISTORY — DX: Anxiety disorder, unspecified: F41.9

## 2022-06-06 HISTORY — DX: Excessive and frequent menstruation with regular cycle: N92.0

## 2022-06-06 HISTORY — DX: Traumatic subdural hemorrhage with loss of consciousness status unknown, initial encounter: S06.5XAA

## 2022-06-06 LAB — COMPREHENSIVE METABOLIC PANEL
ALT: 24 U/L (ref 0–44)
AST: 70 U/L — ABNORMAL HIGH (ref 15–41)
Albumin: 2.8 g/dL — ABNORMAL LOW (ref 3.5–5.0)
Alkaline Phosphatase: 54 U/L (ref 38–126)
Anion gap: 16 — ABNORMAL HIGH (ref 5–15)
BUN: <5 mg/dL — ABNORMAL LOW (ref 6–20)
CO2: 20 mmol/L — ABNORMAL LOW (ref 22–32)
Calcium: 8.8 mg/dL — ABNORMAL LOW (ref 8.9–10.3)
Chloride: 101 mmol/L (ref 98–111)
Creatinine, Ser: 0.64 mg/dL (ref 0.44–1.00)
GFR, Estimated: >60 mL/min (ref >60)
Glucose, Bld: 76 mg/dL (ref 70–99)
Potassium: 3.2 mmol/L — ABNORMAL LOW (ref 3.5–5.1)
Sodium: 137 mmol/L (ref 135–145)
Total Bilirubin: 1.8 mg/dL — ABNORMAL HIGH (ref 0.3–1.2)
Total Protein: 6.8 g/dL (ref 6.5–8.1)

## 2022-06-06 LAB — URINALYSIS, ROUTINE W REFLEX MICROSCOPIC
Bilirubin Urine: NEGATIVE
Glucose, UA: NEGATIVE mg/dL
Hgb urine dipstick: NEGATIVE
Ketones, ur: NEGATIVE mg/dL
Nitrite: NEGATIVE
Protein, ur: NEGATIVE mg/dL
Specific Gravity, Urine: 1.014 (ref 1.005–1.030)
pH: 8 (ref 5.0–8.0)

## 2022-06-06 LAB — I-STAT BETA HCG BLOOD, ED (MC, WL, AP ONLY): I-stat hCG, quantitative: <5 m[IU]/mL (ref <5)

## 2022-06-06 LAB — CBC WITH DIFFERENTIAL/PLATELET
Abs Immature Granulocytes: 0.02 10*3/uL (ref 0.00–0.07)
Basophils Absolute: 0.1 10*3/uL (ref 0.0–0.1)
Basophils Relative: 2 %
Eosinophils Absolute: 0.1 10*3/uL (ref 0.0–0.5)
Eosinophils Relative: 1 %
HCT: 34.4 % — ABNORMAL LOW (ref 36.0–46.0)
Hemoglobin: 11.7 g/dL — ABNORMAL LOW (ref 12.0–15.0)
Immature Granulocytes: 0 %
Lymphocytes Relative: 34 %
Lymphs Abs: 1.8 10*3/uL (ref 0.7–4.0)
MCH: 39.8 pg — ABNORMAL HIGH (ref 26.0–34.0)
MCHC: 34 g/dL (ref 30.0–36.0)
MCV: 117 fL — ABNORMAL HIGH (ref 80.0–100.0)
Monocytes Absolute: 0.4 10*3/uL (ref 0.1–1.0)
Monocytes Relative: 8 %
Neutro Abs: 2.9 10*3/uL (ref 1.7–7.7)
Neutrophils Relative %: 55 %
Platelets: 227 10*3/uL (ref 150–400)
RBC: 2.94 MIL/uL — ABNORMAL LOW (ref 3.87–5.11)
RDW: 19.8 % — ABNORMAL HIGH (ref 11.5–15.5)
WBC: 5.3 10*3/uL (ref 4.0–10.5)
nRBC: 0 % (ref 0.0–0.2)

## 2022-06-06 LAB — I-STAT CHEM 8, ED
BUN: <3 mg/dL — ABNORMAL LOW (ref 6–20)
Calcium, Ion: 0.98 mmol/L — ABNORMAL LOW (ref 1.15–1.40)
Chloride: 101 mmol/L (ref 98–111)
Creatinine, Ser: 0.5 mg/dL (ref 0.44–1.00)
Glucose, Bld: 76 mg/dL (ref 70–99)
HCT: 39 % (ref 36.0–46.0)
Hemoglobin: 13.3 g/dL (ref 12.0–15.0)
Potassium: 3.2 mmol/L — ABNORMAL LOW (ref 3.5–5.1)
Sodium: 136 mmol/L (ref 135–145)
TCO2: 19 mmol/L — ABNORMAL LOW (ref 22–32)

## 2022-06-06 LAB — MAGNESIUM: Magnesium: 1.4 mg/dL — ABNORMAL LOW (ref 1.7–2.4)

## 2022-06-06 LAB — RAPID URINE DRUG SCREEN, HOSP PERFORMED
Amphetamines: NOT DETECTED
Barbiturates: NOT DETECTED
Benzodiazepines: NOT DETECTED
Cocaine: NOT DETECTED
Opiates: NOT DETECTED
Tetrahydrocannabinol: NOT DETECTED

## 2022-06-06 LAB — ETHANOL: Alcohol, Ethyl (B): 38 mg/dL — ABNORMAL HIGH (ref <10)

## 2022-06-06 MED ORDER — FOLIC ACID 1 MG PO TABS
1.0000 mg | ORAL_TABLET | Freq: Every day | ORAL | Status: DC
  Administered 2022-06-06 – 2022-06-09 (×4): 1 mg via ORAL
  Filled 2022-06-06 (×4): qty 1

## 2022-06-06 MED ORDER — POTASSIUM CHLORIDE 10 MEQ/100ML IV SOLN
10.0000 meq | INTRAVENOUS | Status: AC
  Administered 2022-06-06 (×2): 10 meq via INTRAVENOUS
  Filled 2022-06-06 (×2): qty 100

## 2022-06-06 MED ORDER — LORAZEPAM 2 MG/ML IJ SOLN: 4.0000 mg | INTRAMUSCULAR | Status: DC | PRN

## 2022-06-06 MED ORDER — LORAZEPAM 2 MG/ML IJ SOLN: 2.0000 mg | Freq: Once | INTRAMUSCULAR | Status: AC

## 2022-06-06 MED ORDER — POTASSIUM CHLORIDE 10 MEQ/100ML IV SOLN
10.0000 meq | INTRAVENOUS | Status: DC
  Administered 2022-06-06: 10 meq via INTRAVENOUS
  Filled 2022-06-06: qty 100

## 2022-06-06 MED ORDER — LORAZEPAM 2 MG/ML IJ SOLN
INTRAMUSCULAR | Status: AC
  Administered 2022-06-06: 2 mg via INTRAVENOUS
  Filled 2022-06-06: qty 1

## 2022-06-06 MED ORDER — LEVETIRACETAM 500 MG PO TABS
500.0000 mg | ORAL_TABLET | Freq: Two times a day (BID) | ORAL | Status: DC
  Administered 2022-06-07 – 2022-06-09 (×6): 500 mg via ORAL
  Filled 2022-06-06 (×6): qty 1

## 2022-06-06 MED ORDER — LACTATED RINGERS IV SOLN: INTRAVENOUS | Status: DC

## 2022-06-06 MED ORDER — DEXAMETHASONE SODIUM PHOSPHATE 4 MG/ML IJ SOLN
4.0000 mg | Freq: Four times a day (QID) | INTRAMUSCULAR | Status: DC
  Administered 2022-06-06 – 2022-06-09 (×12): 4 mg via INTRAVENOUS
  Filled 2022-06-06 (×12): qty 1

## 2022-06-06 MED ORDER — LORAZEPAM 1 MG PO TABS
1.0000 mg | ORAL_TABLET | ORAL | Status: DC | PRN
  Administered 2022-06-07: 1 mg via ORAL
  Administered 2022-06-07: 3 mg via ORAL
  Administered 2022-06-07 – 2022-06-08 (×3): 1 mg via ORAL
  Filled 2022-06-06: qty 3
  Filled 2022-06-06 (×4): qty 1

## 2022-06-06 MED ORDER — SODIUM CHLORIDE 0.9 % IV SOLN
4500.0000 mg | Freq: Once | INTRAVENOUS | Status: AC
  Administered 2022-06-06: 4500 mg via INTRAVENOUS
  Filled 2022-06-06: qty 45

## 2022-06-06 MED ORDER — SODIUM CHLORIDE 0.9% FLUSH
3.0000 mL | Freq: Two times a day (BID) | INTRAVENOUS | Status: DC
  Administered 2022-06-06 – 2022-06-08 (×5): 3 mL via INTRAVENOUS

## 2022-06-06 MED ORDER — ADULT MULTIVITAMIN W/MINERALS CH
1.0000 | ORAL_TABLET | Freq: Every day | ORAL | Status: DC
  Administered 2022-06-06 – 2022-06-09 (×4): 1 via ORAL
  Filled 2022-06-06 (×4): qty 1

## 2022-06-06 MED ORDER — POTASSIUM CHLORIDE CRYS ER 20 MEQ PO TBCR
40.0000 meq | EXTENDED_RELEASE_TABLET | Freq: Once | ORAL | Status: AC
  Administered 2022-06-06: 40 meq via ORAL
  Filled 2022-06-06: qty 2

## 2022-06-06 MED ORDER — ORAL CARE MOUTH RINSE
15.0000 mL | OROMUCOSAL | Status: DC
  Administered 2022-06-06 – 2022-06-09 (×13): 15 mL via OROMUCOSAL

## 2022-06-06 MED ORDER — GADOBUTROL 1 MMOL/ML IV SOLN
8.0000 mL | Freq: Once | INTRAVENOUS | Status: AC | PRN
  Administered 2022-06-06: 8 mL via INTRAVENOUS

## 2022-06-06 MED ORDER — METOCLOPRAMIDE HCL 5 MG/ML IJ SOLN: 10.0000 mg | Freq: Four times a day (QID) | INTRAMUSCULAR | Status: DC | PRN

## 2022-06-06 MED ORDER — LACTATED RINGERS IV BOLUS
1000.0000 mL | Freq: Once | INTRAVENOUS | Status: AC
  Administered 2022-06-06: 1000 mL via INTRAVENOUS

## 2022-06-06 MED ORDER — ACETAMINOPHEN 650 MG RE SUPP: 650.0000 mg | RECTAL | Status: DC | PRN

## 2022-06-06 MED ORDER — MAGNESIUM SULFATE 2 GM/50ML IV SOLN
2.0000 g | Freq: Once | INTRAVENOUS | Status: AC
  Administered 2022-06-06: 2 g via INTRAVENOUS
  Filled 2022-06-06: qty 50

## 2022-06-06 MED ORDER — NORETHINDRONE ACETATE 5 MG PO TABS
10.0000 mg | ORAL_TABLET | Freq: Every day | ORAL | Status: DC
  Administered 2022-06-06 – 2022-06-09 (×4): 10 mg via ORAL
  Filled 2022-06-06 (×4): qty 2

## 2022-06-06 MED ORDER — LEVETIRACETAM IN NACL 500 MG/100ML IV SOLN
500.0000 mg | Freq: Two times a day (BID) | INTRAVENOUS | Status: DC
  Filled 2022-06-06: qty 100

## 2022-06-06 MED ORDER — HYDRALAZINE HCL 20 MG/ML IJ SOLN: 5.0000 mg | INTRAMUSCULAR | Status: DC | PRN

## 2022-06-06 MED ORDER — THIAMINE HCL 100 MG/ML IJ SOLN
100.0000 mg | Freq: Every day | INTRAMUSCULAR | Status: DC
  Administered 2022-06-07: 100 mg via INTRAVENOUS
  Filled 2022-06-06: qty 2

## 2022-06-06 MED ORDER — THIAMINE MONONITRATE 100 MG PO TABS
100.0000 mg | ORAL_TABLET | Freq: Every day | ORAL | Status: DC
  Administered 2022-06-06 – 2022-06-09 (×3): 100 mg via ORAL
  Filled 2022-06-06 (×3): qty 1

## 2022-06-06 MED ORDER — ACETAMINOPHEN 325 MG PO TABS
650.0000 mg | ORAL_TABLET | ORAL | Status: DC | PRN
  Administered 2022-06-07 – 2022-06-08 (×3): 650 mg via ORAL
  Filled 2022-06-06 (×4): qty 2

## 2022-06-06 MED ORDER — LEVETIRACETAM IN NACL 1000 MG/100ML IV SOLN
1000.0000 mg | Freq: Once | INTRAVENOUS | Status: DC
  Filled 2022-06-06: qty 100

## 2022-06-06 MED ORDER — LORAZEPAM 2 MG/ML IJ SOLN
1.0000 mg | INTRAMUSCULAR | Status: DC | PRN
  Administered 2022-06-06 – 2022-06-07 (×3): 2 mg via INTRAVENOUS
  Filled 2022-06-06 (×3): qty 1

## 2022-06-06 MED ORDER — CHLORHEXIDINE GLUCONATE 0.12% ORAL RINSE (MEDLINE KIT)
15.0000 mL | Freq: Two times a day (BID) | OROMUCOSAL | Status: DC
  Administered 2022-06-07 – 2022-06-09 (×3): 15 mL via OROMUCOSAL

## 2022-06-06 NOTE — Progress Notes (Signed)
EEG complete - results pending 

## 2022-06-06 NOTE — Consult Note (Signed)
   Providing Compassionate, Quality Care - Together  Neurosurgery Consult  Referring physician: ED MD Reason for referral: Subdural hematoma/hygroma  Chief Complaint: Seizure-like activity  History of Present Illness: This is a 41 year old female with a history of anxiety depression, alcohol abuse, excessive menstrual bleeding, with complaints of having a seizure this morning witnessed by her husband.  She was brought to the emergency department by EMS, witnessed to have a tonic-clonic seizure again in the emergency department.  This was resolved with Ativan.  At this time she denies headache, numbness, tingling or weakness.  She denies any recent trauma.   Medications: I have reviewed the patient's current medications. Allergies: No Known Allergies  History reviewed. No pertinent family history. Social History:  has no history on file for tobacco use, alcohol use, and drug use.  ROS: All pertinent positives negatives are listed HPI above  Physical Exam:  Vital signs in last 24 hours: Temp:  [98 F (36.7 C)-98.3 F (36.8 C)] 98 F (36.7 C) (07/25 1814) Pulse Rate:  [58-128] 65 (07/26 0746) Resp:  [11-18] 14 (07/26 0217) BP: (138-182)/(65-125) 153/88 (07/26 0700) SpO2:  [91 %-98 %] 96 % (07/26 0746) PE: Awake alert Orient x3, seems moderately anxious Tremulous in the bilateral upper extremities Speech fluent and appropriate PERRLA. 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  This could be the etiology of her seizure however she also may have a history of alcohol abuse that is related versus electrolyte abnormality.  At this time she has no focal  neurologic deficit or drift, her CT scan only shows approximately 3 mm of shift. -no acute neurosurgical intervention recommended   Thank you for allowing me to participate in this patient's care.  Please do not hesitate to call with questions or concerns.   Elwin Sleight, [facility] Cell: [phone]

## 2022-06-06 NOTE — ED Notes (Signed)
Radiology tech was getting bedside XR when pt had a witnessed tonic seizure that lasted about 30 sec. Pt's arms and legs stiffened up, no loss of bowel or urine. EDP notified and '2mg'$  ativan administered per verbal order. Pt postictal at this time.

## 2022-06-06 NOTE — Procedures (Signed)
Patient Name: Janice Arnold  MRN: [id number]  Epilepsy Attending: Lora Havens  Referring Physician/Provider: Janine Ores, NP  Date: 06/06/2022 Duration: 22.52 mins  Patient history: 41 year old female with right subdural hematoma.  EEG to evaluate for seizure.  Level of alertness: Awake, asleep  AEDs during EEG study: None  Technical aspects: This EEG study was done with scalp electrodes positioned according to the 10-20 International system of electrode placement. Electrical activity was reviewed with band pass filter of 1-'70Hz'$ , sensitivity of 7 uV/mm, display speed of 20m/sec with a '60Hz'$  notched filter applied as appropriate. EEG data were recorded continuously and digitally stored.  Video monitoring was available and reviewed as appropriate.  Description: The posterior dominant rhythm consists of '9Hz'$  activity of moderate voltage (25-35 uV) seen predominantly in posterior head regions, symmetric and reactive to eye opening and eye closing. Sleep was characterized by vertex waves, sleep spindles (12 to 14 Hz), maximal frontocentral region.  There is an excessive amount of 15 to 18 Hz beta activity distributed symmetrically and diffusely. Hyperventilation and photic stimulation were not performed.   Of note,  study was technically difficult due to significant electrode and sweat artifact.  ABNORMALITY - Excessive beta, generalized  IMPRESSION: This technically difficult study is within normal limits. The excessive beta activity seen in the background is most likely due to the effect of benzodiazepine and is a benign EEG pattern. No seizures or epileptiform discharges were seen throughout the recording.  A normal interictal EEG does not exclude the diagnosis of epilepsy.   [name]
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
## 2022-06-06 NOTE — ED Notes (Signed)
Patient sitting up in bed, no s/s of distress, husband present in the room, will continue to monitor.
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
## 2022-06-06 NOTE — ED Provider Notes (Signed)
[facility] EMERGENCY DEPARTMENT Provider Note   CSN: [id number] Arrival date & time: 06/06/22  6433     History  Chief Complaint  Patient presents with   Altered Mental Status    Janice Arnold is a 41 y.o. female.  HPI 41 year old female with a history of iron deficiency anemia, alcohol abuse, hypertension, recent admission for hypokalemia and hypomagnesemia presents to the ER with concerns for seizure like activity.  History provided by patient and husband at bedside.  Husband states that he woke up with the patient jerking and kicking.  He states that her eyes rolled in the back of her head and she was shaking for about 2 to 3 minutes.  Afterwards she did not know who he was and slowly started coming to after about a minute or so.  He states the whole episode lasted about 5 minutes.  She has no history of seizures.  She has been hurting her otherwise normal state of health.  She denies any drug or recent alcohol use.  States her last alcoholic drink was back in June.  She does endorse a headache, but no blurry vision, no nausea no vomiting.  She denies any weakness or difficulty speaking.  Denies hitting her head.  Not on anticoagulation.    Home Medications Prior to Admission medications   Medication Sig Start Date End Date Taking? Authorizing Provider  acetaminophen (TYLENOL) 500 MG tablet Take 1,000 mg by mouth 2 (two) times daily as needed (pain).    [provider]  buPROPion (WELLBUTRIN XL) 150 MG 24 hr tablet TAKE 1 TABLET BY MOUTH EVERY DAY 06/01/22   Tawnya Crook, MD  Cyanocobalamin (VITAMIN B-12 IJ) Inject as directed once a week.    [provider]  escitalopram (LEXAPRO) 20 MG tablet Take 1 tablet (20 mg total) by mouth daily. 03/04/22   Tawnya Crook, MD  hydrOXYzine (ATARAX) 10 MG tablet Take 1 tablet (10 mg total) by mouth 3 (three) times daily as needed for itching. 04/11/22   Tawnya Crook, MD  melatonin 1 MG TABS tablet  Take 1 mg by mouth at bedtime as needed (sleep).    [provider]  norethindrone (AYGESTIN) 5 MG tablet Take 15 mg by mouth daily. 05/02/22   [provider]  omeprazole (PRILOSEC) 40 MG capsule Take 1 capsule (40 mg total) by mouth daily. Patient taking differently: Take 40 mg by mouth daily as needed (heartburn). 04/11/22   Tawnya Crook, MD  ondansetron (ZOFRAN-ODT) 4 MG disintegrating tablet Take 1 tablet (4 mg total) by mouth every 8 (eight) hours as needed for nausea or vomiting. 05/28/22   Tawnya Crook, MD  phentermine 37.5 MG capsule Take 1 capsule (37.5 mg total) by mouth every morning. 05/09/22   Tawnya Crook, MD  potassium chloride SA (KLOR-CON M20) 20 MEQ tablet Take 2 tablets (40 mEq total) by mouth 2 (two) times daily. 05/28/22   Tawnya Crook, MD  PRESCRIPTION MEDICATION Inject into the vein once a week. Iron infusion    [provider]  Vitamin D, Ergocalciferol, (DRISDOL) 1.25 MG (50000 UNIT) CAPS capsule TAKE 1 CAPSULE (50,000 UNITS) BY MOUTH EVERY 7 DAYS 05/21/22   Tawnya Crook, MD      Allergies    Patient has no known allergies.    Review of Systems   Review of Systems Ten systems reviewed and are negative for acute change, except as noted in the HPI.  Physical Exam Updated Vital Signs BP (!) 129/109   Pulse (!) 113   Temp 99.4 F (37.4 C) (Oral)   Resp (!) 35   Ht '5\' 7"'$  (1.702 m)   Wt 79.4 kg   LMP 05/09/2022 (Exact Date) Comment: has been on for last 8 weeks  SpO2 99%   BMI 27.41 kg/m  Physical Exam Vitals and nursing note reviewed.  Constitutional:      General: She is not in acute distress.    Appearance: She is well-developed.  HENT:     Head: Normocephalic and atraumatic.  Eyes:     Conjunctiva/sclera: Conjunctivae normal.     Comments: Pupils dilated, 4+, brisk, reactive   Cardiovascular:     Rate and Rhythm: Normal rate and regular rhythm.     Heart sounds: No murmur heard. Pulmonary:     Effort:  Pulmonary effort is normal. No respiratory distress.     Breath sounds: Normal breath sounds.  Abdominal:     Palpations: Abdomen is soft.     Tenderness: There is no abdominal tenderness.  Musculoskeletal:        General: No swelling.     Cervical back: Neck supple.  Skin:    General: Skin is warm and dry.     Capillary Refill: Capillary refill takes less than 2 seconds.  Neurological:     General: No focal deficit present.     Mental Status: She is alert and oriented to person, place, and time.     Sensory: No sensory deficit.     Motor: No weakness.     Comments: Mental Status:  Alert, thought content appropriate, able to give a coherent history. Speech fluent without evidence of aphasia. Able to follow 2 step commands without difficulty.  Cranial Nerves:  II:  Peripheral visual fields grossly normal, pupils equal, round, reactive to light III,IV, VI: ptosis not present, extra-ocular motions intact bilaterally  V,VII: smile symmetric, facial light touch sensation equal VIII: hearing grossly normal to voice  X: uvula elevates symmetrically  XI: bilateral shoulder shrug symmetric and strong XII: midline tongue extension without fassiculations Motor:  Normal tone. 5/5 strength of BUE and BLE major muscle groups including strong and equal grip strength and dorsiflexion/plantar flexion Sensory: light touch normal in all extremities. Cerebellar: normal finger-to-nose with bilateral upper extremities, Romberg sign absent Gait: not accessed  Mild tremor at rest noted in upper and lower extremities, improves with movement      Psychiatric:        Mood and Affect: Mood normal.     ED Results / Procedures / Treatments   Labs (all labs ordered are listed, but only abnormal results are displayed) Labs Reviewed  CBC WITH DIFFERENTIAL/PLATELET - Abnormal; Notable for the following components:      Result Value   RBC 2.94 (*)    Hemoglobin 11.7 (*)    HCT 34.4 (*)    MCV 117.0  (*)    MCH 39.8 (*)    RDW 19.8 (*)    All other components within normal limits  COMPREHENSIVE METABOLIC PANEL - Abnormal; Notable for the following components:   Potassium 3.2 (*)    CO2 20 (*)    BUN <5 (*)    Calcium 8.8 (*)    Albumin 2.8 (*)    AST 70 (*)    Total Bilirubin 1.8 (*)    Anion gap 16 (*)    All other components within normal limits  ETHANOL - Abnormal; Notable  for the following components:   Alcohol, Ethyl (B) 38 (*)    All other components within normal limits  MAGNESIUM - Abnormal; Notable for the following components:   Magnesium 1.4 (*)    All other components within normal limits  I-STAT CHEM 8, ED - Abnormal; Notable for the following components:   Potassium 3.2 (*)    BUN <3 (*)    Calcium, Ion 0.98 (*)    TCO2 19 (*)    All other components within normal limits  URINALYSIS, ROUTINE W REFLEX MICROSCOPIC  RAPID URINE DRUG SCREEN, HOSP PERFORMED  I-STAT BETA HCG BLOOD, ED ([facility], [facility], [facility] ONLY)    EKG None  Radiology CT Head Wo Contrast  Result Date: 06/06/2022 CLINICAL DATA:  Altered mental status.  Seizure. EXAM: CT HEAD WITHOUT CONTRAST TECHNIQUE: Contiguous axial images were obtained from the base of the skull through the vertex without intravenous contrast. RADIATION DOSE REDUCTION: This exam was performed according to the departmental dose-optimization program which includes automated exposure control, adjustment of the mA and/or kV according to patient size and/or use of iterative reconstruction technique. COMPARISON:  None Available. FINDINGS: Brain: Right cerebral convexity chronic subdural hematoma/hygroma measuring 1.3 cm in maximal thickness with mild mass effect on the cerebral sulci and 3 mm right-to-left midline shift. No evidence of hemorrhage, acute infarction, hydrocephalus, or mass lesion. Vascular: No hyperdense vessel or unexpected calcification. Skull: Normal. Negative for fracture or focal lesion. Sinuses/Orbits: No acute finding.  Other: None. IMPRESSION: 1. 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 No acute intracranial hemorrhage. Electronically Signed   By: Titus Dubin M.D.   On: 06/06/2022 08:33   DG Chest Portable 1 View  Result Date: 06/06/2022 CLINICAL DATA:  Altered mental status.  Seizure. EXAM: PORTABLE CHEST 1 VIEW COMPARISON:  None Available. FINDINGS: The heart size and mediastinal contours are within normal limits. Both lungs are clear. The visualized skeletal structures are unremarkable. IMPRESSION: No active disease. Electronically Signed   By: Titus Dubin M.D.   On: 06/06/2022 08:26    Procedures Procedures    Medications Ordered in ED Medications  potassium chloride 10 mEq in 100 mL IVPB (10 mEq Intravenous New Bag/Given 06/06/22 0950)  dexamethasone (DECADRON) injection 4 mg (has no administration in time range)  levETIRAcetam (KEPPRA) 4,500 mg in sodium chloride 0.9 % 250 mL IVPB (has no administration in time range)  LORazepam (ATIVAN) injection 2 mg (2 mg Intravenous Given 06/06/22 0654)  magnesium sulfate IVPB 2 g 50 mL (2 g Intravenous New Bag/Given 06/06/22 0907)  potassium chloride SA (KLOR-CON M) CR tablet 40 mEq (40 mEq Oral Given 06/06/22 0902)  lactated ringers bolus 1,000 mL (1,000 mLs Intravenous New Bag/Given 06/06/22 3557)    ED Course/ Medical Decision Making/ A&P                           Medical Decision Making Amount and/or Complexity of Data Reviewed Labs: ordered. Radiology: ordered.  Risk Prescription drug management.   INITIAL IMPRESSION / ASSESSMENT AND PLAN / ED COURSE   Pertinent labs & imaging results that were available during my care of the patient were reviewed by me and considered in my medical decision making (see chart for details).   This patient is Presenting for Evaluation of seizure activity, which does require a range of  treatment options, and is a complaint that  involves a high risk of morbidity and mortality.   The Differential Diagnoses includes but is not exclusive to new onset seizure, alcohol withdrawal seizure, ICH, hypomagnesemia, drug induced seizure, sepsis,       I did obtain Additional Historical Information from patient's husband at bedside    I decided to review pertinent External Data, and in summary  Patient has had 2 hospital admissions this year, 1 for appendicitis where she admitted to drinking 8 glasses of vodka daily in July of this year.  She was also admitted on the 17th for nausea and vomiting with electrolyte disturbances.   Clinical Laboratory Tests ordered, reviewed, and interpreted by me   CBC with hemoglobin 11.7, improved from prior CMP with a hypokalemia of 3.2, CO2 of 20, anion gap of 16, CBG 76, AST elevated at 70 which appears to be chronic. Pregnancy negative. Ethanol elevated at 38   Radiologic Tests Ordered, I independently interpreted the images and agree with radiology interpretation.   CT HEAD W/O CONTRAST   IMPRESSION:  1. Right cerebral convexity chronic subdural hematoma/hygroma  measuring 1.3 cm in maximal thickness with mild mass effect and 3 mm  right-to-left midline shift. No acute intracranial hemorrhage.   CXR unremarkable   Cardiac Monitor Tracing which shows sinus tachycardia   Social Determinants of Health Risk patient with a history of chronic alcohol abuse   Consult complete with   Dr. Reatha Armour with neurosurgery, he will come evaluate the patient, recommends 1 g IV Keppra load and initiation of steroids  Also spoke with Dr. Quinn Axe with neurology, who recommends increasing Keppra load to 16 mg/kg   Medical Decision Making: Summary:  41 year old female presenting with concerns for seizure-like activity.  On arrival, she is alert and oriented x4.  Her physical exam reveals 4+ dilated pupils, reactive with a tremor in both upper and lower  extremities.   Shortly after I initially examined her, I was called to the bedside with concerns for seizure-like activity.  Patient was drooling, eyes rolled back in the head, with arms and legs extended and internally rotated.  No significant tonic-clonic activity.  Episode lasted about 2 to 3 minutes, she was given 2 mg of Ativan with resolution.  No urinary incontinence.  Her lab work shows a hypomagnesemia of 1.4 and a hypokalemia 3.2, which she was given repletion for.  CT of the head with chronic subdural/hygroma with 3 mm midline shift.  Discussed with neurosurgery who recommended IV 1 g Keppra load with steroids.  I also discussed the patient's case with Dr. Quinn Axe with neurology, who recommends increasing her Keppra load, plan to obtain an MRI and EEG.   Reevaluation with update and discussion with patient, she is agreeable to admission  Considered admission, spoke with Dr. Lorin Mercy  Disposition: Admission   This was a shared visit with my supervising physician Dr. 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

## 2022-06-07 DIAGNOSIS — I6203 Nontraumatic chronic subdural hemorrhage: Secondary | ICD-10-CM | POA: Diagnosis not present

## 2022-06-07 DIAGNOSIS — F10288 Alcohol dependence with other alcohol-induced disorder: Secondary | ICD-10-CM | POA: Diagnosis not present

## 2022-06-07 DIAGNOSIS — R569 Unspecified convulsions: Secondary | ICD-10-CM | POA: Diagnosis not present

## 2022-06-07 LAB — CBC
HCT: 30.3 % — ABNORMAL LOW (ref 36.0–46.0)
Hemoglobin: 10.6 g/dL — ABNORMAL LOW (ref 12.0–15.0)
MCH: 40.8 pg — ABNORMAL HIGH (ref 26.0–34.0)
MCHC: 35 g/dL (ref 30.0–36.0)
MCV: 116.5 fL — ABNORMAL HIGH (ref 80.0–100.0)
Platelets: 213 10*3/uL (ref 150–400)
RBC: 2.6 MIL/uL — ABNORMAL LOW (ref 3.87–5.11)
RDW: 18.8 % — ABNORMAL HIGH (ref 11.5–15.5)
WBC: 4.8 10*3/uL (ref 4.0–10.5)
nRBC: 0 % (ref 0.0–0.2)

## 2022-06-07 LAB — BASIC METABOLIC PANEL
Anion gap: 10 (ref 5–15)
BUN: <5 mg/dL — ABNORMAL LOW (ref 6–20)
CO2: 24 mmol/L (ref 22–32)
Calcium: 9 mg/dL (ref 8.9–10.3)
Chloride: 103 mmol/L (ref 98–111)
Creatinine, Ser: 0.63 mg/dL (ref 0.44–1.00)
GFR, Estimated: >60 mL/min (ref >60)
Glucose, Bld: 131 mg/dL — ABNORMAL HIGH (ref 70–99)
Potassium: 4.2 mmol/L (ref 3.5–5.1)
Sodium: 137 mmol/L (ref 135–145)

## 2022-06-07 NOTE — TOC Transition Note (Signed)
Transition of Care [facility]) - CM/SW Discharge Note   Patient Details  Name: Janice Arnold MRN: [id number] Date of Birth: 06/19/1981  [facility] CM/SW Contact:  Pollie Friar, RN Phone Number: 06/07/2022, 1:44 PM   Clinical Narrative:    Pt is from home with her spouse that works during the daytime.  No DME at home.  Pt drives self and manages her own medications without any issues. TOC following for dc needs.      Barriers to Discharge: Continued Medical Work up   Patient Goals and CMS Choice        Discharge Placement                       Discharge Plan and Services                                     Social Determinants of Health (SDOH) Interventions     Readmission Risk Interventions     No data to display
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
## 2022-06-07 NOTE — Progress Notes (Signed)
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 No pathologic enhancement.  Confirms the hygroma with midline shift without any worsening. -She also will need driving restriction for at least 6 months -Seizure precautions -Ativan prn  -Hold bupropion, as this medication lowers the seizure threshold   ETOH dependence -Patient with chronic ETOH dependence -With concern that this was alcohol-withdrawal related, will need to monitor closely now and in the future for recurrent seizure activity -Will observe on progressive care at this time -CIWA protocol, patient intermittently needing Ativan -Folate, thiamine, and MVI ordered -Will provide symptom-triggered BZD (ativan per CIWA protocol) -TOC team consult for substance abuse education -Will also check UDS. -Elevated LFTs are likely related to alcoholism, monitor with daily lab -Consider offering a medication for Alcohol Use Disorder at the time of d/c, to include Disulfuram; Naltrexone; or Acamprosate.         Advance Care Planning:   Code Status: Full Code    Consults: Neurosurgery; Neurology   DVT Prophylaxis: SCDs   Family Communication:  Husband was present throughout evaluation        Subjective: Patient was seen and examined at bedside today.  Patient was sleeping easily arousable.  She had no new complaints.  In discussion with the nurse reported that patient still having some tremors but not scoring high all the time on CIWA to need Ativan.  However last night she did receive some Ativan.  No recurrent seizures.  Physical Exam: Vitals:   06/07/22 0500 06/07/22 0750 06/07/22 1000 06/07/22 1318  BP: 118/76 98/60 105/70 117/78  Pulse: 95 94 98 98  Resp:    18  Temp:  98.4 F (36.9 C)    TempSrc:  Oral    SpO2:  99%  99%  Weight:      Height:       General:  Appears calm and comfortable and is in NAD, mildly disheveled and older than stated age Eyes:  PERRL, EOMI, normal lids, iris ENT:  grossly normal hearing, lips & tongue (no tongue trauma), mmm Neck:  no LAD, masses or thyromegaly Cardiovascular:  RR with mild tachycardia, no m/r/g. No LE edema.  Respiratory:   CTA bilaterally with no wheezes/rales/rhonchi.  Normal respiratory effort. Abdomen:  soft, NT, ND Skin:  no rash or induration seen on limited exam Musculoskeletal:  grossly normal tone BUE/BLE, good ROM, no bony abnormality Psychiatric:  blunted mood and affect, speech fluent and appropriate, mildly jittery Neurologic:  CN 2-12 grossly intact, moves all extremities in coordinated fashion Data Reviewed:  There are no new results to review at this time.  Family Communication: Patient's husband present at bedside  Disposition: Status is: Inpatient Remains inpatient appropriate because: Secondary to patient having alcohol withdrawal symptoms  Planned Discharge Destination: Home with  Home Health    Time spent: 35 minutes  Author: Carlyle Lipa, MD 06/07/2022 2:13 PM  For on call review [email or web address].

## 2022-06-07 NOTE — TOC CAGE-AID Note (Addendum)
Transition of Care [facility]) - CAGE-AID Screening   Patient Details  Name: Janice Arnold MRN: [id number] Date of Birth: 11/03/1980  Transition of [facility]) CM/SW Contact:    Pollie Friar, RN Phone Number: 06/07/2022, 1:37 PM   Clinical Narrative: Pt refused inpatient/ outpatient alcohol counseling resources.   CAGE-AID Screening:    Have You Ever Felt You Ought to Cut Down on Your Drinking or Drug Use?: Yes Have People Annoyed You By Critizing Your Drinking Or Drug Use?: No Have You Felt Bad Or Guilty About Your Drinking Or Drug Use?: No Have You Ever Had a Drink or Used Drugs First Thing In The Morning to Steady Your Nerves or to Get Rid of a Hangover?: No CAGE-AID Score: 1  Substance Abuse Education Offered: Yes (refused)

## 2022-06-08 DIAGNOSIS — K219 Gastro-esophageal reflux disease without esophagitis: Secondary | ICD-10-CM

## 2022-06-08 DIAGNOSIS — R569 Unspecified convulsions: Secondary | ICD-10-CM | POA: Diagnosis not present

## 2022-06-08 DIAGNOSIS — F1029 Alcohol dependence with unspecified alcohol-induced disorder: Secondary | ICD-10-CM | POA: Diagnosis not present

## 2022-06-08 DIAGNOSIS — I159 Secondary hypertension, unspecified: Secondary | ICD-10-CM

## 2022-06-08 DIAGNOSIS — E876 Hypokalemia: Secondary | ICD-10-CM

## 2022-06-08 MED ORDER — PANTOPRAZOLE SODIUM 40 MG PO TBEC
40.0000 mg | DELAYED_RELEASE_TABLET | Freq: Every day | ORAL | Status: DC
  Administered 2022-06-08 – 2022-06-09 (×2): 40 mg via ORAL
  Filled 2022-06-08 (×2): qty 1

## 2022-06-08 NOTE — Assessment & Plan Note (Signed)
Continue antiacid therapy with pantoprazole.  ?

## 2022-06-08 NOTE — Progress Notes (Signed)
  Progress Note   Patient: Janice Arnold ION:[id number] DOB: 01-Jun-1981 DOA: 06/06/2022     2 DOS: the patient was seen and examined on 06/08/2022   Brief hospital course: Janice Arnold was admitted to the hospital with the working diagnosis of seizures.   41 yo female with the past medical history of alcohol abuse, who presented with seizure. Witnessed event tonic clonic movements for one minute with post event confusion. She had at least one other episode at home and one in the ED with decreased responsiveness. On her initial physical examination her blood pressure was 119/86, HR 109, RR 16 and 02 saturation 99%, lungs with no wheezing or rales, heart with S1 and S2 present and rhythmic with no gallops, abdomen soft and no lower extremity edema. Patient wake and alert, non focal.   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   Chest radiograph with no infiltrates, no effusions  EKG 108 bpm, normal axis, normal intervals, sinus rhythm with no significant ST segment or T wave changes.   Assessment and Plan: * Seizure [facility]) Patient has been on Keppra and steroid taper with good toleration. Follow up Brain MRI with hygroma.  She is tolerating po well, but continue to have difficulty ambulating No further seizures.    Alcohol dependence ([place]) Acute alcohol withdrawal.  Patient has been on alcohol withdrawal protocol. Continue with vitamins including thiamine.  Continue close follow up  Elevated liver enzymes due to acute liver intoxication.   Patient is tolerating po well, discontinue IV fluids.    GERD (gastroesophageal reflux disease) Continue antiacid therapy with  pantoprazole.   HTN (hypertension) Systolic blood pressure has been 130 mmHg Continue close blood pressure monitoring.  Continue to hold on antihypertensive medications   Hypokalemia Renal function with stable serum cr at 0,63, K is 4,2 and serum bicarbonate at 24 Patient is tolerating po well, discontinue IV fluids.         Subjective: Patient with no nausea or vomiting, continue to be shaky when ambulating,   Physical Exam: Vitals:   06/08/22 0745 06/08/22 0900 06/08/22 1202 06/08/22 1613  BP: 134/89 (!) 135/92 129/85 129/82  Pulse: 83 87 86 97  Resp: (!) '21  16 17  '$ Temp: 98.3 F (36.8 C)  98.7 F (37.1 C) 98.5 F (36.9 C)  TempSrc: Oral  Oral Oral  SpO2: 100%  99% 99%  Weight:      Height:       Neurology awake and alert ENT with no pallor Cardiovascular with S1 and S2 present and rhythmic with no gallops Respiratory with no rales or wheezing Abdomen with no distention  No lower extremity edema  Data Reviewed:   Family Communication: I spoke with patient's husband at the bedside, we talked in detail about patient's condition, plan of care and prognosis and all questions were addressed.   Disposition: Status is: Inpatient Remains inpatient appropriate because: alcohol withdrawal   Planned Discharge Destination: Home    Author: Tawni Millers, MD 06/08/2022 5:01 PM  For on call review [email or web address].

## 2022-06-08 NOTE — Hospital Course (Signed)
Janice Arnold was admitted to the hospital with the working diagnosis of seizures.   41 yo female with the past medical history of alcohol abuse, who presented with seizure. Witnessed event tonic clonic movements for one minute with post event confusion. She had at least one other episode at home and one in the ED with decreased responsiveness. On her initial physical examination her blood pressure was 119/86, HR 109, RR 16 and 02 saturation 99%, lungs with no wheezing or rales, heart with S1 and S2 present and rhythmic with no gallops, abdomen soft and no lower extremity edema. Patient wake and alert, non focal.   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   Chest radiograph with no infiltrates, no effusions  EKG 108 bpm, normal axis, normal intervals, sinus rhythm with no significant ST segment or T wave changes.   Patient was placed on alcohol withdrawal protocol, along with supportive medical therapy with good toleration. Neurology was consulted with recommendations to continue [place] for seizure prophylaxis.  Neurosurgery was consulted with recommended to continue with dexamethasone taper along with antiepileptic medical therapy.   10/29 patient with improvement in her symptoms, plan to follow up as outpatient with seizure precautions and continue with alcohol cessation.

## 2022-06-08 NOTE — Assessment & Plan Note (Signed)
Patient was admitted to the medical ward and had close neuro checks. EEG was negative for epileptiform discharges.   She was placed on Keppra and dexamethasone with good toleration.  No further seizures.  Progressive improvement in withdrawal symptoms, (tremors).  Patient had no agitation or encephalopathy.  Plan to continue with Keppra at discharge Taper dexamethasone every 3 days. Follow up with Neurology as outpatient.  Continue with seizure precautions.

## 2022-06-08 NOTE — Assessment & Plan Note (Addendum)
Acute alcohol withdrawal.  Patient was placed on alcohol withdrawal protocol. Continue with vitamins including thiamine.   Elevated liver enzymes due to acute liver intoxication.  No clinical signs of hepatitis.   At the time of her discharge patient is tolerating po well and has progressive improvement in her symptoms.  Advices to follow up with alcoholic anonymous

## 2022-06-08 NOTE — Assessment & Plan Note (Signed)
Renal function with stable serum cr at 0,63, K is 4,2 and serum bicarbonate at 24 Patient is tolerating po well, discontinue IV fluids.

## 2022-06-08 NOTE — Assessment & Plan Note (Signed)
Systolic blood pressure has been 130 mmHg Continue close blood pressure monitoring.  Continue to hold on antihypertensive medications

## 2022-06-09 DIAGNOSIS — I159 Secondary hypertension, unspecified: Secondary | ICD-10-CM | POA: Diagnosis not present

## 2022-06-09 DIAGNOSIS — K219 Gastro-esophageal reflux disease without esophagitis: Secondary | ICD-10-CM | POA: Diagnosis not present

## 2022-06-09 DIAGNOSIS — F1029 Alcohol dependence with unspecified alcohol-induced disorder: Secondary | ICD-10-CM | POA: Diagnosis not present

## 2022-06-09 DIAGNOSIS — F32A Depression, unspecified: Secondary | ICD-10-CM

## 2022-06-09 DIAGNOSIS — R569 Unspecified convulsions: Secondary | ICD-10-CM | POA: Diagnosis not present

## 2022-06-09 LAB — BASIC METABOLIC PANEL
Anion gap: 8 (ref 5–15)
BUN: 8 mg/dL (ref 6–20)
CO2: 23 mmol/L (ref 22–32)
Calcium: 9.3 mg/dL (ref 8.9–10.3)
Chloride: 103 mmol/L (ref 98–111)
Creatinine, Ser: 0.7 mg/dL (ref 0.44–1.00)
GFR, Estimated: >60 mL/min (ref >60)
Glucose, Bld: 132 mg/dL — ABNORMAL HIGH (ref 70–99)
Potassium: 4.2 mmol/L (ref 3.5–5.1)
Sodium: 134 mmol/L — ABNORMAL LOW (ref 135–145)

## 2022-06-09 LAB — MAGNESIUM: Magnesium: 1.8 mg/dL (ref 1.7–2.4)

## 2022-06-09 MED ORDER — DEXAMETHASONE 4 MG PO TABS: ORAL_TABLET | ORAL | 0 refills | Status: DC

## 2022-06-09 MED ORDER — DEXAMETHASONE 4 MG PO TABS: 4.0000 mg | ORAL_TABLET | Freq: Three times a day (TID) | ORAL | Status: DC

## 2022-06-09 MED ORDER — LEVETIRACETAM 500 MG PO TABS: 500.0000 mg | ORAL_TABLET | Freq: Two times a day (BID) | ORAL | 0 refills | Status: DC

## 2022-06-09 MED ORDER — LORAZEPAM 1 MG PO TABS: 1.0000 mg | ORAL_TABLET | Freq: Four times a day (QID) | ORAL | 0 refills | Status: DC | PRN

## 2022-06-09 MED ORDER — LORAZEPAM 1 MG PO TABS: 1.0000 mg | ORAL_TABLET | Freq: Four times a day (QID) | ORAL | Status: DC | PRN

## 2022-06-09 NOTE — Assessment & Plan Note (Signed)
Continue with bupropion and escitalopram. Hold on hydroxyzine for now while on as needed lorazepam to prevent oversedation.

## 2022-06-09 NOTE — TOC Transition Note (Signed)
Transition of Care [facility]) - CM/SW Discharge Note   Patient Details  Name: JANACE DECKER MRN: [id number] Date of Birth: 1980/11/19  Transition of Care [facility]) CM/SW Contact:  Carles Collet, RN Phone Number: 06/09/2022, 1:17 PM   Clinical Narrative:     Damaris Schooner w patient's spouse, agreeable to referral being placed to [facility] street for PT OT. Bedside commode delivered to room prior to DC. No other TOC needs identified    Barriers to Discharge: Continued Medical Work up   Patient Goals and CMS Choice        Discharge Placement                       Discharge Plan and Services                                     Social Determinants of Health (SDOH) Interventions     Readmission Risk Interventions     No data to display

## 2022-06-09 NOTE — Discharge Summary (Signed)
Physician Discharge Summary   Patient: Janice Arnold MRN: [id number] DOB: [birth year]  Admit date:     06/06/2022  Discharge date: 06/09/22  Discharge Physician: Jimmy Picket Janice Arnold   PCP: Tawnya Crook, MD   Recommendations at discharge:    Patient will continue with Keppra as outpatient and instructions to follow up with neurology. Continue with seizure precautions, including not driving for 6 months and follow up with Neurology. Added as needed lorazepam and advised to follow up with alcoholic anonymous.  Continue with dexamethasone taper.   Discharge Diagnoses: Principal Problem:   Seizure ([place]) Active Problems:   Alcohol dependence ([place])   GERD (gastroesophageal reflux disease)   HTN (hypertension)   Hypokalemia  Resolved Problems:   * No resolved hospital problems. [facility] Course: Janice Arnold was admitted to the hospital with the working diagnosis of seizures.   41 yo female with the past medical history of alcohol abuse, who presented with seizure. Witnessed event tonic clonic movements for one minute with post event confusion. She had at least one other episode at home and one in the ED with decreased responsiveness. On her initial physical examination her blood pressure was 119/86, HR 109, RR 16 and 02 saturation 99%, lungs with no wheezing or rales, heart with S1 and S2 present and rhythmic with no gallops, abdomen soft and no lower extremity edema. Patient wake and alert, non focal.   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   Chest radiograph with no infiltrates, no effusions  EKG 108 bpm, normal axis, normal intervals, sinus rhythm  with no significant ST segment or T wave changes.   Assessment and Plan: * Seizure [facility]) Patient was admitted to the medical ward and had close neuro checks She was placed on Keppra and dexamethasone with good toleration.  No further seizures.  Progressive improvement in withdrawal symptoms, tremors.  Patient had no agitation or encephalopathy.  Plan to continue with Keppra at discharge Taper dexamethasone every 3 days. Follow up with Neurology as outpatient.  Continue with seizure precautions.    Alcohol dependence ([place]) Acute alcohol withdrawal.  Patient was placed on alcohol withdrawal protocol. Continue with vitamins including thiamine.   Elevated liver enzymes due to acute liver intoxication.  No clinical signs of hepatitis.   At the time of her discharge patient is tolerating po well and has progressive improvement in her symptoms.  Advices to follow up with alcoholic anonymous    GERD (gastroesophageal reflux disease) Continue antiacid therapy with pantoprazole.   HTN (hypertension) Her blood pressure remained well controlled during her hospitalization with no antihypertensive medications Plan to follow up as outpatient.   Hypokalemia Hyponatremia  Patient was placed on IV fluids and supportive medical therapy, her electrolytes were corrected. At the time of her discharge her serum cr is 0,70, K is 4,2 and serum bicarbonate at 23. Na 134.   Plan to follow up electrolytes as outpatient.          Consultants: neurology and neurosurgery  Procedures performed: none   Disposition: Home Diet recommendation:  Regular diet DISCHARGE MEDICATION: Allergies as of 06/09/2022  No Known Allergies      Medication List     STOP taking these medications    hydrOXYzine 10 MG tablet Commonly known as: ATARAX   potassium chloride SA 20 MEQ tablet Commonly known as: Klor-Con M20   promethazine 25 MG tablet Commonly known as: PHENERGAN       TAKE these  medications    acetaminophen 500 MG tablet Commonly known as: TYLENOL Take 1,000 mg by mouth 2 (two) times daily as needed (pain).   buPROPion 150 MG 24 hr tablet Commonly known as: WELLBUTRIN XL TAKE 1 TABLET BY MOUTH EVERY DAY   dexamethasone 4 MG tablet Commonly known as: DECADRON Take one tablet 3 times daily for three days, then 2 times daily for three days, then 1 time daily for three days, then stop.   escitalopram 20 MG tablet Commonly known as: LEXAPRO Take 1 tablet (20 mg total) by mouth daily.   levETIRAcetam 500 MG tablet Commonly known as: KEPPRA Take 1 tablet (500 mg total) by mouth 2 (two) times daily.   LORazepam 1 MG tablet Commonly known as: ATIVAN Take 1 tablet (1 mg total) by mouth every 6 (six) hours as needed for anxiety.   Low-Ogestrel 0.3-30 MG-MCG tablet Generic drug: norgestrel-ethinyl estradiol Take 1 tablet by mouth daily.   melatonin 1 MG Tabs tablet Take 1 mg by mouth at bedtime as needed (sleep).   norethindrone 5 MG tablet Commonly known as: AYGESTIN Take 10 mg by mouth daily.   omeprazole 40 MG capsule Commonly known as: PRILOSEC Take 1 capsule (40 mg total) by mouth daily. What changed:  when to take this reasons to take this   ondansetron 4 MG disintegrating tablet Commonly known as: ZOFRAN-ODT Take 1 tablet (4 mg total) by mouth every 8 (eight) hours as needed for nausea or vomiting.   phentermine 37.5 MG capsule Take 1 capsule (37.5 mg total) by mouth every morning.   Vitamin D (Ergocalciferol) 1.25 MG (50000 UNIT) Caps capsule Commonly known as: DRISDOL TAKE 1 CAPSULE (50,000 UNITS) BY MOUTH EVERY 7 DAYS What changed: See the new instructions.        Discharge Exam: Filed Weights   06/06/22 0629  Weight: 79.4 kg   Patient is feeling better, with no nausea or vomiting, tremors with significant improvement,   BP 112/89 (BP Location: Left Arm)   Pulse 82   Temp 98.4 F (36.9 C) (Oral)   Resp 14   Ht '5\' 7"'$   (1.702 m)   Wt 79.4 kg   LMP 05/09/2022 (Exact Date) Comment: has been on for last 8 weeks  SpO2 99%   BMI 27.41 kg/m   Neurology awake and alert, no tremors, no confusion or agitation  ENT with no pallor Cardiovascular with S1 and S2 present and rhythmic Respiratory with no rales or wheezing Abdomen with no distention  No lower extremity edema   Condition at discharge: stable  The results of significant diagnostics from this hospitalization (including imaging, microbiology, ancillary and laboratory) are listed below for reference.   Imaging Studies: MR BRAIN W WO CONTRAST  Result Date: 06/06/2022 CLINICAL DATA:  Seizure, new-onset, no history of trauma EXAM: MRI HEAD WITHOUT AND WITH CONTRAST TECHNIQUE: Multiplanar, multiecho pulse sequences of the brain and surrounding structures were obtained without and with intravenous contrast. CONTRAST:  43m GADAVIST GADOBUTROL 1 MMOL/ML IV SOLN COMPARISON:  CT head from the same day. FINDINGS: Brain: When comparing across modalities to same day CT head, similar size of a  right cerebral convexity subdural hemorrhage. Similar leftward midline shift. No evidence of acute infarct, mass lesion, or hydrocephalus. No pathologic enhancement. Vascular: Major are arteries are grossly patent at the skull base on postcontrast imaging. Skull and upper cervical spine: Normal marrow signal. Sinuses/Orbits: No acute findings. IMPRESSION: When comparing across modalities to same day CT head, similar size of a right cerebral convexity subdural hemorrhage. Similar leftward midline shift. Electronically Signed   By: Margaretha Sheffield M.D.   On: 06/06/2022 13:53   EEG adult  Result Date: 06/06/2022 Lora Havens, MD     06/06/2022  4:12 PM Patient Name: Janice Arnold MRN: [id number] Epilepsy Attending: Lora Havens Referring Physician/Provider: Janine Ores, NP Date: 06/06/2022 Duration: 22.52 mins Patient history: 41 year old female with right subdural  hematoma.  EEG to evaluate for seizure. Level of alertness: Awake, asleep AEDs during EEG study: None Technical aspects: This EEG study was done with scalp electrodes positioned according to the 10-20 International system of electrode placement. Electrical activity was reviewed with band pass filter of 1-'70Hz'$ , sensitivity of 7 uV/mm, display speed of 67m/sec with a '60Hz'$  notched filter applied as appropriate. EEG data were recorded continuously and digitally stored.  Video monitoring was available and reviewed as appropriate. Description: The posterior dominant rhythm consists of '9Hz'$  activity of moderate voltage (25-35 uV) seen predominantly in posterior head regions, symmetric and reactive to eye opening and eye closing. Sleep was characterized by vertex waves, sleep spindles (12 to 14 Hz), maximal frontocentral region.  There is an excessive amount of 15 to 18 Hz beta activity distributed symmetrically and diffusely. Hyperventilation and photic stimulation were not performed. Of note,  study was technically difficult due to significant electrode and sweat artifact. ABNORMALITY - Excessive beta, generalized IMPRESSION: This technically difficult study is within normal limits. The excessive beta activity seen in the background is most likely due to the effect of benzodiazepine and is a benign EEG pattern. No seizures or epileptiform discharges were seen throughout the recording. A normal interictal EEG does not exclude the diagnosis of epilepsy. PLora Havens  CT Head Wo Contrast  Result Date: 06/06/2022 CLINICAL DATA:  Altered mental status.  Seizure. EXAM: CT HEAD WITHOUT CONTRAST TECHNIQUE: Contiguous axial images were obtained from the base of the skull through the vertex without intravenous contrast. RADIATION DOSE REDUCTION: This exam was performed according to the departmental dose-optimization program which includes automated exposure control, adjustment of the mA and/or kV according to patient size  and/or use of iterative reconstruction technique. COMPARISON:  None Available. FINDINGS: Brain: Right cerebral convexity chronic subdural hematoma/hygroma measuring 1.3 cm in maximal thickness with mild mass effect on the cerebral sulci and 3 mm right-to-left midline shift. No evidence of hemorrhage, acute infarction, hydrocephalus, or mass lesion. Vascular: No hyperdense vessel or unexpected calcification. Skull: Normal. Negative for fracture or focal lesion. Sinuses/Orbits: No acute finding. Other: None. IMPRESSION: 1. 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 No acute intracranial hemorrhage. Electronically Signed   By: WTitus DubinM.D.   On: 06/06/2022 08:33   DG Chest Portable 1 View  Result Date: 06/06/2022 CLINICAL DATA:  Altered mental status.  Seizure. EXAM: PORTABLE CHEST 1 VIEW COMPARISON:  None Available. FINDINGS: The heart size and mediastinal contours are within normal limits. Both lungs are clear. The visualized skeletal structures are unremarkable. IMPRESSION: No active disease. Electronically Signed   By: WOrville GovernD.  On: 06/06/2022 08:26   US Abdomen Limited RUQ (LIVER/GB)  Result Date: 05/29/2022 CLINICAL DATA: Intractable nausea, vomiting EXAM: ULTRASOUND ABDOMEN LIMITED RIGHT UPPER QUADRANT COMPARISON:  CT 03/02/2022 FINDINGS: Gallbladder: No gallstones or wall thickening visualized. No sonographic Murphy sign noted by sonographer. Common bile duct: Diameter: Normal caliber, 4 mm Liver: Insert fatty low portal vein is patent on color Doppler imaging with normal direction of blood flow towards the liver. Other: None. IMPRESSION: Fatty liver. No acute findings. Electronically Signed   By: Rolm Baptise M.D.   On: 05/29/2022 01:12    Microbiology: Results for orders placed or performed during the hospital encounter of 05/28/22  SARS Coronavirus 2 by RT PCR  (hospital order, performed in [facility] hospital lab) *cepheid single result test* Anterior Nasal Swab     Status: None   Collection Time: 05/29/22 12:11 AM   Specimen: Anterior Nasal Swab  Result Value Ref Range Status   SARS Coronavirus 2 by RT PCR NEGATIVE NEGATIVE Final    Comment: (NOTE) SARS-CoV-2 target nucleic acids are NOT DETECTED.  The SARS-CoV-2 RNA is generally detectable in upper and lower respiratory specimens during the acute phase of infection. The lowest concentration of SARS-CoV-2 viral copies this assay can detect is 250 copies / mL. A negative result does not preclude SARS-CoV-2 infection and should not be used as the sole basis for treatment or other patient management decisions.  A negative result may occur with improper specimen collection / handling, submission of specimen other than nasopharyngeal swab, presence of viral mutation(s) within the areas targeted by this assay, and inadequate number of viral copies (<250 copies / mL). A negative result must be combined with clinical observations, patient history, and epidemiological information.  Fact Sheet for Patients:   https://www.patel.info/  Fact Sheet for Healthcare Providers: https://hall.com/  This test is not yet approved or  cleared by the [place] FDA and has been authorized for detection and/or diagnosis of SARS-CoV-2 by FDA under an Emergency Use Authorization (EUA).  This EUA will remain in effect (meaning this test can be used) for the duration of the COVID-19 declaration under Section 564(b)(1) of the Act, 21 U.S.C. section 360bbb-3(b)(1), unless the authorization is terminated or revoked sooner.  Performed at [facility], [place] [address] [place], [place] [address]     Labs: CBC: Recent Labs  Lab 06/06/22 [phone] 06/06/22 0641 06/07/22 0558  WBC 5.3  --  4.8  NEUTROABS 2.9  --   --   HGB 11.7* 13.3 10.6*  HCT 34.4*  39.0 30.3*  MCV 117.0*  --  116.5*  PLT 227  --  761   Basic Metabolic Panel: Recent Labs  Lab 06/06/22 0639 06/06/22 0641 06/07/22 0558 06/09/22 0246  NA 137 136 137 134*  K 3.2* 3.2* 4.2 4.2  CL 101 101 103 103  CO2 20*  --  24 23  GLUCOSE 76 76 131* 132*  BUN <5* <3* <5* 8  CREATININE 0.64 0.50 0.63 0.70  CALCIUM 8.8*  --  9.0 9.3  MG 1.4*  --   --  1.8   Liver Function Tests: Recent Labs  Lab 06/06/22 0639  AST 70*  ALT 24  ALKPHOS 54  BILITOT 1.8*  PROT 6.8  ALBUMIN 2.8*   CBG: No results for input(s): "GLUCAP" in the last 168 hours.  Discharge time spent: greater than 30 minutes.  Signed: Tawni Millers, MD [facility] 06/09/2022

## 2022-06-09 NOTE — Care Management (Signed)
    Durable Medical Equipment  (From admission, onward)           Start     Ordered   06/09/22 1133  For home use only DME Bedside commode  Once       Question:  Patient needs a bedside commode to treat with the following condition  Answer:  Weakness   06/09/22 1133

## 2022-06-09 NOTE — Evaluation (Signed)
Occupational Therapy Evaluation Patient Details Name: Janice Arnold MRN: [id number] DOB: Dec 31, 1980 Today's Date: 06/09/2022   History of Present Illness 41 yo female who presented with seizure. Witnessed event tonic clonic movements for one minute with post event confusion. She had at least one other episode at home and one in the ED with decreased responsiveness; with the past medical history of alcohol abuse, on CIWA protocol   Clinical Impression   PTA, pt lived with her family and was independent in ADL, IADL, working, and driving. Upon eval, pt presenting with generalized weakness LUE weaker as compared to R, BUE mild tremor, decreased balance, strength, processing speed, attention, and problem solving. Pt performing basic UB ADL with set-up and LB ADL with min guard A. Performing shower transfer and navigating obstacles in functional mobility path with up to min A. Performing in-hand manipulation with multiple trials and significantly increased time. Pt requires increased time for simple money management and min cues for following multistep commands. Recommending OP OT to optimize safety and independence in ADL and IADL.      Recommendations for follow up therapy are one component of a multi-disciplinary discharge planning process, led by the attending physician.  Recommendations may be updated based on patient status, additional functional criteria and insurance authorization.   Follow Up Recommendations  Outpatient OT    Assistance Recommended at Discharge Intermittent Supervision/Assistance  Patient can return home with the following A little help with walking and/or transfers;A little help with bathing/dressing/bathroom;Assistance with cooking/housework;Direct supervision/assist for medications management;Direct supervision/assist for financial management;Assist for transportation;Help with stairs or ramp for entrance (Recommend second set of eyes initially with return to financial  management and medication management)    Functional Status Assessment  Patient has had a recent decline in their functional status and demonstrates the ability to make significant improvements in function in a reasonable and predictable amount of time.  Equipment Recommendations  BSC/3in1    Recommendations for Other Services       Precautions / Restrictions Precautions Precautions: Fall Restrictions Weight Bearing Restrictions: No      Mobility Bed Mobility Overal bed mobility: Modified Independent                  Transfers Overall transfer level: Needs assistance Equipment used: None Transfers: Sit to/from Stand Sit to Stand: Min guard           General transfer comment: Min guard A for safety      Balance Overall balance assessment: Mild deficits observed, not formally tested                                         ADL either performed or assessed with clinical judgement   ADL Overall ADL's : Needs assistance/impaired Eating/Feeding: Modified independent;Sitting   Grooming: Oral care;Min guard;Standing Grooming Details (indicate cue type and reason): Min guard A for safety. Mild tremor continues to be present with perfromance Upper Body Bathing: Set up;Sitting   Lower Body Bathing: Min guard;Sit to/from stand   Upper Body Dressing : Set up;Sitting   Lower Body Dressing: Min guard;Sit to/from stand Lower Body Dressing Details (indicate cue type and reason): donning socks sitting EOB Toilet Transfer: Min guard;Ambulation;Comfort height toilet Toilet Transfer Details (indicate cue type and reason): min guard A for safety Toileting- Clothing Manipulation and Hygiene: Set up;Sitting/lateral lean   Tub/ Shower Transfer: Minimal assistance;Ambulation;BSC/3in1 Clinical cytogeneticist Details (  indicate cue type and reason): light assist for shower transfer Functional mobility during ADLs: Min guard General ADL Comments: Min guard A for  safety. Min A for more challenging balance tasks. Pt aware of balance deficits and requesting not to attempt initially due to fear of falling     Vision Baseline Vision/History: 0 No visual deficits Ability to See in Adequate Light: 0 Adequate Patient Visual Report: No change from baseline Vision Assessment?: No apparent visual deficits Additional Comments: intact finger to nose and reciprocating touching of nose with outstretched arms     Perception     Praxis      Pertinent Vitals/Pain Pain Assessment Pain Assessment: Faces Faces Pain Scale: No hurt Pain Intervention(s): Monitored during session     Hand Dominance Right   Extremity/Trunk Assessment Upper Extremity Assessment Upper Extremity Assessment: Generalized weakness;LUE deficits/detail;RUE deficits/detail RUE Deficits / Details: BUE mild tremor affects ability bilateraly to perform in hand manipulation LUE Deficits / Details: LUE strength 4-/5. BUE mild tremor affects ability bilateraly to perform in hand manipulation   Lower Extremity Assessment Lower Extremity Assessment: Defer to PT evaluation   Cervical / Trunk Assessment Cervical / Trunk Assessment: Normal   Communication Communication Communication: No difficulties   Cognition Arousal/Alertness: Awake/alert Behavior During Therapy: WFL for tasks assessed/performed Overall Cognitive Status: Impaired/Different from baseline Area of Impairment: Following commands, Problem solving, Attention                   Current Attention Level: Selective (some difficulty with attention in distracting environment)   Following Commands: Follows one step commands consistently, Follows one step commands with increased time, Follows multi-step commands with increased time     Problem Solving: Slow processing, Requires verbal cues General Comments: Pt reporting her thinking feels off and slower than normal. She reports she does not feel she can return to her job  until cognition improves. Pt performing simple money management task with increased time, stating months of year in reverse order with increased time, and following up to three step commands with incresed time and min cues.     General Comments  VSS, husband present    Exercises     Shoulder Instructions      Home Living Family/patient expects to be discharged to:: Private residence Living Arrangements: Spouse/significant other;Children (20, 16, 12) Available Help at Discharge: Family;Available PRN/intermittently Type of Home: House Home Access: Level entry     Home Layout: Two level;Able to live on main level with bedroom/bathroom (laundry upsrairs, but pt will not have to go up there for several days.) Alternate Level Stairs-Number of Steps: 14   Bathroom Shower/Tub: Teacher, early years/pre: Handicapped height     Home Equipment: Shower seat (Pt reports she used to have a shower seat, but is unsure where it is and if is accessible)          Prior Functioning/Environment Prior Level of Function : Independent/Modified Independent;Driving             Mobility Comments: no AD ADLs Comments: Government social research officer, driving, independent in ADL and IADL        OT Problem List: Decreased strength;Decreased activity tolerance;Impaired balance (sitting and/or standing);Decreased cognition;Decreased knowledge of use of DME or AE;Impaired UE functional use      OT Treatment/Interventions: Self-care/ADL training;Therapeutic exercise;DME and/or AE instruction;Therapeutic activities;Cognitive remediation/compensation;Patient/family education;Balance training    OT Goals(Current goals can be found in the care plan section) Acute Rehab OT Goals Patient Stated Goal: be  safe at home and feel myself OT Goal Formulation: With patient Time For Goal Achievement: 06/23/22 Potential to Achieve Goals: Good  OT Frequency: Min 2X/week    Co-evaluation              AM-PAC OT  "6 Clicks" Daily Activity     Outcome Measure Help from another person eating meals?: None Help from another person taking care of personal grooming?: A Little Help from another person toileting, which includes using toliet, bedpan, or urinal?: A Little Help from another person bathing (including washing, rinsing, drying)?: A Little Help from another person to put on and taking off regular upper body clothing?: A Little Help from another person to put on and taking off regular lower body clothing?: A Little 6 Click Score: 19   End of Session Equipment Utilized During Treatment: Gait belt Nurse Communication: Mobility status  Activity Tolerance: Patient tolerated treatment well Patient left: in bed;with call bell/phone within reach;with family/visitor present  OT Visit Diagnosis: Unsteadiness on feet (R26.81);Muscle weakness (generalized) (M62.81);Other abnormalities of gait and mobility (R26.89);Other symptoms and signs involving cognitive function                Time: 1049-1110 OT Time Calculation (min): 21 min Charges:  OT General Charges $OT Visit: 1 Visit OT Evaluation $OT Eval Moderate Complexity: 1 Mod  Shanda Howells, OTR/L [facility] Acute Rehabilitation Office: [phone]   Lula Olszewski 06/09/2022, 11:37 AM

## 2022-06-09 NOTE — Evaluation (Signed)
Physical Therapy Evaluation and Discharge Patient Details Name: Janice Arnold MRN: [id number] DOB: 1980-08-18 Today's Date: 06/09/2022  History of Present Illness  41 yo female who presented with seizure. Witnessed event tonic clonic movements for one minute with post event confusion. She had at least one other episode at home and one in the ED with decreased responsiveness; with the past medical history of alcohol abuse, on CIWA protocol  Clinical Impression   Patient evaluated by Physical Therapy with no further acute PT needs identified. All education has been completed and the patient has no further questions. Pt agrees to go to Outpt PT;  See below for any follow-up Physical Therapy or equipment needs. PT is signing off. Thank you for this referral.        Recommendations for follow up therapy are one component of a multi-disciplinary discharge planning process, led by the attending physician.  Recommendations may be updated based on patient status, additional functional criteria and insurance authorization.  Follow Up Recommendations Outpatient PT      Assistance Recommended at Discharge PRN  Patient can return home with the following  Assist for transportation;Assistance with cooking/housework    Equipment Recommendations Other (comment) (Pt and husband considering getting a cane)  Recommendations for Other Services       Functional Status Assessment Patient has had a recent decline in their functional status and demonstrates the ability to make significant improvements in function in a reasonable and predictable amount of time.     Precautions / Restrictions Precautions Precautions: Fall Restrictions Weight Bearing Restrictions: No      Mobility  Bed Mobility Overal bed mobility: Modified Independent                  Transfers Overall transfer level: Needs assistance Equipment used: None Transfers: Sit to/from Stand Sit to Stand: Supervision            General transfer comment: Sues to self-monitor for activity tolerance    Ambulation/Gait Ambulation/Gait assistance: Supervision Gait Distance (Feet): 300 Feet Assistive device: None Gait Pattern/deviations: Step-through pattern       General Gait Details: mild unsteadiness with hard footfalls/heel strikes bilaterally  Stairs Stairs: Yes Stairs assistance: Modified independent (Device/Increase time) Stair Management: One rail Right, Alternating pattern, Forwards Number of Stairs: 5 General stair comments: No difficulty; seems to prefer UE suport on rail  Wheelchair Mobility    Modified Rankin (Stroke Patients Only)       Balance Overall balance assessment: Mild deficits observed, not formally tested                                           Pertinent Vitals/Pain Pain Assessment Faces Pain Scale: No hurt    Home Living Family/patient expects to be discharged to:: Private residence Living Arrangements: Spouse/significant other;Children (20, 16, 12) Available Help at Discharge: Family;Available PRN/intermittently Type of Home: House Home Access: Level entry     Alternate Level Stairs-Number of Steps: 14 Home Layout: Two level;Able to live on main level with bedroom/bathroom (laundry upsrairs, but pt will not have to go up there for several days.) Home Equipment: Shower seat (Pt reports she used to have a shower seat, but is unsure where it is and if is accessible)      Prior Function Prior Level of Function : Independent/Modified Independent;Driving  Mobility Comments: no AD ADLs 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 BUE mild tremor affects ability bilateraly to perform in hand manipulation    Lower Extremity Assessment Lower Extremity Assessment: Generalized weakness;LLE deficits/detail (hips, knees, generally weak bilaterally and symetrically) LLE Deficits / Details: L ankle dorsiflexion 3+/5, notably weaker than R    Cervical / Trunk Assessment Cervical / Trunk Assessment: Normal  Communication   Communication: No difficulties  Cognition Arousal/Alertness: Awake/alert Behavior During Therapy: WFL for tasks assessed/performed Overall Cognitive Status: Impaired/Different from baseline Area of Impairment: Following commands, Problem solving, Attention                   Current Attention Level: Selective (some difficulty with attention in distracting environment)   Following Commands: Follows one step commands consistently, Follows one step commands with increased time, Follows multi-step commands with increased time     Problem Solving: Slow processing, Requires verbal cues General Comments: Pt reporting her thinking feels off and slower than normal. She reports she does not feel she can return to her job until cognition improves. Pt performing simple money management task with increased time, stating months of year in reverse order with increased time, and following up to three step commands with incresed time and min cues.        General Comments General comments (skin integrity, edema, etc.): VSS, husband present    Exercises     Assessment/Plan    PT Assessment All further PT needs can be met in the next venue of care  PT Problem List Decreased strength;Decreased activity tolerance;Decreased balance;Decreased coordination       PT Treatment Interventions      PT Goals (Current goals can be found in the Care Plan section)  Acute Rehab PT Goals Patient Stated Goal: better balance; feel more steady when wlaking PT Goal Formulation: All assessment and education  complete, DC therapy    Frequency       Co-evaluation               AM-PAC PT "6 Clicks" Mobility  Outcome Measure Help needed turning from your back to your side while in a flat bed without using bedrails?: None Help needed moving from lying on your back to sitting on the side of a flat bed without using bedrails?: None Help needed moving to and from a bed to a chair (including a wheelchair)?: None Help needed standing up from a chair using your arms (e.g., wheelchair or bedside chair)?: None Help needed to walk in hospital room?: A Little Help needed climbing 3-5 steps with a railing? : A Little 6 Click Score: 22    End of Session Equipment Utilized During Treatment: Gait belt Activity Tolerance: Patient tolerated treatment well Patient left: in bed;with call bell/phone within reach;with family/visitor present Nurse Communication: Mobility status PT Visit Diagnosis: Unsteadiness on feet (R26.81);Other abnormalities of gait and mobility (R26.89)    Time: 2694-8546 PT Time Calculation (min) (ACUTE ONLY): 22 min   Charges:   PT Evaluation $PT Eval Low Complexity: 1 Low          Roney Marion, PT  Acute Rehabilitation Services Office [place]  06/09/2022, 12:42 PM

## 2022-06-09 NOTE — Progress Notes (Signed)
Nursing dc note  Alert and oriented verbalized understanding of instructions. Ccmd notified. All belongings given to patient including bedside commode

## 2022-06-09 NOTE — Plan of Care (Signed)
Problem: Education: Goal: Expressions of having a comfortable level of knowledge regarding the disease process will increase Outcome: Progressing   Problem: Coping: Goal: Ability to adjust to condition or change in health will improve Outcome: Progressing Goal: Ability to identify appropriate support needs will improve Outcome: Progressing   Problem: Health Behavior/Discharge Planning: Goal: Compliance with prescribed medication regimen will improve Outcome: Progressing   Problem: Medication: Goal: Risk for medication side effects will decrease Outcome: Progressing   Problem: Clinical Measurements: Goal: Complications related to the disease process, condition or treatment will be avoided or minimized Outcome: Progressing Goal: Diagnostic test results will improve Outcome: Progressing   Problem: Safety: Goal: Verbalization of understanding the information provided will improve Outcome: Progressing   Problem: Self-Concept: Goal: Level of anxiety will decrease Outcome: Progressing Goal: Ability to verbalize feelings about condition will improve Outcome: Progressing   Problem: Education: Goal: Knowledge of General Education information will improve Description: Including pain rating scale, medication(s)/side effects and non-pharmacologic comfort measures Outcome: Progressing   Problem: Health Behavior/Discharge Planning: Goal: Ability to manage health-related needs will improve Outcome: Progressing   Problem: Clinical Measurements: Goal: Ability to maintain clinical measurements within normal limits will improve Outcome: Progressing Goal: Will remain free from infection Outcome: Progressing Goal: Diagnostic test results will improve Outcome: Progressing Goal: Respiratory complications will improve Outcome: Progressing Goal: Cardiovascular complication will be avoided Outcome: Progressing   Problem: Activity: Goal: Risk for activity intolerance will  decrease Outcome: Progressing   Problem: Nutrition: Goal: Adequate nutrition will be maintained Outcome: Progressing   Problem: Coping: Goal: Level of anxiety will decrease Outcome: Progressing   Problem: Elimination: Goal: Will not experience complications related to bowel motility Outcome: Progressing Goal: Will not experience complications related to urinary retention Outcome: Progressing   Problem: Pain Managment: Goal: General experience of comfort will improve Outcome: Progressing   Problem: Safety: Goal: Ability to remain free from injury will improve Outcome: Progressing   Problem: Skin Integrity: Goal: Risk for impaired skin integrity will decrease Outcome: Progressing   Problem: Education: Goal: Expressions of having a comfortable level of knowledge regarding the disease process will increase Outcome: Progressing   Problem: Coping: Goal: Ability to adjust to condition or change in health will improve Outcome: Progressing Goal: Ability to identify appropriate support needs will improve Outcome: Progressing   Problem: Health Behavior/Discharge Planning: Goal: Compliance with prescribed medication regimen will improve Outcome: Progressing   Problem: Medication: Goal: Risk for medication side effects will decrease Outcome: Progressing   Problem: Clinical Measurements: Goal: Complications related to the disease process, condition or treatment will be avoided or minimized Outcome: Progressing Goal: Diagnostic test results will improve Outcome: Progressing   Problem: Safety: Goal: Verbalization of understanding the information provided will improve Outcome: Progressing   Problem: Self-Concept: Goal: Level of anxiety will decrease Outcome: Progressing Goal: Ability to verbalize feelings about condition will improve Outcome: Progressing   Problem: Education: Goal: Knowledge of General Education information will improve Description: Including pain  rating scale, medication(s)/side effects and non-pharmacologic comfort measures Outcome: Progressing   Problem: Health Behavior/Discharge Planning: Goal: Ability to manage health-related needs will improve Outcome: Progressing   Problem: Clinical Measurements: Goal: Ability to maintain clinical measurements within normal limits will improve Outcome: Progressing Goal: Will remain free from infection Outcome: Progressing Goal: Diagnostic test results will improve Outcome: Progressing Goal: Respiratory complications will improve Outcome: Progressing Goal: Cardiovascular complication will be avoided Outcome: Progressing   Problem: Activity: Goal: Risk for activity intolerance will decrease Outcome: Progressing   Problem: Nutrition:  Goal: Adequate nutrition will be maintained Outcome: Progressing   Problem: Coping: Goal: Level of anxiety will decrease Outcome: Progressing   Problem: Elimination: Goal: Will not experience complications related to bowel motility Outcome: Progressing Goal: Will not experience complications related to urinary retention Outcome: Progressing   Problem: Pain Managment: Goal: General experience of comfort will improve Outcome: Progressing   Problem: Safety: Goal: Ability to remain free from injury will improve Outcome: Progressing   Problem: Skin Integrity: Goal: Risk for impaired skin integrity will decrease Outcome: Progressing   Problem: Education: Goal: Expressions of having a comfortable level of knowledge regarding the disease process will increase Outcome: Progressing   Problem: Coping: Goal: Ability to adjust to condition or change in health will improve Outcome: Progressing Goal: Ability to identify appropriate support needs will improve Outcome: Progressing   Problem: Health Behavior/Discharge Planning: Goal: Compliance with prescribed medication regimen will improve Outcome: Progressing   Problem: Medication: Goal: Risk  for medication side effects will decrease Outcome: Progressing   Problem: Clinical Measurements: Goal: Complications related to the disease process, condition or treatment will be avoided or minimized Outcome: Progressing Goal: Diagnostic test results will improve Outcome: Progressing   Problem: Safety: Goal: Verbalization of understanding the information provided will improve Outcome: Progressing   Problem: Self-Concept: Goal: Level of anxiety will decrease Outcome: Progressing Goal: Ability to verbalize feelings about condition will improve Outcome: Progressing   Problem: Education: Goal: Knowledge of General Education information will improve Description: Including pain rating scale, medication(s)/side effects and non-pharmacologic comfort measures Outcome: Progressing   Problem: Health Behavior/Discharge Planning: Goal: Ability to manage health-related needs will improve Outcome: Progressing   Problem: Clinical Measurements: Goal: Ability to maintain clinical measurements within normal limits will improve Outcome: Progressing Goal: Will remain free from infection Outcome: Progressing Goal: Diagnostic test results will improve Outcome: Progressing Goal: Respiratory complications will improve Outcome: Progressing Goal: Cardiovascular complication will be avoided Outcome: Progressing   Problem: Activity: Goal: Risk for activity intolerance will decrease Outcome: Progressing   Problem: Nutrition: Goal: Adequate nutrition will be maintained Outcome: Progressing   Problem: Coping: Goal: Level of anxiety will decrease Outcome: Progressing   Problem: Elimination: Goal: Will not experience complications related to bowel motility Outcome: Progressing Goal: Will not experience complications related to urinary retention Outcome: Progressing   Problem: Pain Managment: Goal: General experience of comfort will improve Outcome: Progressing   Problem: Safety: Goal:  Ability to remain free from injury will improve Outcome: Progressing   Problem: Skin Integrity: Goal: Risk for impaired skin integrity will decrease Outcome: Progressing

## 2022-06-10 ENCOUNTER — Telehealth: Payer: Self-pay

## 2022-06-10 NOTE — Telephone Encounter (Signed)
Transition Care Management Unsuccessful Follow-up Telephone Call  Date of discharge and from where:  [facility] 06/09/2022  Attempts:  1st Attempt  Reason for unsuccessful TCM follow-up call:  Left voice message Juanda Crumble, [place] Direct Dial [phone]

## 2022-06-11 NOTE — Telephone Encounter (Signed)
Transition Care Management Unsuccessful Follow-up Telephone Call  Date of discharge and from where:  [facility] 06/09/2022  Attempts:  2nd Attempt  Reason for unsuccessful TCM follow-up call:  Left voice message Juanda Crumble, [place] Direct Dial [phone]

## 2022-06-12 NOTE — Telephone Encounter (Signed)
Transition Care Management Unsuccessful Follow-up Telephone Call  Date of discharge and from where:  [facility] 06/09/2022  Attempts:  3rd Attempt  Reason for unsuccessful TCM follow-up call:  Left voice message, unable to contact patient Janice Arnold, [place] Direct Dial [phone]

## 2022-06-13 ENCOUNTER — Other Ambulatory Visit: Payer: Self-pay | Admitting: Family Medicine

## 2022-06-13 ENCOUNTER — Telehealth: Payer: Self-pay | Admitting: Family Medicine

## 2022-06-13 MED ORDER — LORAZEPAM 1 MG PO TABS: 1.0000 mg | ORAL_TABLET | Freq: Four times a day (QID) | ORAL | 0 refills | Status: DC | PRN

## 2022-06-13 NOTE — Telephone Encounter (Signed)
LAST APPOINTMENT DATE: 05/28/22  NEXT APPOINTMENT DATE: 06/17/22  MEDICATION: LORazepam (ATIVAN) 1 MG tablet   Is the patient out of medication? Has 2 pills left  PHARMACY: [id number] [id number]: [phone] Fax: [phone]   Patient states: -Medication was initially filled by provider from hospital admission on 10/26 - She tried to get him to refill it but he stated PCP would have to do so  - She has hospital f/u with PCP on 06/17/22

## 2022-06-13 NOTE — Telephone Encounter (Signed)
Has appt 11/6

## 2022-06-17 ENCOUNTER — Ambulatory Visit (INDEPENDENT_AMBULATORY_CARE_PROVIDER_SITE_OTHER): Payer: Managed Care, Other (non HMO) | Admitting: Family Medicine

## 2022-06-17 ENCOUNTER — Encounter: Payer: Self-pay | Admitting: Family Medicine

## 2022-06-17 DIAGNOSIS — F4323 Adjustment disorder with mixed anxiety and depressed mood: Secondary | ICD-10-CM

## 2022-06-17 DIAGNOSIS — E538 Deficiency of other specified B group vitamins: Secondary | ICD-10-CM | POA: Diagnosis not present

## 2022-06-17 DIAGNOSIS — R569 Unspecified convulsions: Secondary | ICD-10-CM

## 2022-06-17 DIAGNOSIS — R7989 Other specified abnormal findings of blood chemistry: Secondary | ICD-10-CM

## 2022-06-17 DIAGNOSIS — S065XAA Traumatic subdural hemorrhage with loss of consciousness status unknown, initial encounter: Secondary | ICD-10-CM

## 2022-06-17 DIAGNOSIS — E876 Hypokalemia: Secondary | ICD-10-CM | POA: Diagnosis not present

## 2022-06-17 LAB — COMPREHENSIVE METABOLIC PANEL
ALT: 476 U/L — ABNORMAL HIGH (ref 0–35)
AST: 238 U/L — ABNORMAL HIGH (ref 0–37)
Albumin: 4 g/dL (ref 3.5–5.2)
Alkaline Phosphatase: 37 U/L — ABNORMAL LOW (ref 39–117)
BUN: 15 mg/dL (ref 6–23)
CO2: 25 mEq/L (ref 19–32)
Calcium: 9.2 mg/dL (ref 8.4–10.5)
Chloride: 100 mEq/L (ref 96–112)
Creatinine, Ser: 0.53 mg/dL (ref 0.40–1.20)
GFR: 115.28 mL/min (ref >60.00)
Glucose, Bld: 71 mg/dL (ref 70–99)
Potassium: 4.4 mEq/L (ref 3.5–5.1)
Sodium: 134 mEq/L — ABNORMAL LOW (ref 135–145)
Total Bilirubin: 1.3 mg/dL — ABNORMAL HIGH (ref 0.2–1.2)
Total Protein: 6.5 g/dL (ref 6.0–8.3)

## 2022-06-17 LAB — CBC
HCT: 40.6 % (ref 36.0–46.0)
Hemoglobin: 13.3 g/dL (ref 12.0–15.0)
MCHC: 32.6 g/dL (ref 30.0–36.0)
MCV: 115 fl — ABNORMAL HIGH (ref 78.0–100.0)
Platelets: 402 10*3/uL — ABNORMAL HIGH (ref 150.0–400.0)
RBC: 3.53 Mil/uL — ABNORMAL LOW (ref 3.87–5.11)
RDW: 18.7 % — ABNORMAL HIGH (ref 11.5–15.5)
WBC: 15.5 10*3/uL — ABNORMAL HIGH (ref 4.0–10.5)

## 2022-06-17 LAB — MAGNESIUM: Magnesium: 1.8 mg/dL (ref 1.5–2.5)

## 2022-06-17 MED ORDER — LORAZEPAM 2 MG PO TABS: 2.0000 mg | ORAL_TABLET | Freq: Four times a day (QID) | ORAL | 0 refills | Status: DC | PRN

## 2022-06-17 MED ORDER — CYANOCOBALAMIN 1000 MCG/ML IJ SOLN
1000.0000 ug | Freq: Once | INTRAMUSCULAR | Status: AC
  Administered 2022-06-17: 1000 ug via INTRAMUSCULAR

## 2022-06-17 NOTE — Patient Instructions (Signed)
It was very nice to see you today!  Stop wellbutrin and phentermine for now.  Call neurologist to see if repeat CT needed or what to do about steroids.    PLEASE NOTE:  If you had any lab tests please let us know if you have not heard back within a few days. You may see your results on MyChart before we have a chance to review them but we will give you a call once they are reviewed by [place]. If we ordered any referrals today, please let us know if you have not heard from their office within the next week.   Please try these tips to maintain a healthy lifestyle:  Eat most of your calories during the day when you are active. Eliminate processed foods including packaged sweets (pies, cakes, cookies), reduce intake of potatoes, white bread, white pasta, and white rice. Look for whole grain options, oat flour or almond flour.  Each meal should contain half fruits/vegetables, one quarter protein, and one quarter carbs (no bigger than a computer mouse).  Cut down on sweet beverages. This includes juice, soda, and sweet tea. Also watch fruit intake, though this is a healthier sweet option, it still contains natural sugar! Limit to 3 servings daily.  Drink at least 1 glass of water with each meal and aim for at least 8 glasses per day  Exercise at least 150 minutes every week.

## 2022-06-17 NOTE — Progress Notes (Signed)
Subjective:     Patient ID: Janice Arnold, female    DOB: February 16, 1981, 41 y.o.   MRN: [id number]  Chief Complaint  Patient presents with   [facility] follow-up, had two visits since last office visit      HPI  Vomiting-was not drinking ETOH since June. Hosp. D/c.   After that, went to party-had a few drinks, then sz and back to hosp.   Sz disorder-Will see neuro end of month.   No fall or trauma but SAH but had sz prior to MRI.  Pt concerned about stopping decadron.   3.  Anxiety-taking Ativan '2mg'$ (pt self increased) every 6 hrs as body still very shakey and tongue shakey.   Shaking started after sz and hasn't gone away.   No SI. 4.  Low K-taking bid but hosp said stop as "normal"  but had just gotten K in hosp.  5.  Vision blurry now-saw ophth and retina and did laser. Sees ophth this afternoon.  Hard to read still.    Health Maintenance Due  Topic Date Due   MAMMOGRAM  04/29/2020    Past Medical History:  Diagnosis Date   Alcohol abuse    Anemia    Anxiety    Fatty liver due to alcoholism    History of laparoscopic appendectomy 03/02/2022   Menorrhagia    Vitamin deficiency     Past Surgical History:  Procedure Laterality Date   APPENDECTOMY  03/02/2022   LAPAROSCOPIC APPENDECTOMY N/A 03/02/2022   Procedure: APPENDECTOMY LAPAROSCOPIC;  Surgeon: Donnie Mesa, MD;  Location: [facility];  Service: General;  Laterality: N/A;    Outpatient Medications Prior to Visit  Medication Sig Dispense Refill   acetaminophen (TYLENOL) 500 MG tablet Take 1,000 mg by mouth 2 (two) times daily as needed (pain).     escitalopram (LEXAPRO) 20 MG tablet Take 1 tablet (20 mg total) by mouth daily. 90 tablet 1   levETIRAcetam (KEPPRA) 500 MG tablet Take 1 tablet (500 mg total) by mouth 2 (two) times daily. 60 tablet 0   LOW-OGESTREL 0.3-30 MG-MCG tablet Take 1 tablet by mouth daily.     melatonin 1 MG TABS tablet Take 1 mg by mouth at bedtime as needed (sleep).     norethindrone  (AYGESTIN) 5 MG tablet Take 10 mg by mouth daily.     omeprazole (PRILOSEC) 40 MG capsule Take 1 capsule (40 mg total) by mouth daily. (Patient taking differently: Take 40 mg by mouth daily as needed (heartburn).) 90 capsule 1   ondansetron (ZOFRAN-ODT) 4 MG disintegrating tablet Take 1 tablet (4 mg total) by mouth every 8 (eight) hours as needed for nausea or vomiting. 60 tablet 3   Vitamin D, Ergocalciferol, (DRISDOL) 1.25 MG (50000 UNIT) CAPS capsule TAKE 1 CAPSULE (50,000 UNITS) BY MOUTH EVERY 7 DAYS (Patient taking differently: Take 50,000 Units by mouth every 7 (seven) days.) 12 capsule 0   buPROPion (WELLBUTRIN XL) 150 MG 24 hr tablet TAKE 1 TABLET BY MOUTH EVERY DAY 90 tablet 1   dexamethasone (DECADRON) 4 MG tablet Take one tablet 3 times daily for three days, then 2 times daily for three days, then 1 time daily for three days, then stop. 18 tablet 0   LORazepam (ATIVAN) 1 MG tablet Take 1 tablet (1 mg total) by mouth every 6 (six) hours as needed for anxiety. 20 tablet 0   phentermine 37.5 MG capsule Take 1 capsule (37.5 mg total) by mouth every morning. [place]  capsule 0   No facility-administered medications prior to visit.    No Known Allergies ROS neg/noncontributory except as noted HPI/below      Objective:     BP 102/70   Pulse 85   Temp 97.7 F (36.5 C) (Temporal)   Ht '5\' 7"'$  (1.702 m)   Wt 181 lb 4 oz (82.2 kg)   LMP 05/09/2022 (Exact Date) Comment: has been on for last 8 weeks, stopped 2 weeks gao  SpO2 98%   BMI 28.39 kg/m  Wt Readings from Last 3 Encounters:  06/17/22 181 lb 4 oz (82.2 kg)  06/06/22 175 lb (79.4 kg)  05/28/22 174 lb 8 oz (79.2 kg)    Physical Exam   Gen: WDWN NAD HEENT: NCAT NECK:  supple, no thyromegaly, no nodes, no carotid bruits CARDIAC: RRR, S1S2+, no murmur.  LUNGS: CTAB. No wheezes MSK: fine tremor w/outstretched hands  NEURO: A&O x3.  CN II-XII intact.  PSYCH: normal mood. Good eye contact  Reviewed hosp records, very complex pt.   Discussed plan, med changes, etc.  63mn w/pt before, during and after appt.     Assessment & Plan:   Problem List Items Addressed This Visit       Other   Adjustment disorder with mixed anxiety and depressed mood   Hypokalemia   Relevant Orders   CBC (Completed)   Comprehensive metabolic panel (Completed)   Magnesium (Completed)   Hypomagnesemia - Primary   Relevant Orders   Comprehensive metabolic panel (Completed)   Magnesium (Completed)   Abnormal LFTs (liver function tests)   Seizure ([place]   Other Visit Diagnoses     B12 deficiency       Relevant Medications   cyanocobalamin (VITAMIN B12) injection 1,000 mcg (Completed)     1.  Hypokalemia/hypomagnesia-chronic.  Patient is currently on potassium 20 mEq twice daily-continue.  Check potassium/magnesium/BMP, CBC 2.  Abnormal LFTs-thought to be due to alcoholism.  Patient has not been drinking since June until a few at a party and then had a seizure.  Advised to quit drinking altogether and not even have any at all.  Recheck LFTs. 3.  Seizure disorder-new diagnosis.  Unclear if due to severe electrolyte imbalance, alcohol intake (however patient states she only had 2 drinks).  Not sure if due to addition of Wellbutrin 2 weeks prior to seizure.  Other.  She did have some dural hematoma.  She is currently on Keppra.  Just finished Decadron.  Advised to message neurology as far as possibility of needing another CAT scan and her concerns with being off of the Decadron.  Management per neuro. 4.  Adjustment disorder-patient has had increased tremors since starting Wellbutrin.  She is also on phentermine for weight.  Not sure if these medicines are causing the tremors and increased anxiety.  Will DC both Wellbutrin and phentermine and see if this improves.  Renewed Ativan 2 mg every 6 hours as needed.  Follow-up 3 to 4 weeks. 5.  Anemia-multifactorial.  Originally thought to be due to vomiting, cirrhosis, vaginal bleeding.  She has  followed with hematology and has received iron infusions and multiple blood transfusions.  She is currently on B12 injections-given 1 today.  Monitor closely.  She is scheduled for hysterectomy at the end of December.  Meds ordered this encounter  Medications   LORazepam (ATIVAN) 2 MG tablet    Sig: Take 1 tablet (2 mg total) by mouth every 6 (six) hours as needed for anxiety.  Dispense:  120 tablet    Refill:  0   cyanocobalamin (VITAMIN B12) injection 1,000 mcg    Wellington Hampshire, MD

## 2022-06-18 ENCOUNTER — Telehealth: Payer: Self-pay | Admitting: Family Medicine

## 2022-06-18 ENCOUNTER — Other Ambulatory Visit: Payer: Self-pay | Admitting: Family Medicine

## 2022-06-18 MED ORDER — LORAZEPAM 2 MG PO TABS: 2.0000 mg | ORAL_TABLET | Freq: Four times a day (QID) | ORAL | 0 refills | Status: DC | PRN

## 2022-06-18 NOTE — Addendum Note (Signed)
Addended by: Wellington Hampshire on: 06/18/2022 08:54 AM   Modules accepted: Orders

## 2022-06-18 NOTE — Telephone Encounter (Signed)
Please see message below

## 2022-06-18 NOTE — Telephone Encounter (Signed)
Patient states Dr. Cherlynn Kaiser called her and advised Patient to schedule Lab appointment in 1 week.  Please advise or place lab Orders.

## 2022-06-18 NOTE — Telephone Encounter (Signed)
Dr. Cherlynn Kaiser has already placed lab orders for patient to schedule in  1 week. Please schedule lab appointment only.

## 2022-06-18 NOTE — Telephone Encounter (Signed)
Patient states: -She was informed by [facility] on [address] that lorazepam was not in stock at location and they wouldn't know when it would be    Patient requests: -Lorazepam be sent in to [facility] at [address] [place], [place] [address]

## 2022-06-18 NOTE — Telephone Encounter (Signed)
Please see message below and advise.

## 2022-06-18 NOTE — Telephone Encounter (Signed)
Pt states: -at recent visit PCP advised to reach out to neurologist about getting a repeat CT scan. -Neurology office will not order a CT scans unless they see her, and cannot see her until the end of the month.  Pt Asks: -Is a CT scan at the end of the month acceptable, or does PCP want pt to have CT scan center.    Pt requests: -return call for follow up.

## 2022-06-18 NOTE — Progress Notes (Signed)
Suspect wbc elevated from decadron D/w pt-LFT's elevated-no ETOH per pt.  A lot of tylenol though-advised to stop or decrease to 500tid.  See addendum for details.  Same dose of potassium

## 2022-06-18 NOTE — Telephone Encounter (Signed)
Patient is scheduled for Lab appointment on 06/25/22

## 2022-06-21 ENCOUNTER — Other Ambulatory Visit: Payer: Managed Care, Other (non HMO)

## 2022-06-24 ENCOUNTER — Other Ambulatory Visit: Payer: Self-pay | Admitting: Family

## 2022-06-24 ENCOUNTER — Telehealth: Payer: Self-pay | Admitting: Family Medicine

## 2022-06-24 ENCOUNTER — Inpatient Hospital Stay: Payer: Managed Care, Other (non HMO) | Attending: Hematology & Oncology

## 2022-06-24 DIAGNOSIS — D500 Iron deficiency anemia secondary to blood loss (chronic): Secondary | ICD-10-CM

## 2022-06-24 DIAGNOSIS — D509 Iron deficiency anemia, unspecified: Secondary | ICD-10-CM | POA: Diagnosis not present

## 2022-06-24 LAB — CBC WITH DIFFERENTIAL (CANCER CENTER ONLY)
Abs Immature Granulocytes: 0.09 10*3/uL — ABNORMAL HIGH (ref 0.00–0.07)
Basophils Absolute: 0 10*3/uL (ref 0.0–0.1)
Basophils Relative: 0 %
Eosinophils Absolute: 0.2 10*3/uL (ref 0.0–0.5)
Eosinophils Relative: 2 %
HCT: 37 % (ref 36.0–46.0)
Hemoglobin: 12.2 g/dL (ref 12.0–15.0)
Immature Granulocytes: 1 %
Lymphocytes Relative: 21 %
Lymphs Abs: 2.3 10*3/uL (ref 0.7–4.0)
MCH: 36.9 pg — ABNORMAL HIGH (ref 26.0–34.0)
MCHC: 33 g/dL (ref 30.0–36.0)
MCV: 111.8 fL — ABNORMAL HIGH (ref 80.0–100.0)
Monocytes Absolute: 0.5 10*3/uL (ref 0.1–1.0)
Monocytes Relative: 4 %
Neutro Abs: 8.1 10*3/uL — ABNORMAL HIGH (ref 1.7–7.7)
Neutrophils Relative %: 72 %
Platelet Count: 264 10*3/uL (ref 150–400)
RBC: 3.31 MIL/uL — ABNORMAL LOW (ref 3.87–5.11)
RDW: 14.6 % (ref 11.5–15.5)
WBC Count: 11.2 10*3/uL — ABNORMAL HIGH (ref 4.0–10.5)
nRBC: 0 % (ref 0.0–0.2)

## 2022-06-24 LAB — IRON AND IRON BINDING CAPACITY (CC-WL,HP ONLY)
Iron: 37 ug/dL (ref 28–170)
Saturation Ratios: 11 % (ref 10.4–31.8)
TIBC: 328 ug/dL (ref 250–450)
UIBC: 291 ug/dL (ref 148–442)

## 2022-06-24 LAB — FERRITIN: Ferritin: 111 ng/mL (ref 11–307)

## 2022-06-24 NOTE — Telephone Encounter (Signed)
Patient states: -She was informed by PCP that she can't take ibuprofen or acetaminophen  - She currently has a headache and doesn't know what she can do or take to make it better   Patient requests: - Any recommendations that PCP may have  Please advise.

## 2022-06-24 NOTE — Telephone Encounter (Signed)
Please advise 

## 2022-06-24 NOTE — Telephone Encounter (Signed)
Patient notified and verbalized understanding. 

## 2022-06-25 ENCOUNTER — Other Ambulatory Visit (INDEPENDENT_AMBULATORY_CARE_PROVIDER_SITE_OTHER): Payer: Managed Care, Other (non HMO)

## 2022-06-25 ENCOUNTER — Ambulatory Visit (INDEPENDENT_AMBULATORY_CARE_PROVIDER_SITE_OTHER): Payer: Managed Care, Other (non HMO)

## 2022-06-25 DIAGNOSIS — R7989 Other specified abnormal findings of blood chemistry: Secondary | ICD-10-CM | POA: Diagnosis not present

## 2022-06-25 DIAGNOSIS — E538 Deficiency of other specified B group vitamins: Secondary | ICD-10-CM | POA: Diagnosis not present

## 2022-06-25 LAB — COMPREHENSIVE METABOLIC PANEL
ALT: 243 U/L — ABNORMAL HIGH (ref 0–35)
AST: 136 U/L — ABNORMAL HIGH (ref 0–37)
Albumin: 3.6 g/dL (ref 3.5–5.2)
Alkaline Phosphatase: 47 U/L (ref 39–117)
BUN: 10 mg/dL (ref 6–23)
CO2: 23 mEq/L (ref 19–32)
Calcium: 8.5 mg/dL (ref 8.4–10.5)
Chloride: 107 mEq/L (ref 96–112)
Creatinine, Ser: 0.41 mg/dL (ref 0.40–1.20)
GFR: 122.62 mL/min (ref >60.00)
Glucose, Bld: 102 mg/dL — ABNORMAL HIGH (ref 70–99)
Potassium: 3.7 mEq/L (ref 3.5–5.1)
Sodium: 137 mEq/L (ref 135–145)
Total Bilirubin: 0.6 mg/dL (ref 0.2–1.2)
Total Protein: 6 g/dL (ref 6.0–8.3)

## 2022-06-25 LAB — GAMMA GT: GGT: 255 U/L — ABNORMAL HIGH (ref 7–51)

## 2022-06-25 MED ORDER — CYANOCOBALAMIN 1000 MCG/ML IJ SOLN
1000.0000 ug | Freq: Once | INTRAMUSCULAR | Status: AC
  Administered 2022-06-25: 1000 ug via INTRAMUSCULAR

## 2022-06-25 NOTE — Progress Notes (Signed)
Pt received B12 injection in left deltoid, pt tolerated well.

## 2022-06-25 NOTE — Progress Notes (Signed)
Liver tests have improved but still elevated.  Need to monitor. Will repeat at end of month

## 2022-06-27 LAB — ACETAMINOPHEN LEVEL: Acetaminophen (Tylenol), Serum: <10 mg/L — ABNORMAL LOW (ref 10–20)

## 2022-07-01 ENCOUNTER — Other Ambulatory Visit: Payer: Self-pay | Admitting: Family Medicine

## 2022-07-01 ENCOUNTER — Telehealth: Payer: Self-pay | Admitting: Family Medicine

## 2022-07-01 MED ORDER — LEVETIRACETAM 500 MG PO TABS: 500.0000 mg | ORAL_TABLET | Freq: Two times a day (BID) | ORAL | 0 refills | Status: DC

## 2022-07-01 NOTE — Telephone Encounter (Signed)
Please see message below and advise.

## 2022-07-01 NOTE — Telephone Encounter (Signed)
Patient requests the following RX that was originally prescribed at the [facility] for Seizures:   LAST APPOINTMENT DATE:  Please schedule appointment if longer than 1 year  06/17/22  NEXT APPOINTMENT DATE:  07/08/22  MEDICATION:  levETIRAcetam (KEPPRA) 500 MG tablet  Is the patient out of medication? Yes  PH[facility]ACY[facility]  [id number] [address] [place]: [phone] Fax: [phone]    Let patient know to contact pharmacy at the end of the day to make sure medication is ready.  Please notify patient to allow 48-72 hours to process

## 2022-07-05 ENCOUNTER — Other Ambulatory Visit: Payer: Self-pay | Admitting: Family Medicine

## 2022-07-08 ENCOUNTER — Ambulatory Visit (INDEPENDENT_AMBULATORY_CARE_PROVIDER_SITE_OTHER): Payer: Managed Care, Other (non HMO) | Admitting: Family Medicine

## 2022-07-08 ENCOUNTER — Encounter: Payer: Self-pay | Admitting: Family Medicine

## 2022-07-08 VITALS — BP 120/80 | HR 100 | Temp 98.8°F | Ht 67.0 in | Wt 202.2 lb

## 2022-07-08 DIAGNOSIS — R569 Unspecified convulsions: Secondary | ICD-10-CM

## 2022-07-08 DIAGNOSIS — R7989 Other specified abnormal findings of blood chemistry: Secondary | ICD-10-CM | POA: Diagnosis not present

## 2022-07-08 DIAGNOSIS — E538 Deficiency of other specified B group vitamins: Secondary | ICD-10-CM

## 2022-07-08 DIAGNOSIS — E876 Hypokalemia: Secondary | ICD-10-CM

## 2022-07-08 DIAGNOSIS — F4323 Adjustment disorder with mixed anxiety and depressed mood: Secondary | ICD-10-CM

## 2022-07-08 DIAGNOSIS — R9431 Abnormal electrocardiogram [ECG] [EKG]: Secondary | ICD-10-CM

## 2022-07-08 DIAGNOSIS — R112 Nausea with vomiting, unspecified: Secondary | ICD-10-CM

## 2022-07-08 DIAGNOSIS — R251 Tremor, unspecified: Secondary | ICD-10-CM

## 2022-07-08 LAB — CBC
HCT: 37.5 % (ref 36.0–46.0)
Hemoglobin: 12.6 g/dL (ref 12.0–15.0)
MCHC: 33.5 g/dL (ref 30.0–36.0)
MCV: 105.6 fl — ABNORMAL HIGH (ref 78.0–100.0)
Platelets: 359 10*3/uL (ref 150.0–400.0)
RBC: 3.55 Mil/uL — ABNORMAL LOW (ref 3.87–5.11)
RDW: 18.2 % — ABNORMAL HIGH (ref 11.5–15.5)
WBC: 11.1 10*3/uL — ABNORMAL HIGH (ref 4.0–10.5)

## 2022-07-08 LAB — COMPREHENSIVE METABOLIC PANEL
ALT: 262 U/L — ABNORMAL HIGH (ref 0–35)
AST: 151 U/L — ABNORMAL HIGH (ref 0–37)
Albumin: 4 g/dL (ref 3.5–5.2)
Alkaline Phosphatase: 40 U/L (ref 39–117)
BUN: 18 mg/dL (ref 6–23)
CO2: 25 mEq/L (ref 19–32)
Calcium: 9.4 mg/dL (ref 8.4–10.5)
Chloride: 102 mEq/L (ref 96–112)
Creatinine, Ser: 0.51 mg/dL (ref 0.40–1.20)
GFR: 116.31 mL/min (ref >60.00)
Glucose, Bld: 72 mg/dL (ref 70–99)
Potassium: 4.2 mEq/L (ref 3.5–5.1)
Sodium: 136 mEq/L (ref 135–145)
Total Bilirubin: 0.5 mg/dL (ref 0.2–1.2)
Total Protein: 6.8 g/dL (ref 6.0–8.3)

## 2022-07-08 LAB — TSH: TSH: 4.21 u[IU]/mL (ref 0.35–5.50)

## 2022-07-08 MED ORDER — SERTRALINE HCL 100 MG PO TABS: 100.0000 mg | ORAL_TABLET | Freq: Every day | ORAL | 1 refills | Status: DC

## 2022-07-08 MED ORDER — CYANOCOBALAMIN 1000 MCG/ML IJ SOLN
1000.0000 ug | Freq: Once | INTRAMUSCULAR | Status: AC
  Administered 2022-07-08: 1000 ug via INTRAMUSCULAR

## 2022-07-08 MED ORDER — PROMETHAZINE HCL 25 MG PO TABS: 25.0000 mg | ORAL_TABLET | Freq: Three times a day (TID) | ORAL | 0 refills | Status: DC | PRN

## 2022-07-08 MED ORDER — HYDROXYZINE HCL 10 MG PO TABS: 10.0000 mg | ORAL_TABLET | Freq: Four times a day (QID) | ORAL | 3 refills | Status: DC | PRN

## 2022-07-08 NOTE — Progress Notes (Signed)
Potassium - same dose.  Thyroid ok.  LFT's still elevated.  WBC still elevated-any fever, abd pain, cough?

## 2022-07-08 NOTE — Progress Notes (Signed)
Subjective:     Patient ID: Janice Arnold, female    DOB: 01/25/1981, 41 y.o.   MRN: [id number]  Chief Complaint  Patient presents with   Tremors    Pt is still having tremors whole body off and on.    HPI Tremors continue-whole body all day long-hands can shake and hard to write or do things or just at rest/sleep. Seeing neuro this wk.  Lorazepam helps some.  Can tell if not taking.  Takes it every 6 hrs.  Also on keppra for sz.    Nausea-zofran 4-5x/wk and if not work and vomiting, takes promethazine.  Still vomits once/wk.   Head pressure all the time.  Trying not to take tylenol as LFT's elevated.  Had appt w/neuro this wk.    Sleepy all the time.  B12 deficiency-here for injection  Health Maintenance Due  Topic Date Due   MAMMOGRAM  04/29/2020   COVID-19 Vaccine (3 - 2023-24 season) 04/12/2022    Past Medical History:  Diagnosis Date   Alcohol abuse    Anemia    Anxiety    Fatty liver due to alcoholism    History of laparoscopic appendectomy 03/02/2022   Menorrhagia    Vitamin deficiency     Past Surgical History:  Procedure Laterality Date   APPENDECTOMY  03/02/2022   LAPAROSCOPIC APPENDECTOMY N/A 03/02/2022   Procedure: APPENDECTOMY LAPAROSCOPIC;  Surgeon: Donnie Mesa, MD;  Location: [facility];  Service: General;  Laterality: N/A;    Outpatient Medications Prior to Visit  Medication Sig Dispense Refill   acetaminophen (TYLENOL) 500 MG tablet Take 1,000 mg by mouth 2 (two) times daily as needed (pain).     KLOR-CON M20 20 MEQ tablet Take 40 mEq by mouth 3 (three) times daily.     levETIRAcetam (KEPPRA) 500 MG tablet Take 1 tablet (500 mg total) by mouth 2 (two) times daily. 60 tablet 0   LORazepam (ATIVAN) 2 MG tablet Take 1 tablet (2 mg total) by mouth every 6 (six) hours as needed for anxiety. 120 tablet 0   LOW-OGESTREL 0.3-30 MG-MCG tablet Take 1 tablet by mouth daily.     melatonin 1 MG TABS tablet Take 1 mg by mouth at bedtime as needed (sleep).      norethindrone (AYGESTIN) 5 MG tablet Take 10 mg by mouth daily.     omeprazole (PRILOSEC) 40 MG capsule Take 1 capsule (40 mg total) by mouth daily. (Patient taking differently: Take 40 mg by mouth daily as needed (heartburn).) 90 capsule 1   ondansetron (ZOFRAN-ODT) 4 MG disintegrating tablet Take 1 tablet (4 mg total) by mouth every 8 (eight) hours as needed for nausea or vomiting. 60 tablet 3   Vitamin D, Ergocalciferol, (DRISDOL) 1.25 MG (50000 UNIT) CAPS capsule TAKE 1 CAPSULE (50,000 UNITS) BY MOUTH EVERY 7 DAYS (Patient taking differently: Take 50,000 Units by mouth every 7 (seven) days.) 12 capsule 0   escitalopram (LEXAPRO) 20 MG tablet Take 1 tablet (20 mg total) by mouth daily. 90 tablet 1   hydrOXYzine (ATARAX) 10 MG tablet TAKE 1 TABLET BY MOUTH 3 TIMES DAILY AS NEEDED FOR ITCHING. 45 tablet 3   No facility-administered medications prior to visit.    No Known Allergies ROS neg/noncontributory except as noted HPI/below      Objective:     BP 120/80 (BP Location: Left Arm, Patient Position: Sitting, Cuff Size: Normal)   Pulse 100   Temp 98.8 F (37.1 C) (Temporal)   Ht 5'  7" (1.702 m)   Wt 202 lb 4 oz (91.7 kg)   LMP 06/17/2022 (Approximate) Comment: has been on for last 8 weeks, stopped 2 weeks gao  SpO2 96%   BMI 31.68 kg/m  Wt Readings from Last 3 Encounters:  07/08/22 202 lb 4 oz (91.7 kg)  06/17/22 181 lb 4 oz (82.2 kg)  06/06/22 175 lb (79.4 kg)    Physical Exam   Gen: WDWN NAD HEENT: NCAT, conjunctiva not injected, sclera nonicteric NECK:  supple, no thyromegaly, no nodes, no carotid bruits CARDIAC: tachyRRR, S1S2+, no murmur. DP 2+B LUNGS: CTAB. No wheezes ABDOMEN:  BS+, soft, NTND, No HSM, no masses EXT:  no edema MSK: no gross abnormalities.  NEURO: A&O x3.  CN II-XII intact.  Tremors all over PSYCH: normal mood. Good eye contact     Assessment & Plan:   Problem List Items Addressed This Visit       Other   Adjustment disorder with mixed  anxiety and depressed mood   Hypokalemia   QT prolongation   Relevant Orders   Ambulatory referral to Cardiology   Abnormal LFTs (liver function tests)   Relevant Orders   Comprehensive metabolic panel   CBC   TSH   Seizure (HCC)   Other Visit Diagnoses     B12 deficiency    -  Primary   Relevant Medications   cyanocobalamin (VITAMIN B12) injection 1,000 mcg (Completed)   Nausea and vomiting, unspecified vomiting type       Tremor          Tremors-not sure of etiol-meds, electrolytes, other.  Seeing neuro soon.  For now, continue lorazepam '2mg'$  q6hrs but not long term unless neuro deems necessary.  Sz-?from wellbutrin, brain hematoma, electrolytes, other-on Keppra and lorazepam.  Will see neuro this wk.  B12 deficiency-on monthly injections-given today.  Continue Adjustment disorder-d/t concerns w/poss long QT-will change lexapro to zoloft '100mg'$  daily Poss long QT on EKG-?electrolyte disturbance, other.  Refer to Card as pt on mult meds. N/V-chronic.  At first thought d/t ETOH, but off that for 6 mo.  Electrolytes, other.  On zofran '4mg'$  most days.  Phenergan if vomits(aout once/wk). Elevated lft's-no longer on ETOH for 6 mo.  Was taking a lot of tylenol-has cut back.  Check lft's Electrolyte abn-check cmp, cbc.   F/u 1 mo  Meds ordered this encounter  Medications   cyanocobalamin (VITAMIN B12) injection 1,000 mcg   hydrOXYzine (ATARAX) 10 MG tablet    Sig: Take 1 tablet (10 mg total) by mouth every 6 (six) hours as needed.    Dispense:  120 tablet    Refill:  3   promethazine (PHENERGAN) 25 MG tablet    Sig: Take 1 tablet (25 mg total) by mouth every 8 (eight) hours as needed for nausea or vomiting.    Dispense:  20 tablet    Refill:  0   sertraline (ZOLOFT) 100 MG tablet    Sig: Take 1 tablet (100 mg total) by mouth daily.    Dispense:  90 tablet    Refill:  1    Wellington Hampshire, MD

## 2022-07-08 NOTE — Therapy (Unsigned)
OUTPATIENT OCCUPATIONAL THERAPY NEURO EVALUATION  Patient Name: ARSHIA SPELLMAN MRN: [id number] DOB:1980/08/26, 41 y.o., female Today's Date: 07/09/2022  PCP: Dr. Cherlynn Kaiser REFERRING PROVIDER: Dr. Cathlean Sauer  END OF SESSION:  OT End of Session - 07/09/22 1246     Visit Number 1    Number of Visits 9    Date for OT Re-Evaluation 09/03/22    Authorization Type [employer]    Authorization Time Period 4 weeks    OT Start Time 1106    OT Stop Time 1145    OT Time Calculation (min) 39 min    Behavior During Therapy [facility] for tasks assessed/performed;Flat affect             Past Medical History:  Diagnosis Date   Alcohol abuse    Anemia    Anxiety    Fatty liver due to alcoholism    History of laparoscopic appendectomy 03/02/2022   Menorrhagia    Vitamin deficiency    Past Surgical History:  Procedure Laterality Date   APPENDECTOMY  03/02/2022   LAPAROSCOPIC APPENDECTOMY N/A 03/02/2022   Procedure: APPENDECTOMY LAPAROSCOPIC;  Surgeon: Donnie Mesa, MD;  Location: [facility];  Service: General;  Laterality: N/A;   Patient Active Problem List   Diagnosis Date Noted   Depression 06/09/2022   Seizure ([place]) 06/06/2022   Alcohol dependence ([place]) 06/06/2022   Intractable nausea and vomiting 05/29/2022   Hypokalemia 05/29/2022   Hypomagnesemia 05/29/2022   QT prolongation 05/29/2022   AKI (acute kidney injury) ([place]) 05/29/2022   Hypochloremia 05/29/2022   Vaginal bleeding 05/29/2022   HTN (hypertension) 05/29/2022   GERD (gastroesophageal reflux disease) 05/29/2022   Abnormal LFTs (liver function tests) 05/29/2022   Local edema 05/09/2022   Adjustment disorder with mixed anxiety and depressed mood 05/09/2022   Iron deficiency anemia due to chronic blood loss 02/11/2022   Vitamin D deficiency 10/07/2019   Transaminitis 10/07/2019   IUD (intrauterine device) in place 10/06/2019    ONSET DATE: 06/06/22  REFERRING DIAG:  Diagnosis  R56.9 (ICD-10-CM) - Seizure-like activity ([place])     THERAPY DIAG:  Other lack of coordination - Plan: Ot plan of care cert/re-cert  Muscle weakness (generalized) - Plan: Ot plan of care cert/re-cert  Other abnormalities of gait and mobility - Plan: Ot plan of care cert/re-cert  Unsteadiness on feet - Plan: Ot plan of care cert/re-cert  Frontal lobe and executive function deficit - Plan: Ot plan of care cert/re-cert  Attention and concentration deficit - Plan: Ot plan of care cert/re-cert  Visuospatial deficit - Plan: Ot plan of care cert/re-cert  Rationale for Evaluation and Treatment: Rehabilitation  SUBJECTIVE:   SUBJECTIVE STATEMENT: 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 Pt with post-event confusion. 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 PMH significant for alcohol abuse, fatty liver, menorrhagia, vitamin deficiency, anemia, and appendectomy. 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 No  FALLS: Has patient fallen in last 6 months? No  LIVING ENVIRONMENT: Lives with: lives with their spouse  Lives in: House/apartment Stairs: yes Has following equipment at home: shower chair  PLOF: Independent  PATIENT GOALS: To be completely independent OBJECTIVE:   HAND DOMINANCE: Right  ADLs: Overall ADLs: increased time, fall risk  Transfers/ambulation related to ADLs: Eating: independent Grooming: mod I UB Dressing: mod I LB Dressing: mod I Toileting: mod I Bathing: mod I Tub Shower transfers: supervision,  recommend use of walk in shower, has 3 in 1 commode Equipment:  3 in 1 commode  IADLs: Shopping: dependent Light housekeeping: dependent Meal Prep: dependent Community mobility: mod I Medication management: pt handles, difficulty with opening containers Financial management: pt's husband is assisting Handwriting: 100% legible, increased time and pt reports that it is not at prior level  MOBILITY STATUS:  mod I   ACTIVITY TOLERANCE: Activity tolerance: 10-15 mins in standing prior to rest break   UPPER EXTREMITY ROM:  Bilateral UE's WFLS  Active ROM Right eval Left eval  Shoulder flexion 130 130  Shoulder abduction    Shoulder adduction    Shoulder extension    Shoulder internal rotation    Shoulder external rotation    Elbow flexion    Elbow extension    Wrist flexion    Wrist extension    Wrist ulnar deviation    Wrist radial deviation    Wrist pronation    Wrist supination    (Blank rows = not tested)  UPPER EXTREMITY MMT:     MMT Right eval Left eval  Shoulder flexion 3+/5 4/5  Shoulder abduction    Shoulder adduction    Shoulder extension    Shoulder internal rotation    Shoulder external rotation    Middle trapezius    Lower trapezius    Elbow flexion 4/5 4/5  Elbow extension 4/5 4/5  Wrist flexion    Wrist extension    Wrist ulnar deviation    Wrist radial deviation    Wrist pronation    Wrist supination    (Blank rows = not tested)  HAND FUNCTION: Grip strength: Right: 39.6 lbs; Left: 40.7 lbs  COORDINATION: 9 Hole Peg test: Right: 28.82 sec; Left: 33.38 sec Tremors: Resting and ataxia  SENSATION: Light touch: Impaired   COGNITION: Overall cognitive status: decreased attention, word finding difficulties, decreased ability to organize/ sequence, cogntiion to be further addressed with in a functional context,  VISION: Subjective report: Pt reports retinal detachment with seizure Baseline vision:  new visual deficits Visual history:  retinal detachment, blurry vision  VISION ASSESSMENT: To be further assessed in functional context  Patient has difficulty with following activities due to following visual impairments: sometimes reading is difficult. She bumps into itmes on right side     TODAY'S TREATMENT:                                                                                                                              DATE: 07/09/22   PATIENT EDUCATION: Education details: role of OT  and potential goals Person educated: Patient and Spouse Education method: Explanation Education comprehension: verbalized understanding  HOME EXERCISE PROGRAM: N/a   GOALS: Goals reviewed with patient? Yes potential goals discussed with pt/ spouse    LONG TERM GOALS: Target date: 08/03/22   I with HEP for coordination, grip and proximal strength(low range theraband) Baseline:  Goal status: INITIAL  2.  Pt will verbalize understanding of compensations for visual deficits. Baseline:  Goal status: INITIAL  3.  Pt will perform basic home management/ cooking with supervision demonstrating good safety awareness.. Baseline:  Goal status: INITIAL  4.  Pt will perform environmental scanning in a busy environment with 90% accuracy. Baseline:  Goal status: INITIAL  5.  Pt will demonstrate improved activity tolerance as evidenced by standing for functional/ IADL tasks for 20 mins or greater prior to rest break. Baseline:  Goal status: INITIAL  6.  Pt will verbalize understanding of compensatory strategies for tremors Baseline:  Goal status: INITIAL  ASSESSMENT:  CLINICAL IMPRESSION: Patient is a 41 y.o. female who was seen today for occupational therapy evaluation for seizure like activity. Pt presented to [facility] ED following a tonic clonic seizure lasting one minute as well as an additional seizure at home and one in the ED 06/06/22. Pt with post-event confusion. CT revealing R cerebral convexity chronic subdural  hematoma with leftward midline shift. PMH significant for alcohol abuse, fatty liver, menorrhagia, vitamin deficiency, anemia, and appendectomy. 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  Marland Kitchen   PERFORMANCE DEFICITS: in functional skills including ADLs, IADLs, coordination, dexterity, sensation, ROM, strength, flexibility, Fine motor control, Gross motor control, mobility, balance, endurance, decreased knowledge of precautions, decreased knowledge of use of DME, vision, and UE functional use, cognitive skills including attention, learn, memory, problem solving, safety awareness, sequencing, thought, and understand, and psychosocial skills including coping strategies, environmental adaptation, habits, interpersonal interactions, and routines and behaviors.   IMPAIRMENTS: are limiting patient from ADLs, IADLs, work, leisure, and social participation.   CO-MORBIDITIES: may have co-morbidities  that affects occupational performance. Patient will benefit from skilled OT to address above impairments and improve overall function.  MODIFICATION OR ASSISTANCE TO COMPLETE EVALUATION: No modification of tasks or assist necessary to complete an evaluation.  OT OCCUPATIONAL PROFILE AND HISTORY: Problem focused assessment: Including review of records relating to presenting problem.  CLINICAL DECISION MAKING: LOW - limited treatment options, no task modification necessary  REHAB POTENTIAL: Good  EVALUATION COMPLEXITY: Low    PLAN:  OT FREQUENCY: 2x/week  OT DURATION: 4 weeks plus eval  PLANNED INTERVENTIONS: self care/ADL training, therapeutic exercise, therapeutic activity, neuromuscular re-education, manual therapy, passive range of motion, balance training, paraffin, fluidotherapy, moist heat, cryotherapy, contrast bath, patient/family education, cognitive  remediation/compensation, visual/perceptual remediation/compensation, energy conservation, coping strategies training, DME and/or AE instructions, and Re-evaluation  RECOMMENDED OTHER SERVICES: ST for cognition/ word finding  CONSULTED AND AGREED WITH PLAN OF CARE: Patient and family member/caregiver  PLAN FOR NEXT SESSION: coordination HEP, check environmental scanning, visual compensation strategies   Adiyah Lame, OT 07/09/2022, 1:08 PM

## 2022-07-08 NOTE — Patient Instructions (Addendum)
It was very nice to see you today!  Change lexapro(escitalopram) to sertraline  Referring to Cardiology  Talk to neuro about tremors, seizures, brain stuff  Happy Holidays!   PLEASE NOTE:  If you had any lab tests please let us know if you have not heard back within a few days. You may see your results on MyChart before we have a chance to review them but we will give you a call once they are reviewed by [place]. If we ordered any referrals today, please let us know if you have not heard from their office within the next week.   Please try these tips to maintain a healthy lifestyle:  Eat most of your calories during the day when you are active. Eliminate processed foods including packaged sweets (pies, cakes, cookies), reduce intake of potatoes, white bread, white pasta, and white rice. Look for whole grain options, oat flour or almond flour.  Each meal should contain half fruits/vegetables, one quarter protein, and one quarter carbs (no bigger than a computer mouse).  Cut down on sweet beverages. This includes juice, soda, and sweet tea. Also watch fruit intake, though this is a healthier sweet option, it still contains natural sugar! Limit to 3 servings daily.  Drink at least 1 glass of water with each meal and aim for at least 8 glasses per day  Exercise at least 150 minutes every week.

## 2022-07-09 ENCOUNTER — Telehealth: Payer: Self-pay | Admitting: Physical Therapy

## 2022-07-09 ENCOUNTER — Ambulatory Visit: Payer: Managed Care, Other (non HMO) | Admitting: Occupational Therapy

## 2022-07-09 ENCOUNTER — Ambulatory Visit: Payer: Managed Care, Other (non HMO) | Attending: Internal Medicine | Admitting: Physical Therapy

## 2022-07-09 VITALS — BP 120/86

## 2022-07-09 DIAGNOSIS — R2681 Unsteadiness on feet: Secondary | ICD-10-CM

## 2022-07-09 DIAGNOSIS — R41844 Frontal lobe and executive function deficit: Secondary | ICD-10-CM | POA: Insufficient documentation

## 2022-07-09 DIAGNOSIS — R41842 Visuospatial deficit: Secondary | ICD-10-CM

## 2022-07-09 DIAGNOSIS — R278 Other lack of coordination: Secondary | ICD-10-CM

## 2022-07-09 DIAGNOSIS — R41840 Attention and concentration deficit: Secondary | ICD-10-CM

## 2022-07-09 DIAGNOSIS — R2689 Other abnormalities of gait and mobility: Secondary | ICD-10-CM

## 2022-07-09 DIAGNOSIS — R569 Unspecified convulsions: Secondary | ICD-10-CM | POA: Diagnosis not present

## 2022-07-09 DIAGNOSIS — M6281 Muscle weakness (generalized): Secondary | ICD-10-CM

## 2022-07-09 NOTE — Telephone Encounter (Signed)
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  The patient would benefit from a Speech Therapy evaluation for difficulty with problem-solving and some word-finding difficulties.   If you agree, please place an order in [facility] workque in [facility] or fax the order to [phone]. Thank you, Excell Seltzer, PT, DPT, [facility] [address] [place], [place]  [address] Phone:  [phone] Fax:  [phone]

## 2022-07-09 NOTE — Therapy (Signed)
OUTPATIENT PHYSICAL THERAPY NEURO EVALUATION   Patient Name: Janice Arnold MRN: [id number] DOB:September 18, 1980, 41 y.o., female Today's Date: 07/09/2022   PCP: Tawnya Crook, MD REFERRING PROVIDER: Tawni Millers, MD  END OF SESSION:  PT End of Session - 07/09/22 1022     Visit Number 1    Number of Visits 9   with eval   Date for PT Re-Evaluation 08/20/22   to allow for scheduling conflicts   Authorization Type [employer]    PT Start Time 1020   pt late   PT Stop Time 1100    PT Time Calculation (min) 40 min    Equipment Utilized During Treatment Gait belt    Activity Tolerance Patient tolerated treatment well    Behavior During Therapy [facility] for tasks assessed/performed;Flat affect             Past Medical History:  Diagnosis Date   Alcohol abuse    Anemia    Anxiety    Fatty liver due to alcoholism    History of laparoscopic appendectomy 03/02/2022   Menorrhagia    Vitamin deficiency    Past Surgical History:  Procedure Laterality Date   APPENDECTOMY  03/02/2022   LAPAROSCOPIC APPENDECTOMY N/A 03/02/2022   Procedure: APPENDECTOMY LAPAROSCOPIC;  Surgeon: Donnie Mesa, MD;  Location: [facility];  Service: General;  Laterality: N/A;   Patient Active Problem List   Diagnosis Date Noted   Depression 06/09/2022   Seizure ([place]) 06/06/2022   Alcohol dependence ([place]) 06/06/2022   Intractable nausea and vomiting 05/29/2022   Hypokalemia 05/29/2022   Hypomagnesemia 05/29/2022   QT prolongation 05/29/2022   AKI (acute kidney injury) ([place]) 05/29/2022   Hypochloremia 05/29/2022   Vaginal bleeding 05/29/2022   HTN (hypertension) 05/29/2022   GERD (gastroesophageal reflux disease) 05/29/2022   Abnormal LFTs (liver function tests) 05/29/2022   Local edema 05/09/2022   Adjustment disorder with mixed anxiety and depressed mood 05/09/2022   Iron deficiency anemia due to chronic blood loss 02/11/2022   Vitamin D deficiency 10/07/2019   Transaminitis 10/07/2019   IUD  (intrauterine device) in place 10/06/2019    ONSET DATE: 06/09/2022  REFERRING DIAG: R56.9 (ICD-10-CM) - Seizure-like activity ([place])  THERAPY DIAG:  Muscle weakness (generalized)  Other abnormalities of gait and mobility  Unsteadiness on feet  Rationale for Evaluation and Treatment: Rehabilitation  SUBJECTIVE:                                                                                                                                                                                             SUBJECTIVE STATEMENT: Pt reports onset of several medical  issues over the past 2 years including frequent emesis, severe anemia, liver damage, B12 deficiency, trouble with her kidneys and her heart, and new onset of seizures in the past month. Pt was hospitalized for her seizures and is now taking new medications to control them. Pt reports she felt that she had weakness over the past few years but since her hospital stay she feels like she doesn't have a lot of energy. Pt reports she cannot stand for very long before getting fatigued and gets shaky/dizzy, uses a shower seat while bathing.  Pt accompanied by: significant other (husband)  PERTINENT HISTORY: B12 deficiency, QT prolongation, Abnormal LFTs (liver function tests), Hypokalemia, Adjustment disorder with mixed anxiety and depressed mood, Seizure, Nausea and vomiting, unspecified vomiting type, Tremor  PAIN:  Are you having pain? No  PRECAUTIONS: Fall  WEIGHT BEARING RESTRICTIONS: No  FALLS: Has patient fallen in last 6 months? No  LIVING ENVIRONMENT: Lives with: lives with their family (husband, kids) Lives in: House/apartment Stairs: Yes: Internal: 12 steps; on right going up Has following equipment at home: shower chair  PLOF: Independent with gait, Independent with transfers, and was not able to do weekly grocery shopping but could run into the store for an item; now can't stand longer than 5 min  PATIENT GOALS: "be back  to myself" "be able to stand up"; was working from home but is on leave right now  OBJECTIVE:   DIAGNOSTIC FINDINGS: None relevant to this treatment plan  COGNITION: Overall cognitive status:  appears WFL, pt reports some "foggines" and trouble with thinking and word-finding difficulties   SENSATION: Occasionally has N/T in fingers and LE  COORDINATION: WFL in BLE  POSTURE: rounded shoulders and forward head   LOWER EXTREMITY MMT:    MMT Right Eval Left Eval  Hip flexion 4 4  Hip extension    Hip abduction    Hip adduction    Hip internal rotation    Hip external rotation    Knee flexion 4 4  Knee extension 4 4  Ankle dorsiflexion 5 5  Ankle plantarflexion    Ankle inversion    Ankle eversion    (Blank rows = not tested)  BED MOBILITY:  Independent per pt report  TRANSFERS: Assistive device utilized: None  Sit to stand: Modified independence *increased time needed Stand to sit: Modified independence Chair to chair: Modified independence Floor:  not assessed at eval  STAIRS: Level of Assistance: Modified independence Stair Negotiation Technique: Alternating Pattern  with Single Rail on Right Number of Stairs: 4  Height of Stairs: 6  Comments: WFL, decreased speed  GAIT: Gait pattern: WFL Distance walked: 810 ft Assistive device utilized: None Level of assistance: Modified independence Comments: decreased gait speed, occasional shuffling of feet with onset of fatigue  FUNCTIONAL TESTS:    [facility] PT Assessment - 07/09/22 1032       Ambulation/Gait   Gait velocity 32.8 ft over 13.78 sec = 2.38 ft/sec      6 minute walk test results    Aerobic Endurance Distance Walked 810    Endurance additional comments RPE 7/10; BP 139/96      Standardized Balance Assessment   Standardized Balance Assessment Timed Up and Go Test;Five Times Sit to Stand    Five times sit to stand comments  18.38 with UE on arms of chair      Timed Up and Go Test   TUG Normal  TUG    Normal TUG (seconds) 13.65  no AD     Functional Gait  Assessment   Gait assessed  Yes    Gait Level Surface Walks 20 ft, slow speed, abnormal gait pattern, evidence for imbalance or deviates 10-15 in outside of the 12 in walkway width. Requires more than 7 sec to ambulate 20 ft.    Change in Gait Speed Makes only minor adjustments to walking speed, or accomplishes a change in speed with significant gait deviations, deviates 10-15 in outside the 12 in walkway width, or changes speed but loses balance but is able to recover and continue walking.    Gait with Horizontal Head Turns Performs head turns smoothly with slight change in gait velocity (eg, minor disruption to smooth gait path), deviates 6-10 in outside 12 in walkway width, or uses an assistive device.    Gait with Vertical Head Turns Performs task with slight change in gait velocity (eg, minor disruption to smooth gait path), deviates 6 - 10 in outside 12 in walkway width or uses assistive device    Gait and Pivot Turn Pivot turns safely in greater than 3 sec and stops with no loss of balance, or pivot turns safely within 3 sec and stops with mild imbalance, requires small steps to catch balance.    Step Over Obstacle Is able to step over one shoe box (4.5 in total height) but must slow down and adjust steps to clear box safely. May require verbal cueing.    Gait with Narrow Base of Support Ambulates 4-7 steps.    Gait with Eyes Closed Walks 20 ft, slow speed, abnormal gait pattern, evidence for imbalance, deviates 10-15 in outside 12 in walkway width. Requires more than 9 sec to ambulate 20 ft.    Ambulating Backwards Walks 20 ft, slow speed, abnormal gait pattern, evidence for imbalance, deviates 10-15 in outside 12 in walkway width.    Steps Alternating feet, must use rail.    Total Score 14    FGA comment: 14/30, high fall risk              TODAY'S TREATMENT:                                                                                                                                PT Evaluation  Vitals:   07/09/22 1127  BP: 120/86    PATIENT EDUCATION: Education details: Eval findings, PT POC Person educated: Patient and Spouse Education method: Explanation and Demonstration Education comprehension: verbalized understanding and needs further education  HOME EXERCISE PROGRAM: To be established next session   GOALS: Goals reviewed with patient? Yes  SHORT TERM GOALS=LONG TERM GOALS due to length of POC   LONG TERM GOALS: Target date: 08/09/2022  Pt will be independent with final HEP for improved strength, balance, transfers and gait. Baseline:  Goal status: INITIAL  2.  Pt will improve gait velocity to at least 3.0 ft/sec for improved gait efficiency and performance  at mod I level  Baseline: 2.38 ft/sec (11/28) Goal status: INITIAL  3.  Pt will improve FGA to 20/30 for decreased fall risk  Baseline: 14/30 (11/28) Goal status: INITIAL  4.  Pt will ambulate greater than or equal to 1000 feet on 6MWT with LRAD and mod I for improved cardiovascular endurance and BLE strength.  Baseline: 80 ft with no AD, mod I (11/28) Goal status: INITIAL  5.  Pt will be able to tolerate standing x 10 min while performing functional task to demonstrate improved ability to complete functional activities Baseline: 6 minutes (11/28) Goal status: INITIAL   ASSESSMENT:  CLINICAL IMPRESSION: Patient is a 41 year old female referred to Neuro OPPT for seizures.   Pt's PMH is significant for: B12 deficiency, QT prolongation, Abnormal LFTs (liver function tests), Hypokalemia, Adjustment disorder with mixed anxiety and depressed mood, Seizure, Nausea and vomiting, unspecified vomiting type, Tremor. The following deficits were present during the exam: decreased functional LE strength, decreased gait speed, decreased balance, increased fall risk, decreased endurance. Based on her gait speed of 2.38 ft/sec, 5xSTS  score of 18.38 sec, and FGA score of 14/30, pt is an increased risk for falls. Pt would benefit from skilled PT to address these impairments and functional limitations to maximize functional mobility independence.   OBJECTIVE IMPAIRMENTS: cardiopulmonary status limiting activity, decreased activity tolerance, decreased balance, decreased cognition, decreased endurance, decreased knowledge of condition, decreased strength, dizziness, impaired perceived functional ability, and impaired sensation.   ACTIVITY LIMITATIONS: carrying, lifting, bending, standing, squatting, stairs, bathing, and hygiene/grooming  PARTICIPATION LIMITATIONS: meal prep, cleaning, laundry, interpersonal relationship, driving, shopping, community activity, and occupation  PERSONAL FACTORS: B12 deficiency, QT prolongation, Abnormal LFTs (liver function tests), Hypokalemia, Adjustment disorder with mixed anxiety and depressed mood, Seizure, Nausea and vomiting, unspecified vomiting type, Tremor are also affecting patient's functional outcome.   REHAB POTENTIAL: Good  CLINICAL DECISION MAKING: Stable/uncomplicated  EVALUATION COMPLEXITY: Low  PLAN:  PT FREQUENCY: 2x/week  PT DURATION:  9 total sessions (with eval)  PLANNED INTERVENTIONS: Therapeutic exercises, Therapeutic activity, Neuromuscular re-education, Balance training, Gait training, Patient/Family education, Self Care, Joint mobilization, Stair training, Vestibular training, Canalith repositioning, Visual/preceptual remediation/compensation, DME instructions, Dry Needling, Cognitive remediation, Cryotherapy, Moist heat, Manual therapy, and Re-evaluation  PLAN FOR NEXT SESSION: initiate HEP for global strengthening and endurance (sit to stands, squats, walking program), assess vestibular system?   Excell Seltzer, PT, DPT, [employer] 07/09/2022, 11:27 AM

## 2022-07-11 ENCOUNTER — Encounter: Payer: Self-pay | Admitting: Occupational Therapy

## 2022-07-11 ENCOUNTER — Ambulatory Visit (INDEPENDENT_AMBULATORY_CARE_PROVIDER_SITE_OTHER): Payer: Managed Care, Other (non HMO) | Admitting: Neurology

## 2022-07-11 ENCOUNTER — Ambulatory Visit: Payer: Managed Care, Other (non HMO) | Admitting: Physical Therapy

## 2022-07-11 ENCOUNTER — Ambulatory Visit: Payer: Managed Care, Other (non HMO) | Admitting: Occupational Therapy

## 2022-07-11 ENCOUNTER — Telehealth: Payer: Self-pay | Admitting: Neurology

## 2022-07-11 ENCOUNTER — Encounter: Payer: Self-pay | Admitting: Neurology

## 2022-07-11 ENCOUNTER — Encounter: Payer: Self-pay | Admitting: Family Medicine

## 2022-07-11 VITALS — BP 121/83 | HR 93 | Ht 67.0 in | Wt 204.5 lb

## 2022-07-11 DIAGNOSIS — R278 Other lack of coordination: Secondary | ICD-10-CM

## 2022-07-11 DIAGNOSIS — R7989 Other specified abnormal findings of blood chemistry: Secondary | ICD-10-CM

## 2022-07-11 DIAGNOSIS — S065XAA Traumatic subdural hemorrhage with loss of consciousness status unknown, initial encounter: Secondary | ICD-10-CM | POA: Diagnosis not present

## 2022-07-11 DIAGNOSIS — R2681 Unsteadiness on feet: Secondary | ICD-10-CM

## 2022-07-11 DIAGNOSIS — M6281 Muscle weakness (generalized): Secondary | ICD-10-CM

## 2022-07-11 DIAGNOSIS — R2689 Other abnormalities of gait and mobility: Secondary | ICD-10-CM

## 2022-07-11 DIAGNOSIS — R569 Unspecified convulsions: Secondary | ICD-10-CM | POA: Diagnosis not present

## 2022-07-11 DIAGNOSIS — R41844 Frontal lobe and executive function deficit: Secondary | ICD-10-CM

## 2022-07-11 DIAGNOSIS — R41840 Attention and concentration deficit: Secondary | ICD-10-CM

## 2022-07-11 DIAGNOSIS — R41842 Visuospatial deficit: Secondary | ICD-10-CM

## 2022-07-11 NOTE — Patient Instructions (Signed)
Discontinue Keppra Repeat CT head first week of January 2024 Continue your other medications Follow-up with primary care doctor, consider referral to counseling for management of stress anxiety Return as needed

## 2022-07-11 NOTE — Progress Notes (Signed)
[facility]  PATIENT: Janice Arnold DOB: [birth year]  REQUESTING CLINICIAN: Derek Jack, MD HISTORY FROM: Patient and husband  REASON FOR VISIT: Seizure    HISTORICAL  CHIEF COMPLAINT:  Chief Complaint  Patient presents with   Hospitalization Follow-up    Rm 12. Accompanied by husband, Darnelle Maffucci. NP internal referral for Seizure.    HISTORY OF PRESENT ILLNESS:  This is a 41 year old woman past medical history of alcohol abuse, fatty liver, anxiety who is presenting after being admitted to the hospital on October 26 for seizures.  Per husband, it was early in the morning when he noted that patient leg was very stiff, and she was unresponsive.  When he tried to wake her up he noted that her eyes were rolled back, she was making grunting noise and had a generalized tonic-clonic event.  EMS was called.  He reported the seizure lasted about a minute but patient was confused for about 30 minutes.  Husband reported in the ED she has a second seizure.  Per patient, she does have a history of alcohol abuse but her last drink was in June.  She reports stopping alcohol cold [place] and did not have any withdrawal symptoms.  She reported that night of the seizure she went out with her husband and has a had a couple of seltzer drinks, very little alcohol.  She denies and she was adamant that she is not drinking 1/5 of liquor every day.  She denies any previous history of seizures, denies any history of fall or head trauma in the weeks prior to the seizure. she reports bumping her head 3 years ago but nothing recent. Since leaving the hospital she feels a little anxious and jittery.  She is on Ativan but feels like her tremors are still present.  She reported she has not had any additional drink since the 26.  Denies any seizure or seizure-like activity. Feels like she has brain fog and mild dizziness. She is compliant with the Keppra 500 mg BID.    Handedness: Right handed    Onset: Oct 26  Seizure Type: Generalized   Current frequency: Only once   Any injuries from seizures: Denies   Seizure risk factors: Alcohol use, Subdural hematoma   Previous ASMs: None   Currenty ASMs: Levetiracetam   ASMs side effects: Denies   Brain Images: Subdural hematoma   Previous EEGs: Normal    OTHER MEDICAL CONDITIONS: History of alcohol abuse, anxiety,   REVIEW OF SYSTEMS: Full 14 system review of systems performed and negative with exception of: As noted in the HPI   ALLERGIES: No Known Allergies  HOME MEDICATIONS: Outpatient Medications Prior to Visit  Medication Sig Dispense Refill   hydrOXYzine (ATARAX) 10 MG tablet Take 1 tablet (10 mg total) by mouth every 6 (six) hours as needed. 120 tablet 3   KLOR-CON M20 20 MEQ tablet Take 40 mEq by mouth 3 (three) times daily.     LORazepam (ATIVAN) 2 MG tablet Take 1 tablet (2 mg total) by mouth every 6 (six) hours as needed for anxiety. 120 tablet 0   LOW-OGESTREL 0.3-30 MG-MCG tablet Take 1 tablet by mouth daily.     melatonin 1 MG TABS tablet Take 1 mg by mouth at bedtime as needed (sleep).     norethindrone (AYGESTIN) 5 MG tablet Take 10 mg by mouth daily.     omeprazole (PRILOSEC) 40 MG capsule Take 1 capsule (40 mg total) by mouth daily. (Patient taking differently:  Take 40 mg by mouth daily as needed (heartburn).) 90 capsule 1   ondansetron (ZOFRAN-ODT) 4 MG disintegrating tablet Take 1 tablet (4 mg total) by mouth every 8 (eight) hours as needed for nausea or vomiting. 60 tablet 3   promethazine (PHENERGAN) 25 MG tablet Take 1 tablet (25 mg total) by mouth every 8 (eight) hours as needed for nausea or vomiting. 20 tablet 0   sertraline (ZOLOFT) 100 MG tablet Take 1 tablet (100 mg total) by mouth daily. 90 tablet 1   Vitamin D, Ergocalciferol, (DRISDOL) 1.25 MG (50000 UNIT) CAPS capsule TAKE 1 CAPSULE (50,000 UNITS) BY MOUTH EVERY 7 DAYS (Patient taking differently: Take 50,000 Units by mouth every 7 (seven)  days.) 12 capsule 0   levETIRAcetam (KEPPRA) 500 MG tablet Take 1 tablet (500 mg total) by mouth 2 (two) times daily. 60 tablet 0   acetaminophen (TYLENOL) 500 MG tablet Take 1,000 mg by mouth 2 (two) times daily as needed (pain).     No facility-administered medications prior to visit.    PAST MEDICAL HISTORY: Past Medical History:  Diagnosis Date   Alcohol abuse    Anemia    Anxiety    Fatty liver due to alcoholism    History of laparoscopic appendectomy 03/02/2022   Menorrhagia    Vitamin deficiency     PAST SURGICAL HISTORY: Past Surgical History:  Procedure Laterality Date   APPENDECTOMY  03/02/2022   LAPAROSCOPIC APPENDECTOMY N/A 03/02/2022   Procedure: APPENDECTOMY LAPAROSCOPIC;  Surgeon: Donnie Mesa, MD;  Location: [facility];  Service: General;  Laterality: N/A;    FAMILY HISTORY: Family History  Problem Relation Age of Onset   Breast cancer Mother    Cervical cancer Mother        ovarian   Bone cancer Mother    Breast cancer Maternal Grandmother    Colon cancer Maternal Great-grandmother 57   Esophageal cancer Neg Hx    Stomach cancer Neg Hx    Seizures Neg Hx    Stroke Neg Hx     SOCIAL HISTORY: Social History   Socioeconomic History   Marital status: Married    Spouse name: Not on file   Number of children: 3   Years of education: Not on file   Highest education level: Not on file  Occupational History   Occupation: Government social research officer  Tobacco Use   Smoking status: Never   Smokeless tobacco: Never  Vaping Use   Vaping Use: Never used  Substance and Sexual Activity   Alcohol use: Not Currently    Alcohol/week: 21.0 standard drinks of alcohol    Types: 21 Glasses of wine per week    Comment: cut back and then started drinking a littl emore, currently 2 seltzers/day; h/o heavy use with 1/2 fifth daily   Drug use: Never   Sexual activity: Yes  Other Topics Concern   Not on file  Social History Narrative   Project mgr   Social Determinants of  Health   Financial Resource Strain: Not on file  Food Insecurity: No Food Insecurity (06/07/2022)   Hunger Vital Sign    Worried About Running Out of Food in the Last Year: Never true    Ran Out of Food in the Last Year: Never true  Transportation Needs: No Transportation Needs (06/07/2022)   PRAPARE - Hydrologist (Medical): No    Lack of Transportation (Non-Medical): No  Physical Activity: Not on file  Stress: Not on file  Social  Connections: Not on file  Intimate Partner Violence: Not At Risk (06/07/2022)   Humiliation, Afraid, Rape, and Kick questionnaire    Fear of Current or Ex-Partner: No    Emotionally Abused: No    Physically Abused: No    Sexually Abused: No    PHYSICAL EXAM  GENERAL EXAM/CONSTITUTIONAL: Vitals:  Vitals:   07/11/22 0757  BP: 121/83  Pulse: 93  Weight: 204 lb 8 oz (92.8 kg)  Height: '5\' 7"'$  (1.702 m)   Body mass index is 32.03 kg/m. Wt Readings from Last 3 Encounters:  07/11/22 204 lb 8 oz (92.8 kg)  07/08/22 202 lb 4 oz (91.7 kg)  06/17/22 181 lb 4 oz (82.2 kg)   Patient is in no distress; well developed, nourished and groomed; neck is supple. She appears anxious and tremolous  EYES: Visual fields full to confrontation, Extraocular movements intacts,  No results found.  MUSCULOSKELETAL: Gait, strength, tone, movements noted in Neurologic exam below  NEUROLOGIC: MENTAL STATUS:      No data to display         awake, alert, oriented to person, place and time recent and remote memory intact normal attention and concentration language fluent, comprehension intact, naming intact fund of knowledge appropriate  CRANIAL NERVE:  2nd, 3rd, 4th, 6th - Visual fields full to confrontation, extraocular muscles intact, no nystagmus 5th - facial sensation symmetric 7th - facial strength symmetric 8th - hearing intact 9th - palate elevates symmetrically, uvula midline 11th - shoulder shrug symmetric 12th - tongue  protrusion midline  MOTOR:  normal bulk and tone, full strength in the BUE, BLE. Mild tremors noted on exam   SENSORY:  normal and symmetric to light touch  COORDINATION:  finger-nose-finger, fine finger movements normal  REFLEXES:  deep tendon reflexes present and symmetric  GAIT/STATION:  normal    DIAGNOSTIC DATA (LABS, IMAGING, TESTING) - I reviewed patient records, labs, notes, testing and imaging myself where available.  Lab Results  Component Value Date   WBC 11.1 (H) 07/08/2022   HGB 12.6 07/08/2022   HCT 37.5 07/08/2022   MCV 105.6 (H) 07/08/2022   PLT 359.0 07/08/2022      Component Value Date/Time   NA 136 07/08/2022 1133   K 4.2 07/08/2022 1133   CL 102 07/08/2022 1133   CO2 25 07/08/2022 1133   GLUCOSE 72 07/08/2022 1133   BUN 18 07/08/2022 1133   CREATININE 0.51 07/08/2022 1133   CREATININE 0.51 02/11/2022 1103   CALCIUM 9.4 07/08/2022 1133   PROT 6.8 07/08/2022 1133   ALBUMIN 4.0 07/08/2022 1133   AST 151 (H) 07/08/2022 1133   AST 66 (H) 02/11/2022 1103   ALT 262 (H) 07/08/2022 1133   ALT 30 02/11/2022 1103   ALKPHOS 40 07/08/2022 1133   BILITOT 0.5 07/08/2022 1133   BILITOT 1.4 (H) 02/11/2022 1103   GFRNONAA >60 06/09/2022 0246   GFRNONAA >60 02/11/2022 1103   Lab Results  Component Value Date   CHOL 184 10/06/2019   HDL 52.60 10/06/2019   LDLCALC 106 (H) 10/06/2019   TRIG 123.0 10/06/2019   No results found for: "HGBA1C" Lab Results  Component Value Date   VITAMINB12 431 04/19/2022   Lab Results  Component Value Date   TSH 4.21 07/08/2022   Head CT 06/06/22 1. 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 No acute intracranial hemorrhage  Routine EEG 06/06/22 - Excessive beta, generalized  IMPRESSION: This technically difficult study is within normal limits. The excessive beta activity seen in the background is most  likely due to the effect of benzodiazepine and is a benign EEG pattern. No seizures or epileptiform discharges were seen throughout the recording. A normal interictal EEG does not exclude the diagnosis of epilepsy   I personally reviewed brain Images and previous EEG reports.   ASSESSMENT AND PLAN  41 y.o. year old female  with with history of alcohol abuse and fatty liver who is presenting after first lifetime seizure on October 26.  She reported that her last drink was in June and the night prior to the seizure she had a few seltzer drinks. Denies any seizure risk factors, she was also noted to have a subdural hematoma but she denies any trauma, denies any falls.  She has been on the [place] for about a month, no seizures or seizure-like activity.  I think it is reasonable to discontinue Keppra.  I will also repeat the head CT in the first week of January  Advised patient to continue her other medications, to follow-up with PCP and to return as needed.  Advised her to contact me if she has any seizure or seizure-like activity. Both patient and husband voiced understanding.  Again she is adamant that she is not drinking the amount of alcohol described in her note.   1. Subdural hematoma ([place])   2. Seizures ([place])     Patient Instructions  Discontinue Keppra Repeat CT head first week of January 2024 Continue your other medications Follow-up with primary care doctor, consider referral to counseling for management of stress anxiety Return as needed   Per [place] statutes, patients with seizures are not allowed to drive until they have been seizure-free for six months.  Other recommendations include using caution when using heavy equipment or power tools. Avoid working on ladders or at heights. Take showers instead of baths.  Do not swim alone.  Ensure the water temperature is not too high on the home water heater. Do not go swimming alone. Do not lock yourself in a room alone (i.e.  bathroom). When caring for infants or small children, sit down when holding, feeding, or changing them to minimize risk of injury to the child in the event you have a seizure. Maintain good sleep hygiene. Avoid alcohol.  Also recommend adequate sleep, hydration, good diet and minimize stress.   During the Seizure  - First, ensure adequate ventilation and place patients on the floor on their left side  Loosen clothing around the neck and ensure the airway is patent. If the patient is clenching the teeth, do not force the mouth open with any object as this can cause severe damage - Remove all items from the surrounding that can be hazardous. The patient may be oblivious to what's happening and may not even know what he or she is doing. If the patient is confused and wandering, either gently guide him/her away and block access to outside areas - Reassure the individual and be comforting - Call 911. In most cases, the seizure ends before EMS arrives. However, there are cases when seizures may last over 3 to 5 minutes. Or the individual may have developed breathing difficulties or severe injuries. If a pregnant patient or a person with diabetes develops a seizure, it is prudent to call an ambulance. - Finally, if the patient does not regain full consciousness, then call EMS. Most patients will remain confused for about 45  to 90 minutes after a seizure, so you must use judgment in calling for help. - Avoid restraints but make sure the patient is in a bed with padded side rails - Place the individual in a lateral position with the neck slightly flexed; this will help the saliva drain from the mouth and prevent the tongue from falling backward - Remove all nearby furniture and other hazards from the area - Provide verbal assurance as the individual is regaining consciousness - Provide the patient with privacy if possible - Call for help and start treatment as ordered by the caregiver   After the Seizure  (Postictal Stage)  After a seizure, most patients experience confusion, fatigue, muscle pain and/or a headache. Thus, one should permit the individual to sleep. For the next few days, reassurance is essential. Being calm and helping reorient the person is also of importance.  Most seizures are painless and end spontaneously. Seizures are not harmful to others but can lead to complications such as stress on the lungs, brain and the heart. Individuals with prior lung problems may develop labored breathing and respiratory distress.     Orders Placed This Encounter  Procedures   CT HEAD WO CONTRAST (5MM)    No orders of the defined types were placed in this encounter.   Return if symptoms worsen or fail to improve.  I have spent a total of 45 minutes dedicated to this patient today, preparing to see patient, performing a medically appropriate examination and evaluation, ordering tests and/or medications and procedures, and counseling and educating the patient/family/caregiver; independently interpreting result and communicating results to the family/patient/caregiver; and documenting clinical information in the electronic medical record.   Alric Ran, MD 07/11/2022, 12:58 PM  [facility] [address] [place], [place] [address] [phone]

## 2022-07-11 NOTE — Telephone Encounter (Signed)
[employer] sent to GI they obtain auth [id number] 

## 2022-07-11 NOTE — Therapy (Signed)
OUTPATIENT PHYSICAL THERAPY NEURO TREATMENT NOTE   Patient Name: Janice Arnold MRN: [id number] DOB:02-27-1981, 41 y.o., female Today's Date: 07/11/2022   PCP: Tawnya Crook, MD REFERRING PROVIDER: Tawni Millers, MD  END OF SESSION:  PT End of Session - 07/11/22 1059     Visit Number 2    Number of Visits 9   with eval   Date for PT Re-Evaluation 08/20/22   to allow for scheduling conflicts   Authorization Type [employer]    PT Start Time 1100    PT Stop Time 1142    PT Time Calculation (min) 42 min    Equipment Utilized During Treatment Gait belt    Activity Tolerance Patient tolerated treatment well    Behavior During Therapy [facility] for tasks assessed/performed;Flat affect              Past Medical History:  Diagnosis Date   Alcohol abuse    Anemia    Anxiety    Fatty liver due to alcoholism    History of laparoscopic appendectomy 03/02/2022   Menorrhagia    Vitamin deficiency    Past Surgical History:  Procedure Laterality Date   APPENDECTOMY  03/02/2022   LAPAROSCOPIC APPENDECTOMY N/A 03/02/2022   Procedure: APPENDECTOMY LAPAROSCOPIC;  Surgeon: Donnie Mesa, MD;  Location: [facility];  Service: General;  Laterality: N/A;   Patient Active Problem List   Diagnosis Date Noted   Depression 06/09/2022   Seizure ([place]) 06/06/2022   Alcohol dependence ([place]) 06/06/2022   Intractable nausea and vomiting 05/29/2022   Hypokalemia 05/29/2022   Hypomagnesemia 05/29/2022   QT prolongation 05/29/2022   AKI (acute kidney injury) ([place]) 05/29/2022   Hypochloremia 05/29/2022   Vaginal bleeding 05/29/2022   HTN (hypertension) 05/29/2022   GERD (gastroesophageal reflux disease) 05/29/2022   Abnormal LFTs (liver function tests) 05/29/2022   Local edema 05/09/2022   Adjustment disorder with mixed anxiety and depressed mood 05/09/2022   Iron deficiency anemia due to chronic blood loss 02/11/2022   Vitamin D deficiency 10/07/2019   Transaminitis 10/07/2019   IUD  (intrauterine device) in place 10/06/2019    ONSET DATE: 06/09/2022  REFERRING DIAG: R56.9 (ICD-10-CM) - Seizure-like activity ([place])  THERAPY DIAG:  Muscle weakness (generalized)  Other abnormalities of gait and mobility  Unsteadiness on feet  Other lack of coordination  Rationale for Evaluation and Treatment: Rehabilitation  SUBJECTIVE:                                                                                                                                                                                             SUBJECTIVE STATEMENT: Pt reports  no acute changes since initial evaluation. Pt asking about SLP referral, Telephone Encounter sent to PCP by this therapist after initial eval but no reply or referral yet.  Pt accompanied by: self  PERTINENT HISTORY: B12 deficiency, QT prolongation, Abnormal LFTs (liver function tests), Hypokalemia, Adjustment disorder with mixed anxiety and depressed mood, Seizure, Nausea and vomiting, unspecified vomiting type, Tremor  PAIN:  Are you having pain? No  PRECAUTIONS: Fall  WEIGHT BEARING RESTRICTIONS: No  FALLS: Has patient fallen in last 6 months? No  LIVING ENVIRONMENT: Lives with: lives with their family (husband, kids-12,15,20) Lives in: House/apartment Stairs: Yes: Internal: 12 steps; on right going up Has following equipment at home: shower chair  PLOF: Independent with gait, Independent with transfers, and was not able to do weekly grocery shopping but could run into the store for an item; now can't stand longer than 5 min  PATIENT GOALS: "be back to myself" "be able to stand up"; was working from home but is on leave right now  OBJECTIVE:    TODAY'S TREATMENT:                                                                                                                               THER EX: Standing mini-squats 3 x 10 reps Initially with some unsteadiness and posterior LOB, improves as sets  progress  Resisted sit to stand with RTB, 3 x 10 reps  Resisted sidesteps L/R 6 x 10 ft each direction with RTB Resisted monster walks 6 x 10 ft with RTB  Added to HEP, see bolded below  NMR: Standing alt L/R 6" step-taps with CGA, mild unsteadiness, 3 x 10 reps Progression to alt gum drop taps (easy) Progression to alt cone taps (more challenging)  Blaze pods alt L/R random taps with 5 pods, progression to taps on 6" step with CGA for balance 23 taps, 25 taps, 31 taps Pt exhibits improvement in performance as task progresses with increased # of taps performed and with crossing of midline with LE   PATIENT EDUCATION: Education details: initial HEP Person educated: Patient Education method: Theatre stage manager Education comprehension: verbalized understanding and returned demonstration  HOME EXERCISE PROGRAM: Access Code: [email or web address] URL: [email or web address] Date: 07/11/2022 Prepared by: Excell Seltzer  Exercises - Sit to Stand with Resistance Around Legs  - 1 x daily - 7 x weekly - 3 sets - 10 reps - Side Stepping with Resistance at Ankles  - 1 x daily - 7 x weekly - 3 sets - 10 reps - Forward Monster Walk with Resistance at Ankles and Counter Support  - 1 x daily - 7 x weekly - 3 sets - 10 reps   GOALS: Goals reviewed with patient? Yes  SHORT TERM GOALS=LONG TERM GOALS due to length of POC   LONG TERM GOALS: Target date: 08/09/2022  Pt will be independent with final HEP for improved strength, balance, transfers and gait. Baseline:  Goal status: INITIAL  2.  Pt will improve gait velocity to at least 3.0 ft/sec for improved gait efficiency and performance at mod I level  Baseline: 2.38 ft/sec (11/28) Goal status: INITIAL  3.  Pt will improve FGA to 20/30 for decreased fall risk  Baseline: 14/30 (11/28) Goal status: INITIAL  4.  Pt will ambulate greater than or equal to 1000 feet on 6MWT with LRAD and mod I for improved cardiovascular endurance  and BLE strength.  Baseline: 44 ft with no AD, mod I (11/28) Goal status: INITIAL  5.  Pt will be able to tolerate standing x 10 min while performing functional task to demonstrate improved ability to complete functional activities Baseline: 6 minutes (11/28) Goal status: INITIAL   ASSESSMENT:  CLINICAL IMPRESSION: Emphasis of skilled PT session on performing LE strengthening in order to initiate HEP and working on balance and single leg stability. Pt exhibits some difficulty with performing resisted LE tasks this date with use of red theraband as well as has some difficulty maintaining balance when in SLS. Pt continues to benefit from skilled therapy services to address decreased global endurance and strength. Continue POC.    OBJECTIVE IMPAIRMENTS: cardiopulmonary status limiting activity, decreased activity tolerance, decreased balance, decreased cognition, decreased endurance, decreased knowledge of condition, decreased strength, dizziness, impaired perceived functional ability, and impaired sensation.   ACTIVITY LIMITATIONS: carrying, lifting, bending, standing, squatting, stairs, bathing, and hygiene/grooming  PARTICIPATION LIMITATIONS: meal prep, cleaning, laundry, interpersonal relationship, driving, shopping, community activity, and occupation  PERSONAL FACTORS: B12 deficiency, QT prolongation, Abnormal LFTs (liver function tests), Hypokalemia, Adjustment disorder with mixed anxiety and depressed mood, Seizure, Nausea and vomiting, unspecified vomiting type, Tremor are also affecting patient's functional outcome.   REHAB POTENTIAL: Good  CLINICAL DECISION MAKING: Stable/uncomplicated  EVALUATION COMPLEXITY: Low  PLAN:  PT FREQUENCY: 2x/week  PT DURATION:  9 total sessions (with eval)  PLANNED INTERVENTIONS: Therapeutic exercises, Therapeutic activity, Neuromuscular re-education, Balance training, Gait training, Patient/Family education, Self Care, Joint mobilization,  Stair training, Vestibular training, Canalith repositioning, Visual/preceptual remediation/compensation, DME instructions, Dry Needling, Cognitive remediation, Cryotherapy, Moist heat, Manual therapy, and Re-evaluation  PLAN FOR NEXT SESSION: add to HEP for global strengthening and endurance (squats, walking program, step-ups), assess vestibular system?, progress to step-ups from step-taps, compliant surfaces   Excell Seltzer, PT, DPT, CSRS 07/11/2022, 11:47 AM

## 2022-07-11 NOTE — Patient Instructions (Signed)
Activities to try at home to encourage visual scanning:   1. Word searches 2. Mazes 3. Puzzles 4. Card games 5. Computer games and/or searches 6. Connect-the-dots 7. Where[employer], [employer], look and search books  Activities for environmental (larger) scanning:  1. With supervision, scan for items in grocery store or drugstore.  Begin with a familiar store, then progress to a new store you've never been in before. Make sure you have supervision with this.  2. With supervision, tell a family member or caregiver when it is safe to cross a street after looking all directions and any side streets. However, do NOT cross street unless family member or caregiver is with you and says it is OK. You can also do this as a passenger driver. 3. Play Ispy with my little eye an item that starts with the letter... or that is the color... Then try identifying the item in your environment.    General Vision/Attention Recommendations In the Home: Improve lighting to increase clarity and to reduce glare. Add lighting to areas that are dim and provide task lighting when reading, sewing/knitting, working on a puzzle, etc. Remove lighting when it creates a glare on the item you are viewing.   Full spectrum lighting is a great option to provide adequate lighting while blocking out blue light, which can create glare. Reina Fuse is a good brand of full spectrum lighting and can be found at [employer] (coupons available), [employer], or on [email or web address]  A 24 to 27 watt full spectrum light is generally sufficient. Angle lights to a 90 degrees toward your object or reading material. To reduce glare, angle the light slightly away from the object to decrease glare.  Use blinds to reduce glare created by natural lighting.  Use contrast to better distinguish what items are and where they are. Creating better contrast allows items to be seen more clearly, which means the individual is better able to obtain items they  need to increase safety around the home.  Examples include:  Use dark plates on a light placemat when eating lighter colored foods (chicken, potatoes, bread, etc.).   Use light plates on a dark placemat when eating darker colored foods (beef, beans, salad, etc.)     Paint a room with darker colored items a lighter color or vice versa. Paint door frames a contrasting lighter or darker color (lighter color with dark walls, and a darker color with light walls). Paint the wall behind a mirror reflection a light color if the individual has dark hair, or a dark color if the individual has light hair so that they are better able to see the outline of their hair. Paint or use a contrasting tape to mark the edges of stairs, doorways, appliances, etc. that might not be easily distinguishable by the individual.  Can also use contrasting color to right side of book or card when reading and writing respectively.  For reading, use a solid, dark colored bookmark as a line guide. Read as normal from left to right. Reduce clutter for easier recognition. The less visual clutter there is, the less information an individual with low vision has to process, which will improve their ability and efficiency in recognizing items. Eliminate or change background items that have busy patterns like tablecloths or wallpaper so that items placed on top are easier to make out.   Maximize safety while walking.  To avoid obstacles and judge distance appropriately, it is important to have sufficient lighting while walking.  Consider these suggestions to improve mobility safety. Use a flashlight when natural light or street lights are not sufficient to avoid running into or tripping over obstacles, and to improve depth perception.  Turning Your Head: To assess items on the right, turn your head to that side. This helps to compensate for your visual field cut. This should be done briefly. Once completed, turn head back to its normal  position.    Scanning - Search your environment in a circular pattern to find borders of environment and then the stuff in the middle.

## 2022-07-11 NOTE — Therapy (Signed)
OUTPATIENT OCCUPATIONAL THERAPY NEURO TREATMENT  Patient Name: Janice Arnold MRN: [id number] DOB:07-30-1981, 41 y.o., female Today's Date: 07/11/2022  PCP: Dr. Cherlynn Kaiser REFERRING PROVIDER: Dr. Cathlean Sauer  END OF SESSION:  OT End of Session - 07/11/22 1015     Visit Number 2    Number of Visits 9    Date for OT Re-Evaluation 09/03/22    Authorization Type [employer]    Authorization Time Period 4 weeks    OT Start Time 1017    OT Stop Time 1058    OT Time Calculation (min) 41 min    Activity Tolerance Patient tolerated treatment well    Behavior During Therapy [facility] for tasks assessed/performed;Flat affect             Past Medical History:  Diagnosis Date   Alcohol abuse    Anemia    Anxiety    Fatty liver due to alcoholism    History of laparoscopic appendectomy 03/02/2022   Menorrhagia    Vitamin deficiency    Past Surgical History:  Procedure Laterality Date   APPENDECTOMY  03/02/2022   LAPAROSCOPIC APPENDECTOMY N/A 03/02/2022   Procedure: APPENDECTOMY LAPAROSCOPIC;  Surgeon: Donnie Mesa, MD;  Location: [facility];  Service: General;  Laterality: N/A;   Patient Active Problem List   Diagnosis Date Noted   Depression 06/09/2022   Seizure ([place]) 06/06/2022   Alcohol dependence ([place]) 06/06/2022   Intractable nausea and vomiting 05/29/2022   Hypokalemia 05/29/2022   Hypomagnesemia 05/29/2022   QT prolongation 05/29/2022   AKI (acute kidney injury) ([place]) 05/29/2022   Hypochloremia 05/29/2022   Vaginal bleeding 05/29/2022   HTN (hypertension) 05/29/2022   GERD (gastroesophageal reflux disease) 05/29/2022   Abnormal LFTs (liver function tests) 05/29/2022   Local edema 05/09/2022   Adjustment disorder with mixed anxiety and depressed mood 05/09/2022   Iron deficiency anemia due to chronic blood loss 02/11/2022   Vitamin D deficiency 10/07/2019   Transaminitis 10/07/2019   IUD (intrauterine device) in place 10/06/2019    ONSET DATE: 06/06/22  REFERRING DIAG:   Diagnosis  R56.9 (ICD-10-CM) - Seizure-like activity ([place])    THERAPY DIAG:  Muscle weakness (generalized)  Other abnormalities of gait and mobility  Unsteadiness on feet  Attention and concentration deficit  Frontal lobe and executive function deficit  Other lack of coordination  Visuospatial deficit  Rationale for Evaluation and Treatment: Rehabilitation  SUBJECTIVE:   SUBJECTIVE STATEMENT: She has been having difficulty putting on eye liner due to her coordination. She did not bring in her notebook today to show a comparison to her previous handwriting, but she continues to have difficulties with her handwriting.   Pt accompanied by: self  PERTINENT HISTORY: Pt is a 41 y.o female presented to [facility] ED following a tonic clonic seizure lasting one minute as well as an additional seizure at home and one in the ED 06/06/22. Pt with post-event confusion. 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 PMH significant for alcohol abuse, fatty liver, menorrhagia, vitamin deficiency, anemia, and appendectomy. 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 No  FALLS: Has patient fallen in last 6 months? No  LIVING ENVIRONMENT: Lives with: lives with their spouse Lives in: House/apartment Stairs: yes Has following equipment at home:  shower chair  PLOF: Independent  PATIENT GOALS: To be completely independent  OBJECTIVE: (from evaluation unless otherwise noted)  HAND DOMINANCE: Right  ADLs: Overall ADLs: increased time, fall risk  Transfers/ambulation related to ADLs: Eating: independent Grooming: mod I UB Dressing: mod I LB Dressing: mod I Toileting: mod I Bathing: mod I Tub Shower transfers:  supervision, recommend use of walk in shower, has 3 in 1 commode Equipment:  3 in 1 commode  IADLs: Shopping: dependent Light housekeeping: dependent Meal Prep: dependent Community mobility: mod I Medication management: pt handles, difficulty with opening containers Financial management: pt's husband is assisting Handwriting: 100% legible, increased time and pt reports that it is not at prior level  MOBILITY STATUS:  mod I  ACTIVITY TOLERANCE: Activity tolerance: 10-15 mins in standing prior to rest break  UPPER EXTREMITY ROM:  Bilateral UE's WFLS  Active ROM Right eval Left eval  Shoulder flexion 130 130  (Blank rows = not tested)  UPPER EXTREMITY MMT:     MMT Right eval Left eval  Shoulder flexion 3+/5 4/5  Shoulder abduction    Elbow flexion 4/5 4/5  Elbow extension 4/5 4/5  Wrist flexion    Wrist extension    (Blank rows = not tested)  HAND FUNCTION: Grip strength: Right: 39.6 lbs; Left: 40.7 lbs  COORDINATION: 9 Hole Peg test: Right: 28.82 sec; Left: 33.38 sec Tremors: Resting and ataxia  SENSATION: Light touch: Impaired   COGNITION: Overall cognitive status: decreased attention, word finding difficulties, decreased ability to organize/ sequence, cogntiion to be further addressed with in a functional context,  VISION: Subjective report: Pt reports retinal detachment with seizure Baseline vision:  new visual deficits Visual history: retinal detachment, blurry vision  VISION ASSESSMENT: To be further assessed in functional context  Patient has difficulty with following activities due to following visual impairments: sometimes reading is difficult. She bumps into items on right side   TODAY'S TREATMENT:                                                                                                                               - Therapeutic exercises completed for duration as noted below including:  OT initiated [place] as noted in patient instructions including pick up and placement of items, shuffling and turning cards, item stacking and unstacking, rolling a piece of tissue paper into a ball, rolling golf balls in hand, rolling a pen in between fingers and thumb, picking up and storing items in hand,  and then translating stored items to tips of thumb and index finger before placing them individually into a container. Patient able to return demonstration of all exercises. Handout provided.  - Self-care/home management completed for duration as noted below including: OT initiated handwriting practice with use of lined, tracing paper as noted in pt instructions for numbers and letters as well as free text practice. Pt given foam tubing for completion.  OT educated  pt on positioning leaning into R arm to improve stability and precision with donning makeup. She was also provided with foam tubing for her eyeliner pencil and educated it could be utilized with eating utensils as well.   OT educated pt on scanning and scanning activities to improve safety with functional mobility as noted in pt instructions.   PATIENT EDUCATION: Education details: coordination and scanning HEPs Person educated: Patient Education method: Consulting civil engineer, Demonstration, Verbal cues, and Handouts Education comprehension: verbalized understanding, returned demonstration, and needs further education  HOME EXERCISE PROGRAM: N/a   GOALS: Goals reviewed with patient? Yes potential goals discussed with pt/ spouse    LONG TERM GOALS: Target date: 08/03/22   I with HEP for coordination, grip and proximal strength(low range theraband) Baseline:  Goal status: INITIAL  2.  Pt will verbalize understanding of compensations for visual deficits. Baseline:  Goal status: INITIAL  3.  Pt will perform basic home management/ cooking with supervision demonstrating good safety awareness.. Baseline:  Goal status: INITIAL  4.  Pt will  perform environmental scanning in a busy environment with 90% accuracy. Baseline:  Goal status: INITIAL  5.  Pt will demonstrate improved activity tolerance as evidenced by standing for functional/ IADL tasks for 20 mins or greater prior to rest break. Baseline:  Goal status: INITIAL  6.  Pt will verbalize understanding of compensatory strategies for tremors Baseline:  Goal status: INITIAL  ASSESSMENT:  CLINICAL IMPRESSION: Pt demonstrates impairments with BUE coordination with tremor and ataxic movements as well as visual impairments secondary to right retinal detachment. She continues to benefit from skilled OT services to improve independence and safety with completion of functional activities.    PERFORMANCE DEFICITS: in functional skills including ADLs, IADLs, coordination, dexterity, sensation, ROM, strength, flexibility, Fine motor control, Gross motor control, mobility, balance, endurance, decreased knowledge of precautions, decreased knowledge of use of DME, vision, and UE functional use, cognitive skills including attention, learn, memory, problem solving, safety awareness, sequencing, thought, and understand, and psychosocial skills including coping strategies, environmental adaptation, habits, interpersonal interactions, and routines and behaviors.   IMPAIRMENTS: are limiting patient from ADLs, IADLs, work, leisure, and social participation.   CO-MORBIDITIES: may have co-morbidities  that affects occupational performance. Patient will benefit from skilled OT to address above impairments and improve overall function.  REHAB POTENTIAL: Good  PLAN:  OT FREQUENCY: 2x/week  OT DURATION: 4 weeks plus eval  PLANNED INTERVENTIONS: self care/ADL training, therapeutic exercise, therapeutic activity, neuromuscular re-education, manual therapy, passive range of motion, balance training, paraffin, fluidotherapy, moist heat, cryotherapy, contrast bath, patient/family education, cognitive  remediation/compensation, visual/perceptual remediation/compensation, energy conservation, coping strategies training, DME and/or AE instructions, and Re-evaluation  RECOMMENDED OTHER SERVICES: ST for cognition/ word finding  CONSULTED AND AGREED WITH PLAN OF CARE: Patient and family member/caregiver  PLAN FOR NEXT SESSION: review coordination HEP, environmental scanning,  and visual compensation strategies; theraband and putty; tremor reduction strategies   Dennis Bast, OT 07/11/2022, 11:19 AM

## 2022-07-12 ENCOUNTER — Other Ambulatory Visit: Payer: Self-pay | Admitting: Obstetrics and Gynecology

## 2022-07-15 ENCOUNTER — Encounter: Payer: Self-pay | Admitting: Cardiovascular Disease

## 2022-07-15 ENCOUNTER — Ambulatory Visit: Payer: Managed Care, Other (non HMO) | Attending: Cardiovascular Disease | Admitting: Cardiovascular Disease

## 2022-07-15 DIAGNOSIS — S065XAA Traumatic subdural hemorrhage with loss of consciousness status unknown, initial encounter: Secondary | ICD-10-CM

## 2022-07-15 DIAGNOSIS — R569 Unspecified convulsions: Secondary | ICD-10-CM | POA: Diagnosis not present

## 2022-07-15 DIAGNOSIS — R9431 Abnormal electrocardiogram [ECG] [EKG]: Secondary | ICD-10-CM | POA: Diagnosis not present

## 2022-07-15 DIAGNOSIS — E785 Hyperlipidemia, unspecified: Secondary | ICD-10-CM

## 2022-07-15 DIAGNOSIS — F4323 Adjustment disorder with mixed anxiety and depressed mood: Secondary | ICD-10-CM

## 2022-07-15 DIAGNOSIS — R400 Somnolence: Secondary | ICD-10-CM | POA: Diagnosis not present

## 2022-07-15 DIAGNOSIS — R0683 Snoring: Secondary | ICD-10-CM

## 2022-07-15 DIAGNOSIS — F1010 Alcohol abuse, uncomplicated: Secondary | ICD-10-CM

## 2022-07-15 DIAGNOSIS — K219 Gastro-esophageal reflux disease without esophagitis: Secondary | ICD-10-CM

## 2022-07-15 DIAGNOSIS — R7989 Other specified abnormal findings of blood chemistry: Secondary | ICD-10-CM

## 2022-07-15 MED ORDER — METOPROLOL SUCCINATE ER 25 MG PO TB24: 25.0000 mg | ORAL_TABLET | Freq: Every day | ORAL | 3 refills | Status: DC

## 2022-07-15 NOTE — Progress Notes (Signed)
Cardiology Office Note    Date:  07/27/2022   ID:  TENZIN PAVON, DOB Jun 20, 1981, MRN [id number]  PCP:  Tawnya Crook, MD  Cardiologist:  Shelva Majestic, MD   New cardiology consultation referred by Dr. Cherlynn Kaiser with a history of EtOH abuse, seizure disorder, and recent concern for QT prolongation.  Chief Complaint  Patient presents with   New Patient (Initial Visit)   Headache   Shortness of Breath   Edema    History of Present Illness:  Janice Arnold is a 41 y.o. female who has a history of significant heavy EtOH use drinking at least 4 to 5 glasses of vodka daily with recent discontinuance in June 2023.  She has a history of anxiety, fatty liver.  In early June she was having cyclic vomiting not relieved with Zofran.  At time she was vomiting 4 times per day.  She was admitted to the hospital on June 06, 2022 for seizures.  She had several seizures that day.  Head CT showed right cerebral convexity chronic subdural hematoma/hygroma measuring 1.3 cm in maximal thickness with mild mass effect and 3 mm right to left midline shift.  There was no acute intracranial hemorrhage.  On EEG she had excessive beta, generalized.  There was no prior history of seizures and ultimately was started on Ativan in addition to [place].  She subsequently has been evaluated by Dr. Cherlynn Kaiser and also has seen neurology.  During her neurologic evaluation it was advised she discontinue Keppra.  She is to have a repeat head CT  in January 2024.  During her hospitalization, potassium was low.  When last seen by Dr. Cherlynn Kaiser, there was concerns for prolonged QT interval.  At that time Lexapro was discontinued and changed to sertraline.    Presently, she denies chest pain.  She admits to having a heart murmur since childhood.  She is on supplemental V25 and folic acid.  She continues to be on lorazepam 2 mg every 6 hours as needed, Zofran, Phenergan, and is now on sertraline off Lexapro.  She is on norethindrone.   She admits to having involuntary movements with sleep.  She snores, admits to frequent awakenings as well as nocturia.  She presents for evaluation.    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 120 tablet 3   KLOR-CON M20 20 MEQ tablet Take 40 mEq by mouth 3 (three) times daily.     LORazepam (ATIVAN) 2 MG tablet Take 1 tablet (2 mg total) by mouth every 6 (six) hours as needed for anxiety. 120 tablet 0   LOW-OGESTREL 0.3-30 MG-MCG tablet Take 1 tablet by mouth daily.     melatonin 1 MG TABS tablet Take 1 mg by mouth at bedtime as needed (sleep).     norethindrone (AYGESTIN) 5 MG tablet  Take 10 mg by mouth daily.     omeprazole (PRILOSEC) 40 MG capsule Take 1 capsule (40 mg total) by mouth daily. 90 capsule 1   ondansetron (ZOFRAN-ODT) 4 MG disintegrating tablet Take 1 tablet (4 mg total) by mouth every 8 (eight) hours as needed for nausea or vomiting. 60 tablet 3   promethazine (PHENERGAN) 25 MG tablet Take 1 tablet (25 mg total) by mouth every 8 (eight) hours as needed for nausea or vomiting. 20 tablet 0   sertraline (ZOLOFT) 100 MG tablet Take 1 tablet (100 mg total) by mouth daily. 90 tablet 1   Vitamin D, Ergocalciferol, (DRISDOL) 1.25 MG (50000 UNIT) CAPS capsule TAKE 1 CAPSULE (50,000 UNITS) BY MOUTH EVERY 7 DAYS (Patient taking differently: Take 50,000 Units by mouth every 7 (seven)  days.) 12 capsule 0   No facility-administered medications prior to visit.     Allergies:   Patient has no known allergies.   Social History   Socioeconomic History   Marital status: Married    Spouse name: Not on file   Number of children: 3   Years of education: Not on file   Highest education level: Not on file  Occupational History   Occupation: Government social research officer  Tobacco Use   Smoking status: Never   Smokeless tobacco: Never  Vaping Use   Vaping Use: Never used  Substance and Sexual Activity   Alcohol use: Not Currently    Alcohol/week: 21.0 standard drinks of alcohol    Types: 21 Glasses of wine per week    Comment: cut back and then started drinking a littl emore, currently 2 seltzers/day; h/o heavy use with 1/2 fifth daily   Drug use: Never   Sexual activity: Yes  Other Topics Concern   Not on file  Social History Narrative   Project mgr   Social Determinants of Health   Financial Resource Strain: Not on file  Food Insecurity: No Food Insecurity (06/07/2022)   Hunger Vital Sign    Worried About Running Out of Food in the Last Year: Never true    Ran Out of Food in the Last Year: Never true  Transportation Needs: No Transportation Needs (06/07/2022)   PRAPARE - Hydrologist (Medical): No    Lack of Transportation (Non-Medical): No  Physical Activity: Not on file  Stress: Not on file  Social Connections: Not on file     Socially she is married and her second marriage for 20 years.  She has 3 children.  She works for [employer].  She had heavy drinking and stopped in June 2023.  She does not exercise.  Family History:  The patient's family history includes Bone cancer in her mother; Breast cancer in her maternal grandmother and mother; Cervical cancer in her mother; Colon cancer (age of onset: 94) in her maternal great-grandmother.  Mother is living at age 22 and has breast cancer metastasized to bone.  She does not know her  father.  She has 1 sister age 55.  ROS General: Negative; No fevers, chills, or night sweats;  HEENT: Negative; No changes in vision or hearing, sinus congestion, difficulty swallowing Pulmonary: Negative; No cough, wheezing, shortness of breath, hemoptysis Cardiovascular: Negative; No chest pain, presyncope, syncope, palpitations GI: Negative; No nausea, vomiting, diarrhea, or abdominal pain GU: Negative; No dysuria, hematuria, or difficulty voiding Musculoskeletal: Negative; no myalgias, joint pain, or weakness Hematologic/Oncology: Negative; no easy bruising, bleeding Endocrine: Negative; no heat/cold intolerance; no diabetes Neuro: Negative;  no changes in balance, headaches Skin: Negative; No rashes or skin lesions Psychiatric: Negative; No behavioral problems, depression Sleep: Negative; No snoring, daytime sleepiness, hypersomnolence, bruxism, restless legs, hypnogognic hallucinations, no cataplexy Other comprehensive 14 point system review is negative.   PHYSICAL EXAM:   VS:  BP 124/68 (BP Location: Right Arm, Patient Position: Sitting, Cuff Size: Normal)   Pulse (!) 105   Ht _0  (1.702 m)   Wt 215 lb (97.5 kg)   LMP 06/17/2022 (Approximate) Comment: has been on for last 8 weeks, stopped 2 weeks gao  BMI 33.67 kg/m     Repeat blood pressure by me was 112/64 supine and 112/66 standing.  Wt Readings from Last 3 Encounters:  07/15/22 215 lb (97.5 kg)  07/11/22 204 lb 8 oz (92.8 kg)  07/08/22 202 lb 4 oz (91.7 kg)    General: Alert, oriented, no distress.  Skin: normal turgor, no rashes, warm and dry HEENT: Normocephalic, atraumatic. Pupils equal round and reactive to light; sclera anicteric; extraocular muscles intact;  Nose without nasal septal hypertrophy Mouth/Parynx benign; Mallinpatti scale 3 Neck: No JVD, no carotid bruits; normal carotid upstroke Lungs: clear to ausculatation and percussion; no wheezing or rales Chest wall: without tenderness to  palpitation Heart: PMI not displaced, RRR, s1 s2 normal, 1/6 systolic murmur, no diastolic murmur, no rubs, gallops, thrills, or heaves Abdomen: soft, nontender; no hepatosplenomehaly, BS+; abdominal aorta nontender and not dilated by palpation. Back: no CVA tenderness Pulses 2+ Musculoskeletal: full range of motion, normal strength, no joint deformities Extremities: no clubbing cyanosis or edema, Homan's sign negative  Neurologic: grossly nonfocal; Cranial nerves grossly wnl Psychologic: Normal mood and affect   Studies/Labs Reviewed:   July 15, 2022 ECG (independently read by me): Sinus tachycardia at 105; no significant ST changes,  QTc 470 msec    I personally reviewed the ECG from June 06, 2022 which shows sinus tachycardia at 108 bpm, nonspecific T wave abnormality.  QTc interval 456 ms.  Recent Labs:    Latest Ref Rng & Units 07/16/2022    9:39 AM 07/08/2022   11:33 AM 06/25/2022    9:57 AM  BMP  Glucose 70 - 99 mg/dL 82  72  102   BUN 6 - 24 mg/dL _1 Creatinine 0.57 - 1.00 mg/dL 0.58  0.51  0.41   BUN/Creat Ratio 9 - 23 21     Sodium 134 - 144 mmol/L 137  136  137   Potassium 3.5 - 5.2 mmol/L 4.3  4.2  3.7   Chloride 96 - 106 mmol/L 103  102  107   CO2 20 - 29 mmol/L _2 Calcium 8.7 - 10.2 mg/dL 9.4  9.4  8.5         Latest Ref Rng & Units 07/16/2022    9:39 AM 07/08/2022   11:33 AM 06/25/2022    9:57 AM  Hepatic Function  Total Protein 6.0 - 8.5 g/dL 6.7  6.8  6.0   Albumin 3.9 - 4.9 g/dL 4.1  4.0  3.6   AST 0 - 40 IU/L 116  151  136   ALT 0 - 32 IU/L 139  262  243   Alk Phosphatase 44 - 121 IU/L 46  40  47   Total Bilirubin 0.0 - 1.2 mg/dL 0.5  0.5  0.6        Latest Ref Rng & Units 07/16/2022    9:39 AM 07/08/2022  11:33 AM 06/24/2022    9:32 AM  CBC  WBC 3.4 - 10.8 x10E3/uL 10.4  11.1  11.2   Hemoglobin 11.1 - 15.9 g/dL 12.9  12.6  12.2   Hematocrit 34.0 - 46.6 % 37.6  37.5  37.0   Platelets 150 - 450 x10E3/uL 295  359.0   264    Lab Results  Component Value Date   MCV 102 (H) 07/16/2022   MCV 105.6 (H) 07/08/2022   MCV 111.8 (H) 06/24/2022   Lab Results  Component Value Date   TSH 4.21 07/08/2022   No results found for: "HGBA1C"   BNP No results found for: "BNP"  ProBNP No results found for: "PROBNP"   Lipid Panel     Component Value Date/Time   CHOL 238 (H) 07/16/2022 0939   TRIG 104 07/16/2022 0939   HDL 32 (L) 07/16/2022 0939   CHOLHDL 7.4 (H) 07/16/2022 0939   CHOLHDL 3 10/06/2019 0909   VLDL 24.6 10/06/2019 0909   LDLCALC 187 (H) 07/16/2022 0939   LABVLDL 19 07/16/2022 0939     RADIOLOGY: No results found.   Additional studies/ records that were reviewed today include:  I reviewed the extensive records from her hospitalization, Dr. Cherlynn Kaiser, as well as Dr. April Manson of neurology   ASSESSMENT:    1. QT prolongation   2. Snoring   3. Daytime somnolence   4. Seizure ([place])   5. Adjustment disorder with mixed anxiety and depressed mood   6. Alcohol abuse; quit June 2023   7. Hyperlipidemia, unspecified hyperlipidemia type   8. Abnormal LFTs (liver function tests)   9. Gastroesophageal reflux disease without esophagitis   10. Subdural hematoma [facility])     PLAN:  Ms. Meosha Castanon is a 41 year old female who has a longstanding history of heavy alcohol use and quit "cold [place] "in June 2023.  She has had issues with documented fatty liver, previous LFT elevation, as well as cyclical vomiting.  She ultimately presented to the hospital on June 06, 2022 following development of seizure activity.  She was treated initially with Keppra and dexamethasone.  She also was started on Ativan for significant anxiety.  Her blood pressure was well-controlled during her hospitalization without antihypertensive medication.  She was on pantoprazole for GERD and on presentation was noted to be hypokalemic with a potassium of 3.2.  Head CT demonstrated right cerebral convexity chronic subdural  hematoma/hygroma measuring 1.3 cm in maximal thickness with mild mass effect and 3 mm right to left midline shift without acute intracranial hemorrhage.  She subsequently underwent neurologic evaluation and when seen on July 11, 2022 Keppra was discontinued and there are plans for follow-up head CT.  Today, her blood pressure is stable without orthostatic change and was 112/64 supine and 112/66 standing when taken by me.  Her ECG shows nonspecific T wave abnormality.  Her QT interval is minimally increased at 470 ms.  She had been taken off Lexapro and is now on sertraline.  Her resting pulse today is tachycardic at 105.  Presently, I recommending she undergo a 2D echo Doppler study for assessment of systolic and diastolic function.  I am adding metoprolol succinate 25 mg which will be helpful with her sinus tachycardia and mild QTc prolongation.  There is no evidence for preexcitation.  I am recommending she undergo follow-up laboratory with a comprehensive metabolic panel, CBC, magnesium level and will check a fasting lipid panel.  I am concerned that she may have obstructive sleep apnea with  snoring, frequent awakenings, frequent nocturia, and involuntary leg movements during sleep.  I am scheduling her for a sleep evaluation and will try to obtain a split-night study.  I will see her in 6 to 8 weeks for follow-up evaluation or sooner as needed.   Medication Adjustments/Labs and Tests Ordered: Current medicines are reviewed at length with the patient today.  Concerns regarding medicines are outlined above.  Medication changes, Labs and Tests ordered today are listed in the Patient Instructions below. Patient Instructions  Medication Instructions:  START Metoprolol Succinate 25 mg daily   *If you need a refill on your cardiac medications before your next appointment, please call your pharmacy*  Lab Work: Your physician recommends that you return for lab work:  CBC CMP Fasting Lipid Panel-DO NOT  eat or drink past midnight. Okay to have water Magnesium   If you have labs (blood work) drawn today and your tests are completely normal, you will receive your results only by: [place] (if you have MyChart) OR A paper copy in the mail If you have any lab test that is abnormal or we need to change your treatment, we will call you to review the results.   Testing/Procedures: Your physician has requested that you have an echocardiogram. Echocardiography is a painless test that uses sound waves to create images of your heart. It provides your doctor with information about the size and shape of your heart and how well your heart's chambers and valves are working. This procedure takes approximately one hour. There are no restrictions for this procedure. Please do NOT wear cologne, perfume, aftershave, or lotions (deodorant is allowed). Please arrive 15 minutes prior to your appointment time.  Your physician has recommended that you have a sleep study. This test records several body functions during sleep, including: brain activity, eye movement, oxygen and carbon dioxide blood levels, heart rate and rhythm, breathing rate and rhythm, the flow of air through your mouth and nose, snoring, body muscle movements, and chest and belly movement.   Follow-Up: At [facility], you and your health needs are our priority.  As part of our continuing mission to provide you with exceptional heart care, we have created designated Provider Care Teams.  These Care Teams include your primary Cardiologist (physician) and Advanced Practice Providers (APPs -  Physician Assistants and Nurse Practitioners) who all work together to provide you with the care you need, when you need it.   Your next appointment:   6 week(s)  The format for your next appointment:   In Person  Provider:   None     Other Instructions  Important Information About Sugar         Signed, Shelva Majestic, MD   07/27/2022 11:46 AM    [facility] [address], [place], [place], [place]  [address] Phone: [phone]

## 2022-07-15 NOTE — Patient Instructions (Signed)
Medication Instructions:  START Metoprolol Succinate 25 mg daily   *If you need a refill on your cardiac medications before your next appointment, please call your pharmacy*  Lab Work: Your physician recommends that you return for lab work:  CBC CMP Fasting Lipid Panel-DO NOT eat or drink past midnight. Okay to have water Magnesium   If you have labs (blood work) drawn today and your tests are completely normal, you will receive your results only by: [place] (if you have MyChart) OR A paper copy in the mail If you have any lab test that is abnormal or we need to change your treatment, we will call you to review the results.   Testing/Procedures: Your physician has requested that you have an echocardiogram. Echocardiography is a painless test that uses sound waves to create images of your heart. It provides your doctor with information about the size and shape of your heart and how well your heart's chambers and valves are working. This procedure takes approximately one hour. There are no restrictions for this procedure. Please do NOT wear cologne, perfume, aftershave, or lotions (deodorant is allowed). Please arrive 15 minutes prior to your appointment time.  Your physician has recommended that you have a sleep study. This test records several body functions during sleep, including: brain activity, eye movement, oxygen and carbon dioxide blood levels, heart rate and rhythm, breathing rate and rhythm, the flow of air through your mouth and nose, snoring, body muscle movements, and chest and belly movement.   Follow-Up: At [facility], you and your health needs are our priority.  As part of our continuing mission to provide you with exceptional heart care, we have created designated Provider Care Teams.  These Care Teams include your primary Cardiologist (physician) and Advanced Practice Providers (APPs -  Physician Assistants and Nurse Practitioners) who all work together  to provide you with the care you need, when you need it.   Your next appointment:   6 week(s)  The format for your next appointment:   In Person  Provider:   None     Other Instructions  Important Information About Sugar

## 2022-07-16 ENCOUNTER — Ambulatory Visit: Payer: Managed Care, Other (non HMO)

## 2022-07-16 ENCOUNTER — Ambulatory Visit: Payer: Managed Care, Other (non HMO) | Attending: Internal Medicine | Admitting: Occupational Therapy

## 2022-07-16 ENCOUNTER — Ambulatory Visit: Payer: Managed Care, Other (non HMO) | Admitting: Physical Therapy

## 2022-07-16 DIAGNOSIS — R278 Other lack of coordination: Secondary | ICD-10-CM | POA: Insufficient documentation

## 2022-07-16 DIAGNOSIS — R2689 Other abnormalities of gait and mobility: Secondary | ICD-10-CM | POA: Insufficient documentation

## 2022-07-16 DIAGNOSIS — R41840 Attention and concentration deficit: Secondary | ICD-10-CM | POA: Insufficient documentation

## 2022-07-16 DIAGNOSIS — R41842 Visuospatial deficit: Secondary | ICD-10-CM | POA: Insufficient documentation

## 2022-07-16 DIAGNOSIS — R41844 Frontal lobe and executive function deficit: Secondary | ICD-10-CM | POA: Insufficient documentation

## 2022-07-16 DIAGNOSIS — M6281 Muscle weakness (generalized): Secondary | ICD-10-CM | POA: Insufficient documentation

## 2022-07-16 DIAGNOSIS — R2681 Unsteadiness on feet: Secondary | ICD-10-CM | POA: Insufficient documentation

## 2022-07-16 NOTE — Therapy (Deleted)
OUTPATIENT OCCUPATIONAL THERAPY NEURO TREATMENT  Patient Name: Janice Arnold MRN: [id number] DOB:1980-12-31, 41 y.o., female Today's Date: 07/16/2022  PCP: Dr. Cherlynn Kaiser REFERRING PROVIDER: Dr. Cathlean Sauer  END OF SESSION:    Past Medical History:  Diagnosis Date   Alcohol abuse    Anemia    Anxiety    Fatty liver due to alcoholism    History of laparoscopic appendectomy 03/02/2022   Menorrhagia    Vitamin deficiency    Past Surgical History:  Procedure Laterality Date   APPENDECTOMY  03/02/2022   LAPAROSCOPIC APPENDECTOMY N/A 03/02/2022   Procedure: APPENDECTOMY LAPAROSCOPIC;  Surgeon: Donnie Mesa, MD;  Location: [facility];  Service: General;  Laterality: N/A;   Patient Active Problem List   Diagnosis Date Noted   Depression 06/09/2022   Seizure ([place]) 06/06/2022   Alcohol dependence ([place]) 06/06/2022   Intractable nausea and vomiting 05/29/2022   Hypokalemia 05/29/2022   Hypomagnesemia 05/29/2022   QT prolongation 05/29/2022   AKI (acute kidney injury) ([place]) 05/29/2022   Hypochloremia 05/29/2022   Vaginal bleeding 05/29/2022   HTN (hypertension) 05/29/2022   GERD (gastroesophageal reflux disease) 05/29/2022   Abnormal LFTs (liver function tests) 05/29/2022   Local edema 05/09/2022   Adjustment disorder with mixed anxiety and depressed mood 05/09/2022   Iron deficiency anemia due to chronic blood loss 02/11/2022   Vitamin D deficiency 10/07/2019   Transaminitis 10/07/2019   IUD (intrauterine device) in place 10/06/2019    ONSET DATE: 06/06/22  REFERRING DIAG:  Diagnosis  R56.9 (ICD-10-CM) - Seizure-like activity ([place])    THERAPY DIAG:  No diagnosis found.  Rationale for Evaluation and Treatment: Rehabilitation  SUBJECTIVE:   SUBJECTIVE STATEMENT: She has been having difficulty putting on eye liner due to her coordination. She did not bring in her notebook today to show a comparison to her previous handwriting, but she continues to have difficulties with her  handwriting.   Pt accompanied by: self  PERTINENT HISTORY: Pt is a 41 y.o female presented [facility] ED following a tonic clonic seizure lasting one minute as well as an additional seizure at home and one in the ED 06/06/22. Pt with post-event confusion. 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 PMH significant for alcohol abuse, fatty liver, menorrhagia, vitamin deficiency, anemia, and appendectomy. 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 No  FALLS: Has patient fallen in last 6 months? No  LIVING ENVIRONMENT: Lives with: lives with their spouse Lives in: House/apartment Stairs: yes Has following equipment at home: shower chair  PLOF: Independent  PATIENT GOALS: To be completely independent  OBJECTIVE: (from evaluation unless otherwise noted)  HAND DOMINANCE: Right  ADLs: Overall ADLs: increased time, fall risk  Transfers/ambulation related to ADLs: Eating: independent Grooming: mod I UB Dressing: mod I LB Dressing: mod I Toileting: mod I Bathing: mod I Tub Shower transfers: supervision, recommend use of walk in shower, has 3 in 1 commode Equipment:  3 in 1 commode  IADLs: Shopping: dependent Light housekeeping: dependent Meal Prep: dependent Community mobility: mod I Medication management: pt handles, difficulty with opening containers Financial management: pt's husband is assisting Handwriting: 100% legible, increased time and pt reports that it is not at prior level  MOBILITY STATUS:  mod I  ACTIVITY  TOLERANCE: Activity tolerance: 10-15 mins in standing prior to rest break  UPPER EXTREMITY ROM:  Bilateral UE's WFLS  Active ROM Right eval Left eval  Shoulder flexion 130 130   (Blank rows = not tested)  UPPER EXTREMITY MMT:     MMT Right eval Left eval  Shoulder flexion 3+/5 4/5  Shoulder abduction    Elbow flexion 4/5 4/5  Elbow extension 4/5 4/5  Wrist flexion    Wrist extension    (Blank rows = not tested)  HAND FUNCTION: Grip strength: Right: 39.6 lbs; Left: 40.7 lbs  COORDINATION: 9 Hole Peg test: Right: 28.82 sec; Left: 33.38 sec Tremors: Resting and ataxia  SENSATION: Light touch: Impaired   COGNITION: Overall cognitive status: decreased attention, word finding difficulties, decreased ability to organize/ sequence, cogntiion to be further addressed with in a functional context,  VISION: Subjective report: Pt reports retinal detachment with seizure Baseline vision:  new visual deficits Visual history: retinal detachment, blurry vision  VISION ASSESSMENT: To be further assessed in functional context  Patient has difficulty with following activities due to following visual impairments: sometimes reading is difficult. She bumps into items on right side   TODAY'S TREATMENT:                                                                                                                               - Therapeutic exercises completed for duration as noted below including:  OT initiated [place] as noted in patient instructions including pick up and placement of items, shuffling and turning cards, item stacking and unstacking, rolling a piece of tissue paper into a ball, rolling golf balls in hand, rolling a pen in between fingers and thumb, picking up and storing items in hand,  and then translating stored items to tips of thumb and index finger before placing them individually into a container. Patient able to return demonstration of all exercises. Handout provided.  - Self-care/home management completed for duration as noted below including: OT initiated handwriting practice with use of lined, tracing paper as  noted in pt instructions for numbers and letters as well as free text practice. Pt given foam tubing for completion.  OT educated pt on positioning leaning into R arm to improve stability and precision with donning makeup. She was also provided with foam tubing for her eyeliner pencil and educated it could be utilized with eating utensils as well.   OT educated pt on scanning and scanning activities to improve safety with functional mobility as noted in pt instructions.   PATIENT EDUCATION: Education details: coordination and scanning HEPs Person educated: Patient Education method: Consulting civil engineer, Demonstration, Verbal cues, and Handouts Education comprehension: verbalized understanding, returned demonstration, and needs further education  HOME EXERCISE PROGRAM: N/a   GOALS: Goals reviewed with patient? Yes potential goals discussed with pt/ spouse    LONG TERM GOALS: Target date: 08/03/22   I with HEP for coordination, grip and proximal  strength(low range theraband) Baseline:  Goal status: INITIAL  2.  Pt will verbalize understanding of compensations for visual deficits. Baseline:  Goal status: INITIAL  3.  Pt will perform basic home management/ cooking with supervision demonstrating good safety awareness.. Baseline:  Goal status: INITIAL  4.  Pt will perform environmental scanning in a busy environment with 90% accuracy. Baseline:  Goal status: INITIAL  5.  Pt will demonstrate improved activity tolerance as evidenced by standing for functional/ IADL tasks for 20 mins or greater prior to rest break. Baseline:  Goal status: INITIAL  6.  Pt will verbalize understanding of compensatory strategies for tremors Baseline:  Goal status: INITIAL  ASSESSMENT:  CLINICAL IMPRESSION: Pt demonstrates impairments with BUE coordination with tremor and ataxic movements as well as visual impairments secondary to right retinal detachment. She continues to benefit from skilled OT  services to improve independence and safety with completion of functional activities.    PERFORMANCE DEFICITS: in functional skills including ADLs, IADLs, coordination, dexterity, sensation, ROM, strength, flexibility, Fine motor control, Gross motor control, mobility, balance, endurance, decreased knowledge of precautions, decreased knowledge of use of DME, vision, and UE functional use, cognitive skills including attention, learn, memory, problem solving, safety awareness, sequencing, thought, and understand, and psychosocial skills including coping strategies, environmental adaptation, habits, interpersonal interactions, and routines and behaviors.   IMPAIRMENTS: are limiting patient from ADLs, IADLs, work, leisure, and social participation.   CO-MORBIDITIES: may have co-morbidities  that affects occupational performance. Patient will benefit from skilled OT to address above impairments and improve overall function.  REHAB POTENTIAL: Good  PLAN:  OT FREQUENCY: 2x/week  OT DURATION: 4 weeks plus eval  PLANNED INTERVENTIONS: self care/ADL training, therapeutic exercise, therapeutic activity, neuromuscular re-education, manual therapy, passive range of motion, balance training, paraffin, fluidotherapy, moist heat, cryotherapy, contrast bath, patient/family education, cognitive remediation/compensation, visual/perceptual remediation/compensation, energy conservation, coping strategies training, DME and/or AE instructions, and Re-evaluation  RECOMMENDED OTHER SERVICES: ST for cognition/ word finding  CONSULTED AND AGREED WITH PLAN OF CARE: Patient and family member/caregiver  PLAN FOR NEXT SESSION: review coordination HEP, environmental scanning,  and visual compensation strategies; theraband and putty; tremor reduction strategies   Celestia Duva, OT 07/16/2022, 7:16 AM

## 2022-07-17 ENCOUNTER — Ambulatory Visit: Payer: Managed Care, Other (non HMO) | Admitting: Occupational Therapy

## 2022-07-17 ENCOUNTER — Ambulatory Visit: Payer: Managed Care, Other (non HMO) | Admitting: Physical Therapy

## 2022-07-17 VITALS — BP 131/75 | HR 97

## 2022-07-17 DIAGNOSIS — R278 Other lack of coordination: Secondary | ICD-10-CM | POA: Diagnosis present

## 2022-07-17 DIAGNOSIS — R2681 Unsteadiness on feet: Secondary | ICD-10-CM | POA: Diagnosis present

## 2022-07-17 DIAGNOSIS — R41844 Frontal lobe and executive function deficit: Secondary | ICD-10-CM

## 2022-07-17 DIAGNOSIS — R41842 Visuospatial deficit: Secondary | ICD-10-CM | POA: Diagnosis present

## 2022-07-17 DIAGNOSIS — R2689 Other abnormalities of gait and mobility: Secondary | ICD-10-CM

## 2022-07-17 DIAGNOSIS — M6281 Muscle weakness (generalized): Secondary | ICD-10-CM | POA: Diagnosis present

## 2022-07-17 DIAGNOSIS — R41840 Attention and concentration deficit: Secondary | ICD-10-CM | POA: Diagnosis present

## 2022-07-17 LAB — CBC
Hematocrit: 37.6 % (ref 34.0–46.6)
Hemoglobin: 12.9 g/dL (ref 11.1–15.9)
MCH: 35 pg — ABNORMAL HIGH (ref 26.6–33.0)
MCHC: 34.3 g/dL (ref 31.5–35.7)
MCV: 102 fL — ABNORMAL HIGH (ref 79–97)
Platelets: 295 10*3/uL (ref 150–450)
RBC: 3.69 x10E6/uL — ABNORMAL LOW (ref 3.77–5.28)
RDW: 14.2 % (ref 11.7–15.4)
WBC: 10.4 10*3/uL (ref 3.4–10.8)

## 2022-07-17 LAB — LIPID PANEL
Chol/HDL Ratio: 7.4 ratio — ABNORMAL HIGH (ref 0.0–4.4)
Cholesterol, Total: 238 mg/dL — ABNORMAL HIGH (ref 100–199)
HDL: 32 mg/dL — ABNORMAL LOW (ref >39)
LDL Chol Calc (NIH): 187 mg/dL — ABNORMAL HIGH (ref 0–99)
Triglycerides: 104 mg/dL (ref 0–149)
VLDL Cholesterol Cal: 19 mg/dL (ref 5–40)

## 2022-07-17 LAB — COMPREHENSIVE METABOLIC PANEL
ALT: 139 IU/L — ABNORMAL HIGH (ref 0–32)
AST: 116 IU/L — ABNORMAL HIGH (ref 0–40)
Albumin/Globulin Ratio: 1.6 (ref 1.2–2.2)
Albumin: 4.1 g/dL (ref 3.9–4.9)
Alkaline Phosphatase: 46 IU/L (ref 44–121)
BUN/Creatinine Ratio: 21 (ref 9–23)
BUN: 12 mg/dL (ref 6–24)
Bilirubin Total: 0.5 mg/dL (ref 0.0–1.2)
CO2: 21 mmol/L (ref 20–29)
Calcium: 9.4 mg/dL (ref 8.7–10.2)
Chloride: 103 mmol/L (ref 96–106)
Creatinine, Ser: 0.58 mg/dL (ref 0.57–1.00)
Globulin, Total: 2.6 g/dL (ref 1.5–4.5)
Glucose: 82 mg/dL (ref 70–99)
Potassium: 4.3 mmol/L (ref 3.5–5.2)
Sodium: 137 mmol/L (ref 134–144)
Total Protein: 6.7 g/dL (ref 6.0–8.5)
eGFR: 117 mL/min/{1.73_m2} (ref >59)

## 2022-07-17 LAB — MAGNESIUM: Magnesium: 1.7 mg/dL (ref 1.6–2.3)

## 2022-07-17 NOTE — Therapy (Signed)
OUTPATIENT OCCUPATIONAL THERAPY NEURO TREATMENT  Patient Name: Janice Arnold MRN: [id number] DOB:15-Jul-1981, 41 y.o., female Today's Date: 07/17/2022  PCP: Dr. Cherlynn Kaiser REFERRING PROVIDER: Dr. Cathlean Sauer  END OF SESSION:  OT End of Session - 07/17/22 0721     Visit Number 3    Date for OT Re-Evaluation 09/03/22    Authorization Type [employer]    Authorization Time Period 4 weeks    OT Start Time 0720    OT Stop Time 0800    OT Time Calculation (min) 40 min              Past Medical History:  Diagnosis Date   Alcohol abuse    Anemia    Anxiety    Fatty liver due to alcoholism    History of laparoscopic appendectomy 03/02/2022   Menorrhagia    Vitamin deficiency    Past Surgical History:  Procedure Laterality Date   APPENDECTOMY  03/02/2022   LAPAROSCOPIC APPENDECTOMY N/A 03/02/2022   Procedure: APPENDECTOMY LAPAROSCOPIC;  Surgeon: Donnie Mesa, MD;  Location: [facility];  Service: General;  Laterality: N/A;   Patient Active Problem List   Diagnosis Date Noted   Depression 06/09/2022   Seizure ([place]) 06/06/2022   Alcohol dependence ([place]) 06/06/2022   Intractable nausea and vomiting 05/29/2022   Hypokalemia 05/29/2022   Hypomagnesemia 05/29/2022   QT prolongation 05/29/2022   AKI (acute kidney injury) ([place]) 05/29/2022   Hypochloremia 05/29/2022   Vaginal bleeding 05/29/2022   HTN (hypertension) 05/29/2022   GERD (gastroesophageal reflux disease) 05/29/2022   Abnormal LFTs (liver function tests) 05/29/2022   Local edema 05/09/2022   Adjustment disorder with mixed anxiety and depressed mood 05/09/2022   Iron deficiency anemia due to chronic blood loss 02/11/2022   Vitamin D deficiency 10/07/2019   Transaminitis 10/07/2019   IUD (intrauterine device) in place 10/06/2019    ONSET DATE: 06/06/22  REFERRING DIAG:  Diagnosis  R56.9 (ICD-10-CM) - Seizure-like activity ([place])    THERAPY DIAG:  Muscle weakness (generalized)  Other abnormalities of gait and  mobility  Unsteadiness on feet  Attention and concentration deficit  Frontal lobe and executive function deficit  Other lack of coordination  Visuospatial deficit  Rationale for Evaluation and Treatment: Rehabilitation  SUBJECTIVE:   SUBJECTIVE STATEMENT: Denies pain Pt accompanied by: self  PERTINENT HISTORY: Pt is a 41 y.o female presented to [facility] ED following a tonic clonic seizure lasting one minute as well as an additional seizure at home and one in the ED 06/06/22. Pt with post-event confusion. 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 PMH significant for alcohol abuse, fatty liver, menorrhagia, vitamin deficiency, anemia, and appendectomy. 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 No  FALLS: Has patient fallen in last 6 months? No  LIVING ENVIRONMENT: Lives with: lives with their spouse Lives in: House/apartment Stairs: yes Has following equipment at home: shower chair  PLOF: Independent  PATIENT GOALS: To be completely independent  OBJECTIVE: (from evaluation unless otherwise noted)  HAND DOMINANCE: Right  ADLs: Overall ADLs: increased time, fall risk  Transfers/ambulation related to ADLs: Eating: independent Grooming: mod I UB Dressing: mod I LB Dressing: mod I Toileting: mod I Bathing: mod I Tub Shower transfers: supervision, recommend use of walk in  shower, has 3 in 1 commode Equipment:  3 in 1 commode  IADLs: Shopping: dependent Light housekeeping: dependent Meal Prep: dependent Community mobility: mod I Medication management: pt handles, difficulty with opening containers Financial management: pt's husband is assisting Handwriting: 100% legible, increased time and  pt reports that it is not at prior level  MOBILITY STATUS:  mod I  ACTIVITY TOLERANCE: Activity tolerance: 10-15 mins in standing prior to rest break  UPPER EXTREMITY ROM:  Bilateral UE's WFLS  Active ROM Right eval Left eval  Shoulder flexion 130 130  (Blank rows = not tested)  UPPER EXTREMITY MMT:     MMT Right eval Left eval  Shoulder flexion 3+/5 4/5  Shoulder abduction    Elbow flexion 4/5 4/5  Elbow extension 4/5 4/5  Wrist flexion    Wrist extension    (Blank rows = not tested)  HAND FUNCTION: Grip strength: Right: 39.6 lbs; Left: 40.7 lbs  COORDINATION: 9 Hole Peg test: Right: 28.82 sec; Left: 33.38 sec Tremors: Resting and ataxia  SENSATION: Light touch: Impaired   COGNITION: Overall cognitive status: decreased attention, word finding difficulties, decreased ability to organize/ sequence, cogntiion to be further addressed with in a functional context,  VISION: Subjective report: Pt reports retinal detachment with seizure Baseline vision:  new visual deficits Visual history: retinal detachment, blurry vision  VISION ASSESSMENT: To be further assessed in functional context  Patient has difficulty with following activities due to following visual impairments: sometimes reading is difficult. She bumps into items on right side   TODAY'S TREATMENT:                                                                                                                              Pt was instructed in red putty HEP for sustained grip and pinch with RUE.  Copying small peg design with RUE for fine motor coordination with a visual and cognitive component, min v.c to avoid compensation. Quadraped: for increased weightbearing and UE /core strength :cat and cow positions followed by rocking forwards and backwards, then bird dog with min facilitation at trunk due to instability. Pt was instructed not to perform at home.  PATIENT EDUCATION: Education details: red putty HEP   Person educated: Patient Education method: Consulting civil engineer, Media planner, Verbal cues, and Handouts Education comprehension: verbalized understanding, returned demonstration,   HOME EXERCISE PROGRAM: N/a   GOALS: Goals reviewed with patient? Yes potential goals discussed with pt/ spouse    LONG TERM GOALS: Target date: 08/03/22   I with HEP for coordination, grip and proximal strength(low range theraband) Baseline:  Goal status: INITIAL  2.  Pt will verbalize understanding of compensations for visual deficits. Baseline:  Goal status: INITIAL  3.  Pt will perform basic home management/ cooking with supervision demonstrating good safety awareness.. Baseline:  Goal status: INITIAL  4.  Pt will perform environmental scanning in a busy environment with 90% accuracy. Baseline:  Goal status: INITIAL  5.  Pt will demonstrate improved activity tolerance as evidenced by standing for functional/ IADL tasks for 20 mins or greater prior to rest break. Baseline:  Goal status: INITIAL  6.  Pt will verbalize understanding of compensatory strategies for tremors Baseline:  Goal status: INITIAL  ASSESSMENT:  CLINICAL IMPRESSION: Pt demonstrates progress towards goals with improving strength and coordination. PERFORMANCE DEFICITS: in functional skills including ADLs, IADLs, coordination, dexterity, sensation, ROM, strength, flexibility, Fine motor control, Gross motor control, mobility, balance, endurance, decreased knowledge of precautions, decreased knowledge of use of DME, vision, and UE functional use, cognitive skills including attention, learn, memory, problem solving, safety awareness, sequencing, thought, and understand, and psychosocial skills including coping strategies, environmental adaptation, habits, interpersonal interactions, and routines and behaviors.   IMPAIRMENTS: are limiting patient from ADLs, IADLs, work, leisure, and social participation.   CO-MORBIDITIES: may have  co-morbidities  that affects occupational performance. Patient will benefit from skilled OT to address above impairments and improve overall function.  REHAB POTENTIAL: Good  PLAN:  OT FREQUENCY: 2x/week  OT DURATION: 4 weeks plus eval  PLANNED INTERVENTIONS: self care/ADL training, therapeutic exercise, therapeutic activity, neuromuscular re-education, manual therapy, passive range of motion, balance training, paraffin, fluidotherapy, moist heat, cryotherapy, contrast bath, patient/family education, cognitive remediation/compensation, visual/perceptual remediation/compensation, energy conservation, coping strategies training, DME and/or AE instructions, and Re-evaluation  RECOMMENDED OTHER SERVICES: ST for cognition/ word finding  CONSULTED AND AGREED WITH PLAN OF CARE: Patient and family member/caregiver  PLAN FOR NEXT SESSION:  environmental scanning, and visual compensation strategies;  consider low range theraband in door and  weightbearing activities pt will d/c prior to surgery on 08/08/22  Milayna Rotenberg, OT 07/17/2022, 7:46 AM

## 2022-07-17 NOTE — Therapy (Signed)
OUTPATIENT PHYSICAL THERAPY NEURO TREATMENT NOTE   Patient Name: Janice Arnold MRN: [id number] DOB:02/03/1981, 41 y.o., female Today's Date: 07/17/2022   PCP: Tawnya Crook, MD REFERRING PROVIDER: Tawni Millers, MD  END OF SESSION:  PT End of Session - 07/17/22 0802     Visit Number 3    Number of Visits 9   with eval   Date for PT Re-Evaluation 08/20/22   to allow for scheduling conflicts   Authorization Type [employer]    PT Start Time 0800    PT Stop Time 0844    PT Time Calculation (min) 44 min    Equipment Utilized During Treatment Gait belt    Activity Tolerance Patient tolerated treatment well    Behavior During Therapy [facility] for tasks assessed/performed;Flat affect               Past Medical History:  Diagnosis Date   Alcohol abuse    Anemia    Anxiety    Fatty liver due to alcoholism    History of laparoscopic appendectomy 03/02/2022   Menorrhagia    Vitamin deficiency    Past Surgical History:  Procedure Laterality Date   APPENDECTOMY  03/02/2022   LAPAROSCOPIC APPENDECTOMY N/A 03/02/2022   Procedure: APPENDECTOMY LAPAROSCOPIC;  Surgeon: Donnie Mesa, MD;  Location: [facility];  Service: General;  Laterality: N/A;   Patient Active Problem List   Diagnosis Date Noted   Depression 06/09/2022   Seizure ([place]) 06/06/2022   Alcohol dependence ([place]) 06/06/2022   Intractable nausea and vomiting 05/29/2022   Hypokalemia 05/29/2022   Hypomagnesemia 05/29/2022   QT prolongation 05/29/2022   AKI (acute kidney injury) ([place]) 05/29/2022   Hypochloremia 05/29/2022   Vaginal bleeding 05/29/2022   HTN (hypertension) 05/29/2022   GERD (gastroesophageal reflux disease) 05/29/2022   Abnormal LFTs (liver function tests) 05/29/2022   Local edema 05/09/2022   Adjustment disorder with mixed anxiety and depressed mood 05/09/2022   Iron deficiency anemia due to chronic blood loss 02/11/2022   Vitamin D deficiency 10/07/2019   Transaminitis 10/07/2019   IUD  (intrauterine device) in place 10/06/2019    ONSET DATE: 06/09/2022  REFERRING DIAG: R56.9 (ICD-10-CM) - Seizure-like activity ([place])  THERAPY DIAG:  Muscle weakness (generalized)  Other abnormalities of gait and mobility  Unsteadiness on feet  Rationale for Evaluation and Treatment: Rehabilitation  SUBJECTIVE:                                                                                                                                                                                             SUBJECTIVE STATEMENT: Pt reports going to [employer]  this weekend and had to walk back and forth to bathrooms several times in the rain and up/down steps. Had no issues with balance but took things slow. Requesting a green band for HEP.   Pt accompanied by: self  PERTINENT HISTORY: B12 deficiency, QT prolongation, Abnormal LFTs (liver function tests), Hypokalemia, Adjustment disorder with mixed anxiety and depressed mood, Seizure, Nausea and vomiting, unspecified vomiting type, Tremor  PAIN:  Are you having pain? No  PRECAUTIONS: Fall  WEIGHT BEARING RESTRICTIONS: No  FALLS: Has patient fallen in last 6 months? No  LIVING ENVIRONMENT: Lives with: lives with their family (husband, kids-12,15,20) Lives in: House/apartment Stairs: Yes: Internal: 12 steps; on right going up Has following equipment at home: shower chair  PLOF: Independent with gait, Independent with transfers, and was not able to do weekly grocery shopping but could run into the store for an item; now can't stand longer than 5 min  PATIENT GOALS: "be back to myself" "be able to stand up"; was working from home but is on leave right now  OBJECTIVE:   TODAY'S TREATMENT:                                                                                                                         Ther Ex: SciFit multi-peaks level 8 for 8 minutes using BUE/BLEs for neural priming for reciprocal movement, dynamic  cardiovascular conditioning and global strengthening.   NMR  In // bars for improved LE step clearance, BLE strength and single leg stability:  -Fwd hurdle navigation using 4" hurdles and step-to pattern without UE support. Progressed to 6" hurdles after single rep due to 4" being too easy. Pt performed 2 reps (down and back) using step-to pattern and 2 reps using step through pattern. Noted decreased step clearance of RLE> LLE.  -Eccentric heel taps from 8" box in fwd and lateral directions, x12 each direction w/heavy BUE support. Pt demonstrated poor eccentric control in fwd direction and significant difficulty stepping back up onto box without pushing up w/hands. RPE of 5/10 (pt stated it would be 10/10 without UE support). Added to HEP verbally (pt declined handout)  -Alt step ups w/contralateral march on airex, x8 per side, w/8-10s isometric hold and no UE support. Noted good utilization of ankle strategy throughout and increased difficulty standing on RLE > LLE.  -Standing march on airex w/5# KB OH hold, 2x10 per side, for improved core stability and single leg stance. Pt performed well w/single LOB posterolaterally to R side.  -Double cone taps on airex, x10 per side without UE support. No LOB noted.     PATIENT EDUCATION: Education details: Updates to HEP Person educated: Patient Education method: Theatre stage manager Education comprehension: verbalized understanding and returned demonstration  HOME EXERCISE PROGRAM: Access Code: [email or web address] URL: [email or web address] Date: 07/11/2022 Prepared by: Excell Seltzer  Exercises- provided green theraband on 12/6 - Sit to Stand with Resistance Around Legs  - 1 x daily - 7 x weekly - 3 sets -  10 reps - Side Stepping with Resistance at Ankles  - 1 x daily - 7 x weekly - 3 sets - 10 reps - Forward Monster Walk with Resistance at Ankles and Counter Support  - 1 x daily - 7 x weekly - 3 sets - 10 reps -Eccentric heel taps added  verbally on 12/6    GOALS: Goals reviewed with patient? Yes  SHORT TERM GOALS=LONG TERM GOALS due to length of POC   LONG TERM GOALS: Target date: 08/09/2022  Pt will be independent with final HEP for improved strength, balance, transfers and gait. Baseline:  Goal status: INITIAL  2.  Pt will improve gait velocity to at least 3.0 ft/sec for improved gait efficiency and performance at mod I level  Baseline: 2.38 ft/sec (11/28) Goal status: INITIAL  3.  Pt will improve FGA to 20/30 for decreased fall risk  Baseline: 14/30 (11/28) Goal status: INITIAL  4.  Pt will ambulate greater than or equal to 1000 feet on 6MWT with LRAD and mod I for improved cardiovascular endurance and BLE strength.  Baseline: 43 ft with no AD, mod I (11/28) Goal status: INITIAL  5.  Pt will be able to tolerate standing x 10 min while performing functional task to demonstrate improved ability to complete functional activities Baseline: 6 minutes (11/28) Goal status: INITIAL   ASSESSMENT:  CLINICAL IMPRESSION: Emphasis of skilled PT session on global strengthening, single leg stability and endurance. Pt very challenged by eccentric tasks, relying heavily on UE support to perform. Pt performed well on unlevel surfaces but noted increased difficulty w/balance on RLE > LLE. Continue POC.     OBJECTIVE IMPAIRMENTS: cardiopulmonary status limiting activity, decreased activity tolerance, decreased balance, decreased cognition, decreased endurance, decreased knowledge of condition, decreased strength, dizziness, impaired perceived functional ability, and impaired sensation.   ACTIVITY LIMITATIONS: carrying, lifting, bending, standing, squatting, stairs, bathing, and hygiene/grooming  PARTICIPATION LIMITATIONS: meal prep, cleaning, laundry, interpersonal relationship, driving, shopping, community activity, and occupation  PERSONAL FACTORS: B12 deficiency, QT prolongation, Abnormal LFTs (liver function tests),  Hypokalemia, Adjustment disorder with mixed anxiety and depressed mood, Seizure, Nausea and vomiting, unspecified vomiting type, Tremor are also affecting patient's functional outcome.   REHAB POTENTIAL: Good  CLINICAL DECISION MAKING: Stable/uncomplicated  EVALUATION COMPLEXITY: Low  PLAN:  PT FREQUENCY: 2x/week  PT DURATION:  9 total sessions (with eval)  PLANNED INTERVENTIONS: Therapeutic exercises, Therapeutic activity, Neuromuscular re-education, Balance training, Gait training, Patient/Family education, Self Care, Joint mobilization, Stair training, Vestibular training, Canalith repositioning, Visual/preceptual remediation/compensation, DME instructions, Dry Needling, Cognitive remediation, Cryotherapy, Moist heat, Manual therapy, and Re-evaluation  PLAN FOR NEXT SESSION: add to HEP for global strengthening and endurance (squats, walking program, step-ups), assess vestibular system?, progress to step-ups from step-taps, compliant surfaces   Janice Arnold, PT, DPT 07/17/2022, 8:46 AM

## 2022-07-17 NOTE — Patient Instructions (Addendum)
1. Grip Strengthening (Resistive Putty)   Squeeze putty using thumb and all fingers. Repeat _20___ times. Do __2__ sessions per day.   2. Roll putty into tube on table and pinch between first two fingers and thumb x 10 reps. Do 2 sessions per day    Compensation Strategies for Tremors  When eating, try the following Eat out of bowls, divided plates, or use a plate guard (available at a medical supply store) and eat with a spoon so that you have an edge to scoop up food. Try raising your plate so that there is less distance between the plate and mouth.Try stabilizing elbows on the tables or against your body. Use utensil with built-up/larger grips as they are easier to hold.  When writing, try the following: Stabilize forearm on the table. Take your time as rushing/being stressed can increase tremors. Try a felt-tipped pen, it does not glide as much.  Avoid gel pens ( they move to much ). Consider using pre-printed labels with your name and address (carry them with you when you go out) or you can get stamps with your address or signature on it. Use a small tape recorder to record messages/reminders for yourself. Use pens with bigger grips.  When brushing your teeth, putting on make-up, or styling hair, try the following: Use an electric toothbrush. Use items with built-up grips. Stabilize your elbows against your body or on the counter. Use long-handled brushes/combs. Use a hair dryer with a stand.  In general: Avoid stress, fatigue or rushing as this can increase tremors. Sit down for activities that require more control/coordination. Perform "flicks".      Copyright  VHI. All rights reserved.

## 2022-07-18 ENCOUNTER — Ambulatory Visit: Payer: Managed Care, Other (non HMO) | Admitting: Gastroenterology

## 2022-07-19 ENCOUNTER — Inpatient Hospital Stay: Payer: Managed Care, Other (non HMO) | Attending: Hematology & Oncology

## 2022-07-19 ENCOUNTER — Inpatient Hospital Stay: Payer: Managed Care, Other (non HMO) | Admitting: Family

## 2022-07-22 NOTE — Therapy (Deleted)
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    History of laparoscopic appendectomy 03/02/2022   Menorrhagia    Vitamin deficiency    Past Surgical History:  Procedure Laterality Date   APPENDECTOMY  03/02/2022   LAPAROSCOPIC APPENDECTOMY N/A 03/02/2022   Procedure: APPENDECTOMY LAPAROSCOPIC;  Surgeon: Donnie Mesa, MD;  Location: [facility];  Service: General;  Laterality: N/A;   Patient Active Problem List   Diagnosis Date Noted   Depression 06/09/2022   Seizure ([place]) 06/06/2022   Alcohol dependence ([place]) 06/06/2022   Intractable nausea and vomiting 05/29/2022   Hypokalemia 05/29/2022   Hypomagnesemia 05/29/2022   QT prolongation 05/29/2022   AKI (acute kidney injury) ([place]) 05/29/2022   Hypochloremia 05/29/2022   Vaginal bleeding 05/29/2022   HTN (hypertension) 05/29/2022   GERD (gastroesophageal reflux disease) 05/29/2022   Abnormal LFTs (liver function tests) 05/29/2022   Local edema 05/09/2022   Adjustment disorder with mixed anxiety and depressed mood 05/09/2022   Iron deficiency anemia due to chronic blood loss 02/11/2022   Vitamin D deficiency 10/07/2019   Transaminitis 10/07/2019   IUD (intrauterine device) in place 10/06/2019    ONSET DATE: referral 07/15/2022  REFERRING DIAG: R56.9 (ICD-10-CM) - Seizure ([place])   THERAPY DIAG:  No diagnosis found.  Rationale for Evaluation and Treatment: Rehabilitation  SUBJECTIVE:   SUBJECTIVE STATEMENT: *** Pt accompanied by: {accompnied:27141}  PERTINENT HISTORY: B12 deficiency, QT prolongation, Abnormal LFTs (liver function tests), Hypokalemia, Adjustment disorder with mixed anxiety and depressed mood,  Seizure, Nausea and vomiting, unspecified vomiting type, Tremor   PAIN:  Are you having pain? {OPRCPAIN:27236}  FALLS: Has patient fallen in last 6 months?  {PPJKDTOI:71245}  LIVING ENVIRONMENT: Lives with: {OPRC lives with:25569::"lives with their family"} Lives in: {Lives in:25570}  PLOF:  Level of assistance: {YKDXIPJ:82505} Employment: {SLPemployment:25674}  PATIENT GOALS: ***  OBJECTIVE:   DIAGNOSTIC FINDINGS: Head CT 06/06/22 1. 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 No acute intracranial hemorrhage  COGNITION: Overall cognitive status: {cognition:24006} Areas of impairment:  {cognitiveimpairmentslp:27409} Functional deficits: ***  COGNITIVE COMMUNICATION: Auditory comprehension: {WFL-Impaired:25365} YES/NO questions: {IMPAIRED:25374} Following directions: {IMPAIRED:25374} Conversation: {SLP conversation:25430} Verbal expression: {WFL-Impaired:25365} Naming: {SLPnaming:27214} Pragmatics: {slppragmatics:27216} Discourse: Interfering components: {SLP INTERFERING COMPONENTS:25436} Reading Comprehension: {SLPreadingcomprehension:27140} Functional communication: {WFL-Impaired:25365}  ORAL MOTOR EXAMINATION: Overall status: {OMESLP2:27645} Comments: ***  STANDARDIZED ASSESSMENTS: {SLPstandardizedassessment:27092}  PATIENT REPORTED OUTCOME MEASURES (PROM): {SLPPROM:27095}   TODAY'S TREATMENT: 07-23-22: ***   PATIENT EDUCATION: Education details: *** Person educated: {Person educated:25204} Education method: {Education Method:25205} Education comprehension: {Education Comprehension:25206}   GOALS: Goals reviewed with patient? {yes/no:20286}  SHORT TERM GOALS: Target date: ***  *** Baseline: Goal status: {GOALSTATUS:25110}  2.  *** Baseline:  Goal status: {GOALSTATUS:25110}  3.  *** Baseline:  Goal status:  {GOALSTATUS:25110}  4.  *** Baseline:  Goal status: {GOALSTATUS:25110}  5.  *** Baseline:  Goal status: {GOALSTATUS:25110}  6.  *** Baseline:  Goal status: {GOALSTATUS:25110}  LONG TERM GOALS: Target date: ***  *** Baseline:  Goal status: {GOALSTATUS:25110}  2.  *** Baseline:  Goal status: {GOALSTATUS:25110}  3.  *** Baseline:  Goal status: {GOALSTATUS:25110}  4.  ***  Baseline:  Goal status: {GOALSTATUS:25110}  5.  *** Baseline:  Goal status: {GOALSTATUS:25110}  6.  *** Baseline:  Goal status: {GOALSTATUS:25110}  ASSESSMENT:  CLINICAL IMPRESSION: Patient is a *** y.o. *** who was seen today for ***.   OBJECTIVE IMPAIRMENTS: include {SLPOBJIMP:27107}. These impairments are limiting patient from {SLPLIMIT:27108}. Factors affecting potential to achieve goals and functional outcome are {SLP factors:25450}.. Patient will benefit from skilled SLP services to address above impairments and improve overall function.  REHAB POTENTIAL: {rehabpotential:25112}  PLAN:  SLP FREQUENCY: {rehab frequency:25116}  SLP DURATION: {rehab duration:25117}  PLANNED INTERVENTIONS: {SLP treatment/interventions:25449}    HETTY LINHART, CCC-SLP 07/22/2022, 10:38 AM

## 2022-07-23 ENCOUNTER — Ambulatory Visit: Payer: Managed Care, Other (non HMO) | Admitting: Physical Therapy

## 2022-07-23 ENCOUNTER — Telehealth: Payer: Self-pay | Admitting: Physical Therapy

## 2022-07-23 ENCOUNTER — Ambulatory Visit: Payer: Managed Care, Other (non HMO) | Admitting: Speech Pathology

## 2022-07-23 ENCOUNTER — Ambulatory Visit: Payer: Managed Care, Other (non HMO) | Admitting: Occupational Therapy

## 2022-07-23 NOTE — Telephone Encounter (Signed)
Patient was a no show for appointment today on 12/12. Called patient to remind her of upcoming apt. on 12/14 at 9:30 as well as 3 visit no show policy. LVM.   Malachi Carl, PT, DPT

## 2022-07-23 NOTE — Therapy (Deleted)
OUTPATIENT OCCUPATIONAL THERAPY NEURO TREATMENT  Patient Name: Janice Arnold MRN: [id number] DOB:[birth year], 41 y.o., female Today's Date: 07/23/2022  PCP: Dr. Cherlynn Kaiser REFERRING PROVIDER: Dr. Cathlean Sauer  END OF SESSION:     Past Medical History:  Diagnosis Date   Alcohol abuse    Anemia    Anxiety    Fatty liver due to alcoholism    History of laparoscopic appendectomy 03/02/2022   Menorrhagia    Vitamin deficiency    Past Surgical History:  Procedure Laterality Date   APPENDECTOMY  03/02/2022   LAPAROSCOPIC APPENDECTOMY N/A 03/02/2022   Procedure: APPENDECTOMY LAPAROSCOPIC;  Surgeon: Donnie Mesa, MD;  Location: [facility];  Service: General;  Laterality: N/A;   Patient Active Problem List   Diagnosis Date Noted   Depression 06/09/2022   Seizure ([place]) 06/06/2022   Alcohol dependence ([place]) 06/06/2022   Intractable nausea and vomiting 05/29/2022   Hypokalemia 05/29/2022   Hypomagnesemia 05/29/2022   QT prolongation 05/29/2022   AKI (acute kidney injury) ([place]) 05/29/2022   Hypochloremia 05/29/2022   Vaginal bleeding 05/29/2022   HTN (hypertension) 05/29/2022   GERD (gastroesophageal reflux disease) 05/29/2022   Abnormal LFTs (liver function tests) 05/29/2022   Local edema 05/09/2022   Adjustment disorder with mixed anxiety and depressed mood 05/09/2022   Iron deficiency anemia due to chronic blood loss 02/11/2022   Vitamin D deficiency 10/07/2019   Transaminitis 10/07/2019   IUD (intrauterine device) in place 10/06/2019    ONSET DATE: 06/06/22  REFERRING DIAG:  Diagnosis  R56.9 (ICD-10-CM) - Seizure-like activity ([place])    THERAPY DIAG:  No diagnosis found.  Rationale for Evaluation and Treatment: Rehabilitation  SUBJECTIVE:   SUBJECTIVE STATEMENT: Denies pain Pt accompanied by: self  PERTINENT HISTORY: Pt is a 41 y.o female presented to [facility] ED following a tonic clonic seizure lasting one minute as well as an additional seizure at home and one in the ED  06/06/22. Pt with post-event confusion. 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 PMH significant for alcohol abuse, fatty liver, menorrhagia, vitamin deficiency, anemia, and appendectomy. 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 No  FALLS: Has patient fallen in last 6 months? No  LIVING ENVIRONMENT: Lives with: lives with their spouse Lives in: House/apartment Stairs: yes Has following equipment at home: shower chair  PLOF: Independent  PATIENT GOALS: To be completely independent  OBJECTIVE: (from evaluation unless otherwise noted)  HAND DOMINANCE: Right  ADLs: Overall ADLs: increased time, fall risk  Transfers/ambulation related to ADLs: Eating: independent Grooming: mod I UB Dressing: mod I LB Dressing: mod I Toileting: mod I Bathing: mod I Tub Shower transfers: supervision, recommend use of walk in shower, has 3 in 1 commode Equipment:  3 in 1 commode  IADLs: Shopping: dependent Light housekeeping: dependent Meal Prep: dependent Community mobility: mod I Medication management: pt handles, difficulty with opening containers Financial management: pt's husband is assisting Handwriting: 100% legible, increased time and pt reports that it is not at prior level  MOBILITY STATUS:  mod I  ACTIVITY TOLERANCE: Activity tolerance: 10-15 mins in standing prior to rest break  UPPER EXTREMITY ROM:  Bilateral UE's WFLS  Active ROM Right eval Left eval  Shoulder flexion 130 130  (Blank rows = not tested)  UPPER EXTREMITY MMT:     MMT Right eval Left eval  Shoulder flexion 3+/5 4/5  Shoulder abduction    Elbow flexion 4/5 4/5  Elbow extension 4/5 4/5  Wrist flexion    Wrist extension     (Blank rows = not tested)  HAND FUNCTION: Grip strength: Right: 39.6 lbs; Left: 40.7 lbs  COORDINATION: 9 Hole Peg test: Right: 28.82 sec; Left: 33.38 sec Tremors: Resting and ataxia  SENSATION: Light touch: Impaired   COGNITION: Overall cognitive status: decreased attention, word finding difficulties, decreased ability to organize/ sequence, cogntiion to be further addressed with in a functional context,  VISION: Subjective report: Pt reports retinal detachment with seizure Baseline vision:  new visual deficits Visual history: retinal detachment, blurry vision  VISION ASSESSMENT: To be further assessed in functional context  Patient has difficulty with following activities due to following visual impairments: sometimes reading is difficult. She bumps into items on right side   TODAY'S TREATMENT:                                                                                                                              Pt was instructed in red putty HEP for sustained grip and pinch with RUE.  Copying small peg design with RUE for fine motor coordination with a visual and cognitive component, min v.c to avoid compensation. Quadraped: for increased weightbearing and UE /core strength :cat and cow positions followed by rocking forwards and backwards, then bird dog with min facilitation at trunk due to instability. Pt was instructed not to perform at home.  PATIENT EDUCATION: Education details: red putty HEP  Person educated: Patient Education method: Consulting civil engineer, Media planner, Verbal cues, and Handouts Education comprehension: verbalized understanding, returned demonstration,   HOME EXERCISE PROGRAM: N/a   GOALS: Goals reviewed with patient? Yes potential goals discussed with pt/ spouse    LONG TERM GOALS: Target date: 08/03/22   I with HEP for coordination, grip and proximal strength(low range theraband) Baseline:  Goal status: INITIAL  2.  Pt will verbalize  understanding of compensations for visual deficits. Baseline:  Goal status: INITIAL  3.  Pt will perform basic home management/ cooking with supervision demonstrating good safety awareness.. Baseline:  Goal status: INITIAL  4.  Pt will perform environmental scanning in a busy environment with 90% accuracy. Baseline:  Goal status: INITIAL  5.  Pt will demonstrate improved activity tolerance as evidenced by standing for functional/ IADL tasks for 20 mins or greater prior to rest break. Baseline:  Goal status: INITIAL  6.  Pt will verbalize understanding of compensatory strategies for tremors Baseline:  Goal status: INITIAL  ASSESSMENT:  CLINICAL IMPRESSION: Pt demonstrates progress towards goals with improving strength and coordination. PERFORMANCE DEFICITS: in functional skills including ADLs, IADLs, coordination, dexterity, sensation, ROM, strength, flexibility, Fine motor control, Gross motor control, mobility, balance, endurance, decreased knowledge of precautions, decreased knowledge of use of DME, vision, and UE functional use, cognitive skills including  attention, learn, memory, problem solving, safety awareness, sequencing, thought, and understand, and psychosocial skills including coping strategies, environmental adaptation, habits, interpersonal interactions, and routines and behaviors.   IMPAIRMENTS: are limiting patient from ADLs, IADLs, work, leisure, and social participation.   CO-MORBIDITIES: may have co-morbidities  that affects occupational performance. Patient will benefit from skilled OT to address above impairments and improve overall function.  REHAB POTENTIAL: Good  PLAN:  OT FREQUENCY: 2x/week  OT DURATION: 4 weeks plus eval  PLANNED INTERVENTIONS: self care/ADL training, therapeutic exercise, therapeutic activity, neuromuscular re-education, manual therapy, passive range of motion, balance training, paraffin, fluidotherapy, moist heat, cryotherapy, contrast  bath, patient/family education, cognitive remediation/compensation, visual/perceptual remediation/compensation, energy conservation, coping strategies training, DME and/or AE instructions, and Re-evaluation  RECOMMENDED OTHER SERVICES: ST for cognition/ word finding  CONSULTED AND AGREED WITH PLAN OF CARE: Patient and family member/caregiver  PLAN FOR NEXT SESSION:  environmental scanning, and visual compensation strategies;  consider low range theraband in door and  weightbearing activities pt will d/c prior to surgery on 08/08/22  Dennis Bast, OT 07/23/2022, 11:39 AM

## 2022-07-25 ENCOUNTER — Telehealth: Payer: Self-pay | Admitting: Physical Therapy

## 2022-07-25 ENCOUNTER — Ambulatory Visit: Payer: Managed Care, Other (non HMO) | Admitting: Physical Therapy

## 2022-07-25 ENCOUNTER — Ambulatory Visit: Payer: Managed Care, Other (non HMO) | Admitting: Occupational Therapy

## 2022-07-25 NOTE — Telephone Encounter (Signed)
Called and LVM pertaining to no show to physical therapy appointment today. Reminded of Tuesday, December 19th appointment and 3 no-show policy. This was second patient no show so would be a discharge for next no show.   Malachi Carl, PT, DPT

## 2022-07-27 ENCOUNTER — Encounter: Payer: Self-pay | Admitting: Cardiovascular Disease

## 2022-07-29 ENCOUNTER — Other Ambulatory Visit: Payer: Self-pay | Admitting: Family Medicine

## 2022-07-29 MED ORDER — VITAMIN D (ERGOCALCIFEROL) 1.25 MG (50000 UNIT) PO CAPS: 50000.0000 [IU] | ORAL_CAPSULE | ORAL | 0 refills | Status: DC

## 2022-07-29 MED ORDER — PROMETHAZINE HCL 25 MG PO TABS: 25.0000 mg | ORAL_TABLET | Freq: Three times a day (TID) | ORAL | 0 refills | Status: DC | PRN

## 2022-07-30 ENCOUNTER — Telehealth: Payer: Self-pay | Admitting: Physical Therapy

## 2022-07-30 ENCOUNTER — Encounter (HOSPITAL_BASED_OUTPATIENT_CLINIC_OR_DEPARTMENT_OTHER): Payer: Self-pay | Admitting: Obstetrics and Gynecology

## 2022-07-30 ENCOUNTER — Encounter: Payer: Self-pay | Admitting: Occupational Therapy

## 2022-07-30 ENCOUNTER — Ambulatory Visit: Payer: Managed Care, Other (non HMO) | Admitting: Occupational Therapy

## 2022-07-30 ENCOUNTER — Ambulatory Visit: Payer: Managed Care, Other (non HMO) | Admitting: Physical Therapy

## 2022-07-30 ENCOUNTER — Encounter: Payer: Self-pay | Admitting: Physical Therapy

## 2022-07-30 NOTE — Therapy (Deleted)
OUTPATIENT OCCUPATIONAL THERAPY NEURO TREATMENT  Patient Name: Janice Arnold MRN: [id number] DOB:[birth year], 41 y.o., female Today's Date: 07/30/2022  PCP: Dr. Cherlynn Kaiser REFERRING PROVIDER: Dr. Cathlean Sauer  END OF SESSION:     Past Medical History:  Diagnosis Date   Alcohol abuse    Anemia    Anxiety    Fatty liver due to alcoholism    History of laparoscopic appendectomy 03/02/2022   Menorrhagia    Vitamin deficiency    Past Surgical History:  Procedure Laterality Date   APPENDECTOMY  03/02/2022   LAPAROSCOPIC APPENDECTOMY N/A 03/02/2022   Procedure: APPENDECTOMY LAPAROSCOPIC;  Surgeon: Donnie Mesa, MD;  Location: [facility];  Service: General;  Laterality: N/A;   Patient Active Problem List   Diagnosis Date Noted   Depression 06/09/2022   Seizure ([place]) 06/06/2022   Alcohol dependence ([place]) 06/06/2022   Intractable nausea and vomiting 05/29/2022   Hypokalemia 05/29/2022   Hypomagnesemia 05/29/2022   QT prolongation 05/29/2022   AKI (acute kidney injury) ([place]) 05/29/2022   Hypochloremia 05/29/2022   Vaginal bleeding 05/29/2022   HTN (hypertension) 05/29/2022   GERD (gastroesophageal reflux disease) 05/29/2022   Abnormal LFTs (liver function tests) 05/29/2022   Local edema 05/09/2022   Adjustment disorder with mixed anxiety and depressed mood 05/09/2022   Iron deficiency anemia due to chronic blood loss 02/11/2022   Vitamin D deficiency 10/07/2019   Transaminitis 10/07/2019   IUD (intrauterine device) in place 10/06/2019    ONSET DATE: 06/06/22  REFERRING DIAG:  Diagnosis  R56.9 (ICD-10-CM) - Seizure-like activity ([place])    THERAPY DIAG:  No diagnosis found.  Rationale for Evaluation and Treatment: Rehabilitation  SUBJECTIVE:   SUBJECTIVE STATEMENT: Denies pain Pt accompanied by: self  PERTINENT HISTORY: Pt is a 41 y.o female presented to [facility] ED following a tonic clonic seizure lasting one minute as well as an additional seizure at home and one in the ED  06/06/22. Pt with post-event confusion. 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 PMH significant for alcohol abuse, fatty liver, menorrhagia, vitamin deficiency, anemia, and appendectomy. 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 No  FALLS: Has patient fallen in last 6 months? No  LIVING ENVIRONMENT: Lives with: lives with their spouse Lives in: House/apartment Stairs: yes Has following equipment at home: shower chair  PLOF: Independent  PATIENT GOALS: To be completely independent  OBJECTIVE: (from evaluation unless otherwise noted)  HAND DOMINANCE: Right  ADLs: Overall ADLs: increased time, fall risk  Transfers/ambulation related to ADLs: Eating: independent Grooming: mod I UB Dressing: mod I LB Dressing: mod I Toileting: mod I Bathing: mod I Tub Shower transfers: supervision, recommend use of walk in shower, has 3 in 1 commode Equipment:  3 in 1 commode  IADLs: Shopping: dependent Light housekeeping: dependent Meal Prep: dependent Community mobility: mod I Medication management: pt handles, difficulty with opening containers Financial management: pt's husband is assisting Handwriting: 100% legible, increased time and pt reports that it is not at prior level  MOBILITY STATUS:  mod I  ACTIVITY TOLERANCE: Activity tolerance: 10-15 mins in standing prior to rest break  UPPER EXTREMITY ROM:  Bilateral UE's WFLS  Active ROM Right eval Left eval  Shoulder flexion 130 130  (Blank rows = not tested)  UPPER EXTREMITY MMT:     MMT Right eval Left eval  Shoulder flexion 3+/5 4/5  Shoulder abduction    Elbow flexion 4/5 4/5  Elbow extension 4/5 4/5  Wrist flexion    Wrist extension     (Blank rows = not tested)  HAND FUNCTION: Grip strength: Right: 39.6 lbs; Left: 40.7 lbs  COORDINATION: 9 Hole Peg test: Right: 28.82 sec; Left: 33.38 sec Tremors: Resting and ataxia  SENSATION: Light touch: Impaired   COGNITION: Overall cognitive status: decreased attention, word finding difficulties, decreased ability to organize/ sequence, cogntiion to be further addressed with in a functional context,  VISION: Subjective report: Pt reports retinal detachment with seizure Baseline vision:  new visual deficits Visual history: retinal detachment, blurry vision  VISION ASSESSMENT: To be further assessed in functional context  Patient has difficulty with following activities due to following visual impairments: sometimes reading is difficult. She bumps into items on right side   TODAY'S TREATMENT:                                                                                                                              Pt was instructed in red putty HEP for sustained grip and pinch with RUE.  Copying small peg design with RUE for fine motor coordination with a visual and cognitive component, min v.c to avoid compensation. Quadraped: for increased weightbearing and UE /core strength :cat and cow positions followed by rocking forwards and backwards, then bird dog with min facilitation at trunk due to instability. Pt was instructed not to perform at home.  PATIENT EDUCATION: Education details: red putty HEP  Person educated: Patient Education method: Consulting civil engineer, Media planner, Verbal cues, and Handouts Education comprehension: verbalized understanding, returned demonstration,   HOME EXERCISE PROGRAM: N/a   GOALS: Goals reviewed with patient? Yes potential goals discussed with pt/ spouse    LONG TERM GOALS: Target date: 08/03/22   I with HEP for coordination, grip and proximal strength(low range theraband) Baseline:  Goal status: INITIAL  2.  Pt will verbalize  understanding of compensations for visual deficits. Baseline:  Goal status: INITIAL  3.  Pt will perform basic home management/ cooking with supervision demonstrating good safety awareness.. Baseline:  Goal status: INITIAL  4.  Pt will perform environmental scanning in a busy environment with 90% accuracy. Baseline:  Goal status: INITIAL  5.  Pt will demonstrate improved activity tolerance as evidenced by standing for functional/ IADL tasks for 20 mins or greater prior to rest break. Baseline:  Goal status: INITIAL  6.  Pt will verbalize understanding of compensatory strategies for tremors Baseline:  Goal status: INITIAL  ASSESSMENT:  CLINICAL IMPRESSION: Pt demonstrates progress towards goals with improving strength and coordination. PERFORMANCE DEFICITS: in functional skills including ADLs, IADLs, coordination, dexterity, sensation, ROM, strength, flexibility, Fine motor control, Gross motor control, mobility, balance, endurance, decreased knowledge of precautions, decreased knowledge of use of DME, vision, and UE functional use, cognitive skills including  attention, learn, memory, problem solving, safety awareness, sequencing, thought, and understand, and psychosocial skills including coping strategies, environmental adaptation, habits, interpersonal interactions, and routines and behaviors.   IMPAIRMENTS: are limiting patient from ADLs, IADLs, work, leisure, and social participation.   CO-MORBIDITIES: may have co-morbidities  that affects occupational performance. Patient will benefit from skilled OT to address above impairments and improve overall function.  REHAB POTENTIAL: Good  PLAN:  OT FREQUENCY: 2x/week  OT DURATION: 4 weeks plus eval  PLANNED INTERVENTIONS: self care/ADL training, therapeutic exercise, therapeutic activity, neuromuscular re-education, manual therapy, passive range of motion, balance training, paraffin, fluidotherapy, moist heat, cryotherapy, contrast  bath, patient/family education, cognitive remediation/compensation, visual/perceptual remediation/compensation, energy conservation, coping strategies training, DME and/or AE instructions, and Re-evaluation  RECOMMENDED OTHER SERVICES: ST for cognition/ word finding  CONSULTED AND AGREED WITH PLAN OF CARE: Patient and family member/caregiver  PLAN FOR NEXT SESSION:  environmental scanning, and visual compensation strategies;  consider low range theraband in door and  weightbearing activities pt will d/c prior to surgery on 08/08/22  Winner Valeriano, OT 07/30/2022, 7:24 AM

## 2022-07-30 NOTE — Therapy (Signed)
[facility] [address] [place], [place], [address] Phone: [phone]   Fax:  [phone]  Patient Details  Name: Janice Arnold MRN: [id number] Date of Birth: [birth year] Referring Provider:  No ref. provider found  Encounter Date: 07/30/2022 OCCUPATIONAL THERAPY DISCHARGE SUMMARY  Visits from Start of Care: 3  Current functional level related to goals / functional outcomes: Unable to be assessed as pt did not return.   Remaining deficits: Decreased coordination, decreased strength, visual deficits   Education / Equipment: Pt was instructed in : putty, coordination and visual HEP's.  Pt verbalized understanding of all education.  Patient agrees to discharge. Patient goals were not met. Patient is being discharged due to not returning since the last visit.Marland Kitchen     Kaspar Albornoz, OT 07/30/2022, 1:44 PM  [facility] [address] [place], [place], [address] Phone: [phone]   Fax:  [phone]

## 2022-07-30 NOTE — Therapy (Signed)
[facility] [address] [place], [place], [address] Phone: [phone]   Fax:  [phone]  Patient Details  Name: [name] MRN: [id number] Date of Birth: 1981-07-21 Referring Provider:  No ref. provider found  Encounter Date: 07/30/2022  Patient discharged from PT services after 3 No Call/No Shows per department policy. Patient will need to obtain a new referral to resume PT services.   Excell Seltzer, PT, DPT, CSRS 07/30/2022, 11:36 AM  [facility] [address] [place], [place], [address] Phone: [phone]   Fax:  [phone]

## 2022-07-31 ENCOUNTER — Encounter (HOSPITAL_BASED_OUTPATIENT_CLINIC_OR_DEPARTMENT_OTHER): Payer: Self-pay | Admitting: Obstetrics and Gynecology

## 2022-08-01 ENCOUNTER — Ambulatory Visit: Payer: Managed Care, Other (non HMO) | Admitting: Physical Therapy

## 2022-08-01 ENCOUNTER — Ambulatory Visit: Payer: Managed Care, Other (non HMO) | Admitting: Occupational Therapy

## 2022-08-06 ENCOUNTER — Ambulatory Visit: Payer: Managed Care, Other (non HMO)

## 2022-08-06 ENCOUNTER — Ambulatory Visit: Payer: Managed Care, Other (non HMO) | Admitting: Occupational Therapy

## 2022-08-06 ENCOUNTER — Ambulatory Visit: Payer: Managed Care, Other (non HMO) | Admitting: Family Medicine

## 2022-08-07 ENCOUNTER — Ambulatory Visit: Payer: Managed Care, Other (non HMO)

## 2022-08-07 ENCOUNTER — Other Ambulatory Visit (HOSPITAL_COMMUNITY): Payer: Managed Care, Other (non HMO)

## 2022-08-08 ENCOUNTER — Ambulatory Visit (HOSPITAL_BASED_OUTPATIENT_CLINIC_OR_DEPARTMENT_OTHER)
Admission: RE | Admit: 2022-08-08 | Payer: Managed Care, Other (non HMO) | Source: Ambulatory Visit | Admitting: Obstetrics and Gynecology

## 2022-08-08 ENCOUNTER — Encounter (HOSPITAL_BASED_OUTPATIENT_CLINIC_OR_DEPARTMENT_OTHER): Admission: RE | Payer: Self-pay | Source: Ambulatory Visit

## 2022-08-08 HISTORY — DX: Tremor, unspecified: R25.1

## 2022-08-08 HISTORY — DX: Cyclical vomiting syndrome unrelated to migraine: R11.15

## 2022-08-08 HISTORY — DX: Hyperlipidemia, unspecified: E78.5

## 2022-08-08 SURGERY — XI ROBOTIC ASSISTED LAPAROSCOPIC HYSTERECTOMY AND SALPINGECTOMY
Anesthesia: General | Laterality: Bilateral

## 2022-08-17 ENCOUNTER — Other Ambulatory Visit: Payer: Self-pay | Admitting: Family Medicine

## 2022-08-19 ENCOUNTER — Encounter (HOSPITAL_COMMUNITY): Payer: Self-pay | Admitting: Cardiovascular Disease

## 2022-08-21 ENCOUNTER — Ambulatory Visit: Payer: Managed Care, Other (non HMO) | Admitting: Gastroenterology

## 2022-08-30 ENCOUNTER — Other Ambulatory Visit: Payer: Self-pay | Admitting: Cardiovascular Disease

## 2022-08-30 DIAGNOSIS — R0683 Snoring: Secondary | ICD-10-CM

## 2022-08-30 DIAGNOSIS — R400 Somnolence: Secondary | ICD-10-CM

## 2022-09-03 ENCOUNTER — Telehealth: Payer: Self-pay | Admitting: *Deleted

## 2022-09-03 NOTE — Telephone Encounter (Signed)
Per [employer] no PA is required for home sleep test. Call reference # [id number].

## 2022-09-13 ENCOUNTER — Ambulatory Visit: Payer: Managed Care, Other (non HMO) | Admitting: Cardiovascular Disease

## 2022-09-13 ENCOUNTER — Telehealth: Payer: Self-pay | Admitting: Family Medicine

## 2022-09-13 NOTE — Telephone Encounter (Signed)
.  Type of form received: Disability paperwork  Additional comments:   Received by: Adonis Brook  Form should be Faxed to: [phone]  Form should be mailed to:    Is patient requesting call for pickup:   Form placed:  In provider's box   Attach charge sheet. yes  Individual made aware of 3-5 business day turn around (Y/N)?  No

## 2022-09-18 NOTE — Telephone Encounter (Signed)
LVM to schedule an ov

## 2022-09-24 NOTE — Telephone Encounter (Signed)
Patient is scheduled for an Office Visit on 09/30/22

## 2022-09-30 ENCOUNTER — Other Ambulatory Visit (HOSPITAL_COMMUNITY): Payer: Managed Care, Other (non HMO)

## 2022-09-30 ENCOUNTER — Encounter (HOSPITAL_COMMUNITY): Payer: Self-pay | Admitting: Cardiovascular Disease

## 2022-09-30 ENCOUNTER — Ambulatory Visit: Payer: Managed Care, Other (non HMO) | Admitting: Family Medicine

## 2022-10-11 ENCOUNTER — Ambulatory Visit: Payer: Managed Care, Other (non HMO) | Admitting: Nurse Practitioner

## 2022-10-11 ENCOUNTER — Telehealth: Payer: Self-pay | Admitting: Family Medicine

## 2022-10-11 NOTE — Telephone Encounter (Signed)
.  Type of form received: Long Term Disability  Additional comments:   Received by: Adonis Brook  Form should be Faxed to: [phone]  Form should be mailed to:    Is patient requesting call for pickup:   Form placed:  In provider's box  Attach charge sheet. yes  Individual made aware of 3-5 business day turn around (Y/N)?  No

## 2022-10-11 NOTE — Telephone Encounter (Signed)
Patient need to scheduled appointment to discuss. Patient no showed last appointment to discuss paperwork. Cannot be filled out without an appointment.

## 2022-10-15 ENCOUNTER — Other Ambulatory Visit: Payer: Self-pay | Admitting: Family Medicine

## 2022-10-15 ENCOUNTER — Encounter: Payer: Self-pay | Admitting: Family Medicine

## 2022-10-15 ENCOUNTER — Telehealth: Payer: Self-pay | Admitting: *Deleted

## 2022-10-15 ENCOUNTER — Telehealth: Payer: Self-pay | Admitting: Neurology

## 2022-10-15 ENCOUNTER — Ambulatory Visit (INDEPENDENT_AMBULATORY_CARE_PROVIDER_SITE_OTHER): Payer: Managed Care, Other (non HMO) | Admitting: Family Medicine

## 2022-10-15 ENCOUNTER — Other Ambulatory Visit: Payer: Managed Care, Other (non HMO)

## 2022-10-15 VITALS — BP 100/60 | HR 86 | Temp 98.7°F | Ht 67.0 in | Wt 202.0 lb

## 2022-10-15 DIAGNOSIS — D500 Iron deficiency anemia secondary to blood loss (chronic): Secondary | ICD-10-CM

## 2022-10-15 DIAGNOSIS — E538 Deficiency of other specified B group vitamins: Secondary | ICD-10-CM | POA: Diagnosis not present

## 2022-10-15 DIAGNOSIS — E559 Vitamin D deficiency, unspecified: Secondary | ICD-10-CM

## 2022-10-15 DIAGNOSIS — D509 Iron deficiency anemia, unspecified: Secondary | ICD-10-CM

## 2022-10-15 DIAGNOSIS — R112 Nausea with vomiting, unspecified: Secondary | ICD-10-CM

## 2022-10-15 DIAGNOSIS — E6609 Other obesity due to excess calories: Secondary | ICD-10-CM

## 2022-10-15 DIAGNOSIS — Z6831 Body mass index (BMI) 31.0-31.9, adult: Secondary | ICD-10-CM

## 2022-10-15 DIAGNOSIS — R600 Localized edema: Secondary | ICD-10-CM

## 2022-10-15 DIAGNOSIS — R569 Unspecified convulsions: Secondary | ICD-10-CM

## 2022-10-15 DIAGNOSIS — R7401 Elevation of levels of liver transaminase levels: Secondary | ICD-10-CM

## 2022-10-15 DIAGNOSIS — F4323 Adjustment disorder with mixed anxiety and depressed mood: Secondary | ICD-10-CM

## 2022-10-15 DIAGNOSIS — E876 Hypokalemia: Secondary | ICD-10-CM

## 2022-10-15 HISTORY — DX: Hypokalemia: E87.6

## 2022-10-15 LAB — CBC WITH DIFFERENTIAL/PLATELET
Basophils Absolute: 0.1 10*3/uL (ref 0.0–0.1)
Basophils Relative: 1.1 % (ref 0.0–3.0)
Eosinophils Absolute: 0.1 10*3/uL (ref 0.0–0.7)
Eosinophils Relative: 1.1 % (ref 0.0–5.0)
HCT: 32.9 % — ABNORMAL LOW (ref 36.0–46.0)
Hemoglobin: 11.1 g/dL — ABNORMAL LOW (ref 12.0–15.0)
Lymphocytes Relative: 29.6 % (ref 12.0–46.0)
Lymphs Abs: 2.7 10*3/uL (ref 0.7–4.0)
MCHC: 33.7 g/dL (ref 30.0–36.0)
MCV: 106.9 fl — ABNORMAL HIGH (ref 78.0–100.0)
Monocytes Absolute: 1.2 10*3/uL — ABNORMAL HIGH (ref 0.1–1.0)
Monocytes Relative: 12.7 % — ABNORMAL HIGH (ref 3.0–12.0)
Neutro Abs: 5.1 10*3/uL (ref 1.4–7.7)
Neutrophils Relative %: 55.5 % (ref 43.0–77.0)
Platelets: 556 10*3/uL — ABNORMAL HIGH (ref 150.0–400.0)
RBC: 3.08 Mil/uL — ABNORMAL LOW (ref 3.87–5.11)
RDW: 17.4 % — ABNORMAL HIGH (ref 11.5–15.5)
WBC: 9.1 10*3/uL (ref 4.0–10.5)

## 2022-10-15 LAB — COMPREHENSIVE METABOLIC PANEL
ALT: 30 U/L (ref 0–35)
AST: 64 U/L — ABNORMAL HIGH (ref 0–37)
Albumin: 3.7 g/dL (ref 3.5–5.2)
Alkaline Phosphatase: 77 U/L (ref 39–117)
BUN: 11 mg/dL (ref 6–23)
CO2: 28 mEq/L (ref 19–32)
Calcium: 9.4 mg/dL (ref 8.4–10.5)
Chloride: 98 mEq/L (ref 96–112)
Creatinine, Ser: 0.62 mg/dL (ref 0.40–1.20)
GFR: 110.75 mL/min (ref >60.00)
Glucose, Bld: 96 mg/dL (ref 70–99)
Potassium: 2.5 mEq/L — CL (ref 3.5–5.1)
Sodium: 139 mEq/L (ref 135–145)
Total Bilirubin: 3.2 mg/dL — ABNORMAL HIGH (ref 0.2–1.2)
Total Protein: 6.4 g/dL (ref 6.0–8.3)

## 2022-10-15 LAB — IBC + FERRITIN
Ferritin: 211.9 ng/mL (ref 10.0–291.0)
Iron: 144 ug/dL (ref 42–145)
Saturation Ratios: 59.8 % — ABNORMAL HIGH (ref 20.0–50.0)
TIBC: 240.8 ug/dL — ABNORMAL LOW (ref 250.0–450.0)
Transferrin: 172 mg/dL — ABNORMAL LOW (ref 212.0–360.0)

## 2022-10-15 LAB — VITAMIN B12: Vitamin B-12: >1500 pg/mL — ABNORMAL HIGH (ref 211–911)

## 2022-10-15 LAB — LIPASE: Lipase: 38 U/L (ref 11.0–59.0)

## 2022-10-15 LAB — AMYLASE: Amylase: 16 U/L — ABNORMAL LOW (ref 27–131)

## 2022-10-15 LAB — TSH: TSH: 4.27 u[IU]/mL (ref 0.35–5.50)

## 2022-10-15 LAB — VITAMIN D 25 HYDROXY (VIT D DEFICIENCY, FRACTURES): VITD: 51.84 ng/mL (ref 30.00–100.00)

## 2022-10-15 LAB — MAGNESIUM: Magnesium: 1.7 mg/dL (ref 1.5–2.5)

## 2022-10-15 MED ORDER — CYANOCOBALAMIN 1000 MCG/ML IJ SOLN
1000.0000 ug | Freq: Once | INTRAMUSCULAR | Status: AC
  Administered 2022-10-15: 1000 ug via INTRAMUSCULAR

## 2022-10-15 MED ORDER — AMILORIDE HCL 5 MG PO TABS: 5.0000 mg | ORAL_TABLET | Freq: Every day | ORAL | 0 refills | Status: DC | PRN

## 2022-10-15 MED ORDER — VITAMIN D (ERGOCALCIFEROL) 1.25 MG (50000 UNIT) PO CAPS: 50000.0000 [IU] | ORAL_CAPSULE | ORAL | 0 refills | Status: DC

## 2022-10-15 MED ORDER — KLOR-CON M20 20 MEQ PO TBCR: 40.0000 meq | EXTENDED_RELEASE_TABLET | Freq: Three times a day (TID) | ORAL | 1 refills | Status: DC

## 2022-10-15 MED ORDER — HYDROCHLOROTHIAZIDE 25 MG PO TABS: 25.0000 mg | ORAL_TABLET | Freq: Every day | ORAL | 1 refills | Status: DC | PRN

## 2022-10-15 MED ORDER — OMEPRAZOLE 40 MG PO CPDR: 40.0000 mg | DELAYED_RELEASE_CAPSULE | Freq: Every day | ORAL | 1 refills | Status: DC

## 2022-10-15 MED ORDER — PROMETHAZINE HCL 25 MG PO TABS: 25.0000 mg | ORAL_TABLET | Freq: Three times a day (TID) | ORAL | 0 refills | Status: DC | PRN

## 2022-10-15 NOTE — Progress Notes (Signed)
1.  Discussed potassium with patient see previous note 2.  Left brief message on patient answering machine about liver tests and hemoglobin being slightly decreased. Please call patient and let her know she definitely needs to drink more fluids, vitamin D is in good range so I think she can take the vitamin D every other week.

## 2022-10-15 NOTE — Addendum Note (Signed)
Addended by: Wellington Hampshire on: 10/15/2022 09:01 PM   Modules accepted: Orders

## 2022-10-15 NOTE — Telephone Encounter (Signed)
Pt called stated she is having seizures and need to be seen soon. Pt is asking if she can start back taking her medication to help until her appointment. Pt is requesting a call back from nurse.

## 2022-10-15 NOTE — Telephone Encounter (Signed)
CRITICAL VALUE STICKER  CRITICAL VALUE: Potassium 2.5  RECEIVER (on-site recipient of call): Anselmo Pickler, LPN  DATE & TIME NOTIFIED: 10/15/2022 at 3:08  MESSENGER (representative from lab): [facility] Caprice lab  MD NOTIFIED: Dr. Esther Hardy  TIME OF NOTIFICATION: 3:10  RESPONSE: High priority message sent to provider.

## 2022-10-15 NOTE — Telephone Encounter (Signed)
Needs to get from neuro

## 2022-10-15 NOTE — Telephone Encounter (Signed)
Ok to restart Keppra. Please inquire if she needs refills.

## 2022-10-15 NOTE — Patient Instructions (Addendum)
It was very nice to see you today!  ER if vomiting a lot  Hctz-try 1/2 tab or only take if needed!     PLEASE NOTE:  If you had any lab tests please let us know if you have not heard back within a few days. You may see your results on MyChart before we have a chance to review them but we will give you a call once they are reviewed by [place]. If we ordered any referrals today, please let us know if you have not heard from their office within the next week.   Please try these tips to maintain a healthy lifestyle:  Eat most of your calories during the day when you are active. Eliminate processed foods including packaged sweets (pies, cakes, cookies), reduce intake of potatoes, white bread, white pasta, and white rice. Look for whole grain options, oat flour or almond flour.  Each meal should contain half fruits/vegetables, one quarter protein, and one quarter carbs (no bigger than a computer mouse).  Cut down on sweet beverages. This includes juice, soda, and sweet tea. Also watch fruit intake, though this is a healthier sweet option, it still contains natural sugar! Limit to 3 servings daily.  Drink at least 1 glass of water with each meal and aim for at least 8 glasses per day  Exercise at least 150 minutes every week.  hc

## 2022-10-15 NOTE — Progress Notes (Addendum)
Subjective:     Patient ID: Janice Arnold, female    DOB: 08/19/1980, 42 y.o.   MRN: [id number]  Chief Complaint  Patient presents with   Form Completion    Discuss disability paperwork    Follow-up    Follow-up due to haven't been seen in a long time Fasting Wants B 12 shot    HPI B12 deficiency-was on injections-hasn't come in in sev months Anemia-thought to be partly d/t vag bleeding.  Was sch for hyst-canceled by surgeon-per pt d/t other health problems.  Needs clearance.  Needs to see card again,neuro  Has been sick since December-mom hosp,then pt started w/cyclical vomiting again- weakness(couldn't even walk)-vomiting/diarrhea-mom had flu and pneumonia so pt thought that was issue at first, but then continued illness.  Was Vomiting 3-4x/day and stopped 2-3 wks ago.  This is first week not having problems.  Occurs for 2-3 wks, stops for about 2 wk and restarts.  Not eating much.  Has not gone to the emergency room.  Frequently not able to take any of her medications.  She is not taking the potassium for months.  She is very aware that she should have let us know and gone to the emergency room. SDH-CT tomorrow.  Neuro sch in May-has missed some appointments "due to illness" l.   Getting more Sz again-was trying to walk in house and everything "locks up and shaking".  Has had 4 of these.  Has called neuro and told earliest appointment is May    Not taking meds regularly d/t vomiting Heart palp.  Not taking K d/t vomiting-possibly even long QT syndrome-did see cardiology.  Hard to evaluate if it is due to electrolyte imbalances or true. Patient needs her disability forms filled out.-We had a firm discussion that she has missed several appointments, has not communicated with [place].  We still have not figured out the etiology for her cyclical vomiting.  She has not been drinking any alcohol, no drugs.  Health Maintenance Due  Topic Date Due   MAMMOGRAM  04/29/2020    Past Medical  History:  Diagnosis Date   Alcohol abuse    hx of heavy alcohol abuse, quit 01/2022   Anemia 2023   s/p iron infusions, most recent as of 07/30/22 was on 05/16/22   Anxiety    Follows with PCP. Dr. Wallene Dales, [place] [id number]. Currently taking Lorazepam and hydroxyzine   Cyclic vomiting syndrome    hospital admission on 05/28/22 for vomiting, follows with gastroenterologist Dr. 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 Dustin Flock, [place]  02/21/22 in Epic ( as of 07/31/22).   Hyperlipidemia    07/16/2022 lipid panel in Epic   Menorrhagia    Seizure ([place]) 06/06/2022   no prior hx of seizures, quit alcohol in 01/2022, follows with neurologist, Dr. 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 See 06/06/22 Head CT and MRI in Epic.   Tremors of nervous system    whole body tremors, follows with Dr. Hinton Rao @ [facility], [place] 07/11/22 in Epic.   Vitamin deficiency 2023   B12 deficiency, receiving monthly B12 injections  Past Surgical History:  Procedure Laterality Date   LAPAROSCOPIC APPENDECTOMY N/A 03/02/2022   Procedure: APPENDECTOMY LAPAROSCOPIC;  Surgeon: Donnie Mesa, MD;  Location: [facility];  Service: General;  Laterality: N/A;    Outpatient Medications Prior to Visit  Medication Sig Dispense Refill   hydrOXYzine (ATARAX) 10 MG tablet Take 1 tablet (10 mg total) by mouth every 6 (six) hours as needed. 120 tablet 3   LOW-OGESTREL 0.3-30 MG-MCG tablet Take 1 tablet by mouth daily.     melatonin 1 MG TABS tablet Take 1 mg by mouth at bedtime as needed (sleep).     metoprolol succinate (TOPROL XL) 25 MG 24 hr tablet Take 1 tablet (25 mg total) by mouth daily.  90 tablet 3   norethindrone (AYGESTIN) 5 MG tablet Take 10 mg by mouth daily.     ondansetron (ZOFRAN-ODT) 4 MG disintegrating tablet Take 1 tablet (4 mg total) by mouth every 8 (eight) hours as needed for nausea or vomiting. 60 tablet 3   sertraline (ZOLOFT) 100 MG tablet Take 1 tablet (100 mg total) by mouth daily. 90 tablet 1   hydrochlorothiazide (HYDRODIURIL) 25 MG tablet Take 25 mg by mouth daily.     LORazepam (ATIVAN) 2 MG tablet TAKE 1 TABLET(2 MG) BY MOUTH EVERY 6 HOURS AS NEEDED FOR ANXIETY 120 tablet 0   omeprazole (PRILOSEC) 40 MG capsule Take 1 capsule (40 mg total) by mouth daily. 90 capsule 1   promethazine (PHENERGAN) 25 MG tablet Take 1 tablet (25 mg total) by mouth every 8 (eight) hours as needed for nausea or vomiting. 20 tablet 0   Vitamin D, Ergocalciferol, (DRISDOL) 1.25 MG (50000 UNIT) CAPS capsule Take 1 capsule (50,000 Units total) by mouth every 7 (seven) days. TAKE 1 CAPSULE (50,000 UNITS) BY MOUTH EVERY 7 DAYS Strength: 1.25 mg 12 capsule 0   KLOR-CON M20 20 MEQ tablet Take 40 mEq by mouth 3 (three) times daily. (Patient not taking: Reported on 10/15/2022)     No facility-administered medications prior to visit.    No Known Allergies ROS neg/noncontributory except as noted HPI/below Gets edema frequently," needs" hydrochlorothiazide      Objective:     BP 100/60   Pulse 86   Temp 98.7 F (37.1 C) (Temporal)   Ht '5\' 7"'$  (1.702 m)   Wt 202 lb (91.6 kg)   LMP 10/15/2022 (Exact Date)   SpO2 99%   BMI 31.64 kg/m  Wt Readings from Last 3 Encounters:  10/15/22 202 lb (91.6 kg)  07/15/22 215 lb (97.5 kg)  07/11/22 204 lb 8 oz (92.8 kg)    Physical Exam   Gen: WDWN NAD.  Pale, chronically ill, tired HEENT: NCAT, conjunctiva not injected, sclera nonicteric NECK:  supple, no thyromegaly, no nodes, no carotid bruits CARDIAC: RRR, S1S2+, no murmur. DP 2+B LUNGS: CTAB. No wheezes ABDOMEN:  BS+, soft, mild pain diffuse esp mid epi, No HSM, no masses EXT:  no  edema MSK: no gross abnormalities.  NEURO: A&O x3.  CN II-XII intact.  PSYCH: normal mood. Good eye contact     Assessment & Plan:   Problem List Items Addressed This Visit       Digestive   Intractable nausea and vomiting - Primary   Relevant Orders   Comprehensive metabolic panel (Completed)   CBC with Differential/Platelet (Completed)   Magnesium (Completed)   Amylase (Completed)   Lipase (Completed)     Other   Vitamin D deficiency   Relevant Orders   VITAMIN  D 25 Hydroxy (Vit-D Deficiency, Fractures) (Completed)   Iron deficiency anemia due to chronic blood loss   Relevant Orders   CBC with Differential/Platelet (Completed)   IBC + Ferritin (Completed)   Local edema   Adjustment disorder with mixed anxiety and depressed mood   Hypomagnesemia   Relevant Orders   Magnesium (Completed)   Seizure ([place])   Other Visit Diagnoses     B12 deficiency       Relevant Medications   cyanocobalamin (VITAMIN B12) injection 1,000 mcg (Completed)   Other Relevant Orders   Vitamin B12 (Completed)   CBC with Differential/Platelet (Completed)   Class 1 obesity due to excess calories with serious comorbidity and body mass index (BMI) of 31.0 to 31.9 in adult       Relevant Orders   Hemoglobin A1c   TSH (Completed)   Iron deficiency anemia, unspecified iron deficiency anemia type       Relevant Medications   cyanocobalamin (VITAMIN B12) injection 1,000 mcg (Completed)   omeprazole (PRILOSEC) 40 MG capsule     1.  Intractable nausea vomiting-cyclic-etiology has been unclear.  Advised that when it is lasting as long as it has, she needs to go to the emergency room.  Gets dehydrated and electrolyte disturbances.  Advised that this can be very life-threatening.  Patient voiced understanding.  Renewed promethazine 25 mg.  She knows to be very cautious about using it as possible long QT syndrome.  She has follow-up with GI.  We really need to figure out what the etiology is of this.   Check CBC, CMP, magnesium, amylase, lipase 2.  Vitamin B12 deficiency-probably more due to malabsorption.  She has not been seen in the office for several months.  Has not had a B12 injection.  This was given today.  Advise she needs to have closer follow-up. 3.  Vitamin D deficiency-will check vitamin D.  Renewed 50,000 IUs weekly 4.  Intermittent edema-exam is normal today.  Patient states she needs hydrochlorothiazide intermittently.  Advised that this contributes to her hypokalemia.  Discussed only to use if absolutely necessary.  Try half a tablet.  Check labs 5 Hypomagnesimea-chronic.  Suspect from cyclic vomiting and poor absorption 6.  Iron deficiency anemia-was thought to be due to heavy menses.  Patient is currently on OCP to suppress cycles.  Hysterectomy was placed on hold per patient due to seizure, heart.  She needs to follow-up with the specialties.  Check B12, CBC, iron studies 7.  Obesity-patient is not eating much, however continues to gain weight.  Check TSH, A1c 8.  Adjustment disorder-chronic.  Continue Zoloft 100 mg daily. 9.  Seizure disorder-not currently on any medications as this was a questionable diagnosis.  She did have ICH-unknown etiology.  Does have a CAT scan scheduled for tomorrow.  Advised to call and follow-up with neurology. 10.  Spent 45 minutes with patient discussing the risks to her health/life.  Her need for regular follow-up as she is lost a few months of time.  Really need to figure out what is going on as we cannot keep her out of work forever.  However, given all these problems and probable electrolyte abnormalities we will find on labs, I cannot send her back to work at this stage.  Will need to fill out her disability forms.  Needs to follow-up in 1 month  Meds ordered this encounter  Medications   cyanocobalamin (VITAMIN B12) injection 1,000 mcg   hydrochlorothiazide (HYDRODIURIL) 25 MG tablet  Sig: Take 1 tablet (25 mg total) by mouth daily as  needed.    Dispense:  30 tablet    Refill:  1   omeprazole (PRILOSEC) 40 MG capsule    Sig: Take 1 capsule (40 mg total) by mouth daily.    Dispense:  90 capsule    Refill:  1   DISCONTD: Vitamin D, Ergocalciferol, (DRISDOL) 1.25 MG (50000 UNIT) CAPS capsule    Sig: Take 1 capsule (50,000 Units total) by mouth every 7 (seven) days. TAKE 1 CAPSULE (50,000 UNITS) BY MOUTH EVERY 7 DAYS Strength: 1.25 mg    Dispense:  12 capsule    Refill:  0   promethazine (PHENERGAN) 25 MG tablet    Sig: Take 1 tablet (25 mg total) by mouth every 8 (eight) hours as needed for nausea or vomiting.    Dispense:  20 tablet    Refill:  0   Vitamin D, Ergocalciferol, (DRISDOL) 1.25 MG (50000 UNIT) CAPS capsule    Sig: Take 1 capsule (50,000 Units total) by mouth every 7 (seven) days. TAKE 1 CAPSULE (50,000 UNITS) BY MOUTH EVERY 7 DAYS Strength: 1.25 mg    Dispense:  12 capsule    Refill:  0    Wellington Hampshire, MD

## 2022-10-15 NOTE — Telephone Encounter (Signed)
Called and spoke to patient who relays that she has had 4-5 seizures since January where she had full body shakes, did not know how long they lasted, but says that each seizure was following by 2 weeks of throwing up. Patient denied any changes in typical meds but was taken off Keppra and wants to restart prior to her appointment in May. Patient also denied any alcohol or drug use recently or around the time of the seizures. Will forward to provider for recommendations.

## 2022-10-16 ENCOUNTER — Other Ambulatory Visit: Payer: Self-pay | Admitting: Family Medicine

## 2022-10-16 ENCOUNTER — Ambulatory Visit
Admission: RE | Admit: 2022-10-16 | Discharge: 2022-10-16 | Disposition: A | Discharge status: Home / Self Care | Payer: Managed Care, Other (non HMO) | Source: Ambulatory Visit | Attending: Neurology | Admitting: Neurology

## 2022-10-16 DIAGNOSIS — S065XAA Traumatic subdural hemorrhage with loss of consciousness status unknown, initial encounter: Secondary | ICD-10-CM | POA: Diagnosis not present

## 2022-10-16 LAB — HEMOGLOBIN A1C: Hgb A1c MFr Bld: <4.2 % of total Hgb (ref <5.7)

## 2022-10-16 NOTE — Addendum Note (Signed)
Addended by: Wellington Hampshire on: 10/16/2022 09:26 PM   Modules accepted: Orders

## 2022-10-17 ENCOUNTER — Other Ambulatory Visit (INDEPENDENT_AMBULATORY_CARE_PROVIDER_SITE_OTHER): Payer: Managed Care, Other (non HMO)

## 2022-10-17 DIAGNOSIS — E876 Hypokalemia: Secondary | ICD-10-CM | POA: Diagnosis not present

## 2022-10-17 DIAGNOSIS — R7401 Elevation of levels of liver transaminase levels: Secondary | ICD-10-CM

## 2022-10-17 DIAGNOSIS — Z0279 Encounter for issue of other medical certificate: Secondary | ICD-10-CM

## 2022-10-17 LAB — COMPREHENSIVE METABOLIC PANEL
ALT: 20 U/L (ref 0–35)
AST: 44 U/L — ABNORMAL HIGH (ref 0–37)
Albumin: 3.2 g/dL — ABNORMAL LOW (ref 3.5–5.2)
Alkaline Phosphatase: 57 U/L (ref 39–117)
BUN: 8 mg/dL (ref 6–23)
CO2: 23 mEq/L (ref 19–32)
Calcium: 9 mg/dL (ref 8.4–10.5)
Chloride: 108 mEq/L (ref 96–112)
Creatinine, Ser: 0.66 mg/dL (ref 0.40–1.20)
GFR: 109.09 mL/min (ref >60.00)
Glucose, Bld: 84 mg/dL (ref 70–99)
Potassium: 4.3 mEq/L (ref 3.5–5.1)
Sodium: 140 mEq/L (ref 135–145)
Total Bilirubin: 2 mg/dL — ABNORMAL HIGH (ref 0.2–1.2)
Total Protein: 5.5 g/dL — ABNORMAL LOW (ref 6.0–8.3)

## 2022-10-17 LAB — CK: Total CK: 56 U/L (ref 7–177)

## 2022-10-17 LAB — CORTISOL: Cortisol, Plasma: 5.3 ug/dL

## 2022-10-17 LAB — GAMMA GT: GGT: 283 U/L — ABNORMAL HIGH (ref 7–51)

## 2022-10-17 NOTE — Progress Notes (Signed)
D/w pt-CT ok  K back to normal so decrease to 2 tabs bid and reck 1 wk.

## 2022-10-17 NOTE — Addendum Note (Signed)
Addended by: Wellington Hampshire on: 10/17/2022 04:13 PM   Modules accepted: Orders

## 2022-10-19 LAB — ANA: Anti Nuclear Antibody (ANA): POSITIVE — AB

## 2022-10-19 LAB — ANTI-NUCLEAR AB-TITER (ANA TITER): ANA Titer 1: 1:80 {titer} — ABNORMAL HIGH

## 2022-10-21 ENCOUNTER — Telehealth: Payer: Self-pay | Admitting: Cardiovascular Disease

## 2022-10-21 ENCOUNTER — Other Ambulatory Visit: Payer: Self-pay | Admitting: Family Medicine

## 2022-10-21 ENCOUNTER — Other Ambulatory Visit: Payer: Self-pay | Admitting: Neurology

## 2022-10-21 ENCOUNTER — Other Ambulatory Visit: Payer: Self-pay | Admitting: *Deleted

## 2022-10-21 DIAGNOSIS — R768 Other specified abnormal immunological findings in serum: Secondary | ICD-10-CM

## 2022-10-21 MED ORDER — EZETIMIBE 10 MG PO TABS: 10.0000 mg | ORAL_TABLET | Freq: Every day | ORAL | 3 refills | Status: DC

## 2022-10-21 MED ORDER — LEVETIRACETAM 250 MG PO TABS: 250.0000 mg | ORAL_TABLET | Freq: Two times a day (BID) | ORAL | 6 refills | Status: DC

## 2022-10-21 NOTE — Telephone Encounter (Signed)
  Pt c/o medication issue:  1. Name of Medication: Zetia 10 mg   2. How are you currently taking this medication (dosage and times per day)?   3. Are you having a reaction (difficulty breathing--STAT)?   4. What is your medication issue? Pt saw on her mychart that Dr. Claiborne Billings wants her to start taking this medication. She wanted o know if this is sent to her pharmacy.  Medication is not on hr med list

## 2022-10-21 NOTE — Telephone Encounter (Signed)
Returned call to patient to discuss.  Per lab results:  Janice Sine, MD 07/27/2022 11:49 AM EST     Elevated LFTs, slightly improved; MCV elevated at 102.  Check B12 folate level.  Significant lipid elevation with total cholesterol 238 LDL 187.  With elevated LFTs cannot initiate statin now.  Start with Zetia 10 mg.    Rx not sent, awaiting pharmacy confirmation.   Pharmacy confirmed and rx sent.  Patient aware.

## 2022-10-21 NOTE — Progress Notes (Signed)
Keppra 250 mg BID restarted.

## 2022-10-21 NOTE — Telephone Encounter (Signed)
We can restart at 250 mg BID. I will sent a Rx.

## 2022-10-24 ENCOUNTER — Other Ambulatory Visit (INDEPENDENT_AMBULATORY_CARE_PROVIDER_SITE_OTHER): Payer: Managed Care, Other (non HMO)

## 2022-10-24 DIAGNOSIS — E876 Hypokalemia: Secondary | ICD-10-CM

## 2022-10-24 LAB — BASIC METABOLIC PANEL
BUN: 9 mg/dL (ref 6–23)
CO2: 23 mEq/L (ref 19–32)
Calcium: 8.5 mg/dL (ref 8.4–10.5)
Chloride: 104 mEq/L (ref 96–112)
Creatinine, Ser: 0.64 mg/dL (ref 0.40–1.20)
GFR: 109.89 mL/min (ref >60.00)
Glucose, Bld: 77 mg/dL (ref 70–99)
Potassium: 4.7 mEq/L (ref 3.5–5.1)
Sodium: 137 mEq/L (ref 135–145)

## 2022-10-24 NOTE — Progress Notes (Signed)
Potassium is much better.  I believe she is taking 4/day.  If so, she can decrease to 3/day

## 2022-10-25 ENCOUNTER — Other Ambulatory Visit: Payer: Self-pay | Admitting: Neurology

## 2022-10-29 ENCOUNTER — Inpatient Hospital Stay: Payer: Managed Care, Other (non HMO) | Admitting: Family

## 2022-10-29 ENCOUNTER — Inpatient Hospital Stay: Payer: Managed Care, Other (non HMO) | Attending: Hematology & Oncology

## 2022-11-05 DIAGNOSIS — S22000A Wedge compression fracture of unspecified thoracic vertebra, initial encounter for closed fracture: Secondary | ICD-10-CM

## 2022-11-05 HISTORY — DX: Wedge compression fracture of unspecified thoracic vertebra, initial encounter for closed fracture: S22.000A

## 2022-11-07 ENCOUNTER — Other Ambulatory Visit: Payer: Self-pay | Admitting: Family Medicine

## 2022-11-09 ENCOUNTER — Encounter: Payer: Self-pay | Admitting: Family Medicine

## 2022-11-12 ENCOUNTER — Ambulatory Visit (INDEPENDENT_AMBULATORY_CARE_PROVIDER_SITE_OTHER)
Admission: RE | Admit: 2022-11-12 | Discharge: 2022-11-12 | Disposition: A | Discharge status: Home / Self Care | Payer: Managed Care, Other (non HMO) | Source: Ambulatory Visit | Attending: Family Medicine | Admitting: Family Medicine

## 2022-11-12 ENCOUNTER — Ambulatory Visit (INDEPENDENT_AMBULATORY_CARE_PROVIDER_SITE_OTHER): Payer: Managed Care, Other (non HMO) | Admitting: Family Medicine

## 2022-11-12 ENCOUNTER — Encounter: Payer: Self-pay | Admitting: Neurology

## 2022-11-12 VITALS — BP 120/82 | HR 92 | Temp 98.2°F | Ht 67.0 in | Wt 216.2 lb

## 2022-11-12 DIAGNOSIS — R569 Unspecified convulsions: Secondary | ICD-10-CM

## 2022-11-12 DIAGNOSIS — M549 Dorsalgia, unspecified: Secondary | ICD-10-CM | POA: Diagnosis not present

## 2022-11-12 DIAGNOSIS — D500 Iron deficiency anemia secondary to blood loss (chronic): Secondary | ICD-10-CM

## 2022-11-12 DIAGNOSIS — R112 Nausea with vomiting, unspecified: Secondary | ICD-10-CM | POA: Diagnosis not present

## 2022-11-12 DIAGNOSIS — E538 Deficiency of other specified B group vitamins: Secondary | ICD-10-CM | POA: Diagnosis not present

## 2022-11-12 LAB — COMPREHENSIVE METABOLIC PANEL
ALT: 18 U/L (ref 0–35)
AST: 34 U/L (ref 0–37)
Albumin: 4.2 g/dL (ref 3.5–5.2)
Alkaline Phosphatase: 47 U/L (ref 39–117)
BUN: 13 mg/dL (ref 6–23)
CO2: 24 mEq/L (ref 19–32)
Calcium: 9.5 mg/dL (ref 8.4–10.5)
Chloride: 103 mEq/L (ref 96–112)
Creatinine, Ser: 0.65 mg/dL (ref 0.40–1.20)
GFR: 109.44 mL/min (ref >60.00)
Glucose, Bld: 94 mg/dL (ref 70–99)
Potassium: 3.6 mEq/L (ref 3.5–5.1)
Sodium: 136 mEq/L (ref 135–145)
Total Bilirubin: 1 mg/dL (ref 0.2–1.2)
Total Protein: 6.8 g/dL (ref 6.0–8.3)

## 2022-11-12 LAB — CBC WITH DIFFERENTIAL/PLATELET
Basophils Absolute: 0 10*3/uL (ref 0.0–0.1)
Basophils Relative: 0.6 % (ref 0.0–3.0)
Eosinophils Absolute: 0.1 10*3/uL (ref 0.0–0.7)
Eosinophils Relative: 2.1 % (ref 0.0–5.0)
HCT: 33.1 % — ABNORMAL LOW (ref 36.0–46.0)
Hemoglobin: 11.2 g/dL — ABNORMAL LOW (ref 12.0–15.0)
Lymphocytes Relative: 29.1 % (ref 12.0–46.0)
Lymphs Abs: 1.8 10*3/uL (ref 0.7–4.0)
MCHC: 33.9 g/dL (ref 30.0–36.0)
MCV: 108.4 fl — ABNORMAL HIGH (ref 78.0–100.0)
Monocytes Absolute: 0.6 10*3/uL (ref 0.1–1.0)
Monocytes Relative: 10.4 % (ref 3.0–12.0)
Neutro Abs: 3.6 10*3/uL (ref 1.4–7.7)
Neutrophils Relative %: 57.8 % (ref 43.0–77.0)
Platelets: 235 10*3/uL (ref 150.0–400.0)
RBC: 3.05 Mil/uL — ABNORMAL LOW (ref 3.87–5.11)
RDW: 17.9 % — ABNORMAL HIGH (ref 11.5–15.5)
WBC: 6.2 10*3/uL (ref 4.0–10.5)

## 2022-11-12 MED ORDER — CYANOCOBALAMIN 1000 MCG/ML IJ SOLN
1000.0000 ug | Freq: Once | INTRAMUSCULAR | Status: AC
  Administered 2022-11-12: 1000 ug via INTRAMUSCULAR

## 2022-11-12 MED ORDER — OXYCODONE HCL 5 MG PO CAPS: 5.0000 mg | ORAL_CAPSULE | ORAL | 0 refills | Status: DC | PRN
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
## 2022-11-12 NOTE — Patient Instructions (Signed)
get X-ray/labs at [facility].  [address]  hours 8=M-F 8:30-5.  closed 12:30-1 lunch  

## 2022-11-13 ENCOUNTER — Other Ambulatory Visit: Payer: Self-pay | Admitting: Family Medicine

## 2022-11-13 ENCOUNTER — Encounter (HOSPITAL_COMMUNITY): Payer: Self-pay

## 2022-11-13 ENCOUNTER — Ambulatory Visit (HOSPITAL_COMMUNITY): Payer: Managed Care, Other (non HMO) | Attending: Cardiovascular Disease

## 2022-11-13 NOTE — Progress Notes (Signed)
Labs are stable.  Continue potassium as it is on the low end.  Make sure you are taking iron.

## 2022-11-14 ENCOUNTER — Other Ambulatory Visit: Payer: Self-pay | Admitting: Family Medicine

## 2022-11-14 ENCOUNTER — Ambulatory Visit: Payer: Managed Care, Other (non HMO) | Attending: Cardiovascular Disease

## 2022-11-14 DIAGNOSIS — S22000A Wedge compression fracture of unspecified thoracic vertebra, initial encounter for closed fracture: Secondary | ICD-10-CM

## 2022-11-14 DIAGNOSIS — R9431 Abnormal electrocardiogram [ECG] [EKG]: Secondary | ICD-10-CM

## 2022-11-14 DIAGNOSIS — R400 Somnolence: Secondary | ICD-10-CM | POA: Diagnosis present

## 2022-11-14 DIAGNOSIS — R0683 Snoring: Secondary | ICD-10-CM | POA: Diagnosis present

## 2022-11-14 DIAGNOSIS — E785 Hyperlipidemia, unspecified: Secondary | ICD-10-CM | POA: Diagnosis present

## 2022-11-14 DIAGNOSIS — I517 Cardiomegaly: Secondary | ICD-10-CM

## 2022-11-14 DIAGNOSIS — R569 Unspecified convulsions: Secondary | ICD-10-CM | POA: Diagnosis present

## 2022-11-14 DIAGNOSIS — R002 Palpitations: Secondary | ICD-10-CM | POA: Diagnosis present

## 2022-11-14 DIAGNOSIS — R7989 Other specified abnormal findings of blood chemistry: Secondary | ICD-10-CM | POA: Diagnosis present

## 2022-11-14 LAB — ECHOCARDIOGRAM COMPLETE
Area-P 1/2: 4.05 cm2
S' Lateral: 2.6 cm

## 2022-11-14 NOTE — Progress Notes (Signed)
Spoke to patient.  Ordered MRI

## 2022-11-14 NOTE — Progress Notes (Signed)
Xray + compression deformity-ordering MRI

## 2022-11-15 ENCOUNTER — Encounter: Payer: Self-pay | Admitting: Nurse Practitioner

## 2022-11-15 ENCOUNTER — Ambulatory Visit (INDEPENDENT_AMBULATORY_CARE_PROVIDER_SITE_OTHER): Payer: Managed Care, Other (non HMO) | Admitting: Nurse Practitioner

## 2022-11-15 ENCOUNTER — Ambulatory Visit (INDEPENDENT_AMBULATORY_CARE_PROVIDER_SITE_OTHER): Payer: Managed Care, Other (non HMO)

## 2022-11-15 ENCOUNTER — Encounter: Payer: Self-pay | Admitting: Family

## 2022-11-15 VITALS — BP 108/68 | HR 92 | Ht 67.0 in | Wt 224.2 lb

## 2022-11-15 DIAGNOSIS — R9431 Abnormal electrocardiogram [ECG] [EKG]: Secondary | ICD-10-CM | POA: Insufficient documentation

## 2022-11-15 DIAGNOSIS — R002 Palpitations: Secondary | ICD-10-CM | POA: Diagnosis not present

## 2022-11-15 DIAGNOSIS — R400 Somnolence: Secondary | ICD-10-CM

## 2022-11-15 DIAGNOSIS — R7989 Other specified abnormal findings of blood chemistry: Secondary | ICD-10-CM

## 2022-11-15 DIAGNOSIS — R569 Unspecified convulsions: Secondary | ICD-10-CM

## 2022-11-15 DIAGNOSIS — E785 Hyperlipidemia, unspecified: Secondary | ICD-10-CM | POA: Insufficient documentation

## 2022-11-15 DIAGNOSIS — R0683 Snoring: Secondary | ICD-10-CM

## 2022-11-15 DIAGNOSIS — F1010 Alcohol abuse, uncomplicated: Secondary | ICD-10-CM

## 2022-11-15 NOTE — Progress Notes (Unsigned)
Enrolled patient for a 14 day Zio XT monitor to be mailed to patients home  Dr Kelly to read 

## 2022-11-15 NOTE — Patient Instructions (Signed)
Medication Instructions:  Your physician recommends that you continue on your current medications as directed. Please refer to the Current Medication list given to you today.  *If you need a refill on your cardiac medications before your next appointment, please call your pharmacy*   Lab Work: Your physician recommends that you return for lab work at your convenience. Fasting lipid panel & LFTs   If you have labs (blood work) drawn today and your tests are completely normal, you will receive your results only by: MyChart Message (if you have MyChart) OR A paper copy in the mail If you have any lab test that is abnormal or we need to change your treatment, we will call you to review the results.   Testing/Procedures: ZIO AT Long term monitor-Live Telemetry  Your physician has requested you wear a ZIO patch monitor for 14 days.  This is a single patch monitor. [employer] supplies one patch monitor per enrollment. Additional  stickers are not available.  Please do not apply patch if you will be having a Nuclear Stress Test, Echocardiogram, Cardiac CT, MRI,  or Chest Xray during the period you would be wearing the monitor. The patch cannot be worn during  these tests. You cannot remove and re-apply the ZIO AT patch monitor.  Your ZIO patch monitor will be mailed 3 day USPS to your address on file. It may take 3-5 days to  receive your monitor after you have been enrolled.  Once you have received your monitor, please review the enclosed instructions. Your monitor has  already been registered assigning a specific monitor serial # to you.   Billing and Patient Assistance Program information  [employer] has been supplied with any insurance information on record for billing. [employer] offers a sliding scale Patient Assistance Program for patients without insurance, or whose  insurance does not completely cover the cost of the ZIO patch monitor. You must apply for the  Patient Assistance Program to  qualify for the discounted rate. To apply, call [employer] at [phone],  select option 4, select option 2 , ask to apply for the Patient Assistance Program, (you can request an  interpreter if needed). [employer] will ask your household income and how many people are in your  household. [employer] will quote your out-of-pocket cost based on this information. They will also be able  to set up a 12 month interest free payment plan if needed.  Applying the monitor   Shave hair from upper left chest.  Hold the abrader disc by orange tab. Rub the abrader in 40 strokes over left upper chest as indicated in  your monitor instructions.  Clean area with 4 enclosed alcohol pads. Use all pads to ensure the area is cleaned thoroughly. Let  dry.  Apply patch as indicated in monitor instructions. Patch will be placed under collarbone on left side of  chest with arrow pointing upward.  Rub patch adhesive wings for 2 minutes. Remove the white label marked "1". Remove the white label  marked "2". Rub patch adhesive wings for 2 additional minutes.  While looking in a mirror, press and release button in center of patch. A small green light will flash 3-4  times. This will be your only indicator that the monitor has been turned on.  Do not shower for the first 24 hours. You may shower after the first 24 hours.  Press the button if you feel a symptom. You will hear a small click. Record Date, Time and Symptom in  the Patient Log.   Starting the Gateway  In your kit there is a Audiological scientist box the size of a cellphone. This is Buyer, retail. It transmits all your  recorded data to [facility]. This box must always stay within 10 feet of you. Open the box and push the *  button. There will be a light that blinks orange and then green a few times. When the light stops  blinking, the Gateway is connected to the ZIO patch. Call [employer] at [phone] to confirm your monitor is transmitting.  Returning your  monitor  Remove your patch and place it inside the Gateway. In the lower half of the Gateway there is a white  bag with prepaid postage on it. Place Gateway in bag and seal. Mail package back to [facility] as soon as  possible. Your physician should have your final report approximately 7 days after you have mailed back  your monitor. Call [facility] Customer Care at [phone] if you have questions regarding your ZIO AT  patch monitor. Call them immediately if you see an orange light blinking on your monitor.  If your monitor falls off in less than 4 days, contact our Monitor department at [phone]. If your  monitor becomes loose or falls off after 4 days call [employer] at [phone] for suggestions on  securing your monitor    Follow-Up: At [facility], you and your health needs are our priority.  As part of our continuing mission to provide you with exceptional heart care, we have created designated Provider Care Teams.  These Care Teams include your primary Cardiologist (physician) and Advanced Practice Providers (APPs -  Physician Assistants and Nurse Practitioners) who all work together to provide you with the care you need, when you need it.  We recommend signing up for the patient portal called "MyChart".  Sign up information is provided on this After Visit Summary.  MyChart is used to connect with patients for Virtual Visits (Telemedicine).  Patients are able to view lab/test results, encounter notes, upcoming appointments, etc.  Non-urgent messages can be sent to your provider as well.   To learn more about what you can do with MyChart, go to [email or web address].    Your next appointment:   2 month(s)  Provider:   Bernadene Person, NP        Other Instructions

## 2022-11-15 NOTE — Progress Notes (Signed)
Office Visit    Patient Name: Janice Arnold Date of Encounter: 11/15/2022  Primary Care Provider:  Jeani Sow, MD Primary Cardiologist:  Nicki Guadalajara, MD  Chief Complaint    42 year old female with a history of QT prolongation, prior alcohol abuse, hyperlipidemia, elevated LFTs, fatty liver, seizure, SDH, GERD, depression, and anxiety who presents for follow-up related to abnormal EKG (QT prolongation) and hyperlipidemia.  Past Medical History    Past Medical History:  Diagnosis Date   Alcohol abuse    hx of heavy alcohol abuse, quit 01/2022   Anemia 2023   s/p iron infusions, most recent as of 07/30/22 was on 05/16/22   Anxiety    Follows with PCP. Dr. Dewitt Rota, LOV 07/08/22. Currently taking Lorazepam and hydroxyzine   Cyclic vomiting syndrome    hospital admission on 05/28/22 for vomiting, follows with gastroenterologist Dr. 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 Tiajuana Amass, LOV  02/21/22 in Epic ( as of 07/31/22).   Hyperlipidemia    07/16/2022 lipid panel in Epic   Menorrhagia    Seizure 06/06/2022   no prior hx of seizures, quit alcohol in 01/2022, follows with neurologist, Dr. 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 See 06/06/22 Head CT and MRI in Epic.   Tremors of nervous system    whole body tremors, follows with Dr. Leim Fabry @ [facility], LOV 07/11/22 in Epic.   Vitamin deficiency 2023   B12 deficiency, receiving monthly B12 injections   Past Surgical History:  Procedure Laterality Date   LAPAROSCOPIC APPENDECTOMY N/A 03/02/2022   Procedure: APPENDECTOMY LAPAROSCOPIC;  Surgeon: Manus Rudd, MD;  Location: [facility];   Service: General;  Laterality: N/A;    Allergies  No Known Allergies   Labs/Other Studies Reviewed    The following studies were reviewed today: Echo 11/2022: IMPRESSIONS     1. Left ventricular ejection fraction, by estimation, is 60 to 65%. The  left ventricle has normal function. The left ventricle has no regional  wall motion abnormalities. There is mild concentric left ventricular  hypertrophy. Left ventricular diastolic  parameters were normal.   2. Right ventricular systolic function is normal. The right ventricular  size is normal. Tricuspid regurgitation signal is inadequate for assessing  PA pressure.   3. The mitral valve is normal in structure. Trivial mitral valve  regurgitation. No evidence of mitral stenosis.   4. The aortic valve is tricuspid. Aortic valve regurgitation is not  visualized. No aortic stenosis is present.   5. The inferior vena cava is normal in size with greater than 50%  respiratory variability, suggesting right atrial pressure of 3 mmHg.    Recent Labs: 10/15/2022: Magnesium 1.7; TSH 4.27 11/12/2022: ALT 18; BUN 13; Creatinine, Ser 0.65; Hemoglobin 11.2; Platelets 235.0; Potassium 3.6; Sodium 136  Recent Lipid Panel    Component Value Date/Time   CHOL 238 (H) 07/16/2022 0939   TRIG 104 07/16/2022 0939   HDL 32 (L) 07/16/2022 0939   CHOLHDL 7.4 (H) 07/16/2022 0939   CHOLHDL 3 10/06/2019 0909   VLDL 24.6 10/06/2019 0909   LDLCALC 187 (H) 07/16/2022 2458  History of Present Illness    42 year old female with the above past medical history including QT prolongation, prior alcohol abuse, hyperlipidemia, elevated LFTs, fatty liver, seizure, SDH, GERD, depression, and anxiety.  She has a history of prior alcohol abuse, quit cold [place] in June 2023.  She has a history of cyclical vomiting and takes as needed Zofran, Phenergan.  She was hospitalized in October 2023 for seizures.  Head CT showed right cerebral convexity chronic subdural  hematoma, hygroma measuring 1.3 cm in maximal thickness with mild mass defect and 3 mm right to left midline shift.  There was no acute intracranial hemorrhage.  She was started on Keppra, however, this was later discontinued per neurology.  She saw her PCP who noted concern for QT prolongation.  Lexapro was discontinued.  She was referred to cardiology in the setting of QT prolongation.  She was last seen in office on 07/15/2022 by Dr. Tresa EndoKelly.  EKG was minimally increased at 470 ms.  She was started on metoprolol in the setting of sinus tachycardia.  She also reported symptoms concerning for OSA and was scheduled for split-night sleep study.  She was started on Zetia.  Echocardiogram showed EF 60 to 65%, normal LV function, no RWMA, mild concentric LVH, normal RV systolic function, no significant valvular abnormalities.  She apparently had a seizure and sustained a compression fracture at the T5 level.  She is scheduled for follow-up MRI per PCP.  She presents today for follow-up.  Since her last visit she has been stable from a cardiac standpoint.  She notes an increase in palpitations recently.  She will have palpitations for hours at a time with associated chest tightness, mild shortness of breath.  These episodes occur approximately 5-6 times a week.  Will check 14-day ZIO.  Sleep study is pending.  Will check fasting lipid panel, LFTs.  Follow-up in 2 months.  Denies any exertional symptoms concerning for angina.  She has dependent nonpitting bilateral lower extremity edema mild.  QT prolongation: Hyperlipidemia/elevated LFTs: Prior alcohol abuse: History of seizure: History of snoring: Disposition:  Home Medications    Current Outpatient Medications  Medication Sig Dispense Refill   aMILoride (MIDAMOR) 5 MG tablet TAKE 1 TABLET BY MOUTH DAILY AS NEEDED. 30 tablet 0   ezetimibe (ZETIA) 10 MG tablet Take 1 tablet (10 mg total) by mouth daily. 90 tablet 3   hydrOXYzine (ATARAX) 10 MG tablet TAKE  1 TABLET BY MOUTH EVERY 6 HOURS AS NEEDED 120 tablet 3   KLOR-CON M20 20 MEQ tablet Take 2 tablets (40 mEq total) by mouth 3 (three) times daily. 180 tablet 1   LOW-OGESTREL 0.3-30 MG-MCG tablet Take 1 tablet by mouth daily.     melatonin 1 MG TABS tablet Take 1 mg by mouth at bedtime as needed (sleep).     metoprolol succinate (TOPROL XL) 25 MG 24 hr tablet Take 1 tablet (25 mg total) by mouth daily. 90 tablet 3   norethindrone (AYGESTIN) 5 MG tablet Take 10 mg by mouth daily.     omeprazole (PRILOSEC) 40 MG capsule Take 1 capsule (40 mg total) by mouth daily. 90 capsule 1   oxycodone (OXY-IR) 5 MG capsule Take 1 capsule (5 mg total) by mouth every 4 (four) hours as needed. 20 capsule 0   sertraline (ZOLOFT) 100 MG tablet Take 1 tablet (100 mg total) by mouth daily. 90 tablet 1   Vitamin D, Ergocalciferol, (DRISDOL) 1.25 MG (50000 UNIT) CAPS capsule Take 1 capsule (50,000 Units  total) by mouth every 7 (seven) days. TAKE 1 CAPSULE (50,000 UNITS) BY MOUTH EVERY 7 DAYS Strength: 1.25 mg 12 capsule 0   No current facility-administered medications for this visit.     Review of Systems    He denies chest pain, palpitations, dyspnea, pnd, orthopnea, n, v, dizziness, syncope, edema, weight gain, or early satiety. All other systems reviewed and are otherwise negative except as noted above.   Physical Exam    VS:  BP 108/68   Pulse 92   Ht 5\' 7"  (1.702 m)   Wt 224 lb 3.2 oz (101.7 kg)   LMP 11/12/2022   SpO2 99%   BMI 35.11 kg/m  GEN: Well nourished, well developed, in no acute distress. HEENT: normal. Neck: Supple, no JVD, carotid bruits, or masses. Cardiac: RRR, no murmurs, rubs, or gallops. No clubbing, cyanosis, edema.  Radials/DP/PT 2+ and equal bilaterally.  Respiratory:  Respirations regular and unlabored, clear to auscultation bilaterally. GI: Soft, nontender, nondistended, BS + x 4. MS: no deformity or atrophy. Skin: warm and dry, no rash. Neuro:  Strength and sensation are  intact. Psych: Normal affect.  Accessory Clinical Findings    ECG personally reviewed by me today -NSR, 99 bpm, QTc 444 ms- no acute changes.   Lab Results  Component Value Date   WBC 6.2 11/12/2022   HGB 11.2 (L) 11/12/2022   HCT 33.1 (L) 11/12/2022   MCV 108.4 (H) 11/12/2022   PLT 235.0 11/12/2022   Lab Results  Component Value Date   CREATININE 0.65 11/12/2022   BUN 13 11/12/2022   NA 136 11/12/2022   K 3.6 11/12/2022   CL 103 11/12/2022   CO2 24 11/12/2022   Lab Results  Component Value Date   ALT 18 11/12/2022   AST 34 11/12/2022   ALKPHOS 47 11/12/2022   BILITOT 1.0 11/12/2022   Lab Results  Component Value Date   CHOL 238 (H) 07/16/2022   HDL 32 (L) 07/16/2022   LDLCALC 187 (H) 07/16/2022   TRIG 104 07/16/2022   CHOLHDL 7.4 (H) 07/16/2022    Lab Results  Component Value Date   HGBA1C <4.2 10/15/2022    Assessment & Plan    1.  ***      Joylene GrapesEmily C Jerolyn Flenniken, NP 11/15/2022, 1:17 PM

## 2022-11-16 ENCOUNTER — Encounter: Payer: Self-pay | Admitting: Family

## 2022-11-16 ENCOUNTER — Other Ambulatory Visit: Payer: Self-pay | Admitting: Neurology

## 2022-11-16 MED ORDER — LEVETIRACETAM 500 MG PO TABS: 500.0000 mg | ORAL_TABLET | Freq: Two times a day (BID) | ORAL | 3 refills | Status: DC

## 2022-11-17 ENCOUNTER — Encounter: Payer: Self-pay | Admitting: Nurse Practitioner

## 2022-11-20 ENCOUNTER — Other Ambulatory Visit: Payer: Self-pay | Admitting: Family Medicine

## 2022-11-20 ENCOUNTER — Telehealth: Payer: Self-pay | Admitting: Family Medicine

## 2022-11-20 DIAGNOSIS — S22000A Wedge compression fracture of unspecified thoracic vertebra, initial encounter for closed fracture: Secondary | ICD-10-CM

## 2022-11-20 MED ORDER — OXYCODONE HCL 5 MG PO CAPS: 5.0000 mg | ORAL_CAPSULE | ORAL | 0 refills | Status: DC | PRN

## 2022-11-20 NOTE — Telephone Encounter (Signed)
  LAST APPOINTMENT DATE:  11/12/22  NEXT APPOINTMENT DATE: 12/10/22  MEDICATION:  oxycodone (OXY-IR) 5 MG capsule   Is the patient out of medication? 1 capsule left  PHARMACY:   [id number] [place], [place] - [address] Phone: [phone]  Fax: [phone]

## 2022-11-20 NOTE — Progress Notes (Signed)
Does have fracture.  MRI 4/13

## 2022-11-20 NOTE — Telephone Encounter (Signed)
Please see message below

## 2022-11-21 LAB — LIPID PANEL
Chol/HDL Ratio: 4.9 ratio — ABNORMAL HIGH (ref 0.0–4.4)
Cholesterol, Total: 229 mg/dL — ABNORMAL HIGH (ref 100–199)
HDL: 47 mg/dL (ref >39)
LDL Chol Calc (NIH): 153 mg/dL — ABNORMAL HIGH (ref 0–99)
Triglycerides: 159 mg/dL — ABNORMAL HIGH (ref 0–149)
VLDL Cholesterol Cal: 29 mg/dL (ref 5–40)

## 2022-11-21 LAB — HEPATIC FUNCTION PANEL
ALT: 21 IU/L (ref 0–32)
AST: 45 IU/L — ABNORMAL HIGH (ref 0–40)
Albumin: 4.3 g/dL (ref 3.9–4.9)
Alkaline Phosphatase: 66 IU/L (ref 44–121)
Bilirubin Total: 0.7 mg/dL (ref 0.0–1.2)
Bilirubin, Direct: 0.41 mg/dL — ABNORMAL HIGH (ref 0.00–0.40)
Total Protein: 6.3 g/dL (ref 6.0–8.5)

## 2022-11-23 ENCOUNTER — Inpatient Hospital Stay: Admission: RE | Admit: 2022-11-23 | Payer: Managed Care, Other (non HMO) | Source: Ambulatory Visit

## 2022-11-25 NOTE — Addendum Note (Signed)
Addended by: Angelena Sole on: 11/25/2022 07:59 AM   Modules accepted: Orders

## 2022-11-26 DIAGNOSIS — R002 Palpitations: Secondary | ICD-10-CM

## 2022-11-27 ENCOUNTER — Telehealth: Payer: Self-pay

## 2022-11-27 ENCOUNTER — Encounter: Payer: Self-pay | Admitting: Orthopedic Surgery

## 2022-11-27 ENCOUNTER — Ambulatory Visit (INDEPENDENT_AMBULATORY_CARE_PROVIDER_SITE_OTHER): Payer: Managed Care, Other (non HMO) | Admitting: Orthopedic Surgery

## 2022-11-27 VITALS — BP 124/84 | HR 90 | Ht 67.0 in | Wt 216.5 lb

## 2022-11-27 DIAGNOSIS — S22000A Wedge compression fracture of unspecified thoracic vertebra, initial encounter for closed fracture: Secondary | ICD-10-CM

## 2022-11-27 MED ORDER — OXYCODONE HCL 5 MG PO TABS: 5.0000 mg | ORAL_TABLET | ORAL | 0 refills | Status: AC | PRN

## 2022-11-27 NOTE — Telephone Encounter (Signed)
Spoke with pt. Pt was notified of lab results and recommendations of modifications with diet and exercise. Pt will continue current medication and follow up as planned.
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
## 2022-11-28 ENCOUNTER — Encounter: Payer: Self-pay | Admitting: Family

## 2022-12-01 ENCOUNTER — Other Ambulatory Visit: Payer: Self-pay | Admitting: Family Medicine

## 2022-12-03 ENCOUNTER — Ambulatory Visit: Payer: Managed Care, Other (non HMO) | Admitting: Gastroenterology

## 2022-12-05 ENCOUNTER — Encounter: Payer: Self-pay | Admitting: Orthopedic Surgery

## 2022-12-07 ENCOUNTER — Other Ambulatory Visit: Payer: Self-pay | Admitting: Family Medicine

## 2022-12-07 DIAGNOSIS — R112 Nausea with vomiting, unspecified: Secondary | ICD-10-CM

## 2022-12-09 ENCOUNTER — Other Ambulatory Visit: Payer: Self-pay | Admitting: Family Medicine

## 2022-12-09 ENCOUNTER — Encounter: Payer: Self-pay | Admitting: Orthopedic Surgery

## 2022-12-09 MED ORDER — OXYCODONE HCL 5 MG PO TABS: 5.0000 mg | ORAL_TABLET | ORAL | 0 refills | Status: AC | PRN

## 2022-12-09 MED ORDER — VITAMIN D (ERGOCALCIFEROL) 1.25 MG (50000 UNIT) PO CAPS: 50000.0000 [IU] | ORAL_CAPSULE | ORAL | 0 refills | Status: DC

## 2022-12-10 ENCOUNTER — Encounter: Payer: Self-pay | Admitting: Neurology

## 2022-12-10 ENCOUNTER — Ambulatory Visit: Payer: Managed Care, Other (non HMO) | Admitting: Family Medicine

## 2022-12-12 ENCOUNTER — Ambulatory Visit
Admission: RE | Admit: 2022-12-12 | Discharge: 2022-12-12 | Disposition: A | Discharge status: Home / Self Care | Payer: Self-pay | Source: Ambulatory Visit | Attending: Orthopedic Surgery | Admitting: Orthopedic Surgery

## 2022-12-12 DIAGNOSIS — S22000A Wedge compression fracture of unspecified thoracic vertebra, initial encounter for closed fracture: Secondary | ICD-10-CM

## 2022-12-16 ENCOUNTER — Ambulatory Visit: Payer: Managed Care, Other (non HMO) | Admitting: Family Medicine

## 2022-12-19 ENCOUNTER — Encounter: Payer: Self-pay | Admitting: Orthopedic Surgery

## 2022-12-19 ENCOUNTER — Telehealth: Payer: Self-pay | Admitting: Orthopedic Surgery

## 2022-12-19 MED ORDER — OXYCODONE HCL 5 MG PO TABS: 5.0000 mg | ORAL_TABLET | ORAL | 0 refills | Status: AC | PRN

## 2022-12-19 NOTE — Telephone Encounter (Signed)
FYI.Marland KitchenMarland KitchenMarland KitchenMarland KitchenPatient OBGYN Dr. Billy Coast  is scheduling pt for surgery in July and will be faxing over a Clearance form

## 2022-12-20 ENCOUNTER — Ambulatory Visit (INDEPENDENT_AMBULATORY_CARE_PROVIDER_SITE_OTHER): Payer: Managed Care, Other (non HMO) | Admitting: Family Medicine

## 2022-12-20 ENCOUNTER — Other Ambulatory Visit: Payer: Managed Care, Other (non HMO)

## 2022-12-20 ENCOUNTER — Other Ambulatory Visit: Payer: Self-pay | Admitting: *Deleted

## 2022-12-20 DIAGNOSIS — I159 Secondary hypertension, unspecified: Secondary | ICD-10-CM | POA: Diagnosis not present

## 2022-12-20 DIAGNOSIS — E876 Hypokalemia: Secondary | ICD-10-CM

## 2022-12-20 LAB — COMPREHENSIVE METABOLIC PANEL
ALT: 39 U/L — ABNORMAL HIGH (ref 0–35)
AST: 53 U/L — ABNORMAL HIGH (ref 0–37)
Albumin: 4.2 g/dL (ref 3.5–5.2)
Alkaline Phosphatase: 49 U/L (ref 39–117)
BUN: 10 mg/dL (ref 6–23)
CO2: 22 mEq/L (ref 19–32)
Calcium: 9.2 mg/dL (ref 8.4–10.5)
Chloride: 106 mEq/L (ref 96–112)
Creatinine, Ser: 0.57 mg/dL (ref 0.40–1.20)
GFR: 112.88 mL/min (ref >60.00)
Glucose, Bld: 86 mg/dL (ref 70–99)
Potassium: 3.8 mEq/L (ref 3.5–5.1)
Sodium: 138 mEq/L (ref 135–145)
Total Bilirubin: 0.8 mg/dL (ref 0.2–1.2)
Total Protein: 6.7 g/dL (ref 6.0–8.3)

## 2022-12-20 LAB — CBC WITH DIFFERENTIAL/PLATELET
Basophils Absolute: 0 10*3/uL (ref 0.0–0.1)
Basophils Relative: 0.7 % (ref 0.0–3.0)
Eosinophils Absolute: 0.2 10*3/uL (ref 0.0–0.7)
Eosinophils Relative: 2.6 % (ref 0.0–5.0)
HCT: 34.7 % — ABNORMAL LOW (ref 36.0–46.0)
Hemoglobin: 11.4 g/dL — ABNORMAL LOW (ref 12.0–15.0)
Lymphocytes Relative: 32 % (ref 12.0–46.0)
Lymphs Abs: 2 10*3/uL (ref 0.7–4.0)
MCHC: 32.9 g/dL (ref 30.0–36.0)
MCV: 97.1 fl (ref 78.0–100.0)
Monocytes Absolute: 0.6 10*3/uL (ref 0.1–1.0)
Monocytes Relative: 9.5 % (ref 3.0–12.0)
Neutro Abs: 3.4 10*3/uL (ref 1.4–7.7)
Neutrophils Relative %: 55.2 % (ref 43.0–77.0)
Platelets: 279 10*3/uL (ref 150.0–400.0)
RBC: 3.57 Mil/uL — ABNORMAL LOW (ref 3.87–5.11)
RDW: 17.6 % — ABNORMAL HIGH (ref 11.5–15.5)
WBC: 6.1 10*3/uL (ref 4.0–10.5)

## 2022-12-20 LAB — MAGNESIUM: Magnesium: 1.7 mg/dL (ref 1.5–2.5)

## 2022-12-21 NOTE — Progress Notes (Signed)
Patient showed up late for appointment.  Not seen but due to chronic lab abnormalities, ordered labs.  Resch for next wk

## 2022-12-23 ENCOUNTER — Ambulatory Visit (INDEPENDENT_AMBULATORY_CARE_PROVIDER_SITE_OTHER): Payer: Managed Care, Other (non HMO) | Admitting: Family Medicine

## 2022-12-23 ENCOUNTER — Telehealth: Payer: Self-pay | Admitting: Family Medicine

## 2022-12-23 ENCOUNTER — Encounter: Payer: Self-pay | Admitting: Family Medicine

## 2022-12-23 VITALS — BP 130/82 | HR 77 | Temp 97.9°F | Resp 18 | Ht 67.0 in | Wt 227.5 lb

## 2022-12-23 DIAGNOSIS — R112 Nausea with vomiting, unspecified: Secondary | ICD-10-CM

## 2022-12-23 DIAGNOSIS — E538 Deficiency of other specified B group vitamins: Secondary | ICD-10-CM

## 2022-12-23 DIAGNOSIS — R600 Localized edema: Secondary | ICD-10-CM | POA: Diagnosis not present

## 2022-12-23 DIAGNOSIS — R569 Unspecified convulsions: Secondary | ICD-10-CM

## 2022-12-23 DIAGNOSIS — S22000A Wedge compression fracture of unspecified thoracic vertebra, initial encounter for closed fracture: Secondary | ICD-10-CM

## 2022-12-23 DIAGNOSIS — F4323 Adjustment disorder with mixed anxiety and depressed mood: Secondary | ICD-10-CM

## 2022-12-23 DIAGNOSIS — Z79899 Other long term (current) drug therapy: Secondary | ICD-10-CM

## 2022-12-23 DIAGNOSIS — E876 Hypokalemia: Secondary | ICD-10-CM

## 2022-12-23 MED ORDER — SPIRONOLACTONE 25 MG PO TABS: 25.0000 mg | ORAL_TABLET | Freq: Every day | ORAL | 3 refills | Status: DC

## 2022-12-23 MED ORDER — CYANOCOBALAMIN 1000 MCG/ML IJ SOLN
1000.0000 ug | Freq: Once | INTRAMUSCULAR | Status: AC
Start: 2022-12-23 — End: 2022-12-23
  Administered 2022-12-23: 1000 ug via INTRAMUSCULAR

## 2022-12-23 MED ORDER — DULOXETINE HCL 60 MG PO CPEP: 60.0000 mg | ORAL_CAPSULE | Freq: Every day | ORAL | 1 refills | Status: DC

## 2022-12-23 MED ORDER — AMILORIDE HCL 5 MG PO TABS: 5.0000 mg | ORAL_TABLET | Freq: Every day | ORAL | 0 refills | Status: DC | PRN

## 2022-12-23 NOTE — Progress Notes (Signed)
Subjective:     Patient ID: Janice Arnold, female    DOB: 02/02/1981, 42 y.o.   MRN: [id number]  Chief Complaint  Patient presents with   Follow-up    4 week follow-up  Hysterectomy scheduled for 03/04/23    HPI  Follow up   cyclic vomiting-missed gastroenterology appointment - per patient didn't get reminder.  Takes anti-nausea medications daily.   Hyst  schedule 03/04/23 Seizure(s)-seeing neurology on 5/30-doing better  none since March/April.  Fracture T spine-seeing Dr. Christell Constant orthopedic-apt on 5/22. Had heart monitor and just sent back Edema-still  despite amiloride.  Gets tired easily especially going up stairs and heart racing.  Taking K 2 tabs three times daily    Health Maintenance Due  Topic Date Due   MAMMOGRAM  04/29/2020    Past Medical History:  Diagnosis Date   Alcohol abuse    hx of heavy alcohol abuse, quit 01/2022   Anemia 2023   s/p iron infusions, most recent as of 07/30/22 was on 05/16/22   Anxiety    Follows with PCP. Dr. Dewitt Rota, LOV 07/08/22. Currently taking Lorazepam and hydroxyzine   Cyclic vomiting syndrome    hospital admission on 05/28/22 for vomiting, follows with gastroenterologist Dr. 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 Tiajuana Amass, LOV  02/21/22 in Epic ( as of 07/31/22).   Hyperlipidemia    07/16/2022 lipid panel in Epic   Menorrhagia    Seizure (HCC) 06/06/2022   no prior hx of seizures, quit alcohol in 01/2022, follows with neurologist, Dr. 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 See 06/06/22 Head CT and MRI in Epic.   Tremors of nervous system    whole body tremors, follows with Dr. Leim Fabry @ [facility], LOV 07/11/22 in Epic.   Vitamin deficiency 2023   B12 deficiency, receiving monthly B12 injections    Past Surgical History:  Procedure Laterality Date   LAPAROSCOPIC APPENDECTOMY N/A 03/02/2022   Procedure: APPENDECTOMY LAPAROSCOPIC;  Surgeon: Manus Rudd, MD;  Location: [facility];  Service: General;  Laterality: N/A;     Current Outpatient Medications:    DULoxetine (CYMBALTA) 60 MG capsule, Take 1 capsule (60 mg total) by mouth daily., Disp: 30 capsule, Rfl: 1   ezetimibe (ZETIA) 10 MG tablet, Take 1 tablet (10 mg total) by mouth daily., Disp: 90 tablet, Rfl: 3   hydrOXYzine (ATARAX) 10 MG tablet, TAKE 1 TABLET BY MOUTH EVERY 6 HOURS AS NEEDED, Disp: 120 tablet, Rfl: 3   KLOR-CON M20 20 MEQ tablet, Take 2 tablets (40 mEq total) by mouth 3 (three) times daily., Disp: 180 tablet, Rfl: 1   levETIRAcetam (KEPPRA) 500 MG tablet, Take 1 tablet (500 mg total) by mouth 2 (two) times daily., Disp: 60 tablet, Rfl: 3   melatonin 1 MG TABS tablet, Take 1 mg by mouth at bedtime as needed (sleep)., Disp: , Rfl:    metoprolol succinate (TOPROL XL) 25 MG 24 hr tablet, Take 1 tablet (25 mg total) by mouth daily., Disp: 90 tablet, Rfl: 3   norgestimate-ethinyl estradiol (ORTHO-CYCLEN) 0.25-35 MG-MCG tablet, Take 1 tablet by mouth daily., Disp: ,  Rfl:    omeprazole (PRILOSEC) 40 MG capsule, Take 1 capsule (40 mg total) by mouth daily., Disp: 90 capsule, Rfl: 1   oxyCODONE (ROXICODONE) 5 MG immediate release tablet, Take 1 tablet (5 mg total) by mouth every 4 (four) hours as needed for up to 5 days for severe pain., Disp: 30 tablet, Rfl: 0   spironolactone (ALDACTONE) 25 MG tablet, Take 1 tablet (25 mg total) by mouth daily., Disp: 90 tablet, Rfl: 3   Vitamin D, Ergocalciferol, (DRISDOL) 1.25 MG (50000 UNIT) CAPS capsule, Take 1 capsule (50,000 Units total) by mouth every 7 (seven) days. TAKE 1 CAPSULE (50,000 UNITS) BY MOUTH EVERY 7 DAYS Strength: 1.25 mg, Disp: 12 capsule, Rfl: 0   aMILoride  (MIDAMOR) 5 MG tablet, Take 1 tablet (5 mg total) by mouth daily as needed., Disp: 90 tablet, Rfl: 0  No Known Allergies ROS neg/noncontributory except as noted HPI/below      Objective:     BP 130/82   Pulse 77   Temp 97.9 F (36.6 C) (Temporal)   Resp 18   Ht 5\' 7"  (1.702 m)   Wt 227 lb 8 oz (103.2 kg)   LMP 12/19/2022 (Exact Date)   SpO2 99%   BMI 35.63 kg/m  Wt Readings from Last 3 Encounters:  12/23/22 227 lb 8 oz (103.2 kg)  11/27/22 216 lb 8 oz (98.2 kg)  11/15/22 224 lb 3.2 oz (101.7 kg)    Physical Exam   Gen: WDWN NAD HEENT: NCAT, conjunctiva not injected, sclera nonicteric NECK:  supple, no thyromegaly, no nodes, no carotid bruits CARDIAC: RRR, S1S2+, no murmur. DP 2+B LUNGS: CTAB. No wheezes ABDOMEN:  BS+, soft, NTND, No HSM, no masses EXT:  no edema MSK: no gross abnormalities.  NEURO: A&O x3.  CN II-XII intact.  PSYCH: normal mood. Good eye contact  Patient w/complex problems.  Spent 35 minutes w/patient reviewing lab, discussing changing/adding medications, etc.      Assessment & Plan:  Intractable nausea and vomiting  B12 deficiency -     Cyanocobalamin  Local edema  Adjustment disorder with mixed anxiety and depressed mood  Hypokalemia  Seizure (HCC)  Compression fracture of body of thoracic vertebra (HCC)  High risk medication use -     Comprehensive metabolic panel; Future  Other orders -     aMILoride HCl; Take 1 tablet (5 mg total) by mouth daily as needed.  Dispense: 90 tablet; Refill: 0 -     DULoxetine HCl; Take 1 capsule (60 mg total) by mouth daily.  Dispense: 30 capsule; Refill: 1 -     Spironolactone; Take 1 tablet (25 mg total) by mouth daily.  Dispense: 90 tablet; Refill: 3  1.  Cyclic vomiting syndrome-doing better, however taking antiemetics daily and increases amount if having vomiting.  She missed her appointment with GI.  Already rescheduled. 2.  Elevated LFTs-not sure if all due to fatty liver, other.  Has missed  her appointment with GI.  Has not rescheduled. 3.  Localized edema-patient states he amiloride is not as good as the hydrochlorothiazide.  However, she was losing a lot of potassium.  She is currently taking 40 mEq 3 times daily.  Will add on spironolactone 25 mg.  Decrease potassium by 2 tabs per day.  Recheck BMP in 2 weeks.  Need to consider referral to nephrology versus endocrinology 4.  Hypokalemia-originally thought to be due to cyclical vomiting.  Then thought to be due to hydrochlorothiazide.  She was switched to  amiloride.  This is not controlling her edema, but her potassiums are better, however she is taking 120 mEq/day.  See above. 5.  Vitamin B12 deficiency-currently on monthly B12 injections.  1 was given today.  Continue. 6.  Depression/anxiety-not well-controlled.  She is off Wellbutrin due to the fact that it can decrease the seizure threshold.  Sometimes, she feels like she is on the border of having a seizure.  She has follow-up at the end of the month with neurology.  Need to get that part sorted out before we consider Wellbutrin again.  Will change Zoloft to Cymbalta 60 mg daily.  Offered genetic testing.  Gave her a brochure to take home and contact company.  She will get back with [place]. 7.  Thoracic spine compression fractures-not sure if due to the trauma she had, or potential other etiologies.  Will defer to Ortho. 8.  Patient has a lot of problems, however as far as primary care, we have reached our limit of me being able to keep her out of work.  She will see what orthopedics says on Friday about staying out of work and then they will need to take over that management.  Follow up 1 mo  Angelena Sole, MD

## 2022-12-23 NOTE — Telephone Encounter (Signed)
Please see message below

## 2022-12-23 NOTE — Telephone Encounter (Signed)
[place] with [facility] states Patient does want the [facility] test and an requests an Order be placed

## 2022-12-23 NOTE — Patient Instructions (Signed)
Decrease the potassium by 2/day.   add spironolactone Change Zoloft to Cymbalta.   Talk to orthopedic about time off work

## 2022-12-24 ENCOUNTER — Encounter: Payer: Self-pay | Admitting: *Deleted

## 2022-12-24 NOTE — Telephone Encounter (Signed)
Patient notified of message below. Last paperwork sent has a date of 10/18/22, patient also given Medical Records number to get records from December 2023 to present.

## 2022-12-24 NOTE — Telephone Encounter (Signed)
Patient stated that she will do testing on the day she come in for labs on 01/07/23. Patient also stated that her benefits have been stopped and her insurance company stated they need medical records from December 2023 to present. Patient also wanted to know when would be the last day that she will be written out of work, pending ortho, etc.

## 2022-12-24 NOTE — Telephone Encounter (Signed)
noted 

## 2022-12-25 ENCOUNTER — Encounter: Payer: Self-pay | Admitting: Family

## 2022-12-25 ENCOUNTER — Encounter (HOSPITAL_COMMUNITY): Payer: Self-pay

## 2022-12-25 ENCOUNTER — Other Ambulatory Visit: Payer: Self-pay

## 2022-12-25 ENCOUNTER — Emergency Department (HOSPITAL_COMMUNITY)
Admission: EM | Admit: 2022-12-25 | Discharge: 2022-12-25 | Disposition: A | Discharge status: Home / Self Care | Payer: Managed Care, Other (non HMO) | Attending: Emergency Medicine | Admitting: Emergency Medicine

## 2022-12-25 ENCOUNTER — Other Ambulatory Visit: Payer: Self-pay | Admitting: Family Medicine

## 2022-12-25 DIAGNOSIS — R112 Nausea with vomiting, unspecified: Secondary | ICD-10-CM

## 2022-12-25 DIAGNOSIS — M546 Pain in thoracic spine: Secondary | ICD-10-CM | POA: Diagnosis present

## 2022-12-25 MED ORDER — OXYCODONE-ACETAMINOPHEN 7.5-325 MG PO TABS: 1.0000 | ORAL_TABLET | Freq: Four times a day (QID) | ORAL | 0 refills | Status: DC | PRN

## 2022-12-25 MED ORDER — METHOCARBAMOL 500 MG PO TABS: 500.0000 mg | ORAL_TABLET | Freq: Two times a day (BID) | ORAL | 0 refills | Status: DC

## 2022-12-25 NOTE — ED Provider Notes (Signed)
[place] EMERGENCY DEPARTMENT AT [facility] Provider Note   CSN: [id number] Arrival date & time: 12/25/22  1040     History  Chief Complaint  Patient presents with   Back Pain    Janice Arnold is a 42 y.o. female.  42 year old female presents with worsening thoracic back pain.  Denies any new discharge trauma.  No bowel or bladder dysfunction.  Recently diagnosed with compression fractures of T3 and T5.  Able to ambulate.  Pain unresponsive to her home medications.       Home Medications Prior to Admission medications   Medication Sig Start Date End Date Taking? Authorizing Provider  methocarbamol (ROBAXIN) 500 MG tablet Take 1 tablet (500 mg total) by mouth 2 (two) times daily. 12/25/22  Yes Lorre Nick, MD  oxyCODONE-acetaminophen (PERCOCET) 7.5-325 MG tablet Take 1 tablet by mouth every 6 (six) hours as needed for severe pain. 12/25/22  Yes Lorre Nick, MD  aMILoride (MIDAMOR) 5 MG tablet Take 1 tablet (5 mg total) by mouth daily as needed. 12/23/22   Jeani Sow, MD  DULoxetine (CYMBALTA) 60 MG capsule Take 1 capsule (60 mg total) by mouth daily. 12/23/22   Jeani Sow, MD  ezetimibe (ZETIA) 10 MG tablet Take 1 tablet (10 mg total) by mouth daily. 10/21/22 10/16/23  Lennette Bihari, MD  hydrOXYzine (ATARAX) 10 MG tablet TAKE 1 TABLET BY MOUTH EVERY 6 HOURS AS NEEDED 11/07/22   Jeani Sow, MD  KLOR-CON M20 20 MEQ tablet Take 2 tablets (40 mEq total) by mouth 3 (three) times daily. 10/15/22   Jeani Sow, MD  levETIRAcetam (KEPPRA) 500 MG tablet Take 1 tablet (500 mg total) by mouth 2 (two) times daily. 11/16/22 03/16/23  Windell Norfolk, MD  melatonin 1 MG TABS tablet Take 1 mg by mouth at bedtime as needed (sleep).    [provider]  metoprolol succinate (TOPROL XL) 25 MG 24 hr tablet Take 1 tablet (25 mg total) by mouth daily. 07/15/22   Lennette Bihari, MD  norgestimate-ethinyl estradiol (ORTHO-CYCLEN) 0.25-35 MG-MCG tablet Take 1 tablet  by mouth daily. 12/12/22   [provider]  omeprazole (PRILOSEC) 40 MG capsule Take 1 capsule (40 mg total) by mouth daily. 10/15/22   Jeani Sow, MD  spironolactone (ALDACTONE) 25 MG tablet Take 1 tablet (25 mg total) by mouth daily. 12/23/22   Jeani Sow, MD  Vitamin D, Ergocalciferol, (DRISDOL) 1.25 MG (50000 UNIT) CAPS capsule Take 1 capsule (50,000 Units total) by mouth every 7 (seven) days. TAKE 1 CAPSULE (50,000 UNITS) BY MOUTH EVERY 7 DAYS Strength: 1.25 mg 12/09/22   Jeani Sow, MD      Allergies    Patient has no known allergies.    Review of Systems   Review of Systems  All other systems reviewed and are negative.   Physical Exam Updated Vital Signs BP 119/72 (BP Location: Right Arm)   Pulse 96   Temp 98.6 F (37 C) (Oral)   Resp 17   Wt 103 kg   LMP 12/19/2022 (Exact Date)   SpO2 97%   BMI 35.56 kg/m  Physical Exam Vitals and nursing note reviewed.  Constitutional:      General: She is not in acute distress.    Appearance: Normal appearance. She is well-developed. She is not toxic-appearing.  HENT:     Head: Normocephalic and atraumatic.  Eyes:     General: Lids are normal.     Conjunctiva/sclera:  Conjunctivae normal.     Pupils: Pupils are equal, round, and reactive to light.  Neck:     Thyroid: No thyroid mass.     Trachea: No tracheal deviation.  Cardiovascular:     Rate and Rhythm: Normal rate and regular rhythm.     Heart sounds: Normal heart sounds. No murmur heard.    No gallop.  Pulmonary:     Effort: Pulmonary effort is normal. No respiratory distress.     Breath sounds: Normal breath sounds. No stridor. No decreased breath sounds, wheezing, rhonchi or rales.  Abdominal:     General: There is no distension.     Palpations: Abdomen is soft.     Tenderness: There is no abdominal tenderness. There is no rebound.  Musculoskeletal:        General: No tenderness. Normal range of motion.     Cervical back: Normal range of motion  and neck supple.       Back:  Skin:    General: Skin is warm and dry.     Findings: No abrasion or rash.  Neurological:     General: No focal deficit present.     Mental Status: She is alert and oriented to person, place, and time. Mental status is at baseline.     GCS: GCS eye subscore is 4. GCS verbal subscore is 5. GCS motor subscore is 6.     Cranial Nerves: No cranial nerve deficit.     Sensory: No sensory deficit.     Motor: Motor function is intact.     Gait: Gait is intact.  Psychiatric:        Attention and Perception: Attention normal.        Speech: Speech normal.        Behavior: Behavior normal.     ED Results / Procedures / Treatments   Labs (all labs ordered are listed, but only abnormal results are displayed) Labs Reviewed - No data to display  EKG None  Radiology No results found.  Procedures Procedures    Medications Ordered in ED Medications - No data to display  ED Course/ Medical Decision Making/ A&P                             Medical Decision Making Risk Prescription drug management.   Patient with worsening chronic back pain.  No new focal neurological deficits.  Do not feel that she needs imaging at this time.  Prescribe new medications and discharge home.  Patient to follow-up with her doctor        Final Clinical Impression(s) / ED Diagnoses Final diagnoses:  Midline thoracic back pain, unspecified chronicity    Rx / DC Orders ED Discharge Orders          Ordered    methocarbamol (ROBAXIN) 500 MG tablet  2 times daily        12/25/22 1139    oxyCODONE-acetaminophen (PERCOCET) 7.5-325 MG tablet  Every 6 hours PRN        12/25/22 1139              Lorre Nick, MD 12/25/22 1140

## 2022-12-25 NOTE — ED Triage Notes (Addendum)
C/o waking up with increased back pain this am.  Oxycodone 5mg  and tylenol 1000mg  at 8 am w/o relief Pt reports currently seeing ortho for compression fracture.  Denies fall/trauma

## 2022-12-26 ENCOUNTER — Other Ambulatory Visit: Payer: Self-pay | Admitting: *Deleted

## 2022-12-26 MED ORDER — ONDANSETRON 4 MG PO TBDP: 4.0000 mg | ORAL_TABLET | Freq: Three times a day (TID) | ORAL | 0 refills | Status: DC | PRN

## 2022-12-27 ENCOUNTER — Telehealth: Payer: Self-pay | Admitting: Orthopedic Surgery

## 2022-12-27 ENCOUNTER — Telehealth: Payer: Self-pay

## 2022-12-27 NOTE — Telephone Encounter (Signed)
Patient called stating that she is in a lot of pain with her back and was seen at [facility] ED on 12/25/2022.  Would like to have a CT or MRI done?  CB# [phone].  Please advise.  Thank you.

## 2022-12-27 NOTE — Telephone Encounter (Signed)
Have called twice and left voice mails to call back.

## 2022-12-27 NOTE — Telephone Encounter (Signed)
Conversing with patient through a previous message -- this is a duplicate.

## 2022-12-27 NOTE — Telephone Encounter (Signed)
Patient called needing a call back concerning her back. Patient said her back is hurting real bad and she need a call back as soon as possible. Please see previous note from Dr. Christell Constant  The number to contact patient is [phone]

## 2022-12-27 NOTE — Telephone Encounter (Signed)
[facility] called and would like a call back @ [phone]

## 2022-12-30 NOTE — Telephone Encounter (Signed)
Patient never called back. She can follow up with Dr. Christell Constant at her ov on 01/01/23.

## 2023-01-01 ENCOUNTER — Other Ambulatory Visit: Payer: Self-pay

## 2023-01-01 ENCOUNTER — Ambulatory Visit (INDEPENDENT_AMBULATORY_CARE_PROVIDER_SITE_OTHER): Payer: Managed Care, Other (non HMO) | Admitting: Orthopedic Surgery

## 2023-01-01 ENCOUNTER — Other Ambulatory Visit: Payer: Self-pay | Admitting: Family Medicine

## 2023-01-01 DIAGNOSIS — S22000A Wedge compression fracture of unspecified thoracic vertebra, initial encounter for closed fracture: Secondary | ICD-10-CM

## 2023-01-01 MED ORDER — METHOCARBAMOL 500 MG PO TABS: 500.0000 mg | ORAL_TABLET | Freq: Three times a day (TID) | ORAL | 0 refills | Status: DC | PRN

## 2023-01-01 MED ORDER — OXYCODONE HCL 5 MG PO TABS: 5.0000 mg | ORAL_TABLET | ORAL | 0 refills | Status: AC | PRN

## 2023-01-01 NOTE — Progress Notes (Signed)
Orthopedic Spine Surgery Office Note  Assessment: Patient is a 42 y.o. female with T4, T5, T6 compression fractures Date of injury: 11/05/2022 (~7 weeks from injury)    Plan: -Patient has tried activity modification, tylenol, ibuprofen, oxycodone, robaxin -Prescribed oxycodone and robaxin for pain relief -Order MRI of the lumbar spine since she has had pain for over 6 weeks now and pain is getting progressively worse which is unexpected for compression fracture -No bending/lifting/twisting greater than 10 pounds -Patient should return to office in 4 weeks, x-rays at next visit: AP/lateral thoracic   Patient expressed understanding of the plan and all questions were answered to the patient's satisfaction.   ___________________________________________________________________________  History: Patient is a 42 y.o. female who has been previously seen in the office for thoracic back pain.  She comes in today with worsening pain in her thoracic spine.  She feels it in the interscapular region.  There is no recent trauma or injury that caused worsening of her pain.  The pain was so severe that she went to the emergency department.  She was given some oxycodone which takes the severe pain away but there is still significant pain even when taking that medication.  She does not have any pain radiating into her lower extremities.  She has not noticed any unsteadiness or imbalance with gait.  She is not having any change in bowel or bladder habits.  No saddle anesthesia.  Has not noticed any weakness in her lower extremities.  Previous treatments: activity modification, tylenol, ibuprofen, oxycodone, robaxin  Physical Exam:  General: no acute distress, appears stated age Neurologic: alert, answering questions appropriately, following commands Respiratory: unlabored breathing on room air, symmetric chest rise Psychiatric: appropriate affect, normal cadence to speech   MSK (spine):  -Strength  exam      Left  Right EHL    5/5  5/5 TA    5/5  5/5 GSC    5/5  5/5 Knee extension  5/5  5/5 Hip flexion   5/5  5/5  -Sensory exam    Sensation intact to light touch in L3-S1 nerve distributions of bilateral lower extremities  -Achilles DTR: 2/4 on the left, 2/4 on the right -Patellar tendon DTR: 2/4 on the left, 2/4 on the right  -Straight leg raise: Negative bilaterally -Femoral nerve stretch test: Negative bilaterally -Clonus: no beats bilaterally  Imaging: XR of the thoracic spine from 11/12/2022 was previously independently reviewed and interpreted, showing anterior wedge deformity at T5.  No other fracture seen.  No dislocation seen.  No significant degenerative changes seen.   CT of the thoracic spine from 12/12/2022 was independently reviewed and interpreted, showing wedge deformity with anterior height loss at T4, T5, T6.  Greatest height loss at T6 with irregularity to the endplates, particular the superior endplate.  Vacuum disc phenomenon seen in T6/7 disc space.    Patient name: Janice Arnold Patient MRN: [id number] Date of visit: 01/01/23

## 2023-01-07 ENCOUNTER — Other Ambulatory Visit (INDEPENDENT_AMBULATORY_CARE_PROVIDER_SITE_OTHER): Payer: Managed Care, Other (non HMO)

## 2023-01-07 DIAGNOSIS — Z79899 Other long term (current) drug therapy: Secondary | ICD-10-CM

## 2023-01-07 LAB — COMPREHENSIVE METABOLIC PANEL
ALT: 15 U/L (ref 0–35)
AST: 31 U/L (ref 0–37)
Albumin: 4.1 g/dL (ref 3.5–5.2)
Alkaline Phosphatase: 35 U/L — ABNORMAL LOW (ref 39–117)
BUN: 15 mg/dL (ref 6–23)
CO2: 25 mEq/L (ref 19–32)
Calcium: 9.5 mg/dL (ref 8.4–10.5)
Chloride: 103 mEq/L (ref 96–112)
Creatinine, Ser: 0.69 mg/dL (ref 0.40–1.20)
GFR: 107.76 mL/min (ref >60.00)
Glucose, Bld: 77 mg/dL (ref 70–99)
Potassium: 4.3 mEq/L (ref 3.5–5.1)
Sodium: 138 mEq/L (ref 135–145)
Total Bilirubin: 0.6 mg/dL (ref 0.2–1.2)
Total Protein: 6.7 g/dL (ref 6.0–8.3)

## 2023-01-07 NOTE — Progress Notes (Signed)
Great.  Same medications/doses

## 2023-01-08 ENCOUNTER — Ambulatory Visit
Admission: RE | Admit: 2023-01-08 | Discharge: 2023-01-08 | Disposition: A | Discharge status: Home / Self Care | Payer: Managed Care, Other (non HMO) | Source: Ambulatory Visit | Attending: Orthopedic Surgery | Admitting: Orthopedic Surgery

## 2023-01-08 DIAGNOSIS — S22000A Wedge compression fracture of unspecified thoracic vertebra, initial encounter for closed fracture: Secondary | ICD-10-CM

## 2023-01-09 ENCOUNTER — Ambulatory Visit (INDEPENDENT_AMBULATORY_CARE_PROVIDER_SITE_OTHER): Payer: Managed Care, Other (non HMO) | Admitting: Neurology

## 2023-01-09 ENCOUNTER — Encounter: Payer: Self-pay | Admitting: Neurology

## 2023-01-09 ENCOUNTER — Other Ambulatory Visit: Payer: Self-pay | Admitting: Family Medicine

## 2023-01-09 VITALS — BP 128/80 | HR 80 | Ht 67.0 in | Wt 229.0 lb

## 2023-01-09 DIAGNOSIS — Z5181 Encounter for therapeutic drug level monitoring: Secondary | ICD-10-CM | POA: Diagnosis not present

## 2023-01-09 DIAGNOSIS — R569 Unspecified convulsions: Secondary | ICD-10-CM

## 2023-01-09 MED ORDER — LEVETIRACETAM 750 MG PO TABS: 750.0000 mg | ORAL_TABLET | Freq: Two times a day (BID) | ORAL | 3 refills | Status: DC

## 2023-01-09 NOTE — Patient Instructions (Signed)
Increase Keppra to 750 mg twice daily Will check Keppra level today Routine EEG Follow-up in 6 months or sooner if worse

## 2023-01-09 NOTE — Progress Notes (Signed)
[facility]  PATIENT: Janice Arnold DOB: 1981-06-25  REQUESTING CLINICIAN: Jeani Sow, MD HISTORY FROM: Patient and husband  REASON FOR VISIT: Seizure    HISTORICAL  CHIEF COMPLAINT:  Chief Complaint  Patient presents with   Seizures    Rm13, husband present  Seizures: Had a mild seizure 21/2 weeks [id number] since. Last one was in march    INTERVAL HISTORY 01/09/2023:  Patient presents today for follow-up, last visit was in November; at that time we discontinued Keppra because we thought the seizures were alcohol withdrawal seizures.  Since then she reported having another seizure in March, husband described as a generalized tonic-clonic seizure out of sleep.  The seizure was so severe that she broke her back.  Since then we have restarted her on Keppra and she has not had any generalized seizure but she continued to have jerks and episodes where she freezes and unable to move.  She described these episodes as small seizures.  She is compliant with the Keppra 500 twice daily, denies any side effect of the medication.   HISTORY OF PRESENT ILLNESS:  This is a 42 year old woman past medical history of alcohol abuse, fatty liver, anxiety who is presenting after being admitted to the hospital on October 26 for seizures.  Per husband, it was early in the morning when he noted that patient leg was very stiff, and she was unresponsive.  When he tried to wake her up he noted that her eyes were rolled back, she was making grunting noise and had a generalized tonic-clonic event.  EMS was called.  He reported the seizure lasted about a minute but patient was confused for about 30 minutes.  Husband reported in the ED she has a second seizure.  Per patient, she does have a history of alcohol abuse but her last drink was in June.  She reports stopping alcohol cold [place] and did not have any withdrawal symptoms.  She reported that night of the seizure she went out with her  husband and has a had a couple of seltzer drinks, very little alcohol.  She denies and she was adamant that she is not drinking 1/5 of liquor every day.  She denies any previous history of seizures, denies any history of fall or head trauma in the weeks prior to the seizure. she reports bumping her head 3 years ago but nothing recent. Since leaving the hospital she feels a little anxious and jittery.  She is on Ativan but feels like her tremors are still present.  She reported she has not had any additional drink since the 26.  Denies any seizure or seizure-like activity. Feels like she has brain fog and mild dizziness. She is compliant with the Keppra 500 mg BID.    Handedness: Right handed   Onset: Oct 26  Seizure Type: Generalized   Current frequency: Twice now  Any injuries from seizures: Denies   Seizure risk factors: Alcohol use, Subdural hematoma   Previous ASMs: None   Currenty ASMs: Levetiracetam   ASMs side effects: Denies   Brain Images: Subdural hematoma   Previous EEGs: Normal    OTHER MEDICAL CONDITIONS: History of alcohol abuse, anxiety,   REVIEW OF SYSTEMS: Full 14 system review of systems performed and negative with exception of: As noted in the HPI   ALLERGIES: No Known Allergies  HOME MEDICATIONS: Outpatient Medications Prior to Visit  Medication Sig Dispense Refill   aMILoride (MIDAMOR) 5 MG tablet Take 1 tablet (5  mg total) by mouth daily as needed. 90 tablet 0   DULoxetine (CYMBALTA) 60 MG capsule Take 1 capsule (60 mg total) by mouth daily. 30 capsule 1   ezetimibe (ZETIA) 10 MG tablet Take 1 tablet (10 mg total) by mouth daily. 90 tablet 3   hydrOXYzine (ATARAX) 10 MG tablet TAKE 1 TABLET BY MOUTH EVERY 6 HOURS AS NEEDED 360 tablet 0   KLOR-CON M20 20 MEQ tablet Take 2 tablets (40 mEq total) by mouth 3 (three) times daily. 180 tablet 1   methocarbamol (ROBAXIN) 500 MG tablet Take 1 tablet (500 mg total) by mouth every 8 (eight) hours as needed (pain,  muscle spasms). 70 tablet 0   metoprolol succinate (TOPROL XL) 25 MG 24 hr tablet Take 1 tablet (25 mg total) by mouth daily. 90 tablet 3   norgestimate-ethinyl estradiol (ORTHO-CYCLEN) 0.25-35 MG-MCG tablet Take 1 tablet by mouth daily.     omeprazole (PRILOSEC) 40 MG capsule Take 1 capsule (40 mg total) by mouth daily. 90 capsule 1   ondansetron (ZOFRAN-ODT) 4 MG disintegrating tablet Take 1 tablet (4 mg total) by mouth every 8 (eight) hours as needed for nausea or vomiting. 20 tablet 0   spironolactone (ALDACTONE) 25 MG tablet Take 1 tablet (25 mg total) by mouth daily. 90 tablet 3   Vitamin D, Ergocalciferol, (DRISDOL) 1.25 MG (50000 UNIT) CAPS capsule Take 1 capsule (50,000 Units total) by mouth every 7 (seven) days. TAKE 1 CAPSULE (50,000 UNITS) BY MOUTH EVERY 7 DAYS Strength: 1.25 mg 12 capsule 0   levETIRAcetam (KEPPRA) 500 MG tablet Take 1 tablet (500 mg total) by mouth 2 (two) times daily. 60 tablet 3   melatonin 1 MG TABS tablet Take 1 mg by mouth at bedtime as needed (sleep).     No facility-administered medications prior to visit.    PAST MEDICAL HISTORY: Past Medical History:  Diagnosis Date   Alcohol abuse    hx of heavy alcohol abuse, quit 01/2022   Anemia 2023   s/p iron infusions, most recent as of 07/30/22 was on 05/16/22   Anxiety    Follows with PCP. Dr. Dewitt Rota, LOV 07/08/22. Currently taking Lorazepam and hydroxyzine   Cyclic vomiting syndrome    hospital admission on 05/28/22 for vomiting, follows with gastroenterologist Dr. 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 Tiajuana Amass, LOV  02/21/22 in Epic ( as of 07/31/22).   Hyperlipidemia    07/16/2022 lipid panel in Epic   Menorrhagia    Seizure (HCC) 06/06/2022   no prior hx of seizures, quit alcohol in 01/2022,  follows with neurologist, Dr. 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 See 06/06/22 Head CT and MRI in Epic.   Tremors of nervous system    whole body tremors, follows with Dr. Leim Fabry @ [facility], LOV 07/11/22 in Epic.   Vitamin deficiency 2023   B12 deficiency, receiving monthly B12 injections    PAST SURGICAL HISTORY: Past Surgical History:  Procedure Laterality Date   LAPAROSCOPIC APPENDECTOMY N/A 03/02/2022   Procedure: APPENDECTOMY LAPAROSCOPIC;  Surgeon: Manus Rudd, MD;  Location: [facility];  Service: General;  Laterality: N/A;  FAMILY HISTORY: Family History  Problem Relation Age of Onset   Breast cancer Mother    Cervical cancer Mother        ovarian   Bone cancer Mother    Breast cancer Maternal Grandmother    Colon cancer Maternal Great-grandmother 56   Esophageal cancer Neg Hx    Stomach cancer Neg Hx    Seizures Neg Hx    Stroke Neg Hx     SOCIAL HISTORY: Social History   Socioeconomic History   Marital status: Married    Spouse name: Not on file   Number of children: 3   Years of education: Not on file   Highest education level: Associate degree: occupational, Scientist, product/process development, or vocational program  Occupational History   Occupation: Emergency planning/management officer  Tobacco Use   Smoking status: Never   Smokeless tobacco: Never  Vaping Use   Vaping Use: Never used  Substance and Sexual Activity   Alcohol use: Not Currently    Alcohol/week: 21.0 standard drinks of alcohol    Types: 21 Glasses of wine per week    Comment: cut back and then started drinking a littl emore, currently 2 seltzers/day; h/o heavy use with 1/2 fifth daily   Drug use: Never   Sexual activity: Yes  Other Topics Concern   Not on file  Social History Narrative   Project mgr   Social Determinants of Health   Financial Resource Strain: Low Risk  (11/12/2022)    Overall Financial Resource Strain (CARDIA)    Difficulty of Paying Living Expenses: Not very hard  Food Insecurity: Food Insecurity Present (11/12/2022)   Hunger Vital Sign    Worried About Running Out of Food in the Last Year: Sometimes true    Ran Out of Food in the Last Year: Sometimes true  Transportation Needs: No Transportation Needs (11/12/2022)   PRAPARE - Administrator, Civil Service (Medical): No    Lack of Transportation (Non-Medical): No  Physical Activity: Unknown (11/12/2022)   Exercise Vital Sign    Days of Exercise per Week: 0 days    Minutes of Exercise per Session: Not on file  Stress: Stress Concern Present (11/12/2022)   [employer] of Occupational Health - Occupational Stress Questionnaire    Feeling of Stress : Rather much  Social Connections: Socially Isolated (11/12/2022)   Social Connection and Isolation Panel [NHANES]    Frequency of Communication with Friends and Family: Once a week    Frequency of Social Gatherings with Friends and Family: Once a week    Attends Religious Services: Never    Database administrator or Organizations: No    Attends Engineer, structural: Not on file    Marital Status: Married  Catering manager Violence: Not At Risk (06/07/2022)   Humiliation, Afraid, Rape, and Kick questionnaire    Fear of Current or Ex-Partner: No    Emotionally Abused: No    Physically Abused: No    Sexually Abused: No    PHYSICAL EXAM  GENERAL EXAM/CONSTITUTIONAL: Vitals:  Vitals:   01/09/23 1114  BP: 128/80  Pulse: 80  Weight: 229 lb (103.9 kg)  Height: 5\' 7"  (1.702 m)   Body mass index is 35.87 kg/m. Wt Readings from Last 3 Encounters:  01/09/23 229 lb (103.9 kg)  12/25/22 227 lb 1.2 oz (103 kg)  12/23/22 227 lb 8 oz (103.2 kg)   Patient is in no distress; well developed, nourished and groomed; neck is supple. She appears anxious and  tremolous  MUSCULOSKELETAL: Gait, strength, tone, movements noted in Neurologic  exam below  NEUROLOGIC: MENTAL STATUS:      No data to display         awake, alert, oriented to person, place and time recent and remote memory intact normal attention and concentration language fluent, comprehension intact, naming intact fund of knowledge appropriate  CRANIAL NERVE:  2nd, 3rd, 4th, 6th - Visual fields full to confrontation, extraocular muscles intact, no nystagmus 5th - facial sensation symmetric 7th - facial strength symmetric 8th - hearing intact 9th - palate elevates symmetrically, uvula midline 11th - shoulder shrug symmetric 12th - tongue protrusion midline  MOTOR:  normal bulk and tone, full strength in the BUE, BLE. Mild tremors noted on exam   SENSORY:  normal and symmetric to light touch  COORDINATION:  finger-nose-finger, fine finger movements normal  REFLEXES:  deep tendon reflexes present and symmetric  GAIT/STATION:  normal    DIAGNOSTIC DATA (LABS, IMAGING, TESTING) - I reviewed patient records, labs, notes, testing and imaging myself where available.  Lab Results  Component Value Date   WBC 6.1 12/20/2022   HGB 11.4 (L) 12/20/2022   HCT 34.7 (L) 12/20/2022   MCV 97.1 12/20/2022   PLT 279.0 12/20/2022      Component Value Date/Time   NA 138 01/07/2023 0819   NA 137 07/16/2022 0939   K 4.3 01/07/2023 0819   CL 103 01/07/2023 0819   CO2 25 01/07/2023 0819   GLUCOSE 77 01/07/2023 0819   BUN 15 01/07/2023 0819   BUN 12 07/16/2022 0939   CREATININE 0.69 01/07/2023 0819   CREATININE 0.51 02/11/2022 1103   CALCIUM 9.5 01/07/2023 0819   PROT 6.7 01/07/2023 0819   PROT 6.3 11/20/2022 1152   ALBUMIN 4.1 01/07/2023 0819   ALBUMIN 4.3 11/20/2022 1152   AST 31 01/07/2023 0819   AST 66 (H) 02/11/2022 1103   ALT 15 01/07/2023 0819   ALT 30 02/11/2022 1103   ALKPHOS 35 (L) 01/07/2023 0819   BILITOT 0.6 01/07/2023 0819   BILITOT 0.7 11/20/2022 1152   BILITOT 1.4 (H) 02/11/2022 1103   GFRNONAA >60 06/09/2022 0246   GFRNONAA  >60 02/11/2022 1103   Lab Results  Component Value Date   CHOL 229 (H) 11/20/2022   HDL 47 11/20/2022   LDLCALC 153 (H) 11/20/2022   TRIG 159 (H) 11/20/2022   Lab Results  Component Value Date   HGBA1C <4.2 10/15/2022   Lab Results  Component Value Date   VITAMINB12 >1500 (H) 10/15/2022   Lab Results  Component Value Date   TSH 4.27 10/15/2022   Head CT 06/06/22 1. 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 No acute intracranial hemorrhage  Routine EEG 06/06/22 - Excessive beta, generalized IMPRESSION: This technically difficult study is within normal limits. The excessive beta activity seen in the background is most likely due to the effect of benzodiazepine and is a benign EEG pattern. No seizures or epileptiform discharges were seen throughout the recording. A normal interictal EEG does not exclude the diagnosis of epilepsy   I personally reviewed brain Images and previous EEG reports.   ASSESSMENT AND PLAN  42 y.o. year old female  with with history of alcohol abuse and fatty liver who is presenting for follow-up for her seizures.  She did have a generalized tonic-clonic seizure on March 5 and since then we restarted Keppra 500 mg  twice daily but she continued to have episodes where she freezes, unable to move and episodes where she had jerk like movement.  She reported on 1 occasion taking extra dose of Keppra 250 with improvement of the episode.  At this time plan will be to check Keppra level but we will increase the Keppra to 750 mg twice daily.  I did advise patient to contact me if she does have a seizure otherwise I will see her in 6 months for follow-up.  We will also obtain a routine EEG for background classification.    1. Seizures (HCC)      Patient Instructions  Increase Keppra to 750 mg twice daily Will check Keppra level today Routine  EEG Follow-up in 6 months or sooner if worse   Per [facility] statutes, patients with seizures are not allowed to drive until they have been seizure-free for six months.  Other recommendations include using caution when using heavy equipment or power tools. Avoid working on ladders or at heights. Take showers instead of baths.  Do not swim alone.  Ensure the water temperature is not too high on the home water heater. Do not go swimming alone. Do not lock yourself in a room alone (i.e. bathroom). When caring for infants or small children, sit down when holding, feeding, or changing them to minimize risk of injury to the child in the event you have a seizure. Maintain good sleep hygiene. Avoid alcohol.  Also recommend adequate sleep, hydration, good diet and minimize stress.   During the Seizure  - First, ensure adequate ventilation and place patients on the floor on their left side  Loosen clothing around the neck and ensure the airway is patent. If the patient is clenching the teeth, do not force the mouth open with any object as this can cause severe damage - Remove all items from the surrounding that can be hazardous. The patient may be oblivious to what's happening and may not even know what he or she is doing. If the patient is confused and wandering, either gently guide him/her away and block access to outside areas - Reassure the individual and be comforting - Call 911. In most cases, the seizure ends before EMS arrives. However, there are cases when seizures may last over 3 to 5 minutes. Or the individual may have developed breathing difficulties or severe injuries. If a pregnant patient or a person with diabetes develops a seizure, it is prudent to call an ambulance. - Finally, if the patient does not regain full consciousness, then call EMS. Most patients will remain confused for about 45 to 90 minutes after a seizure, so you must use judgment in calling for help. - Avoid restraints  but make sure the patient is in a bed with padded side rails - Place the individual in a lateral position with the neck slightly flexed; this will help the saliva drain from the mouth and prevent the tongue from falling backward - Remove all nearby furniture and other hazards from the area - Provide verbal assurance as the individual is regaining consciousness - Provide the patient with privacy if possible - Call for help and start treatment as ordered by the caregiver   After the Seizure (Postictal Stage)  After a seizure, most patients experience confusion, fatigue, muscle pain and/or a headache. Thus, one should permit the individual to sleep. For the next few days, reassurance is essential. Being calm and helping reorient the person is also of importance.  Most  seizures are painless and end spontaneously. Seizures are not harmful to others but can lead to complications such as stress on the lungs, brain and the heart. Individuals with prior lung problems may develop labored breathing and respiratory distress.     Orders Placed This Encounter  Procedures   Levetiracetam level   EEG adult    Meds ordered this encounter  Medications   levETIRAcetam (KEPPRA) 750 MG tablet    Sig: Take 1 tablet (750 mg total) by mouth 2 (two) times daily.    Dispense:  180 tablet    Refill:  3    Return in about 6 months (around 07/12/2023).   Windell Norfolk, MD 01/09/2023, 12:09 PM  [facility] [address] [place], [place] [address] [phone]

## 2023-01-10 LAB — LEVETIRACETAM LEVEL: Levetiracetam Lvl: 13.7 ug/mL (ref 10.0–40.0)

## 2023-01-12 ENCOUNTER — Other Ambulatory Visit: Payer: Self-pay | Admitting: Family Medicine

## 2023-01-13 ENCOUNTER — Telehealth: Payer: Self-pay

## 2023-01-13 NOTE — Telephone Encounter (Signed)
Spoke with pt. Pt was notified of monitor results. Pt will continue current medication and f/u as planned.  

## 2023-01-14 ENCOUNTER — Telehealth: Payer: Self-pay

## 2023-01-14 ENCOUNTER — Telehealth: Payer: Self-pay | Admitting: Family Medicine

## 2023-01-14 ENCOUNTER — Other Ambulatory Visit: Payer: Self-pay | Admitting: Family Medicine

## 2023-01-14 NOTE — Telephone Encounter (Signed)
Please see message below

## 2023-01-14 NOTE — Telephone Encounter (Signed)
FYI-  Chrissy with [employer] called stating that a reviewer will be calling in a couple of days for a peer to peer concerning patient.  Please advise.  Thank you.

## 2023-01-14 NOTE — Telephone Encounter (Signed)
Chrissy with [employer] is requesting a Peer to Peer with Dr. Ruthine Dose for this pt. Please call [phone].

## 2023-01-15 ENCOUNTER — Encounter: Payer: Self-pay | Admitting: Orthopedic Surgery

## 2023-01-15 MED ORDER — METHOCARBAMOL 500 MG PO TABS: 500.0000 mg | ORAL_TABLET | Freq: Three times a day (TID) | ORAL | 0 refills | Status: DC | PRN

## 2023-01-15 MED ORDER — OXYCODONE HCL 5 MG PO TABS: 5.0000 mg | ORAL_TABLET | ORAL | 0 refills | Status: DC | PRN

## 2023-01-17 ENCOUNTER — Telehealth: Payer: Self-pay | Admitting: Orthopedic Surgery

## 2023-01-17 ENCOUNTER — Telehealth: Payer: Self-pay | Admitting: Family Medicine

## 2023-01-17 DIAGNOSIS — G8929 Other chronic pain: Secondary | ICD-10-CM

## 2023-01-17 NOTE — Telephone Encounter (Signed)
Caller called in regards to patient's long term disability case with them for her job. States the reviewer of the case, Dr. Shelly Coss, would like a peer to peer with Dr. Ruthine Dose. To schedule a good time for peer to peer, Dr. Fayrene Fearing can be reached @ [phone].

## 2023-01-17 NOTE — Telephone Encounter (Signed)
I called and advised pt of Dr. Kathi Der message- she states that this is opposite of what he told her and she sees him next week and they will discuss it further then

## 2023-01-17 NOTE — Telephone Encounter (Signed)
Patient called in upset about not having 30 pills of Oxycodone like she got the first time she recently got a refill of 20 pills on 01/15/23 she wants a refill

## 2023-01-17 NOTE — Telephone Encounter (Signed)
[employer] called in regards of getting a P2P set up for pt's  long term disability review. They stated call Dr. Fayrene Fearing directly to schedule the P2P at [phone]

## 2023-01-20 ENCOUNTER — Telehealth: Payer: Self-pay | Admitting: Family Medicine

## 2023-01-20 ENCOUNTER — Other Ambulatory Visit: Payer: Self-pay | Admitting: Family Medicine

## 2023-01-20 ENCOUNTER — Encounter: Payer: Self-pay | Admitting: Family Medicine

## 2023-01-20 ENCOUNTER — Telehealth: Payer: Self-pay

## 2023-01-20 ENCOUNTER — Other Ambulatory Visit: Payer: Self-pay | Admitting: Orthopedic Surgery

## 2023-01-20 ENCOUNTER — Ambulatory Visit: Payer: Managed Care, Other (non HMO) | Admitting: Family Medicine

## 2023-01-20 MED ORDER — OXYCODONE HCL 5 MG PO TABS: 5.0000 mg | ORAL_TABLET | ORAL | 0 refills | Status: AC | PRN

## 2023-01-20 MED ORDER — METHOCARBAMOL 500 MG PO TABS: 500.0000 mg | ORAL_TABLET | Freq: Three times a day (TID) | ORAL | 0 refills | Status: DC | PRN

## 2023-01-20 MED ORDER — PROMETHAZINE HCL 25 MG PO TABS: 25.0000 mg | ORAL_TABLET | Freq: Three times a day (TID) | ORAL | 0 refills | Status: DC | PRN

## 2023-01-20 NOTE — Telephone Encounter (Signed)
Patient requests to be called re: Questions about RX's

## 2023-01-20 NOTE — Telephone Encounter (Signed)
Please see Rx request concerning medications.

## 2023-01-20 NOTE — Telephone Encounter (Signed)
Chrissy is calling from [employer]. She would like to schedule a peer-to-peer with Dr. Teresa Coombs for patient's disability. Please call reviewer Dr. Shelly Coss [phone] for the peer-to-peer.

## 2023-01-20 NOTE — Telephone Encounter (Signed)
Any day after 430 should work. Can you please provide with a copy of the disability form that I have signed so I can review prior to the call. Thanks

## 2023-01-22 ENCOUNTER — Ambulatory Visit: Payer: Managed Care, Other (non HMO) | Attending: Nurse Practitioner | Admitting: Nurse Practitioner

## 2023-01-22 NOTE — Progress Notes (Deleted)
Office Visit    Patient Name: Janice Arnold Date of Encounter: 01/22/2023  Primary Care Provider:  Jeani Sow, MD Primary Cardiologist:  Nicki Guadalajara, MD  Chief Complaint    42 year old female with a history of QT prolongation, prior alcohol abuse, hyperlipidemia, elevated LFTs, fatty liver, seizure, SDH, GERD, depression, and anxiety who presents for follow-up related to abnormal EKG (QT prolongation) and hyperlipidemia.   Past Medical History    Past Medical History:  Diagnosis Date   Alcohol abuse    hx of heavy alcohol abuse, quit 01/2022   Anemia 2023   s/p iron infusions, most recent as of 07/30/22 was on 05/16/22   Anxiety    Follows with PCP. Dr. Dewitt Rota, LOV 07/08/22. Currently taking Lorazepam and hydroxyzine   Cyclic vomiting syndrome    hospital admission on 05/28/22 for vomiting, follows with gastroenterologist Dr. 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 Tiajuana Amass, LOV  02/21/22 in Epic ( as of 07/31/22).   Hyperlipidemia    07/16/2022 lipid panel in Epic   Menorrhagia    Seizure (HCC) 06/06/2022   no prior hx of seizures, quit alcohol in 01/2022, follows with neurologist, Dr. 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 See 06/06/22 Head CT and MRI in Epic.   Tremors of nervous system    whole body tremors, follows with Dr. Leim Fabry @ [facility], LOV 07/11/22 in Epic.   Vitamin deficiency 2023   B12 deficiency, receiving monthly B12 injections   Past Surgical History:  Procedure Laterality Date   LAPAROSCOPIC APPENDECTOMY N/A 03/02/2022   Procedure: APPENDECTOMY LAPAROSCOPIC;  Surgeon: Manus Rudd, MD;  Location: [facility];  Service: General;  Laterality: N/A;    Allergies  No Known Allergies   Labs/Other Studies Reviewed    The following studies were reviewed today:  Cardiac Studies & Procedures       ECHOCARDIOGRAM  ECHOCARDIOGRAM COMPLETE 11/14/2022  Narrative ECHOCARDIOGRAM REPORT    Patient Name:   Janice Arnold Date of Exam: 11/14/2022 Medical Rec #:  [id number]       Height:       67.0 in Accession #:    [id number]      Weight:       216.2 lb Date of Birth:  1981-08-12       BSA:          2.091 m Patient Age:    41 years        BP:           120/82 mmHg Patient Gender: F               HR:           86 bpm. Exam Location:  [address]  Procedure: 2D Echo, 3D Echo, Cardiac Doppler and Color Doppler  Indications:    R94.31 Prolonged QT Syndrome  History:        Patient has no prior history of Echocardiogram examinations. Abnormal ECG, Arrythmias:Tachycardia, Signs/Symptoms:Chest Pain, Shortness of Breath and Dizziness/Lightheadedness; Risk Factors:Dyslipidemia. Palpitations, History of ETOH Abuse.  Sonographer:    Farrel Conners RDCS Referring Phys:  THOMAS A KELLY  IMPRESSIONS   1. Left ventricular ejection fraction, by estimation, is 60 to 65%. The left ventricle has normal function. The left ventricle has no regional wall motion abnormalities. There is mild concentric left ventricular hypertrophy. Left ventricular diastolic parameters were normal. 2. Right ventricular systolic function is normal. The right ventricular size is normal. Tricuspid regurgitation signal is inadequate for assessing PA pressure. 3. The mitral valve is normal in structure. Trivial mitral valve regurgitation. No evidence of mitral stenosis. 4. The aortic valve is tricuspid. Aortic valve regurgitation is not visualized. No aortic stenosis is present. 5. The inferior vena cava is normal in size with greater than 50% respiratory variability, suggesting right atrial pressure of 3 mmHg.  FINDINGS Left  Ventricle: Left ventricular ejection fraction, by estimation, is 60 to 65%. The left ventricle has normal function. The left ventricle has no regional wall motion abnormalities. The left ventricular internal cavity size was normal in size. There is mild concentric left ventricular hypertrophy. Left ventricular diastolic parameters were normal.  Right Ventricle: The right ventricular size is normal. No increase in right ventricular wall thickness. Right ventricular systolic function is normal. Tricuspid regurgitation signal is inadequate for assessing PA pressure.  Left Atrium: Left atrial size was normal in size.  Right Atrium: Right atrial size was normal in size.  Pericardium: There is no evidence of pericardial effusion.  Mitral Valve: The mitral valve is normal in structure. Trivial mitral valve regurgitation. No evidence of mitral valve stenosis.  Tricuspid Valve: The tricuspid valve is normal in structure. Tricuspid valve regurgitation is not demonstrated.  Aortic Valve: The aortic valve is tricuspid. Aortic valve regurgitation is not visualized. No aortic stenosis is present.  Pulmonic Valve: The pulmonic valve was normal in structure. Pulmonic valve regurgitation is not visualized.  Aorta: The aortic root is normal in size and structure.  Venous: The inferior vena cava is normal in size with greater than 50% respiratory variability, suggesting right atrial pressure of 3 mmHg.  IAS/Shunts: No atrial level shunt detected by color flow Doppler.   LEFT VENTRICLE PLAX 2D LVIDd:         4.50 cm   Diastology LVIDs:         2.60 cm   LV e' medial:    10.05 cm/s LV PW:         0.80 cm   LV E/e' medial:  8.8 LV IVS:        1.00 cm   LV e' lateral:   12.10 cm/s LVOT diam:     2.30 cm   LV E/e' lateral: 7.3 LV SV:         87 LV SV Index:   42 LVOT Area:     4.15 cm  3D Volume EF: 3D EF:        65 % LV EDV:       132 ml LV ESV:       46 ml LV SV:        86 ml  RIGHT  VENTRICLE RV Basal diam:  3.80 cm RV S prime:     12.00 cm/s TAPSE (M-mode): 2.1 cm  LEFT ATRIUM             Index        RIGHT ATRIUM           Index LA diam:        4.20 cm 2.01 cm/m   RA Pressure: 3.00 mmHg LA Vol (A2C):  46.2 ml 22.10 ml/m  RA Area:     11.20 cm LA Vol (A4C):   71.2 ml 34.06 ml/m  RA Volume:   25.00 ml  11.96 ml/m LA Biplane Vol: 60.4 ml 28.89 ml/m AORTIC VALVE LVOT Vmax:   111.00 cm/s LVOT Vmean:  76.000 cm/s LVOT VTI:    0.209 m  AORTA Ao Root diam: 2.60 cm Ao Asc diam:  2.90 cm  MITRAL VALVE               TRICUSPID VALVE MV Area (PHT): cm         Estimated RAP:  3.00 mmHg MV Decel Time: 188 msec MV E velocity: 88.55 cm/s  SHUNTS MV A velocity: 59.75 cm/s  Systemic VTI:  0.21 m MV E/A ratio:  1.48        Systemic Diam: 2.30 cm  Dalton McleanMD Electronically signed by Wilfred Lacy Signature Date/Time: 11/14/2022/5:08:02 PM    Final    MONITORS  LONG TERM MONITOR (3-14 DAYS) 01/10/2023  Narrative Patch Wear Time:  14 days and 0 hours (2024-04-16T22:05:51-0400 to 2024-05-09T18:20:20-0400)  Monitor 1 Patient had a min HR of 56 bpm, max HR of 142 bpm, and avg HR of 92 bpm. Predominant underlying rhythm was Sinus Rhythm. Isolated SVEs were rare (<1.0%), SVE Couplets were rare (<1.0%), and no SVE Triplets were present. Isolated VEs were rare (<1.0%), VE Couplets were rare (<1.0%), and no VE Triplets were present.  Monitor 2 Patient had a min HR of 61 bpm, max HR of 148 bpm, and avg HR of 87 bpm. Predominant underlying rhythm was Sinus Rhythm. Isolated SVEs were rare (<1.0%), and no SVE Couplets or SVE Triplets were present. Isolated VEs were rare (<1.0%), and no VE Couplets or VE Triplets were present. Inverted QRS complexes possibly due to inverted placement of device.  The predominant rhythm is sinus rhythm with average rate at 92 and 87 on subsequent monitors.  There were rare PACs and PVCs.  No high-grade ectopy was demonstrated.  There  were no episodes of atrial fibrillation or significant sinus pauses.          Recent Labs: 10/15/2022: TSH 4.27 12/20/2022: Hemoglobin 11.4; Magnesium 1.7; Platelets 279.0 01/07/2023: ALT 15; BUN 15; Creatinine, Ser 0.69; Potassium 4.3; Sodium 138  Recent Lipid Panel    Component Value Date/Time   CHOL 229 (H) 11/20/2022 1152   TRIG 159 (H) 11/20/2022 1152   HDL 47 11/20/2022 1152   CHOLHDL 4.9 (H) 11/20/2022 1152   CHOLHDL 3 10/06/2019 0909   VLDL 24.6 10/06/2019 0909   LDLCALC 153 (H) 11/20/2022 1152    History of Present Illness    42 year old female with the above past medical history including QT prolongation, prior alcohol abuse, hyperlipidemia, elevated LFTs, fatty liver, seizure, SDH, GERD, depression, and anxiety.   She has a history of prior alcohol abuse, quit cold [place] in June 2023.  She has a history of cyclical vomiting and takes as needed Zofran, Phenergan.  She was hospitalized in October 2023 for seizures.  Head CT showed right cerebral convexity chronic subdural hematoma, hygroma measuring 1.3 cm in maximal thickness with mild mass defect and 3 mm right to left midline shift.  There was no acute intracranial hemorrhage.  She was started on Keppra, however, this was later discontinued per neurology.  She saw her PCP who noted concern for QT prolongation.  Lexapro was discontinued.  She was referred to cardiology in the setting of QT prolongation. She was started on  metoprolol in the setting of sinus tachycardia.  She also reported symptoms concerning for OSA and was scheduled for split-night sleep study.  She was started on Zetia.  Echocardiogram in 11/2022 showed EF 60 to 65%, normal LV function, no RWMA, mild concentric LVH, normal RV systolic function, no significant valvular abnormalities.  She had a seizure and sustained a compression fracture at the T5 level.  Keppra was subsequently increased.  She was last seen in the office on 11/15/2022 and was stable overall from a  cardiac standpoint.  She developed increased potation's with associated chest tightness, shortness of breath.  14-day ZIO revealed predominantly sinus rhythm, PACs and PVCs, no significant arrhythmia.  She was seen in the ED on 12/25/2022 in setting of back pain.   She presents today for follow-up.  Since her last visit she has been   1. QT prolongation: Stable on today's EKG. Lexapro was changed to Zoloft.  Recent echo in 11/2022 was normal.   2. Palpitations: She has a history of sinus tachycardia.  She notes recent increase in palpitations.  She states she will have palpitations for hours at a time with associated chest tightness, mild shortness of breath.  Symptoms occur approximately 5 to 6 days a week. Will check 14-day ZIO.  Discussed ED precautions.  Continue metoprolol.   3. Hyperlipidemia/elevated LFTs: Recently started on Zetia.  Will repeat fasting lipid panel, LFTs.  Continue Zetia.   4. History of seizure: Recent seizure activity which resulted in a compression fracture at the T5 level.  Following with neurology.   5. History of snoring: Sleep study is pending.   6. Disposition: Follow-up in Home Medications    Current Outpatient Medications  Medication Sig Dispense Refill   aMILoride (MIDAMOR) 5 MG tablet Take 1 tablet (5 mg total) by mouth daily as needed. 90 tablet 0   DULoxetine (CYMBALTA) 60 MG capsule Take 1 capsule (60 mg total) by mouth daily. 30 capsule 1   ezetimibe (ZETIA) 10 MG tablet Take 1 tablet (10 mg total) by mouth daily. 90 tablet 3   hydrOXYzine (ATARAX) 10 MG tablet TAKE 1 TABLET BY MOUTH EVERY 6 HOURS AS NEEDED 360 tablet 0   KLOR-CON M20 20 MEQ tablet TAKE 2 TABLETS (40 MEQ TOTAL) BY MOUTH 3 (THREE) TIMES DAILY. 540 tablet 1   levETIRAcetam (KEPPRA) 750 MG tablet Take 1 tablet (750 mg total) by mouth 2 (two) times daily. 180 tablet 3   methocarbamol (ROBAXIN) 500 MG tablet Take 1 tablet (500 mg total) by mouth every 8 (eight) hours as needed (pain, muscle  spasms). 70 tablet 0   metoprolol succinate (TOPROL XL) 25 MG 24 hr tablet Take 1 tablet (25 mg total) by mouth daily. 90 tablet 3   norgestimate-ethinyl estradiol (ORTHO-CYCLEN) 0.25-35 MG-MCG tablet Take 1 tablet by mouth daily.     omeprazole (PRILOSEC) 40 MG capsule Take 1 capsule (40 mg total) by mouth daily. 90 capsule 1   ondansetron (ZOFRAN-ODT) 4 MG disintegrating tablet Take 1 tablet (4 mg total) by mouth every 8 (eight) hours as needed for nausea or vomiting. 20 tablet 0   oxyCODONE (ROXICODONE) 5 MG immediate release tablet Take 1-2 tablets (5-10 mg total) by mouth every 4 (four) hours as needed for up to 5 days for severe pain. 30 tablet 0   promethazine (PHENERGAN) 25 MG tablet Take 1 tablet (25 mg total) by mouth every 8 (eight) hours as needed for nausea or vomiting. 20 tablet 0   spironolactone (ALDACTONE)  25 MG tablet Take 1 tablet (25 mg total) by mouth daily. 90 tablet 3   Vitamin D, Ergocalciferol, (DRISDOL) 1.25 MG (50000 UNIT) CAPS capsule TAKE 1 CAPSULE BY MOUTH EVERY 7 (SEVEN) DAYS. 12 capsule 0   No current facility-administered medications for this visit.     Review of Systems    ***.  All other systems reviewed and are otherwise negative except as noted above.    Physical Exam    VS:  LMP 12/19/2022 (Exact Date)  , BMI There is no height or weight on file to calculate BMI.     GEN: Well nourished, well developed, in no acute distress. HEENT: normal. Neck: Supple, no JVD, carotid bruits, or masses. Cardiac: RRR, no murmurs, rubs, or gallops. No clubbing, cyanosis, edema.  Radials/DP/PT 2+ and equal bilaterally.  Respiratory:  Respirations regular and unlabored, clear to auscultation bilaterally. GI: Soft, nontender, nondistended, BS + x 4. MS: no deformity or atrophy. Skin: warm and dry, no rash. Neuro:  Strength and sensation are intact. Psych: Normal affect.  Accessory Clinical Findings    ECG personally reviewed by me today - *** - no acute changes.    Lab Results  Component Value Date   WBC 6.1 12/20/2022   HGB 11.4 (L) 12/20/2022   HCT 34.7 (L) 12/20/2022   MCV 97.1 12/20/2022   PLT 279.0 12/20/2022   Lab Results  Component Value Date   CREATININE 0.69 01/07/2023   BUN 15 01/07/2023   NA 138 01/07/2023   K 4.3 01/07/2023   CL 103 01/07/2023   CO2 25 01/07/2023   Lab Results  Component Value Date   ALT 15 01/07/2023   AST 31 01/07/2023   ALKPHOS 35 (L) 01/07/2023   BILITOT 0.6 01/07/2023   Lab Results  Component Value Date   CHOL 229 (H) 11/20/2022   HDL 47 11/20/2022   LDLCALC 153 (H) 11/20/2022   TRIG 159 (H) 11/20/2022   CHOLHDL 4.9 (H) 11/20/2022    Lab Results  Component Value Date   HGBA1C <4.2 10/15/2022    Assessment & Plan    1.  ***  No BP recorded.  {Refresh Note OR Click here to enter BP  :1}***   Joylene Grapes, NP 01/22/2023, 5:56 AM

## 2023-01-27 ENCOUNTER — Other Ambulatory Visit: Payer: Self-pay | Admitting: Family Medicine

## 2023-01-29 ENCOUNTER — Ambulatory Visit (INDEPENDENT_AMBULATORY_CARE_PROVIDER_SITE_OTHER): Payer: Managed Care, Other (non HMO) | Admitting: Orthopedic Surgery

## 2023-01-29 ENCOUNTER — Other Ambulatory Visit (INDEPENDENT_AMBULATORY_CARE_PROVIDER_SITE_OTHER): Payer: Managed Care, Other (non HMO)

## 2023-01-29 DIAGNOSIS — S22000A Wedge compression fracture of unspecified thoracic vertebra, initial encounter for closed fracture: Secondary | ICD-10-CM | POA: Diagnosis not present

## 2023-01-29 MED ORDER — OXYCODONE HCL 5 MG PO TABS: 5.0000 mg | ORAL_TABLET | ORAL | 0 refills | Status: AC | PRN

## 2023-01-29 NOTE — Progress Notes (Signed)
Orthopedic Spine Surgery Office Note   Assessment: Patient is a 42 y.o. female with T4, T5, T6 compression fractures Date of injury: 11/05/2022 (~3 months from injury)      Plan: -Patient has tried activity modification, tylenol, ibuprofen, oxycodone, robaxin -Prescribed oxycodone today -Okay to gradually return to activity as tolerated -Since her pain is not getting better and her fractures appear subacute on the MRI, provided her with a referral to pain management -Patient should return to office in 8 weeks, x-rays at next visit: AP/lateral thoracic     Patient expressed understanding of the plan and all questions were answered to the patient's satisfaction.    ___________________________________________________________________________   History: Patient is a 42 y.o. female who has been previously seen in the office for thoracic back pain.  Pain is still severe in her thoracic spine in the interscapular region.  It does not radiate into either upper or lower extremity.  Pain is to the point that she is unable to work.  She notices the pain with any activity.  The oxycodone and Robaxin have been helping.  There have been no recent changes in her symptoms since last time she was seen.   Previous treatments: activity modification, tylenol, ibuprofen, oxycodone, robaxin   Physical Exam:   General: no acute distress, appears stated age Neurologic: alert, answering questions appropriately, following commands Respiratory: unlabored breathing on room air, symmetric chest rise Psychiatric: appropriate affect, normal cadence to speech     MSK (spine):   -Strength exam                                                   Left                  Right EHL                              5/5                  5/5 TA                                 5/5                  5/5 GSC                             5/5                  5/5 Knee extension            5/5                  5/5 Hip flexion                     5/5                  5/5   -Sensory exam                           Sensation intact to light touch in L3-S1 nerve distributions of bilateral lower extremities   -Achilles DTR: 2/4 on the  left, 2/4 on the right -Patellar tendon DTR: 2/4 on the left, 2/4 on the right   -Straight leg raise: Negative bilaterally -Femoral nerve stretch test: Negative bilaterally -Clonus: no beats bilaterally   Imaging: XR of the thoracic spine from 01/29/2023 was previously independently reviewed and interpreted, showing anterior wedge deformity at T6.  No change in height loss.  Alignment maintained.  No other fracture seen.  No dislocation seen.  No significant degenerative changes seen.    CT of the thoracic spine from 12/12/2022 was previously independently reviewed and interpreted, showing wedge deformity with anterior height loss at T4, T5, T6.  Greatest height loss at T6 with irregularity to the endplates, particular the superior endplate.  Vacuum disc phenomenon seen in T6/7 disc space.    MRI of the thoracic spine from 01/08/2023 was independently reviewed and interpreted, showing some increased T2 and STIR signal within the T6 level.  No other significant increased signal intensity within the thoracic vertebra.  No new fracture seen.  No retropulsion or canal stenosis seen.    Patient name: Janice Arnold Patient MRN: [id number] Date of visit: 01/29/23

## 2023-01-30 ENCOUNTER — Other Ambulatory Visit: Payer: Managed Care, Other (non HMO) | Admitting: *Deleted

## 2023-01-30 ENCOUNTER — Encounter: Payer: Self-pay | Admitting: *Deleted

## 2023-02-06 ENCOUNTER — Other Ambulatory Visit: Payer: Self-pay

## 2023-02-06 ENCOUNTER — Encounter (HOSPITAL_BASED_OUTPATIENT_CLINIC_OR_DEPARTMENT_OTHER): Payer: Self-pay | Admitting: Obstetrics and Gynecology

## 2023-02-06 NOTE — Progress Notes (Signed)
Your procedure is scheduled on Tuesday, 03/04/23.  Report to [facility] AT  11:30 AM.   Call this number if you have problems the morning of surgery  [phone].   OUR ADDRESS IS [address].  WE ARE LOCATED IN [facility].  PLEASE BRING YOUR INSURANCE CARD AND PHOTO ID DAY OF SURGERY.  ONLY 2 PEOPLE ARE ALLOWED IN  WAITING  ROOM                                      REMEMBER:  DO NOT EAT FOOD, CANDY GUM OR MINTS  AFTER MIDNIGHT THE NIGHT BEFORE YOUR SURGERY . YOU MAY HAVE CLEAR LIQUIDS FROM MIDNIGHT THE NIGHT BEFORE YOUR SURGERY UNTIL  10:30 AM. NO CLEAR LIQUIDS AFTER   10:30 AM DAY OF SURGERY.  YOU MAY  BRUSH YOUR TEETH MORNING OF SURGERY AND RINSE YOUR MOUTH OUT, NO CHEWING GUM CANDY OR MINTS.     CLEAR LIQUID DIET    Allowed      Water                                                                   Coffee and tea, regular and decaf  (NO cream or milk products of any type, may sweeten)                         Carbonated beverages, regular and diet                                    Sports drinks like Gatorade _____________________________________________________________________     TAKE ONLY THESE MEDICATIONS MORNING OF SURGERY:  CYMBALTA, ZETIA, KLOR-CON (IF YOU CAN TAKE ON AN EMPTY STOMACH), KEPPRA, HYDROXYZINE IF NEEDED, ROBAXIN IF NEEDED, METOPROLOL, OMEPRAZOLE, NORETHINDRONE, BIRTH CONTROL PILL, PHENERGAN                                        DO NOT WEAR JEWERLY/  METAL/  PIERCINGS (INCLUDING NO PLASTIC PIERCINGS) DO NOT WEAR LOTIONS, POWDERS, PERFUMES OR NAIL POLISH ON YOUR FINGERNAILS. TOENAIL POLISH IS OK TO WEAR. DO NOT SHAVE FOR 48 HOURS PRIOR TO DAY OF SURGERY.  CONTACTS, GLASSES, OR DENTURES MAY NOT BE WORN TO SURGERY.  REMEMBER: NO SMOKING, VAPING ,  DRUGS OR ALCOHOL FOR 24 HOURS BEFORE YOUR SURGERY.                                    [facility] IS NOT RESPONSIBLE  FOR ANY BELONGINGS.                                                                     Marland Kitchen  [facility] - Preparing for Surgery Before surgery, you can play an important role.  Because skin is not sterile, your skin needs to be as free of germs as possible.  You can reduce the number of germs on your skin by washing with CHG (chlorahexidine gluconate) soap before surgery.  CHG is an antiseptic cleaner which kills germs and bonds with the skin to continue killing germs even after washing. Please DO NOT use if you have an allergy to CHG or antibacterial soaps.  If your skin becomes reddened/irritated stop using the CHG and inform your nurse when you arrive at Short Stay. Do not shave (including legs and underarms) for at least 48 hours prior to the first CHG shower.  You may shave your face/neck. Please follow these instructions carefully:  1.  Shower with CHG Soap the night before surgery and the  morning of Surgery.  2.  If you choose to wash your hair, wash your hair first as usual with your  normal  shampoo.  3.  After you shampoo, rinse your hair and body thoroughly to remove the  shampoo.                                        4.  Use CHG as you would any other liquid soap.  You can apply chg directly  to the skin and wash , chg soap provided, night before and morning of your surgery.  5.  Apply the CHG Soap to your body ONLY FROM THE NECK DOWN.   Do not use on face/ open                           Wound or open sores. Avoid contact with eyes, ears mouth and genitals (private parts).                       Wash face,  Genitals (private parts) with your normal soap.             6.  Wash thoroughly, paying special attention to the area where your surgery  will be performed.  7.  Thoroughly rinse your body with warm water from the neck down.  8.  DO NOT shower/wash with your normal soap after using and rinsing off  the CHG Soap.             9.  Pat yourself dry with a clean towel.            10.  Wear clean pajamas.             11.  Place clean sheets on your bed the night of your first shower and do not  sleep with pets. Day of Surgery : Do not apply any lotions/ powders the morning of surgery.  Please wear clean clothes to the hospital/surgery center.  IF YOU HAVE ANY SKIN IRRITATION OR PROBLEMS WITH THE SURGICAL SOAP, PLEASE GET A BAR OF GOLD DIAL SOAP AND SHOWER THE NIGHT BEFORE YOUR SURGERY AND THE MORNING OF YOUR SURGERY. PLEASE LET THE NURSE KNOW MORNING OF YOUR SURGERY IF YOU HAD ANY PROBLEMS WITH THE SURGICAL SOAP.   YOUR SURGEON MAY HAVE REQUESTED EXTENDED RECOVERY TIME AFTER YOUR SURGERY. IT COULD BE A  JUST A FEW HOURS  UP TO AN OVERNIGHT STAY.  YOUR SURGEON SHOULD HAVE DISCUSSED  THIS WITH YOU PRIOR TO YOUR SURGERY. IN THE EVENT YOU NEED TO STAY OVERNIGHT PLEASE REFER TO THE FOLLOWING GUIDELINES. YOU MAY HAVE UP TO 4 VISITORS  MAY VISIT IN THE EXTENDED RECOVERY ROOM UNTIL 800 PM ONLY.  ONE  VISITOR AGE 23 AND OVER MAY SPEND THE NIGHT AND MUST BE IN EXTENDED RECOVERY ROOM NO LATER THAN 800 PM . YOUR DISCHARGE TIME AFTER YOU SPEND THE NIGHT IS 900 AM THE MORNING AFTER YOUR SURGERY. YOU MAY PACK A SMALL OVERNIGHT BAG WITH TOILETRIES FOR YOUR OVERNIGHT STAY IF YOU WISH.  REGARDLESS OF IF YOU STAY OVER NIGHT OR ARE DISCHARGED THE SAME DAY YOU WILL BE REQUIRED TO HAVE A RESPONSIBLE ADULT (18 YRS OLD OR OLDER) STAY WITH YOU FOR AT LEAST THE FIRST 24 HOURS  YOUR PRESCRIPTION MEDICATIONS WILL BE PROVIDED DURING YOUR HOSPITAL STAY.  ________________________________________________________________________                                                        QUESTIONS Mechele Claude PRE OP NURSE PHONE [phone].

## 2023-02-06 NOTE — Progress Notes (Addendum)
Spoke w/ via phone for pre-op interview---Janice Arnold needs dos----urine pregnancy per anesthesia, surgeon orders pending               Arnold results------02/24/23 Arnold appt for cbc, bmp, type & screen, 11/15/22 EKG in chart & Epic, 11/14/22 Echo in Epic, EF 60 -65% COVID test -----patient states asymptomatic no test needed Arrive at -------1130 on Tuesday, 03/04/23 NPO after MN NO Solid Food.  Clear liquids from MN until---1030 Med rec completed Medications to take morning of surgery -----Cymbalta, Zetia, Klor-con, Keppra, Hydroxyzine prn, Robaxin prn, Metoprolol, Omeprazole, Promethazine, Oxycodone prn, norethindrone, BCP Diabetic medication -----n/a Patient instructed no nail polish to be worn day of surgery Patient instructed to bring photo id and insurance card day of surgery  Patient aware to have Driver (ride ) / caregiver    for 24 hours after surgery - husband, [facility] Patient Special Instructions -----Extended / overnight stay instructions given. Pre-Op special Instructions -----Requested orders from Dr. Billy Coast on 02/06/23 via Epic IB. Patient verbalized understanding of instructions that were given at this phone interview. Patient denies shortness of breath, chest pain, fever, cough at this phone interview.   Patient has surgical clearance from hematology, neurology, and orthopedics in chart.

## 2023-02-07 ENCOUNTER — Encounter (HOSPITAL_BASED_OUTPATIENT_CLINIC_OR_DEPARTMENT_OTHER): Payer: Self-pay | Admitting: Obstetrics and Gynecology

## 2023-02-11 ENCOUNTER — Other Ambulatory Visit: Payer: Self-pay | Admitting: Family Medicine

## 2023-02-11 ENCOUNTER — Other Ambulatory Visit: Payer: Self-pay | Admitting: Orthopedic Surgery

## 2023-02-12 ENCOUNTER — Encounter: Payer: Self-pay | Admitting: Family Medicine

## 2023-02-12 ENCOUNTER — Telehealth: Payer: Self-pay | Admitting: Orthopedic Surgery

## 2023-02-12 ENCOUNTER — Ambulatory Visit (INDEPENDENT_AMBULATORY_CARE_PROVIDER_SITE_OTHER): Payer: Managed Care, Other (non HMO) | Admitting: Family Medicine

## 2023-02-12 ENCOUNTER — Other Ambulatory Visit: Payer: Self-pay | Admitting: Obstetrics and Gynecology

## 2023-02-12 VITALS — BP 112/70 | HR 100 | Temp 98.1°F | Resp 18 | Ht 67.0 in | Wt 231.0 lb

## 2023-02-12 DIAGNOSIS — S22000A Wedge compression fracture of unspecified thoracic vertebra, initial encounter for closed fracture: Secondary | ICD-10-CM

## 2023-02-12 DIAGNOSIS — E876 Hypokalemia: Secondary | ICD-10-CM

## 2023-02-12 DIAGNOSIS — R600 Localized edema: Secondary | ICD-10-CM | POA: Diagnosis not present

## 2023-02-12 DIAGNOSIS — Z6836 Body mass index (BMI) 36.0-36.9, adult: Secondary | ICD-10-CM

## 2023-02-12 DIAGNOSIS — E6609 Other obesity due to excess calories: Secondary | ICD-10-CM

## 2023-02-12 DIAGNOSIS — F4323 Adjustment disorder with mixed anxiety and depressed mood: Secondary | ICD-10-CM

## 2023-02-12 DIAGNOSIS — R21 Rash and other nonspecific skin eruption: Secondary | ICD-10-CM

## 2023-02-12 DIAGNOSIS — R112 Nausea with vomiting, unspecified: Secondary | ICD-10-CM

## 2023-02-12 DIAGNOSIS — E538 Deficiency of other specified B group vitamins: Secondary | ICD-10-CM

## 2023-02-12 MED ORDER — CYANOCOBALAMIN 1000 MCG/ML IJ SOLN
1000.0000 ug | Freq: Once | INTRAMUSCULAR | Status: AC
Start: 2023-02-12 — End: 2023-02-12
  Administered 2023-02-12: 1000 ug via INTRAMUSCULAR

## 2023-02-12 MED ORDER — OXYCODONE HCL 5 MG PO TABS: 5.0000 mg | ORAL_TABLET | ORAL | 0 refills | Status: AC | PRN

## 2023-02-12 MED ORDER — TRIAMCINOLONE ACETONIDE 0.1 % EX CREA: 1.0000 | TOPICAL_CREAM | Freq: Two times a day (BID) | CUTANEOUS | 0 refills | Status: DC

## 2023-02-12 MED ORDER — ONDANSETRON 4 MG PO TBDP: 4.0000 mg | ORAL_TABLET | Freq: Three times a day (TID) | ORAL | 1 refills | Status: DC | PRN

## 2023-02-12 MED ORDER — DULOXETINE HCL 60 MG PO CPEP: 60.0000 mg | ORAL_CAPSULE | Freq: Every day | ORAL | 1 refills | Status: DC

## 2023-02-12 MED ORDER — PROMETHAZINE HCL 25 MG PO TABS: 25.0000 mg | ORAL_TABLET | Freq: Every day | ORAL | 0 refills | Status: DC

## 2023-02-12 NOTE — Patient Instructions (Signed)
It was very nice to see you today!  Eat better.  Triamcinolone cream Hydroxyzine 3x/day  Stop the muscle reloxor.  May need to stop the oxy.     PLEASE NOTE:  If you had any lab tests please let us know if you have not heard back within a few days. You may see your results on MyChart before we have a chance to review them but we will give you a call once they are reviewed by [place]. If we ordered any referrals today, please let us know if you have not heard from their office within the next week.   Please try these tips to maintain a healthy lifestyle:  Eat most of your calories during the day when you are active. Eliminate processed foods including packaged sweets (pies, cakes, cookies), reduce intake of potatoes, white bread, white pasta, and white rice. Look for whole grain options, oat flour or almond flour.  Each meal should contain half fruits/vegetables, one quarter protein, and one quarter carbs (no bigger than a computer mouse).  Cut down on sweet beverages. This includes juice, soda, and sweet tea. Also watch fruit intake, though this is a healthier sweet option, it still contains natural sugar! Limit to 3 servings daily.  Drink at least 1 glass of water with each meal and aim for at least 8 glasses per day  Exercise at least 150 minutes every week.

## 2023-02-12 NOTE — Progress Notes (Signed)
Subjective:     Patient ID: Janice Arnold, female    DOB: November 30, 1980, 42 y.o.   MRN: [id number]  Chief Complaint  Patient presents with   Follow-up    4 week follow-up Gets really itchy when very hot, air is out at home, redness all over Discuss medications    HPI  Medications - She has been taking Amiloride 5 mg, Hydroxyzine 10 mg twice daily,takng hydrochlorothiazide again(on her own), Metoprolol XL 25, Aygestin 5 mg, Omeprazole 40 mg, Zofran 4 mg as needed, Promethazine 25 mg daily, Spironolactone 25 mg, and Oxycodone 5 mg. Has stopped taking her Zetia.  Potassium - She has been taking both Amiloride and HCTZ at the same time as still had a lot of edema. Her HCTZ has been helping. She is also taking Potassium-chloride 2 tablets BID.  Back pain - Takes Tylenol and Advil to manage back pain-still seeing ortho and called for refill of oxy.  Vomiting/Nausea - She states her vomiting has improved overall. She reports she last vomited after eating a few days ago. During these episodes she takes her  Zofran, o/w taking promethazine daily. Rash - She reports a rash and itching all throughout her body (chest, back, arms, legs). She believes it could be associated to her [facility] not working at home and heat rash. Has tried using Cortizone-10 topical cream.(Seems to have started w/oxycodone and robaxin)  Depression/Anxiety - She reports her mood has improved on Duloxetine 60, better than when she was taking Sertraline. Has not taken her Duloxetine in 2-3 days due to not being able to get a refill at her pharmacy. Denies any SI.  Weight management - States her goal weight is 160s. Has not been eating enough calories a day, not eating nutritious diet(1 bag of popcorn is all she eats all day!!!). Drinks plenty water. No snacking/other foods.  Requests phentermine again.  B12 - She is receiving a B12 injection today.  Hysterectomy - scheduled for 03/04/2023.    Health Maintenance Due  Topic Date Due    MAMMOGRAM  04/29/2020    Past Medical History:  Diagnosis Date   Alcohol abuse    history of heavy alcohol abuse, last drank alcohol in 06/2022   Anemia 2023   s/p iron infusions   Anxiety    Follows with PCP. Dr. Dewitt Rota.   Compression fracture of body of thoracic vertebra (HCC) 11/05/2022   T4, T5, T6 / Follows w/ orthopedic, Dr. Willia Craze. Fractures occurred while patient was having a seizure, per patient.   Cyclic vomiting syndrome    hospital admission on 05/28/22 for vomiting, follows with gastroenterologist Dr. Tiajuana Amass.   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 Tiajuana Amass.   GERD (gastroesophageal reflux disease)    takes omeprazole daily   Heart murmur    since childhood   Hyperlipidemia    11/20/22 lipid panel in Epic   Hypokalemia 10/15/2022   K+ level 2.5   Menorrhagia    Palpitations    Follows w/ cardiology, Anice Paganini, NP. 11/14/2022 echocardiogram EF 60 65%, 12/31/22 Event heart monitor results in Epic.   Prolonged QT interval    Follows w/ cardiology, Bernadene Person, NP.   Seizure (HCC) 06/06/2022   Follows with neurologist, Dr. Leim Fabry @ [facility]. Seizures were thought to be related to stopping alcohol. However,  patient has had seizures months after quitting alcohol. As of 02/06/23, last seizure was on 11/07/22. Keppra was then increased to bid.   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 See 11/05/22 Head CT and MRI in Epic. Resolved per pt on 02/06/23.   Tremors of nervous system    whole body tremors, follows with Dr. Leim Fabry @ [facility].   Vitamin deficiency 2023   B12 deficiency, receiving monthly B12 injections / Vitamin D deficiency, taking supplement every two weeks   Wears contact lenses    Wears glasses      Past Surgical History:  Procedure Laterality Date   COLONOSCOPY WITH ESOPHAGOGASTRODUODENOSCOPY (EGD)  03/27/2022   one rectal polyp   LAPAROSCOPIC APPENDECTOMY N/A 03/02/2022   Procedure: APPENDECTOMY LAPAROSCOPIC;  Surgeon: Manus Rudd, MD;  Location: [facility];  Service: General;  Laterality: N/A;     Current Outpatient Medications:    acetaminophen (TYLENOL) 500 MG tablet, Take 500 mg by mouth every 6 (six) hours as needed., Disp: , Rfl:    ezetimibe (ZETIA) 10 MG tablet, Take 1 tablet (10 mg total) by mouth daily., Disp: 90 tablet, Rfl: 3   hydrochlorothiazide (HYDRODIURIL) 25 MG tablet, Take 25 mg by mouth daily as needed., Disp: , Rfl:    hydrOXYzine (ATARAX) 10 MG tablet, TAKE 1 TABLET BY MOUTH EVERY 6 HOURS AS NEEDED, Disp: 360 tablet, Rfl: 0   ibuprofen (ADVIL) 100 MG tablet, Take 200 mg by mouth every 6 (six) hours as needed for fever., Disp: , Rfl:    KLOR-CON M20 20 MEQ tablet, TAKE 2 TABLETS (40 MEQ TOTAL) BY MOUTH 3 (THREE) TIMES DAILY., Disp: 540 tablet, Rfl: 1   levETIRAcetam (KEPPRA) 750 MG tablet, Take 750 mg by mouth 2 (two) times daily. 1 tablet twice a day, Disp: , Rfl:    methocarbamol (ROBAXIN) 500 MG tablet, TAKE 1 TABLET BY MOUTH UP TO EVERY 8 HOURS AS NEEDED FOR PAIN/MUSCLE SPASMS, Disp: 70 tablet, Rfl: 0   metoprolol succinate (TOPROL XL) 25 MG 24 hr tablet, Take 1 tablet (25 mg total) by mouth daily., Disp: 90 tablet, Rfl: 3   norethindrone (AYGESTIN) 5 MG tablet, Take by mouth daily., Disp: , Rfl:    norgestimate-ethinyl estradiol (ORTHO-CYCLEN) 0.25-35 MG-MCG tablet, Take 1 tablet by mouth daily., Disp: , Rfl:    omeprazole (PRILOSEC) 40 MG capsule, Take 1 capsule (40 mg total) by mouth daily., Disp: 90 capsule, Rfl: 1   spironolactone (ALDACTONE) 25 MG tablet, Take 1 tablet (25 mg total) by mouth daily., Disp: 90 tablet, Rfl: 3   triamcinolone cream (KENALOG) 0.1 %, Apply 1 Application topically 2 (two) times daily., Disp: 30 g, Rfl: 0   Vitamin D,  Ergocalciferol, (DRISDOL) 1.25 MG (50000 UNIT) CAPS capsule, TAKE 1 CAPSULE BY MOUTH EVERY 7 (SEVEN) DAYS. (Patient taking differently: Takes every 2 weeks.), Disp: 12 capsule, Rfl: 0   DULoxetine (CYMBALTA) 60 MG capsule, Take 1 capsule (60 mg total) by mouth daily., Disp: 90 capsule, Rfl: 1   ondansetron (ZOFRAN-ODT) 4 MG disintegrating tablet, Take 1 tablet (4 mg total) by mouth every 8 (eight) hours as needed for nausea or vomiting., Disp: 20 tablet, Rfl: 1   oxyCODONE (ROXICODONE) 5 MG immediate release tablet, Take 1 tablet (5 mg total) by mouth every 4 (four) hours as needed for up to 5 days for severe pain., Disp: 30 tablet, Rfl: 0   promethazine (PHENERGAN) 25 MG tablet,  Take 1 tablet (25 mg total) by mouth daily., Disp: 90 tablet, Rfl: 0  No Known Allergies ROS neg/noncontributory except as noted HPI/below      Objective:     BP 112/70   Pulse 100   Temp 98.1 F (36.7 C) (Temporal)   Resp 18   Ht 5\' 7"  (1.702 m)   Wt 231 lb (104.8 kg)   LMP 01/29/2023 (Approximate)   SpO2 98%   BMI 36.18 kg/m  Wt Readings from Last 3 Encounters:  02/12/23 231 lb (104.8 kg)  01/09/23 229 lb (103.9 kg)  12/25/22 227 lb 1.2 oz (103 kg)    Physical Exam   Gen: WDWN NAD HEENT: NCAT, conjunctiva not injected, sclera nonicteric NECK:  supple, no thyromegaly, no nodes, no carotid bruits CARDIAC: Tachycardia, S1S2+, no murmur. DP 2+B LUNGS: CTAB. No wheezes ABDOMEN:  BS+, soft, NTND, No HSM, no masses EXT:  no edema MSK: no gross abnormalities.  NEURO: A&O x3.  CN II-XII intact.  PSYCH: normal mood. Good eye contact Skin: Fine, red MP rash, antecubital fossa lower legs, chest, back.   Discussed genetic profile     Assessment & Plan:  B12 deficiency Assessment & Plan: Chronic.  On monthly inections-given today.  Prob d/t malabsorption and poor nutrition  Orders: -     Cyanocobalamin  Local edema Assessment & Plan: Chronic.  Pt was placed on ameloride d/t low K, but edema not  controlled so pt restarted hydrochlorothiazide 25mg  daily.  On spironolactone as well.  Continue.  Monitor K closely   Adjustment disorder with mixed anxiety and depressed mood Assessment & Plan: Chronic.  Controlled.  Did genetic panel, duloxetine good.  Continue 60mg  daily.    Hypokalemia Assessment & Plan: Chronic.  At first, d/t vomiting but that is better.  Then thought d/t hydrochlorothiazide.  Still taking bid.  Pt w/a lot of other things as well-obesity despite not eating much.  Cortisol was normal  refer endo.    Intractable nausea and vomiting Assessment & Plan: Chronic.  Improved, but still taking promethazine daily and prn zofran.  Seeing GI soon   Rash  Class 2 obesity due to excess calories with body mass index (BMI) of 36.0 to 36.9 in adult, unspecified whether serious comorbidity present  Compression fracture of body of thoracic vertebra (HCC)  Other orders -     DULoxetine HCl; Take 1 capsule (60 mg total) by mouth daily.  Dispense: 90 capsule; Refill: 1 -     Triamcinolone Acetonide; Apply 1 Application topically 2 (two) times daily.  Dispense: 30 g; Refill: 0 -     Ondansetron; Take 1 tablet (4 mg total) by mouth every 8 (eight) hours as needed for nausea or vomiting.  Dispense: 20 tablet; Refill: 1 -     Promethazine HCl; Take 1 tablet (25 mg total) by mouth daily.  Dispense: 90 tablet; Refill: 0  Compression fx T spine 4-6.  Care per ortho. Rash-could be eczema-will add triamcinolone 0.1% but could be pain meds as well.  Advised to hold 2-3 days and see if improves.   Obesity-pt not eating enough and very poor nutrition-advised needs better nutrition and exercise(limited by compression fx), rather than phentermine(also w/tachycardia so not comfortable rx anyway).    Getting labs for pre-op next week.   Return in about 2 months (around 04/15/2023) for chronic follow-up.   I,Rachel Rivera,acting as a scribe for Angelena Sole, MD.,have documented all  relevant documentation on the behalf of Dewayne Hatch  Rockney Ghee, MD,as directed by  Angelena Sole, MD while in the presence of Angelena Sole, MD.  I, Angelena Sole, MD, have reviewed all documentation for this visit. The documentation on 02/13/23 for the exam, diagnosis, procedures, and orders are all accurate and complete.   Angelena Sole, MD

## 2023-02-12 NOTE — Telephone Encounter (Signed)
I called and lmom advised her Dr. Buel Ream message

## 2023-02-12 NOTE — Telephone Encounter (Signed)
Patient called. She would like a refill on oxycodone. Cb# [phone]

## 2023-02-13 DIAGNOSIS — E538 Deficiency of other specified B group vitamins: Secondary | ICD-10-CM | POA: Insufficient documentation

## 2023-02-13 NOTE — Assessment & Plan Note (Signed)
Chronic.  On monthly inections-given today.  Prob d/t malabsorption and poor nutrition

## 2023-02-13 NOTE — Assessment & Plan Note (Signed)
Chronic.  Pt was placed on ameloride d/t low K, but edema not controlled so pt restarted hydrochlorothiazide 25mg  daily.  On spironolactone as well.  Continue.  Monitor K closely

## 2023-02-13 NOTE — Assessment & Plan Note (Signed)
Chronic.  At first, d/t vomiting but that is better.  Then thought d/t hydrochlorothiazide.  Still taking bid.  Pt w/a lot of other things as well-obesity despite not eating much.  Cortisol was normal  refer endo.

## 2023-02-13 NOTE — Assessment & Plan Note (Signed)
Chronic.  Improved, but still taking promethazine daily and prn zofran.  Seeing GI soon

## 2023-02-13 NOTE — Assessment & Plan Note (Signed)
Chronic.  Controlled.  Did genetic panel, duloxetine good.  Continue 60mg  daily.

## 2023-02-18 ENCOUNTER — Telehealth: Payer: Self-pay

## 2023-02-18 NOTE — Telephone Encounter (Signed)
Dr. Marjory Lies calling with orthopedic consultant/[employer]; calling to help review records with Dr. Christell Constant on when patient can return to work His# [phone]

## 2023-02-19 NOTE — Telephone Encounter (Signed)
Lvm advising  

## 2023-02-19 NOTE — Telephone Encounter (Signed)
Dr. Ancil Linsey called back and Dr. Christell Constant talked to him more about this pt.

## 2023-02-21 ENCOUNTER — Ambulatory Visit: Payer: Managed Care, Other (non HMO) | Admitting: Nurse Practitioner

## 2023-02-21 ENCOUNTER — Encounter: Payer: Self-pay | Admitting: Family Medicine

## 2023-02-21 NOTE — Progress Notes (Deleted)
02/21/2023 Janice Arnold [id number] 28-Dec-1980   Chief Complaint: Nausea and vomiting   History of Present Illness: Janice Arnold is a 42 year old female with a past medical history anxiety, depression, obesity, IDA, B12 deficiency, seizures with subdural hematoma 05/2022, compression fracture T spine 4-6, hepatic steatosis, rectal adenoma and GERD. She was initially seen in office by Dr. Tomasa Rand 02/21/2022 for further evaluation regarding iron deficiency anemia, N/V, elevated LFTs and unintentional weight loss. Her IDA was thought to be secondary to menstrual blood loss and possible alcohol associated bone marrow suppression. EGD and colonoscopy were done 03/27/2022  She is scheduled for a hysterectomy on 03/04/2023.      Latest Ref Rng & Units 01/07/2023    8:19 AM 12/20/2022    8:32 AM 11/20/2022   11:52 AM  Hepatic Function  Total Protein 6.0 - 8.3 g/dL 6.7  6.7  6.3   Albumin 3.5 - 5.2 g/dL 4.1  4.2  4.3   AST 0 - 37 U/L 31  53  45   ALT 0 - 35 U/L 15  39  21   Alk Phosphatase 39 - 117 U/L 35  49  66   Total Bilirubin 0.2 - 1.2 mg/dL 0.6  0.8  0.7   Bilirubin, Direct 0.00 - 0.40 mg/dL   4.09        Latest Ref Rng & Units 12/20/2022    8:32 AM 11/12/2022   11:03 AM 10/15/2022   10:29 AM  CBC  WBC 4.0 - 10.5 K/uL 6.1  6.2  9.1   Hemoglobin 12.0 - 15.0 g/dL 81.1  91.4  78.2   Hematocrit 36.0 - 46.0 % 34.7  33.1  32.9   Platelets 150.0 - 400.0 K/uL 279.0  235.0  556.0         Latest Ref Rng & Units 01/07/2023    8:19 AM 12/20/2022    8:32 AM 11/12/2022   11:03 AM  BMP  Glucose 70 - 99 mg/dL 77  86  94   BUN 6 - 23 mg/dL 15  10  13    Creatinine 0.40 - 1.20 mg/dL 9.56  2.13  0.86   Sodium 135 - 145 mEq/L 138  138  136   Potassium 3.5 - 5.1 mEq/L 4.3  3.8  3.6   Chloride 96 - 112 mEq/L 103  106  103   CO2 19 - 32 mEq/L 25  22  24    Calcium 8.4 - 10.5 mg/dL 9.5  9.2  9.5      RUQ sonogram 05/29/2022: Gallbladder: No gallstones or wall thickening visualized. No  sonographic Murphy sign noted by sonographer.   Common bile duct: Diameter: Normal caliber, 4 mm   Liver: Insert fatty low portal vein is patent on color Doppler imaging with normal direction of blood flow towards the liver.   IMPRESSION: Fatty liver.  Head CT 06/06/2022: 1. 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 No acute intracranial hemorrhage.  Brain MRI 06/06/2022: When comparing across modalities to same day CT head, similar size of a right cerebral convexity subdural hemorrhage. Similar leftward midline shift.  Head CT 10/16/2022: Unremarkable CT scan of the head without contrast.  Compared to previous CT head 06/06/2021 the right convexity subdural hematoma appears to have resolved    PAST GI PROCEDURES:  EGD 03/27/2022: - The examined portions of the nasopharynx, oropharynx and larynx were  normal.  - Z-line irregular. - Erosive gastropathy with no bleeding and no stigmata of recent bleeding. Biopsied. These findings may be a source of nausea/vomiting  - Erythematous mucosa in the gastric body. Biopsied.  - Normal examined duodenum. Biopsied. - Treated with Omeprazole 20mg  every day x 4 weeks  Colonoscopy 03/27/2022: - Diverticulosis in the ascending colon.  - One 8 mm polyp in the distal rectum, removed with a cold snare. Resected and retrieved. - The examined portion of the ileum was normal.  - The distal rectum and anal verge are normal on retroflexion view.  - No abnormalities to explain weight loss or iron deficiency anemia.  - Suspect patient's anemia likely multifactorial, from menorrhagia and alcohol - Recall colonoscopy 7 years  1. Surgical [P], duodenal BENIGN DUODENAL MUCOSA WITH NO DIAGNOSTIC ABNORMALITY 2. 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 PYLORI,  INTESTINAL METAPLASIA, DYSPLASIA AND CARCINOMA 3. Surgical [P], gastric body MILD CHRONIC GASTRITIS WITH REACTIVE EPITHELIAL CHANGES NEGATIVE FOR H. PYLORI, INTESTINAL METAPLASIA, DYSPLASIA AND CARCINOMA 4. Surgical [P], colon, rectum, polyp (1) TUBULAR ADENOMA NEGATIVE FOR HIGH-GRADE DYSPLASIA AND CARCINOMA   Current Medications, Allergies, Past Medical History, Past Surgical History, Family History and Social History were reviewed in [employer] record.   Review of Systems:   Constitutional: Negative for fever, sweats, chills or weight loss.  Respiratory: Negative for shortness of breath.   Cardiovascular: Negative for chest pain, palpitations and leg swelling.  Gastrointestinal: See HPI.  Musculoskeletal: Negative for back pain or muscle aches.  Neurological: Negative for dizziness, headaches or paresthesias.    Physical Exam: Wt Readings from Last 3 Encounters:  02/12/23 231 lb (104.8 kg)  01/09/23 229 lb (103.9 kg)  12/25/22 227 lb 1.2 oz (103 kg)    LMP 01/29/2023 (Approximate)   General: in no acute distress. Head: Normocephalic and atraumatic. Eyes: No scleral icterus. Conjunctiva pink . Ears: Normal auditory acuity. Mouth: Dentition intact. No ulcers or lesions.  Lungs: Clear throughout to auscultation. Heart: Regular rate and rhythm, no murmur. Abdomen: Soft, nontender and nondistended. No masses or hepatomegaly. Normal bowel sounds x 4 quadrants.  Rectal: Deferred Musculoskeletal: Symmetrical with no gross deformities. Extremities: No edema. Neurological: Alert oriented x 4. No focal deficits.  Psychological: Alert and cooperative. Normal mood and affect  Assessment and Recommendations:  42 year old female with N/V -[facility]K HIDA scan -Check gastric empty study if CCK HIDA normal   Elevated IgA level -Immunoglobulins A/E/G/M,  SPEP -Refer to hematology vs immunologist if IgA level remains elevated   History of elevated LFTs. Hepatic  steatosis per RUQ sono 05/2022. -Hep B vaccination if not already received  -tTG, A1AT, ceruloplasmin level to complete hepatology serologies  -Reduce carbohydrates in diet ie: reduce bread/pasta/potato/rice and sweets, exercise as tolerated and lose weight to reduce the risk of developing fatty liver disease   IDA. Scheduled for a hysterectomy 03/04/2023.  Tubular adenomatous rectal polyp per colonoscopy 03/2022 -Next colonoscopy due 03/2029

## 2023-02-24 ENCOUNTER — Other Ambulatory Visit: Payer: Self-pay | Admitting: Family Medicine

## 2023-02-24 ENCOUNTER — Encounter (HOSPITAL_COMMUNITY)
Admission: RE | Admit: 2023-02-24 | Discharge: 2023-02-24 | Disposition: A | Discharge status: Home / Self Care | Payer: Managed Care, Other (non HMO) | Source: Ambulatory Visit | Attending: Obstetrics and Gynecology | Admitting: Obstetrics and Gynecology

## 2023-02-24 DIAGNOSIS — Z01812 Encounter for preprocedural laboratory examination: Secondary | ICD-10-CM | POA: Diagnosis present

## 2023-02-24 DIAGNOSIS — Z01818 Encounter for other preprocedural examination: Secondary | ICD-10-CM

## 2023-02-24 LAB — CBC
HCT: 43.1 % (ref 36.0–46.0)
Hemoglobin: 13.7 g/dL (ref 12.0–15.0)
MCH: 30.4 pg (ref 26.0–34.0)
MCHC: 31.8 g/dL (ref 30.0–36.0)
MCV: 95.8 fL (ref 80.0–100.0)
Platelets: 238 10*3/uL (ref 150–400)
RBC: 4.5 MIL/uL (ref 3.87–5.11)
RDW: 15.9 % — ABNORMAL HIGH (ref 11.5–15.5)
WBC: 11.7 10*3/uL — ABNORMAL HIGH (ref 4.0–10.5)
nRBC: 0 % (ref 0.0–0.2)

## 2023-02-24 LAB — BASIC METABOLIC PANEL
Anion gap: 15 (ref 5–15)
BUN: 11 mg/dL (ref 6–20)
CO2: 25 mmol/L (ref 22–32)
Calcium: 9.2 mg/dL (ref 8.9–10.3)
Chloride: 98 mmol/L (ref 98–111)
Creatinine, Ser: 1.06 mg/dL — ABNORMAL HIGH (ref 0.44–1.00)
GFR, Estimated: >60 mL/min (ref >60)
Glucose, Bld: 100 mg/dL — ABNORMAL HIGH (ref 70–99)
Potassium: 3.2 mmol/L — ABNORMAL LOW (ref 3.5–5.1)
Sodium: 138 mmol/L (ref 135–145)

## 2023-02-26 ENCOUNTER — Encounter: Payer: Self-pay | Admitting: Family

## 2023-02-26 ENCOUNTER — Telehealth: Payer: Self-pay | Admitting: Family Medicine

## 2023-02-26 DIAGNOSIS — E876 Hypokalemia: Secondary | ICD-10-CM

## 2023-02-26 DIAGNOSIS — R112 Nausea with vomiting, unspecified: Secondary | ICD-10-CM

## 2023-02-26 NOTE — Telephone Encounter (Signed)
Called pt about pre-op low K.  Per pt, started vomiting again that am.  So stopped taking all meds x/zofran.  Advised to get K in her.  Need to reck labs on Friday.  Will do referral to endo as chronically high dose K.  Cortisol has been normal but wonder if missing some other disorder.

## 2023-02-27 ENCOUNTER — Encounter: Payer: Self-pay | Admitting: Family

## 2023-02-28 ENCOUNTER — Other Ambulatory Visit: Payer: Managed Care, Other (non HMO)

## 2023-03-03 ENCOUNTER — Encounter: Payer: Self-pay | Admitting: Family

## 2023-03-03 ENCOUNTER — Ambulatory Visit: Payer: Managed Care, Other (non HMO) | Admitting: Sports Medicine

## 2023-03-03 NOTE — Anesthesia Preprocedure Evaluation (Signed)
Anesthesia Evaluation  Patient identified by MRN, date of birth, ID band Patient awake    Reviewed: Allergy & Precautions, NPO status , Patient's Chart, lab work & pertinent test results  History of Anesthesia Complications (+) PONV and history of anesthetic complications  Airway Mallampati: II  TM Distance: >3 FB Neck ROM: Full    Dental no notable dental hx. (+) Loose, Poor Dentition,    Pulmonary    Pulmonary exam normal breath sounds clear to auscultation       Cardiovascular Normal cardiovascular exam Rhythm:Regular Rate:Normal  4/24 TEE 1. Left ventricular ejection fraction, by estimation, is 60 to 65%. The  left ventricle has normal function. The left ventricle has no regional  wall motion abnormalities. There is mild concentric left ventricular  hypertrophy. Left ventricular diastolic  parameters were normal.   2. Right ventricular systolic function is normal. The right ventricular  size is normal. Tricuspid regurgitation signal is inadequate for assessing  PA pressure.   3. The mitral valve is normal in structure. Trivial mitral valve  regurgitation. No evidence of mitral stenosis.   4. The aortic valve is tricuspid. Aortic valve regurgitation is not  visualized. No aortic stenosis is present.   5. The inferior vena cava is normal in size with greater than 50%  respiratory variability, suggesting right atrial pressure of 3 mmHg.     Neuro/Psych Seizures -, Well Controlled,  PSYCHIATRIC DISORDERS Anxiety Depression    Chronic B12 deficiency    GI/Hepatic ,GERD  ,,(+)     substance abuse  alcohol use  Endo/Other    Renal/GU Lab Results      Component                Value               Date                      NA                       138                 02/24/2023                CL                       98                  02/24/2023                K                        3.2 (L)             02/24/2023                 CO2                      25                  02/24/2023                BUN                      11                  02/24/2023  CREATININE               1.06 (H)            02/24/2023                GFRNONAA                 >60                 02/24/2023                CALCIUM                  9.2                 02/24/2023                ALBUMIN                  4.1                 01/07/2023                GLUCOSE                  100 (H)             02/24/2023                Musculoskeletal negative musculoskeletal ROS (+)  T4-T6 compression fx   Abdominal   Peds  Hematology Lab Results      Component                Value               Date                      WBC                      11.7 (H)            02/24/2023                HGB                      13.7                02/24/2023                HCT                      43.1                02/24/2023                MCV                      95.8                02/24/2023                PLT                      238                 02/24/2023              Anesthesia Other Findings   Reproductive/Obstetrics negative OB ROS  Anesthesia Physical Anesthesia Plan  ASA: 2  Anesthesia Plan: General   Post-op Pain Management: Ketamine IV*, Ofirmev IV (intra-op)* and Toradol IV (intra-op)*   Induction: Intravenous  PONV Risk Score and Plan: 4 or greater and Treatment may vary due to age or medical condition, Ondansetron, Dexamethasone and Midazolam  Airway Management Planned: Oral ETT  Additional Equipment: None  Intra-op Plan:   Post-operative Plan: Extubation in OR  Informed Consent:      Dental advisory given  Plan Discussed with:   Anesthesia Plan Comments:          Anesthesia Quick Evaluation

## 2023-03-03 NOTE — H&P (Signed)
Janice Arnold is an 42 y.o. female. Refractory menorrhagia for definitive surgery  Pertinent Gynecological History: Menses: flow is moderate Bleeding: dysfunctional uterine bleeding Contraception: none DES exposure: denies Blood transfusions: none Sexually transmitted diseases: no past history Previous GYN Procedures: DNC  Last mammogram: normal Date: 2023 Last pap: normal Date: 2023 OB History: G4, P3   Menstrual History: Menarche age: 68 Patient's last menstrual period was 02/06/2023 (approximate).    Past Medical History:  Diagnosis Date   Alcohol abuse    history of heavy alcohol abuse, last drank alcohol in 06/2022   Anemia 2023   s/p iron infusions   Anxiety    Follows with PCP. Dr. Dewitt Arnold.   Compression fracture of body of thoracic vertebra (HCC) 11/05/2022   T4, T5, T6 / Follows w/ orthopedic, Dr. Willia Arnold. Fractures occurred while patient was having a seizure, per patient.   Cyclic vomiting syndrome    hospital admission on 05/28/22 for vomiting, follows with gastroenterologist Dr. Tiajuana Arnold.   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 Tiajuana Arnold.   GERD (gastroesophageal reflux disease)    takes omeprazole daily   Heart murmur    since childhood   Hyperlipidemia    11/20/22 lipid panel in Epic   Hypokalemia 10/15/2022   K+ level 2.5   Menorrhagia    Palpitations    Follows w/ cardiology, Janice Paganini, NP. 11/14/2022 echocardiogram EF 60 65%, 12/31/22 Event heart monitor results in Epic.   Prolonged QT interval    Follows w/ cardiology, Janice Person, NP.   Seizure (HCC) 06/06/2022   Follows with neurologist, Dr. Leim Arnold @ [facility]. Seizures were thought to be related to stopping alcohol. However, patient has had seizures months after quitting alcohol. As of 02/06/23, last seizure was on 11/07/22.  Keppra was then increased to bid.   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 See 11/05/22 Head CT and MRI in Epic. Resolved per pt on 02/06/23.   Tremors of nervous system    whole body tremors, follows with Dr. Leim Arnold @ [facility].   Vitamin deficiency 2023   B12 deficiency, receiving monthly B12 injections / Vitamin D deficiency, taking supplement every two weeks   Wears contact lenses    Wears glasses     Past Surgical History:  Procedure Laterality Date   COLONOSCOPY WITH ESOPHAGOGASTRODUODENOSCOPY (EGD)  03/27/2022   one rectal polyp   LAPAROSCOPIC APPENDECTOMY N/A 03/02/2022   Procedure: APPENDECTOMY LAPAROSCOPIC;  Surgeon: Janice Rudd, MD;  Location: [facility];  Service: General;  Laterality: N/A;    Family History  Problem Relation Age of Onset   Breast cancer Mother    Cervical cancer Mother        ovarian   Bone cancer Mother    Breast cancer Maternal Grandmother    Colon cancer Maternal Great-grandmother 44   Esophageal cancer Neg Hx    Stomach cancer Neg Hx    Seizures Neg Hx    Stroke Neg Hx     Social History:  reports that she has never smoked. She has never used smokeless tobacco. She reports that she does not currently use alcohol. She reports that she does not use drugs.  Allergies: No Known Allergies  No medications prior to admission.  Review of Systems  Constitutional: Negative.   All other systems reviewed and are negative.   Height 5\' 7"  (1.702 m), weight 108.9 kg, last menstrual period 02/06/2023. Physical Exam Constitutional:      Appearance: Normal appearance. She is normal weight.  HENT:     Head: Normocephalic.  Cardiovascular:     Rate and Rhythm: Normal rate and regular rhythm.     Pulses: Normal pulses.     Heart sounds: Normal heart sounds.  Pulmonary:     Effort: Pulmonary effort is normal.     Breath sounds: Normal breath sounds.  Abdominal:      General: Bowel sounds are normal.     Palpations: Abdomen is soft.  Genitourinary:    General: Normal vulva.  Musculoskeletal:        General: Normal range of motion.     Cervical back: Normal range of motion and neck supple.  Skin:    General: Skin is warm and dry.  Neurological:     General: No focal deficit present.     Mental Status: She is alert and oriented to Arnold, place, and time.     No results found for this or any previous visit (from the past 24 hour(s)).  No results found.  Assessment/Plan: Refractory Menorrhagia. daVinci TLH and bilateral salpingectomy Risks of infection,bleeding, injury to surrounding organs with need for repair discussed. Surgical consent done.  Janice Arnold J 03/03/2023, 8:53 PM

## 2023-03-04 ENCOUNTER — Encounter (HOSPITAL_BASED_OUTPATIENT_CLINIC_OR_DEPARTMENT_OTHER)
Admission: RE | Disposition: A | Discharge status: Home / Self Care | Payer: Self-pay | Source: Home / Self Care | Attending: Obstetrics and Gynecology

## 2023-03-04 ENCOUNTER — Ambulatory Visit (HOSPITAL_BASED_OUTPATIENT_CLINIC_OR_DEPARTMENT_OTHER): Payer: Managed Care, Other (non HMO) | Admitting: Anesthesiology

## 2023-03-04 ENCOUNTER — Other Ambulatory Visit: Payer: Self-pay

## 2023-03-04 ENCOUNTER — Ambulatory Visit (HOSPITAL_BASED_OUTPATIENT_CLINIC_OR_DEPARTMENT_OTHER)
Admission: RE | Admit: 2023-03-04 | Discharge: 2023-03-04 | Disposition: A | Discharge status: Home / Self Care | Payer: Managed Care, Other (non HMO) | Attending: Obstetrics and Gynecology | Admitting: Obstetrics and Gynecology

## 2023-03-04 ENCOUNTER — Encounter (HOSPITAL_BASED_OUTPATIENT_CLINIC_OR_DEPARTMENT_OTHER): Payer: Self-pay | Admitting: Obstetrics and Gynecology

## 2023-03-04 DIAGNOSIS — R569 Unspecified convulsions: Secondary | ICD-10-CM | POA: Insufficient documentation

## 2023-03-04 DIAGNOSIS — Z6837 Body mass index (BMI) 37.0-37.9, adult: Secondary | ICD-10-CM | POA: Insufficient documentation

## 2023-03-04 DIAGNOSIS — Z538 Procedure and treatment not carried out for other reasons: Secondary | ICD-10-CM | POA: Diagnosis not present

## 2023-03-04 DIAGNOSIS — N920 Excessive and frequent menstruation with regular cycle: Secondary | ICD-10-CM | POA: Insufficient documentation

## 2023-03-04 DIAGNOSIS — N939 Abnormal uterine and vaginal bleeding, unspecified: Secondary | ICD-10-CM | POA: Diagnosis present

## 2023-03-04 DIAGNOSIS — Z539 Procedure and treatment not carried out, unspecified reason: Secondary | ICD-10-CM

## 2023-03-04 DIAGNOSIS — Z9049 Acquired absence of other specified parts of digestive tract: Secondary | ICD-10-CM | POA: Diagnosis not present

## 2023-03-04 DIAGNOSIS — Z01818 Encounter for other preprocedural examination: Secondary | ICD-10-CM

## 2023-03-04 HISTORY — DX: Palpitations: R00.2

## 2023-03-04 HISTORY — DX: Cardiac murmur, unspecified: R01.1

## 2023-03-04 HISTORY — DX: Abnormal electrocardiogram (ECG) (EKG): R94.31

## 2023-03-04 HISTORY — DX: Other specified postprocedural states: Z98.890

## 2023-03-04 HISTORY — DX: Gastro-esophageal reflux disease without esophagitis: K21.9

## 2023-03-04 HISTORY — DX: Edema, unspecified: R60.9

## 2023-03-04 HISTORY — PX: ROBOTIC ASSISTED LAPAROSCOPIC HYSTERECTOMY AND SALPINGECTOMY: SHX6379

## 2023-03-04 HISTORY — DX: Nausea with vomiting, unspecified: R11.2

## 2023-03-04 HISTORY — DX: Presence of spectacles and contact lenses: Z97.3

## 2023-03-04 LAB — TYPE AND SCREEN
ABO/RH(D): A POS
Antibody Screen: NEGATIVE

## 2023-03-04 LAB — POCT PREGNANCY, URINE
Preg Test, Ur: NEGATIVE
Preg Test, Ur: NEGATIVE

## 2023-03-04 SURGERY — XI ROBOTIC ASSISTED LAPAROSCOPIC HYSTERECTOMY AND SALPINGECTOMY
Anesthesia: General | Laterality: Bilateral

## 2023-03-04 MED ORDER — PROPOFOL 10 MG/ML IV BOLUS
INTRAVENOUS | Status: DC | PRN
Start: 2023-03-04 — End: 2023-03-04
  Administered 2023-03-04: 150 mg via INTRAVENOUS

## 2023-03-04 MED ORDER — LACTATED RINGERS IV SOLN
INTRAVENOUS | Status: DC
  Administered 2023-03-04: 1000 mL via INTRAVENOUS

## 2023-03-04 MED ORDER — OXYCODONE HCL 5 MG PO TABS
ORAL_TABLET | ORAL | Status: AC
  Filled 2023-03-04: qty 1

## 2023-03-04 MED ORDER — ONDANSETRON HCL 4 MG/2ML IJ SOLN
INTRAMUSCULAR | Status: DC | PRN
Start: 2023-03-04 — End: 2023-03-04
  Administered 2023-03-04: 4 mg via INTRAVENOUS

## 2023-03-04 MED ORDER — MIDAZOLAM HCL 2 MG/2ML IJ SOLN
INTRAMUSCULAR | Status: AC
  Filled 2023-03-04: qty 2

## 2023-03-04 MED ORDER — POVIDONE-IODINE 10 % EX SWAB
2.0000 | Freq: Once | CUTANEOUS | Status: AC
  Administered 2023-03-04: 2 via TOPICAL

## 2023-03-04 MED ORDER — MIDAZOLAM HCL 2 MG/2ML IJ SOLN
INTRAMUSCULAR | Status: DC | PRN
  Administered 2023-03-04: 2 mg via INTRAVENOUS

## 2023-03-04 MED ORDER — KETOROLAC TROMETHAMINE 30 MG/ML IJ SOLN
INTRAMUSCULAR | Status: DC | PRN
  Administered 2023-03-04: 30 mg via INTRAVENOUS

## 2023-03-04 MED ORDER — LIDOCAINE HCL (PF) 2 % IJ SOLN
INTRAMUSCULAR | Status: AC
  Filled 2023-03-04: qty 5

## 2023-03-04 MED ORDER — ROCURONIUM BROMIDE 10 MG/ML (PF) SYRINGE
PREFILLED_SYRINGE | INTRAVENOUS | Status: AC
  Filled 2023-03-04: qty 10

## 2023-03-04 MED ORDER — LIDOCAINE 2% (20 MG/ML) 5 ML SYRINGE
INTRAMUSCULAR | Status: DC | PRN
  Administered 2023-03-04: 100 mg via INTRAVENOUS

## 2023-03-04 MED ORDER — HYDROMORPHONE HCL 1 MG/ML IJ SOLN: 0.2500 mg | INTRAMUSCULAR | Status: DC | PRN

## 2023-03-04 MED ORDER — SUGAMMADEX SODIUM 200 MG/2ML IV SOLN
INTRAVENOUS | Status: DC | PRN
  Administered 2023-03-04: 500 mg via INTRAVENOUS

## 2023-03-04 MED ORDER — SCOPOLAMINE 1 MG/3DAYS TD PT72
MEDICATED_PATCH | TRANSDERMAL | Status: AC
  Filled 2023-03-04: qty 1

## 2023-03-04 MED ORDER — CEFAZOLIN SODIUM-DEXTROSE 2-4 GM/100ML-% IV SOLN
INTRAVENOUS | Status: AC
  Filled 2023-03-04: qty 100

## 2023-03-04 MED ORDER — OXYCODONE HCL 5 MG/5ML PO SOLN: 5.0000 mg | Freq: Once | ORAL | Status: AC | PRN

## 2023-03-04 MED ORDER — ROCURONIUM BROMIDE 10 MG/ML (PF) SYRINGE
PREFILLED_SYRINGE | INTRAVENOUS | Status: DC | PRN
  Administered 2023-03-04: 70 mg via INTRAVENOUS

## 2023-03-04 MED ORDER — DEXMEDETOMIDINE HCL IN NACL 80 MCG/20ML IV SOLN
INTRAVENOUS | Status: DC | PRN
  Administered 2023-03-04: 8 ug via INTRAVENOUS

## 2023-03-04 MED ORDER — KETOROLAC TROMETHAMINE 30 MG/ML IJ SOLN: 30.0000 mg | Freq: Once | INTRAMUSCULAR | Status: DC | PRN

## 2023-03-04 MED ORDER — SCOPOLAMINE 1 MG/3DAYS TD PT72
1.0000 | MEDICATED_PATCH | TRANSDERMAL | Status: DC
  Administered 2023-03-04: 1.5 mg via TRANSDERMAL

## 2023-03-04 MED ORDER — KETAMINE HCL 50 MG/5ML IJ SOSY
PREFILLED_SYRINGE | INTRAMUSCULAR | Status: AC
  Filled 2023-03-04: qty 5

## 2023-03-04 MED ORDER — CEFAZOLIN SODIUM-DEXTROSE 2-4 GM/100ML-% IV SOLN
2.0000 g | INTRAVENOUS | Status: AC
  Administered 2023-03-04: 2 g via INTRAVENOUS

## 2023-03-04 MED ORDER — FENTANYL CITRATE (PF) 100 MCG/2ML IJ SOLN
INTRAMUSCULAR | Status: AC
  Filled 2023-03-04: qty 2

## 2023-03-04 MED ORDER — BUPIVACAINE HCL (PF) 0.25 % IJ SOLN
INTRAMUSCULAR | Status: DC | PRN
  Administered 2023-03-04: 10 mL

## 2023-03-04 MED ORDER — KETOROLAC TROMETHAMINE 30 MG/ML IJ SOLN
INTRAMUSCULAR | Status: AC
  Filled 2023-03-04: qty 1

## 2023-03-04 MED ORDER — HYDROCODONE-ACETAMINOPHEN 5-325 MG PO TABS: 1.0000 | ORAL_TABLET | Freq: Four times a day (QID) | ORAL | 0 refills | Status: DC | PRN

## 2023-03-04 MED ORDER — FENTANYL CITRATE (PF) 100 MCG/2ML IJ SOLN
INTRAMUSCULAR | Status: DC | PRN
  Administered 2023-03-04 (×2): 50 ug via INTRAVENOUS

## 2023-03-04 MED ORDER — 0.9 % SODIUM CHLORIDE (POUR BTL) OPTIME
TOPICAL | Status: DC | PRN
  Administered 2023-03-04: 1000 mL

## 2023-03-04 MED ORDER — KETAMINE HCL 10 MG/ML IJ SOLN
INTRAMUSCULAR | Status: DC | PRN
  Administered 2023-03-04: 20 mg via INTRAVENOUS

## 2023-03-04 MED ORDER — OXYCODONE HCL 5 MG PO TABS
5.0000 mg | ORAL_TABLET | Freq: Once | ORAL | Status: AC | PRN
  Administered 2023-03-04: 5 mg via ORAL

## 2023-03-04 MED ORDER — DEXAMETHASONE SODIUM PHOSPHATE 10 MG/ML IJ SOLN
INTRAMUSCULAR | Status: DC | PRN
  Administered 2023-03-04: 10 mg via INTRAVENOUS

## 2023-03-04 MED ORDER — PHENYLEPHRINE HCL (PRESSORS) 10 MG/ML IV SOLN
INTRAVENOUS | Status: DC | PRN
  Administered 2023-03-04 (×2): 160 ug via INTRAVENOUS

## 2023-03-04 MED ORDER — EPHEDRINE SULFATE (PRESSORS) 50 MG/ML IJ SOLN
INTRAMUSCULAR | Status: DC | PRN
  Administered 2023-03-04: 10 mg via INTRAVENOUS
  Administered 2023-03-04: 5 mg via INTRAVENOUS

## 2023-03-04 MED ORDER — STERILE WATER FOR IRRIGATION IR SOLN
Status: DC | PRN
  Administered 2023-03-04: 500 mL

## 2023-03-04 SURGICAL SUPPLY — 58 items
ADH SKN CLS APL DERMABOND .7 (GAUZE/BANDAGES/DRESSINGS) ×1
BARRIER ADHS 3X4 INTERCEED (GAUZE/BANDAGES/DRESSINGS) IMPLANT
BRR ADH 4X3 ABS CNTRL BYND (GAUZE/BANDAGES/DRESSINGS)
CATH FOLEY 3WAY 5CC 16FR (CATHETERS) ×1 IMPLANT
COVER BACK TABLE 60X90IN (DRAPES) ×1 IMPLANT
COVER SURGICAL LIGHT HANDLE (MISCELLANEOUS) IMPLANT
COVER TIP SHEARS 8 DVNC (MISCELLANEOUS) ×1 IMPLANT
DEFOGGER SCOPE WARMER CLEARIFY (MISCELLANEOUS) ×1 IMPLANT
DERMABOND ADVANCED .7 DNX12 (GAUZE/BANDAGES/DRESSINGS) ×1 IMPLANT
DRAPE ARM DVNC X/XI (DISPOSABLE) ×4 IMPLANT
DRAPE COLUMN DVNC XI (DISPOSABLE) ×1 IMPLANT
DRAPE SURG IRRIG POUCH 19X23 (DRAPES) ×1 IMPLANT
DRAPE UTILITY XL STRL (DRAPES) ×1 IMPLANT
DRIVER NDL MEGA SUTCUT DVNCXI (INSTRUMENTS) ×1 IMPLANT
DRIVER NDLE MEGA SUTCUT DVNCXI (INSTRUMENTS) IMPLANT
DURAPREP 26ML APPLICATOR (WOUND CARE) ×1 IMPLANT
ELECT REM PT RETURN 9FT ADLT (ELECTROSURGICAL) ×1
ELECTRODE REM PT RTRN 9FT ADLT (ELECTROSURGICAL) ×1 IMPLANT
FORCEPS BPLR LNG DVNC XI (INSTRUMENTS) ×1 IMPLANT
FORCEPS LONG TIP 8 DVNC XI (FORCEP) ×1 IMPLANT
GAUZE 4X4 16PLY ~~LOC~~+RFID DBL (SPONGE) IMPLANT
GLOVE BIO SURGEON STRL SZ7.5 (GLOVE) ×3 IMPLANT
HOLDER FOLEY CATH W/STRAP (MISCELLANEOUS) IMPLANT
IRRIG SUCT STRYKERFLOW 2 WTIP (MISCELLANEOUS) ×1
IRRIGATION SUCT STRKRFLW 2 WTP (MISCELLANEOUS) ×1 IMPLANT
KIT PINK PAD W/HEAD ARE REST (MISCELLANEOUS) ×1
KIT PINK PAD W/HEAD ARM REST (MISCELLANEOUS) ×1 IMPLANT
KIT TURNOVER CYSTO (KITS) ×1 IMPLANT
LEGGING LITHOTOMY PAIR STRL (DRAPES) ×1 IMPLANT
NDL INSUFFLATION 14GA 150MM (NEEDLE) ×1 IMPLANT
NEEDLE INSUFFLATION 14GA 150MM (NEEDLE) ×2 IMPLANT
OBTURATOR OPTICAL STND 8 DVNC (TROCAR) ×1
OBTURATOR OPTICALSTD 8 DVNC (TROCAR) ×1 IMPLANT
OCCLUDER COLPOPNEUMO (BALLOONS) ×1 IMPLANT
PACK ROBOT WH (CUSTOM PROCEDURE TRAY) ×1 IMPLANT
PACK ROBOTIC GOWN (GOWN DISPOSABLE) ×1 IMPLANT
PAD OB MATERNITY 4.3X12.25 (PERSONAL CARE ITEMS) ×1 IMPLANT
PAD PREP 24X48 CUFFED NSTRL (MISCELLANEOUS) ×1 IMPLANT
PROTECTOR NERVE ULNAR (MISCELLANEOUS) ×2 IMPLANT
SCISSORS MNPLR CVD DVNC XI (INSTRUMENTS) ×1 IMPLANT
SEAL UNIV 5-12 XI (MISCELLANEOUS) ×3 IMPLANT
SET IRRIG Y TYPE TUR BLADDER L (SET/KITS/TRAYS/PACK) IMPLANT
SET TRI-LUMEN FLTR TB AIRSEAL (TUBING) ×1 IMPLANT
SLEEVE SCD COMPRESS KNEE MED (STOCKING) ×1 IMPLANT
SPIKE FLUID TRANSFER (MISCELLANEOUS) ×2 IMPLANT
SUT VIC AB 0 CT1 27 (SUTURE) ×1
SUT VIC AB 0 CT1 27XBRD ANBCTR (SUTURE) ×1 IMPLANT
SUT VICRYL 0 UR6 27IN ABS (SUTURE) ×1 IMPLANT
SUT VICRYL RAPIDE 4/0 PS 2 (SUTURE) ×2 IMPLANT
SUT VLOC 180 0 9IN GS21 (SUTURE) ×1 IMPLANT
TIP RUMI ORANGE 6.7MMX12CM (TIP) IMPLANT
TIP UTERINE 5.1X6CM LAV DISP (MISCELLANEOUS) IMPLANT
TIP UTERINE 6.7X10CM GRN DISP (MISCELLANEOUS) IMPLANT
TIP UTERINE 6.7X6CM WHT DISP (MISCELLANEOUS) IMPLANT
TIP UTERINE 6.7X8CM BLUE DISP (MISCELLANEOUS) IMPLANT
TOWEL OR 17X24 6PK STRL BLUE (TOWEL DISPOSABLE) ×1 IMPLANT
TROCAR PORT AIRSEAL 8X120 (TROCAR) ×1 IMPLANT
WATER STERILE IRR 1000ML POUR (IV SOLUTION) ×1 IMPLANT

## 2023-03-04 NOTE — Anesthesia Procedure Notes (Signed)
Procedure Name: Intubation Date/Time: 03/04/2023 1:41 PM  Performed by: Francie Massing, CRNAPre-anesthesia Checklist: Patient identified, Emergency Drugs available, Suction available and Patient being monitored Patient Re-evaluated:Patient Re-evaluated prior to induction Oxygen Delivery Method: Circle system utilized Preoxygenation: Pre-oxygenation with 100% oxygen Induction Type: IV induction Ventilation: Mask ventilation without difficulty Laryngoscope Size: Mac and 3 Grade View: Grade I Tube type: Oral Tube size: 7.0 mm Number of attempts: 1 Airway Equipment and Method: Stylet and Oral airway Placement Confirmation: ETT inserted through vocal cords under direct vision, positive ETCO2 and breath sounds checked- equal and bilateral Secured at: 22 cm Tube secured with: Tape Dental Injury: Teeth and Oropharynx as per pre-operative assessment

## 2023-03-04 NOTE — Op Note (Unsigned)
NAME: Janice Arnold, Janice Arnold MEDICAL RECORD NO: [id number] ACCOUNT NO: [id number] DATE OF BIRTH: 1981-04-03 FACILITY: [facility] LOCATION: [facility] PHYSICIAN: Lenoard Aden, MD  Operative Report   DATE OF PROCEDURE: 03/04/2023  PREOPERATIVE DIAGNOSIS:  Abnormal uterine bleeding.  POSTOPERATIVE DIAGNOSIS:  Abnormal uterine bleeding.  PROCEDURE:  Failed laparoscopic entry at closed attempt, open attempt and attempt of Veress needle at Palmer's point unsuccessful.  SURGEON:  Lenoard Aden, MD.  ASSISTANTRoseanne Reno RNFA.  ANESTHESIA:  Local, general.  ESTIMATED BLOOD LOSS:  Minimal.  COMPLICATIONS:  None.  DRAINS:  Foley.  COUNTS:  Correct.  DISPOSITION:  The patient to recovery in good condition.  BRIEF OPERATIVE NOTE:  After being apprised of the risks of anesthesia, infection, bleeding, injury to surrounding organs, possible need for repair, delayed versus immediate complications including bowel and bladder injury, possible need for repair.  The  patient was brought to the operating room where she was administered general anesthetic without complications.  Prepped and draped in usual sterile fashion.  Foley catheter placed.  Rumi retractor placed vaginally without difficulty.  Exam under  anesthesia is limited due to morbid obesity, however, previous ultrasound has confirmed a small anteflexed uterus with no adnexal masses appreciated.  At this time, attention was turned to the infraumbilical area. Infraumbilical incision was made.   Veress needle placed. Opening pressure of 10 is noted and upon insufflation some evidence of subcutaneous insufflation was noted, so the Veress needle was removed.  Second attempt with long Veress needle in standard fashion was unsuccessful.  Attempts at open laparoscopy are limited by patient's  morbid obesity and abdominal wall laxity with inability to identify the fascia for intraabdominal entry, incision was extended for better exposure and  retractors placed with no success. There was questionable evidence of periumbilical scarring due to previous laparoscopic appendectomy. Additionally,  D During the procedure the patient is noted to have labile blood pressures for no apparent reason.  Subsequently, I attempted then to entry at Palmer's point (stomach emptied) 3cm below ninth intercostal space x 1 attempt with no success due to recurrent [place] insufflation.  A At this time due to risk for severe  morbidity due to patient's body habitus and inability to make a safe laparoscopic entry via open or closed attempts, procedure was terminated.  I feel there is significant morbidity to an abdominal approach due to the patients medical issues and body habitus in a non urgent surgical setting. Incision was closed using a 0 Vicryl, 4-0 Vicryl suture.  Marcaine solution was placed.  Rumi retractor is  removed vaginally.  Foley reveals clear urine.  Foley catheter was removed without difficulty.  The patient tolerated the procedure well and was transferred to recovery in good condition.   PUS D: 03/04/2023 2:46:48 pm T: 03/04/2023 3:02:00 pm  JOB: [id number]/ [id number]

## 2023-03-04 NOTE — Transfer of Care (Signed)
Immediate Anesthesia Transfer of Care Note  Patient: Janice Arnold  Procedure(s) Performed: Procedure(s) (LRB): ABORTED XI ROBOTIC ASSISTED LAPAROSCOPIC HYSTERECTOMY AND SALPINGECTOMY (Bilateral)  Patient Location: PACU  Anesthesia Type: General  Level of Consciousness: awake, oriented, sedated and patient cooperative  Airway & Oxygen Therapy: Patient Spontanous Breathing and Patient connected to face mask oxygen  Post-op Assessment: Report given to PACU RN and Post -op Vital signs reviewed and stable  Post vital signs: Reviewed and stable  Complications: No apparent anesthesia complications Last Vitals:  Vitals Value Taken Time  BP 133/74 03/04/23 1515  Temp 36.8 C 03/04/23 1506  Pulse 80 03/04/23 1519  Resp 17 03/04/23 1519  SpO2 94 % 03/04/23 1519  Vitals shown include unfiled device data.  Last Pain:  Vitals:   03/04/23 1506  TempSrc:   PainSc: 8       Patients Stated Pain Goal: 9 (03/04/23 1218)  Complications: No notable events documented.

## 2023-03-04 NOTE — Op Note (Signed)
03/04/2023  2:41 PM  PATIENT:  Janice Arnold  42 y.o. female  PRE-OPERATIVE DIAGNOSIS:  Abnormal Uterine Bleeding  POST-OPERATIVE DIAGNOSIS:  Abnormal Uterine Bleeding  PROCEDURE:  Procedure(s): Failed laparoscopic entry- closed and open attempts  SURGEON:  Surgeon(s): Olivia Mackie, MD  ASSISTANTS: RNFA   ANESTHESIA:   local and general  ESTIMATED BLOOD LOSS: minimal  DRAINS: Urinary Catheter (Foley)   LOCAL MEDICATIONS USED:  MARCAINE     SPECIMEN:  No Specimen  DISPOSITION OF SPECIMEN:  N/A  COUNTS:  YES  DICTATION #: [id number]  PLAN OF CARE: dc home and referral  PATIENT DISPOSITION:  PACU - hemodynamically stable.

## 2023-03-04 NOTE — Anesthesia Postprocedure Evaluation (Signed)
Anesthesia Post Note  Patient: SHAQUETA CASADY  Procedure(s) Performed: Jossie Ng ROBOTIC ASSISTED LAPAROSCOPIC HYSTERECTOMY AND SALPINGECTOMY (Bilateral)     Patient location during evaluation: PACU Anesthesia Type: General Level of consciousness: awake and alert Pain management: pain level controlled Vital Signs Assessment: post-procedure vital signs reviewed and stable Respiratory status: spontaneous breathing, nonlabored ventilation, respiratory function stable and patient connected to nasal cannula oxygen Cardiovascular status: blood pressure returned to baseline and stable Postop Assessment: no apparent nausea or vomiting Anesthetic complications: no   No notable events documented.  Last Vitals:  Vitals:   03/04/23 1506 03/04/23 1515  BP: (!) 142/97 133/74  Pulse: 94 89  Resp: (!) 22 18  Temp: 36.8 C   SpO2: 96% 95%    Last Pain:  Vitals:   03/04/23 1506  TempSrc:   PainSc: 8                  Trevor Iha

## 2023-03-04 NOTE — Discharge Instructions (Addendum)
DISCHARGE INSTRUCTIONS: Laparoscopy  The following instructions have been prepared to help you care for yourself upon your return home today.  Wound care:  Do not get the incision wet for the first 24 hours. The incision should be kept clean and dry.  The Band-Aids or dressings may be removed the day after surgery.  Should the incision become sore, red, and swollen after the first week, check with your doctor.  Personal hygiene:  Shower the day after your procedure. Use a sanitary pad or panty liner only until cleared by your physician. Nothing in the vagina, including tampons, douching, or intercourse.  Activity and limitations:  Do NOT drive or operate any equipment today.  Do NOT lift anything more than 15 pounds for 2-3 weeks after surgery.  Do NOT rest in bed all day.  Walking is encouraged. Walk each day, starting slowly with 5-minute walks 3 or 4 times a day. Slowly increase the length of your walks.  Walk up and down stairs slowly.  Do NOT do strenuous activities, such as golfing, playing tennis, bowling, running, biking, weight lifting, gardening, mowing, or vacuuming for 2-4 weeks. Ask your doctor when it is okay to start.  Diet: Eat a light meal as desired this evening. You may resume your usual diet tomorrow.  Return to work: This is dependent on the type of work you do. For the most part you can return to a desk job within a week of surgery. If you are more active at work, please discuss this with your doctor.  What to expect after your surgery: You may have a slight burning sensation when you urinate on the first day. You may have a very small amount of blood in the urine. Expect to have a small amount of vaginal discharge/light bleeding for 1-2 weeks. It is not unusual to have abdominal soreness and bruising for up to 2 weeks. You may be tired and need more rest for about 1 week. You may experience shoulder pain for 24-72 hours. Lying flat in bed may relieve it.  Call your  doctor for any of the following:  Develop a fever of 100.4 or greater  Inability to urinate 6 hours after discharge from hospital  Severe pain not relieved by pain medications  Persistent of heavy bleeding at incision site  Redness or swelling around incision site after a week  Increasing nausea or vomiting    Post Anesthesia Home Care Instructions  Activity: Get plenty of rest for the remainder of the day. A responsible individual must stay with you for 24 hours following the procedure.  For the next 24 hours, DO NOT: -Drive a car -Advertising copywriter -Drink alcoholic beverages -Take any medication unless instructed by your physician -Make any legal decisions or sign important papers.  Meals: Start with liquid foods such as gelatin or soup. Progress to regular foods as tolerated. Avoid greasy, spicy, heavy foods. If nausea and/or vomiting occur, drink only clear liquids until the nausea and/or vomiting subsides. Call your physician if vomiting continues.  Special Instructions/Symptoms: Your throat may feel dry or sore from the anesthesia or the breathing tube placed in your throat during surgery. If this causes discomfort, gargle with warm salt water. The discomfort should disappear within 24 hours.  If you had a scopolamine patch placed behind your ear for the management of post- operative nausea and/or vomiting:  1. The medication in the patch is effective for 72 hours, after which it should be removed.  Wrap patch in  a tissue and discard in the trash. Wash hands thoroughly with soap and water. 2. You may remove the patch earlier than 72 hours if you experience unpleasant side effects which may include dry mouth, dizziness or visual disturbances. 3. Avoid touching the patch. Wash your hands with soap and water after contact with the patch.

## 2023-03-05 ENCOUNTER — Other Ambulatory Visit: Payer: Self-pay | Admitting: Family Medicine

## 2023-03-05 ENCOUNTER — Encounter: Payer: Self-pay | Admitting: Family

## 2023-03-05 ENCOUNTER — Encounter (HOSPITAL_BASED_OUTPATIENT_CLINIC_OR_DEPARTMENT_OTHER): Payer: Self-pay | Admitting: Obstetrics and Gynecology

## 2023-03-05 MED ORDER — TRIAMCINOLONE ACETONIDE 0.1 % EX CREA: 1.0000 | TOPICAL_CREAM | Freq: Two times a day (BID) | CUTANEOUS | 0 refills | Status: DC

## 2023-03-09 ENCOUNTER — Other Ambulatory Visit: Payer: Self-pay | Admitting: Orthopedic Surgery

## 2023-03-19 ENCOUNTER — Other Ambulatory Visit: Payer: Self-pay | Admitting: Family Medicine

## 2023-03-19 NOTE — Progress Notes (Deleted)
Office Visit Note  Patient: Janice Arnold             Date of Birth: 03-Jun-1981           MRN: [id number]             PCP: Jeani Sow, MD Referring: Jeani Sow, MD Visit Date: 04/02/2023 Occupation: @GUAROCC @  Subjective:  No chief complaint on file.   History of Present Illness: Janice Arnold is a 42 y.o. female ***     Activities of Daily Living:  Patient reports morning stiffness for *** {minute/hour:19697}.   Patient {ACTIONS;DENIES/REPORTS:21021675::"Denies"} nocturnal pain.  Difficulty dressing/grooming: {ACTIONS;DENIES/REPORTS:21021675::"Denies"} Difficulty climbing stairs: {ACTIONS;DENIES/REPORTS:21021675::"Denies"} Difficulty getting out of chair: {ACTIONS;DENIES/REPORTS:21021675::"Denies"} Difficulty using hands for taps, buttons, cutlery, and/or writing: {ACTIONS;DENIES/REPORTS:21021675::"Denies"}  No Rheumatology ROS completed.   PMFS History:  Patient Active Problem List   Diagnosis Date Noted   Abnormal uterine bleeding 03/04/2023   B12 deficiency 02/13/2023   Depression 06/09/2022   Seizure (HCC) 06/06/2022   Alcohol dependence (HCC) 06/06/2022   Intractable nausea and vomiting 05/29/2022   Hypokalemia 05/29/2022   Hypomagnesemia 05/29/2022   QT prolongation 05/29/2022   AKI (acute kidney injury) (HCC) 05/29/2022   Hypochloremia 05/29/2022   Vaginal bleeding 05/29/2022   HTN (hypertension) 05/29/2022   GERD (gastroesophageal reflux disease) 05/29/2022   Abnormal LFTs (liver function tests) 05/29/2022   Local edema 05/09/2022   Adjustment disorder with mixed anxiety and depressed mood 05/09/2022   Iron deficiency anemia due to chronic blood loss 02/11/2022   Vitamin D deficiency 10/07/2019   Transaminitis 10/07/2019   IUD (intrauterine device) in place 10/06/2019    Past Medical History:  Diagnosis Date   Alcohol abuse    history of heavy alcohol abuse, last drank alcohol in 06/2022   Anemia 2023   s/p iron infusions   Anxiety     Follows with PCP. Dr. Dewitt Rota.   Compression fracture of body of thoracic vertebra (HCC) 11/05/2022   T4, T5, T6 / Follows w/ orthopedic, Dr. Willia Craze. Fractures occurred while patient was having a seizure, per patient.   Cyclic vomiting syndrome    hospital admission on 05/28/22 for vomiting, follows with gastroenterologist Dr. Tiajuana Amass.   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 Tiajuana Amass.   GERD (gastroesophageal reflux disease)    takes omeprazole daily   Heart murmur    since childhood   Hyperlipidemia    11/20/22 lipid panel in Epic   Hypokalemia 10/15/2022   K+ level 2.5   Menorrhagia    Palpitations    Follows w/ cardiology, Anice Paganini, NP. 11/14/2022 echocardiogram EF 60 65%, 12/31/22 Event heart monitor results in Epic.   PONV (postoperative nausea and vomiting)    Prolonged QT interval    Follows w/ cardiology, Bernadene Person, NP.   Seizure (HCC) 06/06/2022   Follows with neurologist, Dr. Leim Fabry @ [facility]. Seizures were thought to be related to stopping alcohol. However, patient has had seizures months after quitting alcohol. As of 02/06/23, last seizure was on 11/07/22. Keppra was then increased to bid.   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 See 11/05/22 Head CT and MRI in Epic. Resolved per pt on 02/06/23.   Tremors of nervous system    whole  body tremors, follows with Dr. Leim Fabry @ [facility].   Vitamin deficiency 2023   B12 deficiency, receiving monthly B12 injections / Vitamin D deficiency, taking supplement every two weeks   Wears contact lenses    Wears glasses     Family History  Problem Relation Age of Onset   Breast cancer Mother    Cervical cancer Mother        ovarian   Bone cancer Mother    Breast cancer  Maternal Grandmother    Colon cancer Maternal Great-grandmother 72   Esophageal cancer Neg Hx    Stomach cancer Neg Hx    Seizures Neg Hx    Stroke Neg Hx    Past Surgical History:  Procedure Laterality Date   COLONOSCOPY WITH ESOPHAGOGASTRODUODENOSCOPY (EGD)  03/27/2022   one rectal polyp   LAPAROSCOPIC APPENDECTOMY N/A 03/02/2022   Procedure: APPENDECTOMY LAPAROSCOPIC;  Surgeon: Manus Rudd, MD;  Location: [facility];  Service: General;  Laterality: N/A;   ROBOTIC ASSISTED LAPAROSCOPIC HYSTERECTOMY AND SALPINGECTOMY Bilateral 03/04/2023   Procedure: ABORTED XI ROBOTIC ASSISTED LAPAROSCOPIC HYSTERECTOMY AND SALPINGECTOMY;  Surgeon: Olivia Mackie, MD;  Location: [facility] [place];  Service: Gynecology;  Laterality: Bilateral;  Requests 2/1/2 hrs.   Social History   Social History Narrative   Project mgr   Immunization History  Administered Date(s) Administered   Hepatitis B, ADULT 03/04/2022, 04/11/2022   Influenza Inj Mdck Quad Pf 05/08/2017   Influenza,inj,Quad PF,6+ Mos 05/29/2018, 10/06/2019, 05/16/2022   Moderna Sars-Covid-2 Vaccination 11/04/2019, 12/07/2019   Tdap 06/12/2010, 02/26/2020     Objective: Vital Signs: There were no vitals taken for this visit.   Physical Exam   Musculoskeletal Exam: ***  CDAI Exam: CDAI Score: -- Patient Global: --; Provider Global: -- Swollen: --; Tender: -- Joint Exam 04/02/2023   No joint exam has been documented for this visit   There is currently no information documented on the homunculus. Go to the Rheumatology activity and complete the homunculus joint exam.  Investigation: No additional findings.  Imaging: No results found.  Recent Labs: Lab Results  Component Value Date   WBC 11.7 (H) 02/24/2023   HGB 13.7 02/24/2023   PLT 238 02/24/2023   NA 138 02/24/2023   K 3.2 (L) 02/24/2023   CL 98 02/24/2023   CO2 25 02/24/2023   GLUCOSE 100 (H) 02/24/2023   BUN 11 02/24/2023   CREATININE 1.06 (H)  02/24/2023   BILITOT 0.6 01/07/2023   ALKPHOS 35 (L) 01/07/2023   AST 31 01/07/2023   ALT 15 01/07/2023   PROT 6.7 01/07/2023   ALBUMIN 4.1 01/07/2023   CALCIUM 9.2 02/24/2023   October 17, 2022 ANA 1: 80 cytoplasmic, CK 56, cortisol 0.3, GGT 283, LDL 153, triglycerides 159  Speciality Comments: No specialty comments available.  Procedures:  No procedures performed Allergies: Patient has no known allergies.   Assessment / Plan:     Visit Diagnoses: No diagnosis found.  Orders: No orders of the defined types were placed in this encounter.  No orders of the defined types were placed in this encounter.   Face-to-face time spent with patient was *** minutes. Greater than 50% of time was spent in counseling and coordination of care.  Follow-Up Instructions: No follow-ups on file.   Pollyann Savoy, MD  Note - This record has been created using Animal nutritionist.  Chart creation errors have been sought, but may not always  have been located. Such creation errors do not reflect on  the standard  of medical care.

## 2023-03-22 ENCOUNTER — Other Ambulatory Visit: Payer: Self-pay | Admitting: Family Medicine

## 2023-03-26 ENCOUNTER — Ambulatory Visit: Payer: Managed Care, Other (non HMO) | Admitting: Sports Medicine

## 2023-03-27 ENCOUNTER — Other Ambulatory Visit: Payer: Self-pay | Admitting: Family Medicine

## 2023-03-27 ENCOUNTER — Encounter (INDEPENDENT_AMBULATORY_CARE_PROVIDER_SITE_OTHER): Payer: Self-pay

## 2023-03-27 ENCOUNTER — Telehealth: Payer: Self-pay | Admitting: Neurology

## 2023-03-27 NOTE — Telephone Encounter (Signed)
LVM and sent mychart msg informing pt of need to reschedule 07/21/23 appt - MD out

## 2023-03-29 ENCOUNTER — Other Ambulatory Visit: Payer: Self-pay | Admitting: Family Medicine

## 2023-03-29 ENCOUNTER — Other Ambulatory Visit: Payer: Self-pay | Admitting: Orthopedic Surgery

## 2023-04-02 ENCOUNTER — Encounter: Payer: Managed Care, Other (non HMO) | Admitting: Rheumatology

## 2023-04-02 DIAGNOSIS — K219 Gastro-esophageal reflux disease without esophagitis: Secondary | ICD-10-CM

## 2023-04-02 DIAGNOSIS — R768 Other specified abnormal immunological findings in serum: Secondary | ICD-10-CM

## 2023-04-02 DIAGNOSIS — E559 Vitamin D deficiency, unspecified: Secondary | ICD-10-CM

## 2023-04-02 DIAGNOSIS — R569 Unspecified convulsions: Secondary | ICD-10-CM

## 2023-04-02 DIAGNOSIS — E876 Hypokalemia: Secondary | ICD-10-CM

## 2023-04-02 DIAGNOSIS — K760 Fatty (change of) liver, not elsewhere classified: Secondary | ICD-10-CM

## 2023-04-02 DIAGNOSIS — R9431 Abnormal electrocardiogram [ECG] [EKG]: Secondary | ICD-10-CM

## 2023-04-02 DIAGNOSIS — Z975 Presence of (intrauterine) contraceptive device: Secondary | ICD-10-CM

## 2023-04-02 DIAGNOSIS — R7401 Elevation of levels of liver transaminase levels: Secondary | ICD-10-CM

## 2023-04-02 DIAGNOSIS — D500 Iron deficiency anemia secondary to blood loss (chronic): Secondary | ICD-10-CM

## 2023-04-02 DIAGNOSIS — F1029 Alcohol dependence with unspecified alcohol-induced disorder: Secondary | ICD-10-CM

## 2023-04-02 DIAGNOSIS — E538 Deficiency of other specified B group vitamins: Secondary | ICD-10-CM

## 2023-04-02 DIAGNOSIS — I10 Essential (primary) hypertension: Secondary | ICD-10-CM

## 2023-04-02 DIAGNOSIS — F4323 Adjustment disorder with mixed anxiety and depressed mood: Secondary | ICD-10-CM

## 2023-04-15 ENCOUNTER — Ambulatory Visit: Payer: Managed Care, Other (non HMO) | Admitting: Family Medicine

## 2023-04-23 ENCOUNTER — Ambulatory Visit: Payer: Managed Care, Other (non HMO) | Admitting: Rheumatology

## 2023-04-25 ENCOUNTER — Other Ambulatory Visit: Payer: Self-pay | Admitting: Orthopedic Surgery

## 2023-05-19 ENCOUNTER — Telehealth: Payer: Self-pay

## 2023-05-19 NOTE — Telephone Encounter (Signed)
Received paperwork from [facility] for long term disability for patient.  This patient hasn't been seen in our office in over a year. Unable to fill out paperwork for long term disability. Attempted to call patient to inform her; no answer. VM left for patient to call back.

## 2023-05-19 NOTE — Telephone Encounter (Signed)
This forms nurse connected with [employer] [phone]) Representative Maxine Glenn regarding LTD claim: [id number].  Asked if this form is needed for SS-A.  [facility] last saw this patient 04/19/2022.  No current appointments or treatment plan.  Patient has been a no show along with no long-term disability necessitated for iron deficiency anemia.  Maxine Glenn reports "provider was  listed as a provider seen for long-term disability.  Will note this information with the claim.  No further information needed from [employer]".    Claim completed as cancelled.  No further questions or  needs for this nurse or [employer].

## 2023-05-21 ENCOUNTER — Other Ambulatory Visit: Payer: Self-pay | Admitting: Family Medicine

## 2023-05-21 ENCOUNTER — Other Ambulatory Visit: Payer: Self-pay | Admitting: Orthopedic Surgery

## 2023-05-29 ENCOUNTER — Encounter: Payer: Self-pay | Admitting: *Deleted

## 2023-06-26 ENCOUNTER — Other Ambulatory Visit: Payer: Self-pay | Admitting: Medical Oncology

## 2023-06-26 ENCOUNTER — Encounter: Payer: Self-pay | Admitting: Family

## 2023-06-26 ENCOUNTER — Inpatient Hospital Stay: Payer: Managed Care, Other (non HMO) | Attending: Hematology & Oncology

## 2023-06-26 ENCOUNTER — Telehealth: Payer: Self-pay

## 2023-06-26 ENCOUNTER — Inpatient Hospital Stay (HOSPITAL_BASED_OUTPATIENT_CLINIC_OR_DEPARTMENT_OTHER): Payer: Managed Care, Other (non HMO) | Admitting: Medical Oncology

## 2023-06-26 ENCOUNTER — Encounter: Payer: Self-pay | Admitting: Medical Oncology

## 2023-06-26 VITALS — BP 140/89 | HR 98 | Temp 98.8°F | Resp 18 | Wt 228.1 lb

## 2023-06-26 DIAGNOSIS — D509 Iron deficiency anemia, unspecified: Secondary | ICD-10-CM | POA: Diagnosis not present

## 2023-06-26 DIAGNOSIS — D500 Iron deficiency anemia secondary to blood loss (chronic): Secondary | ICD-10-CM

## 2023-06-26 DIAGNOSIS — E538 Deficiency of other specified B group vitamins: Secondary | ICD-10-CM | POA: Diagnosis not present

## 2023-06-26 DIAGNOSIS — E639 Nutritional deficiency, unspecified: Secondary | ICD-10-CM | POA: Diagnosis not present

## 2023-06-26 LAB — CBC WITH DIFFERENTIAL (CANCER CENTER ONLY)
Abs Immature Granulocytes: 0.04 10*3/uL (ref 0.00–0.07)
Basophils Absolute: 0.1 10*3/uL (ref 0.0–0.1)
Basophils Relative: 1 %
Eosinophils Absolute: 0.1 10*3/uL (ref 0.0–0.5)
Eosinophils Relative: 1 %
HCT: 34.4 % — ABNORMAL LOW (ref 36.0–46.0)
Hemoglobin: 11.5 g/dL — ABNORMAL LOW (ref 12.0–15.0)
Immature Granulocytes: 1 %
Lymphocytes Relative: 21 %
Lymphs Abs: 1.3 10*3/uL (ref 0.7–4.0)
MCH: 36.3 pg — ABNORMAL HIGH (ref 26.0–34.0)
MCHC: 33.4 g/dL (ref 30.0–36.0)
MCV: 108.5 fL — ABNORMAL HIGH (ref 80.0–100.0)
Monocytes Absolute: 0.7 10*3/uL (ref 0.1–1.0)
Monocytes Relative: 10 %
Neutro Abs: 4.2 10*3/uL (ref 1.7–7.7)
Neutrophils Relative %: 66 %
Platelet Count: 193 10*3/uL (ref 150–400)
RBC: 3.17 MIL/uL — ABNORMAL LOW (ref 3.87–5.11)
RDW: 14.1 % (ref 11.5–15.5)
WBC Count: 6.4 10*3/uL (ref 4.0–10.5)
nRBC: 0.3 % — ABNORMAL HIGH (ref 0.0–0.2)

## 2023-06-26 LAB — CMP (CANCER CENTER ONLY)
ALT: 22 U/L (ref 0–44)
AST: 80 U/L — ABNORMAL HIGH (ref 15–41)
Albumin: 4 g/dL (ref 3.5–5.0)
Alkaline Phosphatase: 74 U/L (ref 38–126)
Anion gap: 12 (ref 5–15)
BUN: 7 mg/dL (ref 6–20)
CO2: 30 mmol/L (ref 22–32)
Calcium: 8.7 mg/dL — ABNORMAL LOW (ref 8.9–10.3)
Chloride: 97 mmol/L — ABNORMAL LOW (ref 98–111)
Creatinine: 0.66 mg/dL (ref 0.44–1.00)
GFR, Estimated: >60 mL/min (ref >60)
Glucose, Bld: 127 mg/dL — ABNORMAL HIGH (ref 70–99)
Potassium: 2.9 mmol/L — ABNORMAL LOW (ref 3.5–5.1)
Sodium: 139 mmol/L (ref 135–145)
Total Bilirubin: 2.9 mg/dL — ABNORMAL HIGH (ref <1.2)
Total Protein: 6.8 g/dL (ref 6.5–8.1)

## 2023-06-26 LAB — IRON AND IRON BINDING CAPACITY (CC-WL,HP ONLY)
Iron: 121 ug/dL (ref 28–170)
Saturation Ratios: 46 % — ABNORMAL HIGH (ref 10.4–31.8)
TIBC: 262 ug/dL (ref 250–450)
UIBC: 141 ug/dL — ABNORMAL LOW (ref 148–442)

## 2023-06-26 LAB — VITAMIN B12: Vitamin B-12: 402 pg/mL (ref 180–914)

## 2023-06-26 LAB — FERRITIN: Ferritin: 81 ng/mL (ref 11–307)

## 2023-06-26 NOTE — Telephone Encounter (Signed)
-----   Message from Rushie Chestnut sent at 06/26/2023 12:52 PM EST ----- Her potassium is too low. Her liver enzymes are up. I want her to go get checked out in the ER if she is having any abdominal pain. If not she needs to increase her potassium intake (I can send in a supplement if needed) and oral intake and we need to see her and recheck labs on Monday(APP visit plus labs) ----- Message ----- From: Leory Plowman, Lab In [place] Sent: 06/26/2023  10:12 AM EST To: Rushie Chestnut, PA-C

## 2023-06-26 NOTE — Progress Notes (Signed)
Hematology and Oncology Follow Up Visit  Janice Arnold [id number] 1981/06/23 42 y.o. 06/26/2023   Principle Diagnosis:  Iron deficiency anemia  B 12 deficiency  Current Therapy:   IV iron as indicated - Venofer 300 mg last dose 05/16/2022 B 12 injections with PCP- overdue currently    Interim History:  Janice Arnold is here today for follow-up.   She reports that she has had fatigue, nausea, vomiting.  She is only eating every other day.  She has not noted any obvious blood loss. No petechiae.  No fever, chills, n/v, cough, rash, SOB, chest pain, palpitations or changes in bowel or bladder habits.  No swelling, numbness or tingling in her extremities at this time.  No falls or syncope reported.  Wt Readings from Last 3 Encounters:  06/26/23 228 lb 1.9 oz (103.5 kg)  03/04/23 230 lb 11.2 oz (104.6 kg)  02/24/23 223 lb 6 oz (101.3 kg)   ECOG Performance Status: 1 - Symptomatic but completely ambulatory  Medications:  Allergies as of 06/26/2023   No Known Allergies      Medication List        Accurate as of June 26, 2023 10:24 AM. If you have any questions, ask your nurse or doctor.          acetaminophen 500 MG tablet Commonly known as: TYLENOL Take 500 mg by mouth every 6 (six) hours as needed.   DULoxetine 60 MG capsule Commonly known as: Cymbalta Take 1 capsule (60 mg total) by mouth daily.   ezetimibe 10 MG tablet Commonly known as: ZETIA Take 1 tablet (10 mg total) by mouth daily.   hydrochlorothiazide 25 MG tablet Commonly known as: HYDRODIURIL TAKE 1 TABLET BY MOUTH DAILY AS NEEDED.   HYDROcodone-acetaminophen 5-325 MG tablet Commonly known as: NORCO/VICODIN Take 1 tablet by mouth every 6 (six) hours as needed for moderate pain.   hydrOXYzine 10 MG tablet Commonly known as: ATARAX TAKE 1 TABLET BY MOUTH EVERY 6 HOURS AS NEEDED   ibuprofen 100 MG tablet Commonly known as: ADVIL Take 200 mg by mouth every 6 (six) hours as needed for  fever.   Klor-Con M20 20 MEQ tablet Generic drug: potassium chloride SA TAKE 2 TABLETS (40 MEQ TOTAL) BY MOUTH 3 (THREE) TIMES DAILY.   levETIRAcetam 750 MG tablet Commonly known as: KEPPRA Take 750 mg by mouth 2 (two) times daily. 1 tablet twice a day   methocarbamol 500 MG tablet Commonly known as: ROBAXIN TAKE 1 TABLET BY MOUTH UP TO EVERY 8 HOURS AS NEEDED FOR PAIN/MUSCLE SPASMS   metoprolol succinate 25 MG 24 hr tablet Commonly known as: Toprol XL Take 1 tablet (25 mg total) by mouth daily.   norethindrone 5 MG tablet Commonly known as: AYGESTIN Take by mouth daily.   norgestimate-ethinyl estradiol 0.25-35 MG-MCG tablet Commonly known as: ORTHO-CYCLEN Take 1 tablet by mouth daily.   omeprazole 40 MG capsule Commonly known as: PRILOSEC Take 1 capsule (40 mg total) by mouth daily.   ondansetron 4 MG disintegrating tablet Commonly known as: ZOFRAN-ODT TAKE 1 TABLET BY MOUTH EVERY 8 HOURS AS NEEDED FOR NAUSEA AND VOMITING   oxyCODONE 5 MG immediate release tablet Commonly known as: Oxy IR/ROXICODONE Take 5 mg by mouth every 4 (four) hours as needed for severe pain. 1-2 tablets every 4 hrs as needed.   promethazine 25 MG tablet Commonly known as: PHENERGAN TAKE 1 TABLET (25 MG TOTAL) BY MOUTH DAILY.   spironolactone 25 MG tablet Commonly known as:  ALDACTONE Take 1 tablet (25 mg total) by mouth daily.   triamcinolone cream 0.1 % Commonly known as: KENALOG Apply 1 Application topically 2 (two) times daily. What changed: additional instructions   Vitamin D (Ergocalciferol) 1.25 MG (50000 UNIT) Caps capsule Commonly known as: DRISDOL TAKE 1 CAPSULE BY MOUTH EVERY 7 (SEVEN) DAYS. What changed: See the new instructions.        Allergies: No Known Allergies  Past Medical History, Surgical history, Social history, and Family History were reviewed and updated.  Review of Systems: All other 10 point review of systems is negative.   Physical Exam:  weight is  228 lb 1.9 oz (103.5 kg). Her oral temperature is 98.8 F (37.1 C). Her blood pressure is 140/89 (abnormal) and her pulse is 98. Her respiration is 18 and oxygen saturation is 100%.   Wt Readings from Last 3 Encounters:  06/26/23 228 lb 1.9 oz (103.5 kg)  03/04/23 230 lb 11.2 oz (104.6 kg)  02/24/23 223 lb 6 oz (101.3 kg)    Ocular: Sclerae unicteric, pupils equal, round and reactive to light Ear-nose-throat: Oropharynx clear, dentition fair Lymphatic: No cervical or supraclavicular adenopathy Lungs no rales or rhonchi, good excursion bilaterally Heart regular rate and rhythm, no murmur appreciated Abd soft, nontender, positive bowel sounds MSK no focal spinal tenderness, no joint edema Neuro: non-focal, well-oriented, appropriate affect   Lab Results  Component Value Date   WBC 6.4 06/26/2023   HGB 11.5 (L) 06/26/2023   HCT 34.4 (L) 06/26/2023   MCV 108.5 (H) 06/26/2023   PLT 193 06/26/2023   Lab Results  Component Value Date   FERRITIN 211.9 10/15/2022   IRON 144 10/15/2022   TIBC 240.8 (L) 10/15/2022   UIBC 291 06/24/2022   IRONPCTSAT 59.8 (H) 10/15/2022   Lab Results  Component Value Date   RETICCTPCT 1.6 04/19/2022   RBC 3.17 (L) 06/26/2023   No results found for: "KPAFRELGTCHN", "LAMBDASER", "KAPLAMBRATIO" Lab Results  Component Value Date   IGGSERUM 1,195 02/21/2022   No results found for: "TOTALPROTELP", "ALBUMINELP", "A1GS", "A2GS", "BETS", "BETA2SER", "GAMS", "MSPIKE", "SPEI"   Chemistry      Component Value Date/Time   NA 138 02/24/2023 1100   NA 137 07/16/2022 0939   K 3.2 (L) 02/24/2023 1100   CL 98 02/24/2023 1100   CO2 25 02/24/2023 1100   BUN 11 02/24/2023 1100   BUN 12 07/16/2022 0939   CREATININE 1.06 (H) 02/24/2023 1100   CREATININE 0.51 02/11/2022 1103      Component Value Date/Time   CALCIUM 9.2 02/24/2023 1100   ALKPHOS 35 (L) 01/07/2023 0819   AST 31 01/07/2023 0819   AST 66 (H) 02/11/2022 1103   ALT 15 01/07/2023 0819   ALT 30  02/11/2022 1103   BILITOT 0.6 01/07/2023 0819   BILITOT 0.7 11/20/2022 1152   BILITOT 1.4 (H) 02/11/2022 1103     Encounter Diagnoses  Name Primary?   Iron deficiency anemia due to chronic blood loss Yes   Vitamin B 12 deficiency    Poor diet     Impression and Plan: Janice Arnold is a very pleasant 42 yo caucasian female with significant anemia noted over the last month. She was found to be B 12 and iron deficiency.  Focus will be on seeing GI and getting proper nutritional intake per day. She will consider Boost, Ensure or meal replacement shakes which may sit better on her stomach  Iron studies are pending. We will replace if needed.  B  12 pending. She gets injections with PCP.  RTC 3 months APP, labs (CBC, CMP, iron, ferritin, B12, folate)  Rushie Chestnut, PA-C 11/14/202410:24 AM

## 2023-06-26 NOTE — Telephone Encounter (Signed)
Spoke with pt who states she is having abdominal pain intermittently that is sharp. Recommended ER visit- pt agreed. FYI!

## 2023-06-27 ENCOUNTER — Emergency Department (HOSPITAL_COMMUNITY)
Admission: EM | Admit: 2023-06-27 | Discharge: 2023-06-27 | Disposition: A | Discharge status: Home / Self Care | Payer: Medicaid Other | Attending: Emergency Medicine | Admitting: Emergency Medicine

## 2023-06-27 ENCOUNTER — Encounter (HOSPITAL_COMMUNITY): Payer: Self-pay | Admitting: Emergency Medicine

## 2023-06-27 ENCOUNTER — Other Ambulatory Visit: Payer: Self-pay

## 2023-06-27 DIAGNOSIS — N3001 Acute cystitis with hematuria: Secondary | ICD-10-CM

## 2023-06-27 DIAGNOSIS — E876 Hypokalemia: Secondary | ICD-10-CM

## 2023-06-27 DIAGNOSIS — R1110 Vomiting, unspecified: Secondary | ICD-10-CM | POA: Insufficient documentation

## 2023-06-27 DIAGNOSIS — R799 Abnormal finding of blood chemistry, unspecified: Secondary | ICD-10-CM | POA: Diagnosis present

## 2023-06-27 DIAGNOSIS — R300 Dysuria: Secondary | ICD-10-CM | POA: Insufficient documentation

## 2023-06-27 LAB — COMPREHENSIVE METABOLIC PANEL
ALT: 22 U/L (ref 0–44)
AST: 70 U/L — ABNORMAL HIGH (ref 15–41)
Albumin: 3.2 g/dL — ABNORMAL LOW (ref 3.5–5.0)
Alkaline Phosphatase: 65 U/L (ref 38–126)
Anion gap: 11 (ref 5–15)
BUN: 6 mg/dL (ref 6–20)
CO2: 25 mmol/L (ref 22–32)
Calcium: 7.9 mg/dL — ABNORMAL LOW (ref 8.9–10.3)
Chloride: 99 mmol/L (ref 98–111)
Creatinine, Ser: 0.61 mg/dL (ref 0.44–1.00)
GFR, Estimated: >60 mL/min (ref >60)
Glucose, Bld: 108 mg/dL — ABNORMAL HIGH (ref 70–99)
Potassium: 2.6 mmol/L — CL (ref 3.5–5.1)
Sodium: 135 mmol/L (ref 135–145)
Total Bilirubin: 2.6 mg/dL — ABNORMAL HIGH (ref <1.2)
Total Protein: 6.6 g/dL (ref 6.5–8.1)

## 2023-06-27 LAB — CBC
HCT: 33.6 % — ABNORMAL LOW (ref 36.0–46.0)
Hemoglobin: 11.2 g/dL — ABNORMAL LOW (ref 12.0–15.0)
MCH: 36.5 pg — ABNORMAL HIGH (ref 26.0–34.0)
MCHC: 33.3 g/dL (ref 30.0–36.0)
MCV: 109.4 fL — ABNORMAL HIGH (ref 80.0–100.0)
Platelets: 194 10*3/uL (ref 150–400)
RBC: 3.07 MIL/uL — ABNORMAL LOW (ref 3.87–5.11)
RDW: 14.5 % (ref 11.5–15.5)
WBC: 6.5 10*3/uL (ref 4.0–10.5)
nRBC: 0.3 % — ABNORMAL HIGH (ref 0.0–0.2)

## 2023-06-27 LAB — URINALYSIS, ROUTINE W REFLEX MICROSCOPIC
Glucose, UA: NEGATIVE mg/dL
Ketones, ur: 5 mg/dL — AB
Nitrite: POSITIVE — AB
Protein, ur: 100 mg/dL — AB
Specific Gravity, Urine: 1.02 (ref 1.005–1.030)
pH: 6 (ref 5.0–8.0)

## 2023-06-27 LAB — LIPASE, BLOOD: Lipase: 29 U/L (ref 11–51)

## 2023-06-27 LAB — HCG, SERUM, QUALITATIVE: Preg, Serum: NEGATIVE

## 2023-06-27 MED ORDER — SODIUM CHLORIDE 0.9 % IV BOLUS
1000.0000 mL | Freq: Once | INTRAVENOUS | Status: AC
  Administered 2023-06-27: 1000 mL via INTRAVENOUS

## 2023-06-27 MED ORDER — POTASSIUM CHLORIDE CRYS ER 20 MEQ PO TBCR
40.0000 meq | EXTENDED_RELEASE_TABLET | Freq: Once | ORAL | Status: AC
  Administered 2023-06-27: 40 meq via ORAL
  Filled 2023-06-27: qty 2

## 2023-06-27 MED ORDER — HYDROXYZINE HCL 10 MG PO TABS
20.0000 mg | ORAL_TABLET | Freq: Once | ORAL | Status: AC
  Administered 2023-06-27: 20 mg via ORAL
  Filled 2023-06-27: qty 2

## 2023-06-27 MED ORDER — POTASSIUM CHLORIDE 10 MEQ/100ML IV SOLN
10.0000 meq | Freq: Once | INTRAVENOUS | Status: AC
  Administered 2023-06-27: 10 meq via INTRAVENOUS
  Filled 2023-06-27: qty 100

## 2023-06-27 MED ORDER — LEVETIRACETAM 500 MG PO TABS
750.0000 mg | ORAL_TABLET | Freq: Once | ORAL | Status: AC
  Administered 2023-06-27: 750 mg via ORAL
  Filled 2023-06-27: qty 1

## 2023-06-27 MED ORDER — METOCLOPRAMIDE HCL 10 MG PO TABS: 10.0000 mg | ORAL_TABLET | Freq: Three times a day (TID) | ORAL | 0 refills | Status: DC | PRN

## 2023-06-27 MED ORDER — CEPHALEXIN 500 MG PO CAPS: 500.0000 mg | ORAL_CAPSULE | Freq: Four times a day (QID) | ORAL | 0 refills | Status: DC

## 2023-06-27 MED ORDER — METHOCARBAMOL 500 MG PO TABS
500.0000 mg | ORAL_TABLET | Freq: Once | ORAL | Status: AC
  Administered 2023-06-27: 500 mg via ORAL
  Filled 2023-06-27: qty 1

## 2023-06-27 NOTE — ED Provider Notes (Addendum)
[place] [facility]MENT AT [facility] Provider Note   CSN: [id number] Arrival date & time: 06/27/23  5638     History  Chief Complaint  Patient presents with   Abdominal Pain   Abnormal Lab    K+    Janice Arnold is a 42 y.o. female.  Patient sent over here for low potassium.  Patient has a history of cyclic vomiting and hypokalemia..  Patient also has a history of EtOH abuse and fatty liver  The history is provided by the patient and medical records. No language interpreter was used.  Emesis Severity:  Mild Timing:  Intermittent Quality:  Bilious material Able to tolerate:  Liquids Progression:  Unchanged Chronicity:  New Recent urination:  Increased Relieved by:  Nothing Worsened by:  Nothing Ineffective treatments:  None tried Associated symptoms: no abdominal pain, no cough, no diarrhea and no headaches   Risk factors: no alcohol use        Home Medications Prior to Admission medications   Medication Sig Start Date End Date Taking? Authorizing Provider  metoCLOPramide (REGLAN) 10 MG tablet Take 1 tablet (10 mg total) by mouth every 8 (eight) hours as needed for nausea. 06/27/23  Yes Bethann Berkshire, MD  acetaminophen (TYLENOL) 500 MG tablet Take 500 mg by mouth every 6 (six) hours as needed.    [provider]  DULoxetine (CYMBALTA) 60 MG capsule Take 1 capsule (60 mg total) by mouth daily. 02/12/23   Jeani Sow, MD  ezetimibe (ZETIA) 10 MG tablet Take 1 tablet (10 mg total) by mouth daily. 10/21/22 10/16/23  Lennette Bihari, MD  hydrochlorothiazide (HYDRODIURIL) 25 MG tablet TAKE 1 TABLET BY MOUTH DAILY AS NEEDED. 03/27/23   Jeani Sow, MD  HYDROcodone-acetaminophen (NORCO/VICODIN) 5-325 MG tablet Take 1 tablet by mouth every 6 (six) hours as needed for moderate pain. 03/04/23   Olivia Mackie, MD  hydrOXYzine (ATARAX) 10 MG tablet TAKE 1 TABLET BY MOUTH EVERY 6 HOURS AS NEEDED 03/30/23   Jeani Sow, MD  ibuprofen (ADVIL)  100 MG tablet Take 200 mg by mouth every 6 (six) hours as needed for fever.    [provider]  KLOR-CON M20 20 MEQ tablet TAKE 2 TABLETS (40 MEQ TOTAL) BY MOUTH 3 (THREE) TIMES DAILY. 01/13/23   Jeani Sow, MD  levETIRAcetam (KEPPRA) 750 MG tablet Take 750 mg by mouth 2 (two) times daily. 1 tablet twice a day    [provider]  methocarbamol (ROBAXIN) 500 MG tablet TAKE 1 TABLET BY MOUTH UP TO EVERY 8 HOURS AS NEEDED FOR PAIN/MUSCLE SPASMS 05/21/23   London Sheer, MD  metoprolol succinate (TOPROL XL) 25 MG 24 hr tablet Take 1 tablet (25 mg total) by mouth daily. 07/15/22   Lennette Bihari, MD  norethindrone (AYGESTIN) 5 MG tablet Take by mouth daily.    [provider]  norgestimate-ethinyl estradiol (ORTHO-CYCLEN) 0.25-35 MG-MCG tablet Take 1 tablet by mouth daily. 12/12/22   [provider]  omeprazole (PRILOSEC) 40 MG capsule Take 1 capsule (40 mg total) by mouth daily. 10/15/22   Jeani Sow, MD  ondansetron (ZOFRAN-ODT) 4 MG disintegrating tablet TAKE 1 TABLET BY MOUTH EVERY 8 HOURS AS NEEDED FOR NAUSEA AND VOMITING 05/21/23   Jeani Sow, MD  oxyCODONE (OXY IR/ROXICODONE) 5 MG immediate release tablet Take 5 mg by mouth every 4 (four) hours as needed for severe pain. 1-2 tablets every 4 hrs as needed.    [provider]  promethazine (PHENERGAN) 25 MG tablet TAKE 1 TABLET (25 MG TOTAL) BY MOUTH DAILY. 05/21/23   Jeani Sow, MD  spironolactone (ALDACTONE) 25 MG tablet Take 1 tablet (25 mg total) by mouth daily. 12/23/22   Jeani Sow, MD  triamcinolone cream (KENALOG) 0.1 % Apply 1 Application topically 2 (two) times daily. Patient taking differently: Apply 1 Application topically 2 (two) times daily. As needed 03/05/23   Jeani Sow, MD  Vitamin D, Ergocalciferol, (DRISDOL) 1.25 MG (50000 UNIT) CAPS capsule TAKE 1 CAPSULE BY MOUTH EVERY 7 (SEVEN) DAYS. Patient taking differently: Takes every 2 weeks. 01/20/23   Jeani Sow, MD      Allergies    Patient has no known allergies.    Review of Systems   Review of Systems  Constitutional:  Negative for appetite change and fatigue.  HENT:  Negative for congestion, ear discharge and sinus pressure.   Eyes:  Negative for discharge.  Respiratory:  Negative for cough.   Cardiovascular:  Negative for chest pain.  Gastrointestinal:  Positive for vomiting. Negative for abdominal pain and diarrhea.  Genitourinary:  Negative for frequency and hematuria.  Musculoskeletal:  Negative for back pain.  Skin:  Negative for rash.  Neurological:  Negative for seizures and headaches.  Psychiatric/Behavioral:  Negative for hallucinations.     Physical Exam Updated Vital Signs BP 110/78   Pulse 82   Temp 98.8 F (37.1 C) (Oral)   Resp 18   Ht 5\' 7"  (1.702 m)   Wt 103.5 kg   SpO2 100%   BMI 35.73 kg/m  Physical Exam Vitals and nursing note reviewed.  Constitutional:      Appearance: She is well-developed.  HENT:     Head: Normocephalic.     Nose: Nose normal.  Eyes:     General: No scleral icterus.    Conjunctiva/sclera: Conjunctivae normal.  Neck:     Thyroid: No thyromegaly.  Cardiovascular:     Rate and Rhythm: Normal rate and regular rhythm.     Heart sounds: No murmur heard.    No friction rub. No gallop.  Pulmonary:     Breath sounds: No stridor. No wheezing or rales.  Chest:     Chest wall: No tenderness.  Abdominal:     General: There is no distension.     Tenderness: There is no abdominal tenderness. There is no rebound.  Musculoskeletal:        General: Normal range of motion.     Cervical back: Neck supple.  Lymphadenopathy:     Cervical: No cervical adenopathy.  Skin:    Findings: No erythema or rash.  Neurological:     Mental Status: She is alert and oriented to person, place, and time.     Motor: No abnormal muscle tone.     Coordination: Coordination normal.  Psychiatric:        Behavior: Behavior normal.     ED Results /  Procedures / Treatments   Labs (all labs ordered are listed, but only abnormal results are displayed) Labs Reviewed  COMPREHENSIVE METABOLIC PANEL - Abnormal; Notable for the following components:      Result Value   Potassium 2.6 (*)    Glucose, Bld 108 (*)    Calcium 7.9 (*)    Albumin 3.2 (*)    AST 70 (*)    Total Bilirubin 2.6 (*)    All other components within normal limits  CBC - Abnormal; Notable for  the following components:   RBC 3.07 (*)    Hemoglobin 11.2 (*)    HCT 33.6 (*)    MCV 109.4 (*)    MCH 36.5 (*)    nRBC 0.3 (*)    All other components within normal limits  URINALYSIS, ROUTINE W REFLEX MICROSCOPIC - Abnormal; Notable for the following components:   Color, Urine AMBER (*)    APPearance HAZY (*)    Hgb urine dipstick SMALL (*)    Bilirubin Urine MODERATE (*)    Ketones, ur 5 (*)    Protein, ur 100 (*)    Nitrite POSITIVE (*)    Leukocytes,Ua SMALL (*)    Bacteria, UA MANY (*)    All other components within normal limits  LIPASE, BLOOD  HCG, SERUM, QUALITATIVE    EKG None  Radiology No results found.  Procedures Procedures    Medications Ordered in ED Medications  potassium chloride 10 mEq in 100 mL IVPB (10 mEq Intravenous New Bag/Given 06/27/23 1457)  sodium chloride 0.9 % bolus 1,000 mL (1,000 mLs Intravenous New Bag/Given 06/27/23 1146)  potassium chloride 10 mEq in 100 mL IVPB (0 mEq Intravenous Stopped 06/27/23 1316)  potassium chloride 10 mEq in 100 mL IVPB (0 mEq Intravenous Stopped 06/27/23 1457)  potassium chloride SA (KLOR-CON M) CR tablet 40 mEq (40 mEq Oral Given 06/27/23 1312)  levETIRAcetam (KEPPRA) tablet 750 mg (750 mg Oral Given 06/27/23 1535)  methocarbamol (ROBAXIN) tablet 500 mg (500 mg Oral Given 06/27/23 1537)  hydrOXYzine (ATARAX) tablet 20 mg (20 mg Oral Given 06/27/23 1535)    ED Course/ Medical Decision Making/ A&P  CRITICAL CARE Performed by: Bethann Berkshire Total critical care time: 45 minutes Critical care  time was exclusive of separately billable procedures and treating other patients. Critical care was necessary to treat or prevent imminent or life-threatening deterioration. Critical care was time spent personally by me on the following activities: development of treatment plan with patient and/or surrogate as well as nursing, discussions with consultants, evaluation of patient's response to treatment, examination of patient, obtaining history from patient or surrogate, ordering and performing treatments and interventions, ordering and review of laboratory studies, ordering and review of radiographic studies, pulse oximetry and re-evaluation of patient's condition.  Patient with hypokalemia Click here for ABCD2, HEART and other calculatorsREFRESH Note before signing :1}                              Medical Decision Making Amount and/or Complexity of Data Reviewed Labs: ordered. ECG/medicine tests: ordered.  Risk Prescription drug management.  This patient presents to the ED for concern of vomiting, this involves an extensive number of treatment options, and is a complaint that carries with it a high risk of complications and morbidity.  The differential diagnosis includes gastritis,   Co morbidities that complicate the patient evaluation  Chronic vomiting   Additional history obtained:  Additional history obtained from patient External records from outside source obtained and reviewed including full records   Lab Tests:  I Ordered, and personally interpreted labs.  The pertinent results include: Urinalysis shows many bacteria and potassium 2.6   Imaging Studies ordered:  No imaging  Cardiac Monitoring: / EKG:  The patient was maintained on a cardiac monitor.  I personally viewed and interpreted the cardiac monitored which showed an underlying rhythm of: Normal sinus rhythm   Consultations Obtained: No consultant  Problem List / ED Course / Critical  interventions /  Medication management  Vomiting I ordered medication including potassium for hypokalemia Reevaluation of the patient after these medicines showed that the patient improved I have reviewed the patients home medicines and have made adjustments as needed   Social Determinants of Health:  None   Test / Admission - Considered:  None  Patient with hypokalemia and urinary tract infection.  She has had her potassium replaced with 3 runs of KCl 10 mEq and 40 p.o.  She is sent home with Keflex and will make sure she takes her potassium twice a day.  Patient will follow-up with PCP in 5 days        Final Clinical Impression(s) / ED Diagnoses Final diagnoses:  Hypokalemia    Rx / DC Orders ED Discharge Orders          Ordered    metoCLOPramide (REGLAN) 10 MG tablet  Every 8 hours PRN        06/27/23 1550              Bethann Berkshire, MD 06/27/23 1708    Bethann Berkshire, MD 06/27/23 1708    Bethann Berkshire, MD 06/27/23 1709

## 2023-06-27 NOTE — ED Notes (Signed)
Called lab to add on urine culture ?

## 2023-06-27 NOTE — ED Triage Notes (Signed)
Patient states she was sent for low potassium from labs drawn yesterday. Patient states she has hx of same and takes supplements for. Also c/o intermittent abdominal pain/cramping with N/V x 2 weeks. Patient states she has chronic emesis.

## 2023-06-27 NOTE — Discharge Instructions (Addendum)
Make sure you take your potassium twice a day at least.  And follow-up next week as planned with your doctor.  We have started you on antibiotics for possible urinary tract infection.  Your doctor can review the urine culture when you see him

## 2023-06-29 ENCOUNTER — Other Ambulatory Visit: Payer: Self-pay | Admitting: Family Medicine

## 2023-06-29 LAB — URINE CULTURE: Culture: >=100000 — AB

## 2023-06-30 ENCOUNTER — Telehealth (HOSPITAL_BASED_OUTPATIENT_CLINIC_OR_DEPARTMENT_OTHER): Payer: Self-pay | Admitting: *Deleted

## 2023-06-30 NOTE — Telephone Encounter (Signed)
Post ED Visit - Positive Culture Follow-up  Culture report reviewed by antimicrobial stewardship pharmacist: [facility] []  Enzo Bi, Pharm.D. []  Celedonio Miyamoto, [address].D., BCPS AQ-ID []  Garvin Fila, Pharm.D., BCPS []  Georgina Pillion, Pharm.D., BCPS []  [place], [place].D., BCPS, AAHIVP []  Estella Husk, Pharm.D., BCPS, AAHIVP []  Lysle Pearl, PharmD, BCPS []  Phillips Climes, PharmD, BCPS []  Agapito Games, PharmD, BCPS []  Verlan Friends, PharmD []  Mervyn Gay, PharmD, BCPS []  Vinnie Level, PharmD  [facility] [x]  Georgina Pillion, PharmD []  Greer Pickerel, PharmD []  Adalberto Cole, PharmD []  Perlie Gold, Rph []  Lonell Face) Jean Rosenthal, PharmD []  Earl Many, PharmD []  Junita Push, PharmD []  Dorna Leitz, PharmD []  Terrilee Files, PharmD []  Lynann Beaver, PharmD []  Keturah Barre, PharmD []  Loralee Pacas, PharmD []  Bernadene Person, PharmD   Positive urine culture Treated with cephalexin, organism sensitive to the same and no further patient follow-up is required at this time.  Bing Quarry 06/30/2023, 9:23 AM

## 2023-06-30 NOTE — Telephone Encounter (Signed)
Post ED Visit - Positive Culture Follow-up  Culture report reviewed by antimicrobial stewardship pharmacist: [facility] []  Enzo Bi, Pharm.D. []  Celedonio Miyamoto, Pharm.D., BCPS AQ-ID []  Garvin Fila, Pharm.D., BCPS []  Georgina Pillion, [address].D., BCPS []  [place], [address].D., BCPS, AAHIVP []  Estella Husk, Pharm.D., BCPS, AAHIVP []  Lysle Pearl, PharmD, BCPS []  Phillips Climes, PharmD, BCPS []  Agapito Games, PharmD, BCPS []  Verlan Friends, PharmD []  Mervyn Gay, PharmD, BCPS []  Vinnie Level, PharmD  [facility] [x]  Georgina Pillion PharmD []  Greer Pickerel, PharmD []  Adalberto Cole, PharmD []  Perlie Gold, Rph []  Lonell Face) Jean Rosenthal, PharmD []  Earl Many, PharmD []  Junita Push, PharmD []  Dorna Leitz, PharmD []  Terrilee Files, PharmD []  Lynann Beaver, PharmD []  Keturah Barre, PharmD []  Loralee Pacas, PharmD []  Bernadene Person, PharmD   Positive urine culture Treated with cephalexin, organism sensitive to the same and no further patient follow-up is required at this time.  Nena Polio Garner Nash 06/30/2023, 9:15 AM

## 2023-06-30 NOTE — Telephone Encounter (Signed)
Needs appt

## 2023-07-02 ENCOUNTER — Other Ambulatory Visit (INDEPENDENT_AMBULATORY_CARE_PROVIDER_SITE_OTHER): Payer: Self-pay

## 2023-07-02 ENCOUNTER — Encounter: Payer: Self-pay | Admitting: Family Medicine

## 2023-07-02 ENCOUNTER — Ambulatory Visit (INDEPENDENT_AMBULATORY_CARE_PROVIDER_SITE_OTHER): Payer: Self-pay | Admitting: Orthopedic Surgery

## 2023-07-02 ENCOUNTER — Ambulatory Visit: Payer: Self-pay | Admitting: Family Medicine

## 2023-07-02 VITALS — BP 117/82 | HR 101 | Temp 97.7°F | Resp 18 | Ht 67.0 in | Wt 225.1 lb

## 2023-07-02 DIAGNOSIS — E876 Hypokalemia: Secondary | ICD-10-CM | POA: Diagnosis not present

## 2023-07-02 DIAGNOSIS — K219 Gastro-esophageal reflux disease without esophagitis: Secondary | ICD-10-CM

## 2023-07-02 DIAGNOSIS — M545 Low back pain, unspecified: Secondary | ICD-10-CM

## 2023-07-02 DIAGNOSIS — I10 Essential (primary) hypertension: Secondary | ICD-10-CM

## 2023-07-02 DIAGNOSIS — E559 Vitamin D deficiency, unspecified: Secondary | ICD-10-CM | POA: Diagnosis not present

## 2023-07-02 DIAGNOSIS — M546 Pain in thoracic spine: Secondary | ICD-10-CM | POA: Diagnosis not present

## 2023-07-02 DIAGNOSIS — G8929 Other chronic pain: Secondary | ICD-10-CM

## 2023-07-02 DIAGNOSIS — R600 Localized edema: Secondary | ICD-10-CM

## 2023-07-02 DIAGNOSIS — Z23 Encounter for immunization: Secondary | ICD-10-CM | POA: Diagnosis not present

## 2023-07-02 DIAGNOSIS — R7989 Other specified abnormal findings of blood chemistry: Secondary | ICD-10-CM | POA: Diagnosis not present

## 2023-07-02 DIAGNOSIS — F4323 Adjustment disorder with mixed anxiety and depressed mood: Secondary | ICD-10-CM | POA: Diagnosis not present

## 2023-07-02 DIAGNOSIS — R7689 Other specified abnormal immunological findings in serum: Secondary | ICD-10-CM

## 2023-07-02 DIAGNOSIS — D509 Iron deficiency anemia, unspecified: Secondary | ICD-10-CM | POA: Diagnosis not present

## 2023-07-02 DIAGNOSIS — E538 Deficiency of other specified B group vitamins: Secondary | ICD-10-CM | POA: Diagnosis not present

## 2023-07-02 DIAGNOSIS — R768 Other specified abnormal immunological findings in serum: Secondary | ICD-10-CM

## 2023-07-02 LAB — COMPREHENSIVE METABOLIC PANEL
ALT: 26 U/L (ref 0–35)
AST: 105 U/L — ABNORMAL HIGH (ref 0–37)
Albumin: 3.7 g/dL (ref 3.5–5.2)
Alkaline Phosphatase: 92 U/L (ref 39–117)
BUN: 4 mg/dL — ABNORMAL LOW (ref 6–23)
CO2: 25 meq/L (ref 19–32)
Calcium: 8.8 mg/dL (ref 8.4–10.5)
Chloride: 101 meq/L (ref 96–112)
Creatinine, Ser: 0.47 mg/dL (ref 0.40–1.20)
GFR: 117.81 mL/min (ref >60.00)
Glucose, Bld: 84 mg/dL (ref 70–99)
Potassium: 3.2 meq/L — ABNORMAL LOW (ref 3.5–5.1)
Sodium: 140 meq/L (ref 135–145)
Total Bilirubin: 1.4 mg/dL — ABNORMAL HIGH (ref 0.2–1.2)
Total Protein: 6.6 g/dL (ref 6.0–8.3)

## 2023-07-02 LAB — TSH: TSH: 3.71 u[IU]/mL (ref 0.35–5.50)

## 2023-07-02 LAB — VITAMIN D 25 HYDROXY (VIT D DEFICIENCY, FRACTURES): VITD: 21.75 ng/mL — ABNORMAL LOW (ref 30.00–100.00)

## 2023-07-02 LAB — MAGNESIUM: Magnesium: 1.3 mg/dL — ABNORMAL LOW (ref 1.5–2.5)

## 2023-07-02 MED ORDER — HYDROXYZINE PAMOATE 25 MG PO CAPS: 25.0000 mg | ORAL_CAPSULE | Freq: Three times a day (TID) | ORAL | 0 refills | Status: DC | PRN

## 2023-07-02 MED ORDER — CYANOCOBALAMIN 1000 MCG/ML IJ SOLN
1000.0000 ug | Freq: Once | INTRAMUSCULAR | Status: AC
  Administered 2023-07-02: 1000 ug via INTRAMUSCULAR

## 2023-07-02 MED ORDER — METHYLPREDNISOLONE 4 MG PO TBPK: ORAL_TABLET | ORAL | 0 refills | Status: DC

## 2023-07-02 MED ORDER — OMEPRAZOLE 40 MG PO CPDR: 40.0000 mg | DELAYED_RELEASE_CAPSULE | Freq: Every day | ORAL | 1 refills | Status: DC

## 2023-07-02 MED ORDER — HYDROCHLOROTHIAZIDE 25 MG PO TABS: 25.0000 mg | ORAL_TABLET | Freq: Every day | ORAL | 0 refills | Status: DC | PRN

## 2023-07-02 NOTE — Assessment & Plan Note (Signed)
Chronic.  Pt was placed on ameloride d/t low K, but edema not controlled so pt restarted hydrochlorothiazide 25mg  daily.  On spironolactone as well.  Continue.  Monitor K closely

## 2023-07-02 NOTE — Assessment & Plan Note (Signed)
Chronic.  Controlled.  Has f/u w/Card.  Still a lot of palpitations-which may also be electrolytes

## 2023-07-02 NOTE — Patient Instructions (Signed)

## 2023-07-02 NOTE — Progress Notes (Signed)
Subjective:    Patient ID: Janice Arnold, female    DOB: [birth year], 42 y.o.   MRN: [id number]  Chief Complaint  Patient presents with   Follow-up    2 month follow-up Potassium was low, had to go to ER Had a vomiting spell last week   HPI Ed f/u and Chronic GI Issues - Presented to Ed on 11/15 complaining of abdominal pain and vomiting. Potassium 2.6. Total Bilirubin 2.6. Given fluids and potassium. Treated with Keflex for a UTI. ED diagnosis of hypokalemia. Today, she  states she has improved. Will be seeing GI in December. Is currently out of work.   GERD - Compliant with omeprazole 40 mg. Responding well. Reflux has improved.   Potassium- Only has 5 hydrochlorothiazide pills. Will reach out to pharmacy. Reports she takes a potassium- chloride pill and hydrochlorothiazide together. Not taking Vitamin D. Chronic hypokalemia-etiol unclear.  Has appt w/endo in Jan  Anxiety - Taking two (2) 10 mg Hydroxyzine BID. Responding well.   HTN /palpitations- Pt is on metoprolol 25 mg. Bp's running not checked. No ha/dizziness/edema/cough. Bp at today's visit was 117/82.still getting palpitations Edema-on spironolactone and hydrochlorothiazide.  Others not work.  But, potassium really low so taking that as well.   DUB-seeing gyn and [facility] +ANA-missed appt w/rheum.  Needs new referral  Health Maintenance Due  Topic Date Due   MAMMOGRAM  04/29/2020    Past Medical History:  Diagnosis Date   Alcohol abuse    history of heavy alcohol abuse, last drank alcohol in 06/2022   Anemia 2023   s/p iron infusions   Anxiety    Follows with PCP. Dr. Dewitt Rota.   Compression fracture of body of thoracic vertebra (HCC) 11/05/2022   T4, T5, T6 / Follows w/ orthopedic, Dr. Willia Craze. Fractures occurred while patient was having a seizure, per patient.   Cyclic vomiting syndrome    hospital admission on 05/28/22 for vomiting, follows with gastroenterologist Dr. Tiajuana Amass.   Edema    lower  extremity, taking amiloride & spironolactone, follows w/ PCP   Elevated LFTs 2023   Fatty liver due to alcoholism    Follows with gastroenterologist, Dr. Tiajuana Amass.   GERD (gastroesophageal reflux disease)    takes omeprazole daily   Heart murmur    since childhood   Hyperlipidemia    11/20/22 lipid panel in Epic   Hypokalemia 10/15/2022   K+ level 2.5   Menorrhagia    Palpitations    Follows w/ cardiology, Anice Paganini, NP. 11/14/2022 echocardiogram EF 60 65%, 12/31/22 Event heart monitor results in Epic.   PONV (postoperative nausea and vomiting)    Prolonged QT interval    Follows w/ cardiology, Bernadene Person, NP.   Seizure (HCC) 06/06/2022   Follows with neurologist, Dr. Leim Fabry @ [facility]. Seizures were thought to be related to stopping alcohol. However, patient has had seizures months after quitting alcohol. As of 02/06/23, last seizure was on 11/07/22. Keppra was then increased to bid.   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 See 11/05/22 Head CT and MRI in Epic. Resolved per pt on 02/06/23.   Tremors of nervous system    whole body tremors, follows with Dr. Leim Fabry @ [facility].   Vitamin deficiency 2023   B12 deficiency, receiving monthly B12 injections / Vitamin D deficiency, taking supplement every two weeks   Wears contact lenses  Wears glasses     Past Surgical History:  Procedure Laterality Date   COLONOSCOPY WITH ESOPHAGOGASTRODUODENOSCOPY (EGD)  03/27/2022   one rectal polyp   LAPAROSCOPIC APPENDECTOMY N/A 03/02/2022   Procedure: APPENDECTOMY LAPAROSCOPIC;  Surgeon: Manus Rudd, MD;  Location: [facility];  Service: General;  Laterality: N/A;   ROBOTIC ASSISTED LAPAROSCOPIC HYSTERECTOMY AND SALPINGECTOMY Bilateral 03/04/2023   Procedure: ABORTED XI ROBOTIC ASSISTED LAPAROSCOPIC HYSTERECTOMY AND SALPINGECTOMY;  Surgeon: Olivia Mackie, MD;  Location: [facility];  Service: Gynecology;  Laterality: Bilateral;  Requests 2/1/2 hrs.     Current Outpatient Medications:    acetaminophen (TYLENOL) 500 MG tablet, Take 500 mg by mouth every 6 (six) hours as needed., Disp: , Rfl:    cephALEXin (KEFLEX) 500 MG capsule, Take 1 capsule (500 mg total) by mouth 4 (four) times daily., Disp: 28 capsule, Rfl: 0   DULoxetine (CYMBALTA) 60 MG capsule, Take 1 capsule (60 mg total) by mouth daily., Disp: 90 capsule, Rfl: 1   ezetimibe (ZETIA) 10 MG tablet, Take 1 tablet (10 mg total) by mouth daily., Disp: 90 tablet, Rfl: 3   HYDROcodone-acetaminophen (NORCO/VICODIN) 5-325 MG tablet, Take 1 tablet by mouth every 6 (six) hours as needed for moderate pain., Disp: 15 tablet, Rfl: 0   hydrOXYzine (VISTARIL) 25 MG capsule, Take 1 capsule (25 mg total) by mouth every 8 (eight) hours as needed., Disp: 180 capsule, Rfl: 0   ibuprofen (ADVIL) 100 MG tablet, Take 200 mg by mouth every 6 (six) hours as needed for fever., Disp: , Rfl:    KLOR-CON M20 20 MEQ tablet, TAKE 2 TABLETS (40 MEQ TOTAL) BY MOUTH 3 (THREE) TIMES DAILY., Disp: 540 tablet, Rfl: 1   levETIRAcetam (KEPPRA) 750 MG tablet, Take 750 mg by mouth 2 (two) times daily. 1 tablet twice a day, Disp: , Rfl:    methocarbamol (ROBAXIN) 500 MG tablet, TAKE 1 TABLET BY MOUTH UP TO EVERY 8 HOURS AS NEEDED FOR PAIN/MUSCLE SPASMS, Disp: 70 tablet, Rfl: 0   metoCLOPramide (REGLAN) 10 MG tablet, Take 1 tablet (10 mg total) by mouth every 8 (eight) hours as needed for nausea., Disp: 30 tablet, Rfl: 0   metoprolol succinate (TOPROL XL) 25 MG 24 hr tablet, Take 1 tablet (25 mg total) by mouth daily., Disp: 90 tablet, Rfl: 3   norethindrone (AYGESTIN) 5 MG tablet, Take by mouth daily., Disp: , Rfl:    norgestimate-ethinyl estradiol (ORTHO-CYCLEN) 0.25-35 MG-MCG tablet, Take 1 tablet by mouth daily., Disp: , Rfl:    ondansetron (ZOFRAN-ODT) 4 MG disintegrating tablet, TAKE 1 TABLET BY MOUTH EVERY 8 HOURS AS NEEDED FOR NAUSEA AND  VOMITING, Disp: 20 tablet, Rfl: 1   oxyCODONE (OXY IR/ROXICODONE) 5 MG immediate release tablet, Take 5 mg by mouth every 4 (four) hours as needed for severe pain. 1-2 tablets every 4 hrs as needed., Disp: , Rfl:    promethazine (PHENERGAN) 25 MG tablet, TAKE 1 TABLET (25 MG TOTAL) BY MOUTH DAILY., Disp: 90 tablet, Rfl: 0   spironolactone (ALDACTONE) 25 MG tablet, Take 1 tablet (25 mg total) by mouth daily., Disp: 90 tablet, Rfl: 3   triamcinolone cream (KENALOG) 0.1 %, Apply 1 Application topically 2 (two) times daily. (Patient taking differently: Apply 1 Application topically 2 (two) times daily. As needed), Disp: 30 g, Rfl: 0   hydrochlorothiazide (HYDRODIURIL) 25 MG tablet, Take 1 tablet (25 mg total) by mouth daily as needed., Disp: 90 tablet, Rfl: 0   omeprazole (PRILOSEC) 40 MG capsule, Take 1 capsule (  40 mg total) by mouth daily., Disp: 90 capsule, Rfl: 1  No Known Allergies ROS neg/noncontributory except as noted HPI/below    Objective:     BP 117/82   Pulse (!) 101   Temp 97.7 F (36.5 C) (Temporal)   Resp 18   Ht 5\' 7"  (1.702 m)   Wt 225 lb 2 oz (102.1 kg)   SpO2 95%   BMI 35.26 kg/m  Wt Readings from Last 3 Encounters:  07/02/23 225 lb 2 oz (102.1 kg)  06/27/23 228 lb 1.9 oz (103.5 kg)  06/26/23 228 lb 1.9 oz (103.5 kg)    Physical Exam   Gen: WDWN NAD HEENT: NCAT, conjunctiva not injected, sclera nonicteric NECK:  supple, no thyromegaly, no nodes, no carotid bruits CARDIAC:tachy RRR, S1S2+, no murmur. DP 2+B +tachycardic LUNGS: CTAB. No wheezes ABDOMEN:  BS+, soft, NTND, No HSM, no masses +slightly tender right side abdomen.  EXT:  no edema MSK: no gross abnormalities.  NEURO: A&O x3.  CN II-XII intact.  PSYCH: normal mood. Good eye contact  Reviewed ER notes, upcoming appt, etc.  45 min     Assessment & Plan:  Hypokalemia Assessment & Plan: Chronic.  Etiol unclear.  May be from chronic, cyclic vomiting, hydrochlorothiazide(but degree of low K, more  profound).  Has appt w/endo in Jan-advised to keep it and not miss  Orders: -     Comprehensive metabolic panel -     TSH  Hypomagnesemia Assessment & Plan: Chronic.  Check labs  Orders: -     Magnesium  Abnormal LFTs (liver function tests) Assessment & Plan: Chronic.  ?fatty liver, cyclic vomiting.  Check labs.  Will see GI soon  Orders: -     Comprehensive metabolic panel  Iron deficiency anemia, unspecified iron deficiency anemia type Assessment & Plan: Chronic.  DUB.  Care per heme  Orders: -     Omeprazole; Take 1 capsule (40 mg total) by mouth daily.  Dispense: 90 capsule; Refill: 1  Primary hypertension Assessment & Plan: Chronic.  Controlled.  Has f/u w/Card.  Still a lot of palpitations-which may also be electrolytes  Orders: -     hydroCHLOROthiazide; Take 1 tablet (25 mg total) by mouth daily as needed.  Dispense: 90 tablet; Refill: 0  Gastroesophageal reflux disease without esophagitis Assessment & Plan: Chronic.  Controlled on omeprazole 40mg  daily   Vitamin D deficiency Assessment & Plan: Chronic.  No supps.  Check level  Orders: -     VITAMIN D 25 Hydroxy (Vit-D Deficiency, Fractures)  B12 deficiency Assessment & Plan: Chronic.  Multifactorial, poor diet, chronic PPI  Orders: -     Cyanocobalamin  Adjustment disorder with mixed anxiety and depressed mood Assessment & Plan: Chronic.  Mostly controlled.  Taking 20mg  hydroxyzine bid so will change to 25mg  bid  Orders: -     hydrOXYzine Pamoate; Take 1 capsule (25 mg total) by mouth every 8 (eight) hours as needed.  Dispense: 180 capsule; Refill: 0  ANA positive -     Ambulatory referral to Rheumatology  Need for influenza vaccination -     Flu vaccine trivalent PF, 6mos and older(Flulaval,Afluria,Fluarix,Fluzone)  Local edema Assessment & Plan: Chronic.  Pt was placed on ameloride d/t low K, but edema not controlled so pt restarted hydrochlorothiazide 25mg  daily.  On spironolactone as  well.  Continue.  Monitor K closely   Stressed importance of regular f/u and not missing appts.  Hard to get dx and tx plan  Return  in about 4 weeks (around 07/30/2023) for chronic follow-up. Melina Fiddler N Rice,acting as a scribe for Angelena Sole, MD.,have documented all relevant documentation on the behalf of Angelena Sole, MD,as directed by  Angelena Sole, MD while in the presence of Angelena Sole, MD.  Juleen Starr, have reviewed all documentation for this visit. The documentation on 07/02/23 for the exam, diagnosis, procedures, and orders are all accurate and complete.   Angelena Sole, MD

## 2023-07-02 NOTE — Assessment & Plan Note (Signed)
Chronic.  Multifactorial, poor diet, chronic PPI

## 2023-07-02 NOTE — Assessment & Plan Note (Signed)
Chronic.  Etiol unclear.  May be from chronic, cyclic vomiting, hydrochlorothiazide(but degree of low K, more profound).  Has appt w/endo in Jan-advised to keep it and not miss

## 2023-07-02 NOTE — Assessment & Plan Note (Signed)
Chronic. Check labs

## 2023-07-02 NOTE — Assessment & Plan Note (Signed)
Chronic.  DUB.  Care per heme

## 2023-07-02 NOTE — Assessment & Plan Note (Signed)
Chronic.  Controlled on omeprazole 40mg  daily

## 2023-07-02 NOTE — Progress Notes (Signed)
1.  Potassium still low-take 2 tabs 3x/day.  Repeat cmp on Monday 2.  Magnesium is low-get magnesium oxide over-the-counter 400 mg daily.  Recheck magnesium on Monday 3.  Vitamin D is a little low-send in vitamin D 50,000 IUs weekly.  #12/0

## 2023-07-02 NOTE — Assessment & Plan Note (Signed)
Chronic.  No supps.  Check level

## 2023-07-02 NOTE — Assessment & Plan Note (Signed)
Chronic.  ?fatty liver, cyclic vomiting.  Check labs.  Will see GI soon

## 2023-07-02 NOTE — Assessment & Plan Note (Signed)
Chronic.  Mostly controlled.  Taking 20mg  hydroxyzine bid so will change to 25mg  bid

## 2023-07-02 NOTE — Progress Notes (Signed)
Orthopedic Spine Surgery Office Note    Assessment: Patient is a 42 y.o. female with 2 issues:  1) thoracic back pain from compression fractures at T4, T5, T6 that has gradually improved but is still present (date of injury 11/05/2022, 8 months from injury) 2) acute onset of lumbar back pain with no radicular symptoms    Plan: -Patient has tried Robaxin -She is only taking Robaxin once per day, I told her that she can increase that to up to 4 times per day.  Prescribed a Medrol Dosepak for additional pain relief.  Told her she can take Tylenol up to 1000 mg/day.  For bad days, she can use NSAIDs but I told her to avoid the chronic use them as they can affect the kidneys and stomach.  She should not use the NSAIDs when taking the Medrol Dosepak. -Recommended that she try Salonpas over the tender area over her lower back -Referred her to PT as well -If she is not doing any better at her next visit, we will order an MRI of her lumbar spine to evaluate further -Patient should return to office in 6 weeks, x-rays at next visit: None     Patient expressed understanding of the plan and all questions were answered to the patient's satisfaction.    ___________________________________________________________________________   History: Patient is a 42 y.o. female who has been previously seen in the office for thoracic back pain.  She reports that her thoracic back pain has improved since she was last seen but she is now having significant lumbar back pain.  She feels it in the middle of her back in the mid lumbar region.  There is no trauma or injury that preceded the onset of this pain.  She does not have any pain rating into either lower extremity.  She has found Robaxin to be helpful.  She is taking it once per day.  She has not tried any other treatment so far for her lumbar back pain.  Due to her significant low back pain, she is still unable to work.     Previous treatments: Robaxin    Physical  Exam:   General: no acute distress, appears stated age Neurologic: alert, answering questions appropriately, following commands Respiratory: unlabored breathing on room air, symmetric chest rise Psychiatric: appropriate affect, normal cadence to speech     MSK (spine):   -Strength exam                                                   Left                  Right EHL                              5/5                  5/5 TA                                 5/5                  5/5 GSC  5/5                  5/5 Knee extension            5/5                  5/5 Hip flexion                    5/5                  5/5   -Sensory exam                           Sensation intact to light touch in L3-S1 nerve distributions of bilateral lower extremities   -Achilles DTR: 2/4 on the left, 2/4 on the right -Patellar tendon DTR: 2/4 on the left, 2/4 on the right   -Straight leg raise: Negative bilaterally -Clonus: no beats bilaterally   -Right hip exam: No pain through range of motion, negative Stinchfield, negative FABER, negative SI joint compression test, negative Gaenslen's -Left hip exam: No pain through range of motion, negative Stinchfield, negative FABER, negative SI joint compression test, negative Gaenslen's -TTP over the midline midlumbar spine and within about 4 inches on each side of midline in the area of the paraspinal muscles. 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  No change in height loss since prior films.  No other fracture seen.  No dislocation seen.  No significant degenerative changes seen.    CT of the thoracic spine from 12/12/2022 was previously independently reviewed and interpreted, showing wedge deformity with anterior height loss at T4, T5, T6.  Greatest height loss at T6 with irregularity to  the endplates, particular the superior endplate.  Vacuum disc phenomenon seen in T6/7 disc space.    MRI of the thoracic spine from 01/08/2023 was previously independently reviewed and interpreted, showing some increased T2 and STIR signal within the T6 level.  No other significant increased signal intensity within the thoracic vertebra.  No new fracture seen.  No retropulsion or canal stenosis seen.  XRs of the lumbar spine from 07/02/2023 were independently reviewed and interpreted,showing disc height loss at L4/5.  No other significant degenerative changes seen.  No fracture or dislocation seen.  No evidence of instability on flexion/extension views.     Patient name: Janice Arnold Patient MRN: [id number] Date of visit: 07/02/23

## 2023-07-03 ENCOUNTER — Other Ambulatory Visit: Payer: Self-pay | Admitting: Cardiovascular Disease

## 2023-07-03 ENCOUNTER — Other Ambulatory Visit: Payer: Self-pay | Admitting: *Deleted

## 2023-07-03 DIAGNOSIS — E559 Vitamin D deficiency, unspecified: Secondary | ICD-10-CM

## 2023-07-03 DIAGNOSIS — R790 Abnormal level of blood mineral: Secondary | ICD-10-CM

## 2023-07-03 DIAGNOSIS — E876 Hypokalemia: Secondary | ICD-10-CM

## 2023-07-03 MED ORDER — VITAMIN D (ERGOCALCIFEROL) 1.25 MG (50000 UNIT) PO CAPS: 50000.0000 [IU] | ORAL_CAPSULE | ORAL | 0 refills | Status: DC

## 2023-07-04 ENCOUNTER — Other Ambulatory Visit: Payer: Self-pay | Admitting: Family Medicine

## 2023-07-07 ENCOUNTER — Other Ambulatory Visit (INDEPENDENT_AMBULATORY_CARE_PROVIDER_SITE_OTHER): Payer: Self-pay

## 2023-07-07 DIAGNOSIS — E876 Hypokalemia: Secondary | ICD-10-CM | POA: Diagnosis not present

## 2023-07-07 DIAGNOSIS — R790 Abnormal level of blood mineral: Secondary | ICD-10-CM | POA: Diagnosis not present

## 2023-07-07 LAB — COMPREHENSIVE METABOLIC PANEL
ALT: 52 U/L — ABNORMAL HIGH (ref 0–35)
AST: 155 U/L — ABNORMAL HIGH (ref 0–37)
Albumin: 3.7 g/dL (ref 3.5–5.2)
Alkaline Phosphatase: 74 U/L (ref 39–117)
BUN: 10 mg/dL (ref 6–23)
CO2: 28 meq/L (ref 19–32)
Calcium: 9 mg/dL (ref 8.4–10.5)
Chloride: 102 meq/L (ref 96–112)
Creatinine, Ser: 0.58 mg/dL (ref 0.40–1.20)
GFR: 111.97 mL/min (ref >60.00)
Glucose, Bld: 95 mg/dL (ref 70–99)
Potassium: 3.5 meq/L (ref 3.5–5.1)
Sodium: 141 meq/L (ref 135–145)
Total Bilirubin: 1.3 mg/dL — ABNORMAL HIGH (ref 0.2–1.2)
Total Protein: 6.7 g/dL (ref 6.0–8.3)

## 2023-07-07 LAB — MAGNESIUM: Magnesium: 1.5 mg/dL (ref 1.5–2.5)

## 2023-07-07 NOTE — Progress Notes (Signed)
Potassium barely normal.  Continue 2 tabs 3x/day.   Liver tests more elevated.  Make sure she discusses with GI Is she still vomiting?.  Repeat cmp 1-2 wks

## 2023-07-08 ENCOUNTER — Other Ambulatory Visit: Payer: Self-pay | Admitting: *Deleted

## 2023-07-08 DIAGNOSIS — E876 Hypokalemia: Secondary | ICD-10-CM

## 2023-07-08 DIAGNOSIS — R7989 Other specified abnormal findings of blood chemistry: Secondary | ICD-10-CM

## 2023-07-14 ENCOUNTER — Ambulatory Visit: Payer: Managed Care, Other (non HMO) | Admitting: Orthopedic Surgery

## 2023-07-17 ENCOUNTER — Ambulatory Visit: Payer: Medicaid Other | Attending: Nurse Practitioner | Admitting: Nurse Practitioner

## 2023-07-17 NOTE — Progress Notes (Unsigned)
Office Visit    Patient Name: Janice Arnold Date of Encounter: 07/17/2023  Primary Care Provider:  Jeani Sow, MD Primary Cardiologist:  Nicki Guadalajara, MD  Chief Complaint    42 year old female with a history of QT prolongation, palpitations, prior alcohol abuse, hyperlipidemia, elevated LFTs, fatty liver, seizure, SDH, GERD, depression, and anxiety who presents for follow-up related to palpitations.   Past Medical History    Past Medical History:  Diagnosis Date   Alcohol abuse    history of heavy alcohol abuse, last drank alcohol in 06/2022   Anemia 2023   s/p iron infusions   Anxiety    Follows with PCP. Dr. Dewitt Rota.   Compression fracture of body of thoracic vertebra (HCC) 11/05/2022   T4, T5, T6 / Follows w/ orthopedic, Dr. Willia Craze. Fractures occurred while patient was having a seizure, per patient.   Cyclic vomiting syndrome    hospital admission on 05/28/22 for vomiting, follows with gastroenterologist Dr. Tiajuana Amass.   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 Tiajuana Amass.   GERD (gastroesophageal reflux disease)    takes omeprazole daily   Heart murmur    since childhood   Hyperlipidemia    11/20/22 lipid panel in Epic   Hypokalemia 10/15/2022   K+ level 2.5   Menorrhagia    Palpitations    Follows w/ cardiology, Anice Paganini, NP. 11/14/2022 echocardiogram EF 60 65%, 12/31/22 Event heart monitor results in Epic.   PONV (postoperative nausea and vomiting)    Prolonged QT interval    Follows w/ cardiology, Bernadene Person, NP.   Seizure (HCC) 06/06/2022   Follows with neurologist, Dr. Leim Fabry @ [facility]. Seizures were thought to be related to stopping alcohol. However, patient has had seizures months after quitting alcohol. As of 02/06/23, last seizure was on 11/07/22. Keppra was then increased to bid.    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 See 11/05/22 Head CT and MRI in Epic. Resolved per pt on 02/06/23.   Tremors of nervous system    whole body tremors, follows with Dr. Leim Fabry @ [facility].   Vitamin deficiency 2023   B12 deficiency, receiving monthly B12 injections / Vitamin D deficiency, taking supplement every two weeks   Wears contact lenses    Wears glasses    Past Surgical History:  Procedure Laterality Date   COLONOSCOPY WITH ESOPHAGOGASTRODUODENOSCOPY (EGD)  03/27/2022   one rectal polyp   LAPAROSCOPIC APPENDECTOMY N/A 03/02/2022   Procedure: APPENDECTOMY LAPAROSCOPIC;  Surgeon: Manus Rudd, MD;  Location: [facility];  Service: General;  Laterality: N/A;   ROBOTIC ASSISTED LAPAROSCOPIC HYSTERECTOMY AND SALPINGECTOMY Bilateral 03/04/2023   Procedure: ABORTED XI ROBOTIC ASSISTED LAPAROSCOPIC HYSTERECTOMY AND SALPINGECTOMY;  Surgeon: Olivia Mackie, MD;  Location: [facility] [place];  Service: Gynecology;  Laterality: Bilateral;  Requests 2/1/2 hrs.    Allergies  No Known Allergies   Labs/Other Studies Reviewed    The following studies were reviewed today:  Cardiac Studies & Procedures       ECHOCARDIOGRAM  ECHOCARDIOGRAM COMPLETE 11/14/2022  Narrative ECHOCARDIOGRAM REPORT    Patient Name:   [name] Date of Exam: 11/14/2022 Medical Rec #:  [id number]       Height:       67.0 in  Accession #:    [id number]      Weight:       216.2 lb Date of Birth:  04-01-1981       BSA:          2.091 m Patient Age:    41 years        BP:           120/82 mmHg Patient Gender: F               HR:           86 bpm. Exam Location:  [address]  Procedure: 2D Echo, 3D Echo, Cardiac Doppler and Color Doppler  Indications:    R94.31 Prolonged QT Syndrome  History:        Patient has no prior history of Echocardiogram examinations. Abnormal ECG, Arrythmias:Tachycardia,  Signs/Symptoms:Chest Pain, Shortness of Breath and Dizziness/Lightheadedness; Risk Factors:Dyslipidemia. Palpitations, History of ETOH Abuse.  Sonographer:    Farrel Conners RDCS Referring Phys: Lennette Bihari  IMPRESSIONS   1. Left ventricular ejection fraction, by estimation, is 60 to 65%. The left ventricle has normal function. The left ventricle has no regional wall motion abnormalities. There is mild concentric left ventricular hypertrophy. Left ventricular diastolic parameters were normal. 2. Right ventricular systolic function is normal. The right ventricular size is normal. Tricuspid regurgitation signal is inadequate for assessing PA pressure. 3. The mitral valve is normal in structure. Trivial mitral valve regurgitation. No evidence of mitral stenosis. 4. The aortic valve is tricuspid. Aortic valve regurgitation is not visualized. No aortic stenosis is present. 5. The inferior vena cava is normal in size with greater than 50% respiratory variability, suggesting right atrial pressure of 3 mmHg.  FINDINGS Left Ventricle: Left ventricular ejection fraction, by estimation, is 60 to 65%. The left ventricle has normal function. The left ventricle has no regional wall motion abnormalities. The left ventricular internal cavity size was normal in size. There is mild concentric left ventricular hypertrophy. Left ventricular diastolic parameters were normal.  Right Ventricle: The right ventricular size is normal. No increase in right ventricular wall thickness. Right ventricular systolic function is normal. Tricuspid regurgitation signal is inadequate for assessing PA pressure.  Left Atrium: Left atrial size was normal in size.  Right Atrium: Right atrial size was normal in size.  Pericardium: There is no evidence of pericardial effusion.  Mitral Valve: The mitral valve is normal in structure. Trivial mitral valve regurgitation. No evidence of mitral valve stenosis.  Tricuspid Valve:  The tricuspid valve is normal in structure. Tricuspid valve regurgitation is not demonstrated.  Aortic Valve: The aortic valve is tricuspid. Aortic valve regurgitation is not visualized. No aortic stenosis is present.  Pulmonic Valve: The pulmonic valve was normal in structure. Pulmonic valve regurgitation is not visualized.  Aorta: The aortic root is normal in size and structure.  Venous: The inferior vena cava is normal in size with greater than 50% respiratory variability, suggesting right atrial pressure of 3 mmHg.  IAS/Shunts: No atrial level shunt detected by color flow Doppler.   LEFT VENTRICLE PLAX 2D LVIDd:         4.50 cm   Diastology LVIDs:         2.60 cm   LV e' medial:    10.05 cm/s LV PW:         0.80 cm   LV E/e' medial:  8.8 LV IVS:        1.00 cm   LV e' lateral:  12.10 cm/s LVOT diam:     2.30 cm   LV E/e' lateral: 7.3 LV SV:         87 LV SV Index:   42 LVOT Area:     4.15 cm  3D Volume EF: 3D EF:        65 % LV EDV:       132 ml LV ESV:       46 ml LV SV:        86 ml  RIGHT VENTRICLE RV Basal diam:  3.80 cm RV S prime:     12.00 cm/s TAPSE (M-mode): 2.1 cm  LEFT ATRIUM             Index        RIGHT ATRIUM           Index LA diam:        4.20 cm 2.01 cm/m   RA Pressure: 3.00 mmHg LA Vol (A2C):   46.2 ml 22.10 ml/m  RA Area:     11.20 cm LA Vol (A4C):   71.2 ml 34.06 ml/m  RA Volume:   25.00 ml  11.96 ml/m LA Biplane Vol: 60.4 ml 28.89 ml/m AORTIC VALVE LVOT Vmax:   111.00 cm/s LVOT Vmean:  76.000 cm/s LVOT VTI:    0.209 m  AORTA Ao Root diam: 2.60 cm Ao Asc diam:  2.90 cm  MITRAL VALVE               TRICUSPID VALVE MV Area (PHT): cm         Estimated RAP:  3.00 mmHg MV Decel Time: 188 msec MV E velocity: 88.55 cm/s  SHUNTS MV A velocity: 59.75 cm/s  Systemic VTI:  0.21 m MV E/A ratio:  1.48        Systemic Diam: 2.30 cm  Dalton McleanMD Electronically signed by Wilfred Lacy Signature Date/Time: 11/14/2022/5:08:02  PM    Final    MONITORS  LONG TERM MONITOR (3-14 DAYS) 12/31/2022  Narrative Patch Wear Time:  14 days and 0 hours (2024-04-16T22:05:51-0400 to 2024-05-09T18:20:20-0400)  Monitor 1 Patient had a min HR of 56 bpm, max HR of 142 bpm, and avg HR of 92 bpm. Predominant underlying rhythm was Sinus Rhythm. Isolated SVEs were rare (<1.0%), SVE Couplets were rare (<1.0%), and no SVE Triplets were present. Isolated VEs were rare (<1.0%), VE Couplets were rare (<1.0%), and no VE Triplets were present.  Monitor 2 Patient had a min HR of 61 bpm, max HR of 148 bpm, and avg HR of 87 bpm. Predominant underlying rhythm was Sinus Rhythm. Isolated SVEs were rare (<1.0%), and no SVE Couplets or SVE Triplets were present. Isolated VEs were rare (<1.0%), and no VE Couplets or VE Triplets were present. Inverted QRS complexes possibly due to inverted placement of device.  The predominant rhythm is sinus rhythm with average rate at 92 and 87 on subsequent monitors.  There were rare PACs and PVCs.  No high-grade ectopy was demonstrated.  There were no episodes of atrial fibrillation or significant sinus pauses.          Recent Labs: 06/27/2023: Hemoglobin 11.2; Platelets 194 07/02/2023: TSH 3.71 07/07/2023: ALT 52; BUN 10; Creatinine, Ser 0.58; Magnesium 1.5; Potassium 3.5; Sodium 141  Recent Lipid Panel    Component Value Date/Time   CHOL 229 (H) 11/20/2022 1152   TRIG 159 (H) 11/20/2022 1152   HDL 47 11/20/2022 1152   CHOLHDL 4.9 (H) 11/20/2022 1152   CHOLHDL 3  10/06/2019 0909   VLDL 24.6 10/06/2019 0909   LDLCALC 153 (H) 11/20/2022 1152    History of Present Illness    42 year old female with the above past medical history including QT prolongation, prior alcohol abuse, hyperlipidemia, elevated LFTs, fatty liver, seizure, SDH, GERD, depression, and anxiety.   She has a history of prior alcohol abuse, quit cold [place] in June 2023.  She has a history of cyclical vomiting and takes as needed  Zofran, Phenergan.  She was hospitalized in October 2023 for seizures.  Head CT showed right cerebral convexity chronic subdural hematoma, hygroma measuring 1.3 cm in maximal thickness with mild mass defect and 3 mm right to left midline shift.  There was no acute intracranial hemorrhage.  She was started on Keppra, however, this was later discontinued per neurology.  She saw her PCP who noted concern for QT prolongation.  Lexapro was discontinued.  She was referred to cardiology in the setting of QT prolongation.    Echocardiogram in 11/2022  showed EF 60 to 65%, normal LV function, no RWMA, mild concentric LVH, normal RV systolic function, no significant valvular abnormalities.  She was last seen in the office on 11/15/2022 and was stable from a cardiac standpoint.  She does have a history of sinus tachycardia, on metoprolol.  At her visit, she noted increased palpitations.  14-day Zio patch revealed predominantly sinus rhythm, rare PACs and PVCs, no significant arrhythmia.  She was evaluated in the ED on 06/27/2023 in the setting of hypokalemia, UTI.  She was given IV potassium and Keflex and  discharged home.   She presents today for follow-up.  Since her last visit she has   1. QT prolongation: Stable on today's EKG. Lexapro was changed to Zoloft.  Recent echo in 11/2022 was normal.   2. Palpitations: She has a history of sinus tachycardia.  She notes recent increase in palpitations.  She states she will have palpitations for hours at a time with associated chest tightness, mild shortness of breath.  Symptoms occur approximately 5 to 6 days a week. Will check 14-day ZIO.  Discussed ED precautions.  Continue metoprolol.   3. Hyperlipidemia/elevated LFTs: Recently started on Zetia.  Will repeat fasting lipid panel, LFTs.  Continue Zetia.   4. History of seizure: Recent seizure activity which resulted in a compression fracture at the T5 level.  Following with neurology.   5. History of snoring: Sleep study  is pending.   6. Disposition: Follow-up in   Home Medications    Current Outpatient Medications  Medication Sig Dispense Refill   acetaminophen (TYLENOL) 500 MG tablet Take 500 mg by mouth every 6 (six) hours as needed.     cephALEXin (KEFLEX) 500 MG capsule Take 1 capsule (500 mg total) by mouth 4 (four) times daily. 28 capsule 0   DULoxetine (CYMBALTA) 60 MG capsule TAKE 1 CAPSULE BY MOUTH EVERY DAY 90 capsule 0   ezetimibe (ZETIA) 10 MG tablet Take 1 tablet (10 mg total) by mouth daily. 90 tablet 3   hydrochlorothiazide (HYDRODIURIL) 25 MG tablet Take 1 tablet (25 mg total) by mouth daily as needed. 90 tablet 0   HYDROcodone-acetaminophen (NORCO/VICODIN) 5-325 MG tablet Take 1 tablet by mouth every 6 (six) hours as needed for moderate pain. 15 tablet 0   hydrOXYzine (VISTARIL) 25 MG capsule Take 1 capsule (25 mg total) by mouth every 8 (eight) hours as needed. 180 capsule 0   ibuprofen (ADVIL) 100 MG tablet Take 200 mg by mouth every  6 (six) hours as needed for fever.     KLOR-CON M20 20 MEQ tablet TAKE 2 TABLETS (40 MEQ TOTAL) BY MOUTH 3 (THREE) TIMES DAILY. 540 tablet 1   levETIRAcetam (KEPPRA) 750 MG tablet Take 750 mg by mouth 2 (two) times daily. 1 tablet twice a day     methocarbamol (ROBAXIN) 500 MG tablet TAKE 1 TABLET BY MOUTH UP TO EVERY 8 HOURS AS NEEDED FOR PAIN/MUSCLE SPASMS 70 tablet 0   methylPREDNISolone (MEDROL DOSEPAK) 4 MG TBPK tablet Take as prescribed on the box 21 tablet 0   metoCLOPramide (REGLAN) 10 MG tablet Take 1 tablet (10 mg total) by mouth every 8 (eight) hours as needed for nausea. 30 tablet 0   metoprolol succinate (TOPROL-XL) 25 MG 24 hr tablet TAKE 1 TABLET (25 MG TOTAL) BY MOUTH DAILY. 90 tablet 3   norethindrone (AYGESTIN) 5 MG tablet Take by mouth daily.     norgestimate-ethinyl estradiol (ORTHO-CYCLEN) 0.25-35 MG-MCG tablet Take 1 tablet by mouth daily.     omeprazole (PRILOSEC) 40 MG capsule Take 1 capsule (40 mg total) by mouth daily. 90 capsule 1    ondansetron (ZOFRAN-ODT) 4 MG disintegrating tablet TAKE 1 TABLET BY MOUTH EVERY 8 HOURS AS NEEDED FOR NAUSEA AND VOMITING 20 tablet 1   oxyCODONE (OXY IR/ROXICODONE) 5 MG immediate release tablet Take 5 mg by mouth every 4 (four) hours as needed for severe pain. 1-2 tablets every 4 hrs as needed.     promethazine (PHENERGAN) 25 MG tablet TAKE 1 TABLET (25 MG TOTAL) BY MOUTH DAILY. 90 tablet 0   spironolactone (ALDACTONE) 25 MG tablet Take 1 tablet (25 mg total) by mouth daily. 90 tablet 3   triamcinolone cream (KENALOG) 0.1 % Apply 1 Application topically 2 (two) times daily. (Patient taking differently: Apply 1 Application topically 2 (two) times daily. As needed) 30 g 0   Vitamin D, Ergocalciferol, (DRISDOL) 1.25 MG (50000 UNIT) CAPS capsule Take 1 capsule (50,000 Units total) by mouth every 7 (seven) days. 12 capsule 0   No current facility-administered medications for this visit.     Review of Systems    ***.  All other systems reviewed and are otherwise negative except as noted above.    Physical Exam    VS:  There were no vitals taken for this visit. , BMI There is no height or weight on file to calculate BMI.     GEN: Well nourished, well developed, in no acute distress. HEENT: normal. Neck: Supple, no JVD, carotid bruits, or masses. Cardiac: RRR, no murmurs, rubs, or gallops. No clubbing, cyanosis, edema.  Radials/DP/PT 2+ and equal bilaterally.  Respiratory:  Respirations regular and unlabored, clear to auscultation bilaterally. GI: Soft, nontender, nondistended, BS + x 4. MS: no deformity or atrophy. Skin: warm and dry, no rash. Neuro:  Strength and sensation are intact. Psych: Normal affect.  Accessory Clinical Findings    ECG personally reviewed by me today -    - no acute changes.   Lab Results  Component Value Date   WBC 6.5 06/27/2023   HGB 11.2 (L) 06/27/2023   HCT 33.6 (L) 06/27/2023   MCV 109.4 (H) 06/27/2023   PLT 194 06/27/2023   Lab Results  Component  Value Date   CREATININE 0.58 07/07/2023   BUN 10 07/07/2023   NA 141 07/07/2023   K 3.5 07/07/2023   CL 102 07/07/2023   CO2 28 07/07/2023   Lab Results  Component Value Date   ALT 52 (  H) 07/07/2023   AST 155 (H) 07/07/2023   ALKPHOS 74 07/07/2023   BILITOT 1.3 (H) 07/07/2023   Lab Results  Component Value Date   CHOL 229 (H) 11/20/2022   HDL 47 11/20/2022   LDLCALC 153 (H) 11/20/2022   TRIG 159 (H) 11/20/2022   CHOLHDL 4.9 (H) 11/20/2022    Lab Results  Component Value Date   HGBA1C <4.2 10/15/2022    Assessment & Plan    1.  ***  No BP recorded.  {Refresh Note OR Click here to enter BP  :1}***   Joylene Grapes, NP 07/17/2023, 5:13 AM

## 2023-07-18 ENCOUNTER — Telehealth: Payer: Self-pay | Admitting: Cardiovascular Disease

## 2023-07-18 NOTE — Telephone Encounter (Signed)
Left vm in regards to FMLA/Disability paperwork received [employer]. Need 2 forms completed by pt (ROI & Billing) + $29 processing fee (cash, check, or money order) - can be done in office. Informed pt that Dr. Tresa Endo would not be in office until next week, but paperwork will be left in Dr. Landry Dyke mailbox & he will complete at earliest convenience.  JB, 07-18-23

## 2023-07-21 ENCOUNTER — Ambulatory Visit: Payer: Managed Care, Other (non HMO) | Admitting: Neurology

## 2023-07-21 ENCOUNTER — Telehealth: Payer: Self-pay

## 2023-07-21 NOTE — Telephone Encounter (Signed)
Received paperwork from [employer] - [employer] Statement.  Sent mychart message to pt regarding form fee

## 2023-07-22 ENCOUNTER — Telehealth: Payer: Self-pay | Admitting: Orthopedic Surgery

## 2023-07-22 ENCOUNTER — Encounter: Payer: Self-pay | Admitting: Radiology

## 2023-07-22 DIAGNOSIS — G8929 Other chronic pain: Secondary | ICD-10-CM

## 2023-07-22 NOTE — Telephone Encounter (Signed)
Note written, pt is aware that the note is done and that the PT order has been placed

## 2023-07-22 NOTE — Telephone Encounter (Signed)
Patient called. She would like a note to be out of work from 11/20 until her next visit. Also would like a referral for PT. Her cb# is [phone]

## 2023-07-23 ENCOUNTER — Other Ambulatory Visit: Payer: Self-pay | Admitting: Family Medicine

## 2023-07-23 DIAGNOSIS — F4323 Adjustment disorder with mixed anxiety and depressed mood: Secondary | ICD-10-CM

## 2023-07-23 NOTE — Telephone Encounter (Signed)
Called, left voicemail for pt to call. Dr. Tresa Endo is asking the reasoning for FMLA

## 2023-07-25 ENCOUNTER — Other Ambulatory Visit: Payer: Self-pay | Admitting: Family Medicine

## 2023-07-28 NOTE — Progress Notes (Deleted)
Chief Complaint: Primary GI MD: Dr. Tomasa Rand  HPI: 42 year old female history of iron deficiency anemia (on iron infusions), cyclic vomiting syndrome, s/p hysterectomy, presents for follow-up.  Last seen July 2023 by Dr. Tomasa Rand for unintentional weight loss, IDA nausea/vomiting, elevated LFTs.  At that time she was having initial consultation for iron deficiency anemia which appeared to be mixed iron deficiency and B12 anemia.  Source of anemia was thought to be combination of menstrual blood loss, B12 deficiency, alcohol use (she reports drinking 8 shots of vodka daily for years but stopped prior to last visit) Patient also had unintentional weight loss in which she went from 400 pounds in 2019 to 181 in 2023 She was also found to have nausea/vomiting episodes thought to be cyclic vomiting syndrome versus anxiety/GBAD  Patient underwent extensive workup including - CT abdomen pelvis with contrast 02/25/2022 showing appendicitis, hepatomegaly, hepatic steatosis, splenomegaly - RUQ ultrasound 05/29/2022 showed normal gallbladder, fatty liver - Liver serology was negative for autoimmune liver disease and genetic etiology.  She did not have immunity to hepatitis B and recommended undergoing vaccine series - H. pylori antibody IgG negative - EGD/colonoscopy without source of anemia (see results below)  Recent labs 06/2023 AST 155, ALT 52, alk phos 74 Total bilirubin 1.3 TSH normal Hgb 11.5, MCV 108.5 B12 402 Iron 121, saturation 46% ferritin 81  ----------------TODAY---------------------   PREVIOUS GI WORKUP   EGD 03/27/2022 for IDA and nausea with vomiting - The examined portions of the nasopharynx, oropharynx and larynx were normal.  - Z- line irregular. - Erosive gastropathy with no bleeding and no stigmata of recent bleeding. Biopsied. These findings may be a source of nausea/ vomiting  - Erythematous mucosa in the gastric body. Biopsied.  - Normal examined duodenum.  Biopsied.  Colonoscopy 03/27/2022 - Diverticulosis in the ascending colon.  - One 8 mm polyp in the distal rectum, removed with a cold snare. Resected and retrieved. - The examined portion of the ileum was normal.  - The distal rectum and anal verge are normal on retroflexion view.  - No abnormalities to explain weight loss or iron deficiency anemia. Suspect patient' s anemia likely multifactorial, from menorrhagia and alcohol  Diagnosis 1. Surgical [P], duodenal BENIGN DUODENAL MUCOSA WITH NO DIAGNOSTIC ABNORMALITY 2. 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 PYLORI, INTESTINAL METAPLASIA, DYSPLASIA AND CARCINOMA 3. Surgical [P], gastric body MILD CHRONIC GASTRITIS WITH REACTIVE EPITHELIAL CHANGES NEGATIVE FOR H. PYLORI, INTESTINAL METAPLASIA, DYSPLASIA AND CARCINOMA 4. Surgical [P], colon, rectum, polyp (1) TUBULAR ADENOMA NEGATIVE FOR HIGH-GRADE DYSPLASIA AND CARCINOMA  Past Medical History:  Diagnosis Date   Alcohol abuse    history of heavy alcohol abuse, last drank alcohol in 06/2022   Anemia 2023   s/p iron infusions   Anxiety    Follows with PCP. Dr. Dewitt Rota.   Compression fracture of body of thoracic vertebra (HCC) 11/05/2022   T4, T5, T6 / Follows w/ orthopedic, Dr. Willia Craze. Fractures occurred while patient was having a seizure, per patient.   Cyclic vomiting syndrome    hospital admission on 05/28/22 for vomiting, follows with gastroenterologist Dr. Tiajuana Amass.   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 Tiajuana Amass.   GERD (gastroesophageal reflux disease)    takes omeprazole daily   Heart murmur  since childhood   Hyperlipidemia    11/20/22 lipid panel in Epic   Hypokalemia 10/15/2022   K+ level 2.5   Menorrhagia    Palpitations    Follows  w/ cardiology, Anice Paganini, NP. 11/14/2022 echocardiogram EF 60 65%, 12/31/22 Event heart monitor results in Epic.   PONV (postoperative nausea and vomiting)    Prolonged QT interval    Follows w/ cardiology, Bernadene Person, NP.   Seizure (HCC) 06/06/2022   Follows with neurologist, Dr. Leim Fabry @ [facility]. Seizures were thought to be related to stopping alcohol. However, patient has had seizures months after quitting alcohol. As of 02/06/23, last seizure was on 11/07/22. Keppra was then increased to bid.   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 See 11/05/22 Head CT and MRI in Epic. Resolved per pt on 02/06/23.   Tremors of nervous system    whole body tremors, follows with Dr. Leim Fabry @ [facility].   Vitamin deficiency 2023   B12 deficiency, receiving monthly B12 injections / Vitamin D deficiency, taking supplement every two weeks   Wears contact lenses    Wears glasses     Past Surgical History:  Procedure Laterality Date   COLONOSCOPY WITH ESOPHAGOGASTRODUODENOSCOPY (EGD)  03/27/2022   one rectal polyp   LAPAROSCOPIC APPENDECTOMY N/A 03/02/2022   Procedure: APPENDECTOMY LAPAROSCOPIC;  Surgeon: Manus Rudd, MD;  Location: [facility];  Service: General;  Laterality: N/A;   ROBOTIC ASSISTED LAPAROSCOPIC HYSTERECTOMY AND SALPINGECTOMY Bilateral 03/04/2023   Procedure: ABORTED XI ROBOTIC ASSISTED LAPAROSCOPIC HYSTERECTOMY AND SALPINGECTOMY;  Surgeon: Olivia Mackie, MD;  Location: [facility];  Service: Gynecology;  Laterality: Bilateral;  Requests 2/1/2 hrs.    Current Outpatient Medications  Medication Sig Dispense Refill   acetaminophen (TYLENOL) 500 MG tablet Take 500 mg by mouth every 6 (six) hours as needed.     cephALEXin (KEFLEX) 500 MG capsule Take 1 capsule (500 mg total) by mouth 4 (four) times daily. 28 capsule 0   DULoxetine (CYMBALTA) 60 MG capsule TAKE 1 CAPSULE BY  MOUTH EVERY DAY 90 capsule 0   ezetimibe (ZETIA) 10 MG tablet Take 1 tablet (10 mg total) by mouth daily. 90 tablet 3   hydrochlorothiazide (HYDRODIURIL) 25 MG tablet Take 1 tablet (25 mg total) by mouth daily as needed. 90 tablet 0   HYDROcodone-acetaminophen (NORCO/VICODIN) 5-325 MG tablet Take 1 tablet by mouth every 6 (six) hours as needed for moderate pain. 15 tablet 0   hydrOXYzine (VISTARIL) 25 MG capsule Take 1 capsule (25 mg total) by mouth every 8 (eight) hours as needed. 180 capsule 0   ibuprofen (ADVIL) 100 MG tablet Take 200 mg by mouth every 6 (six) hours as needed for fever.     KLOR-CON M20 20 MEQ tablet TAKE 2 TABLETS (40 MEQ TOTAL) BY MOUTH 3 (THREE) TIMES DAILY. 540 tablet 1   levETIRAcetam (KEPPRA) 750 MG tablet Take 750 mg by mouth 2 (two) times daily. 1 tablet twice a day     methocarbamol (ROBAXIN) 500 MG tablet TAKE 1 TABLET BY MOUTH UP TO EVERY 8 HOURS AS NEEDED FOR PAIN/MUSCLE SPASMS 70 tablet 0   methylPREDNISolone (MEDROL DOSEPAK) 4 MG TBPK tablet Take as prescribed on the box 21 tablet 0   metoCLOPramide (REGLAN) 10 MG tablet Take 1 tablet (10 mg total) by mouth every 8 (eight) hours as needed for nausea. 30 tablet 0   metoprolol succinate (TOPROL-XL) 25  MG 24 hr tablet TAKE 1 TABLET (25 MG TOTAL) BY MOUTH DAILY. 90 tablet 3   norethindrone (AYGESTIN) 5 MG tablet Take by mouth daily.     norgestimate-ethinyl estradiol (ORTHO-CYCLEN) 0.25-35 MG-MCG tablet Take 1 tablet by mouth daily.     omeprazole (PRILOSEC) 40 MG capsule Take 1 capsule (40 mg total) by mouth daily. 90 capsule 1   ondansetron (ZOFRAN-ODT) 4 MG disintegrating tablet TAKE 1 TABLET BY MOUTH EVERY 8 HOURS AS NEEDED FOR NAUSEA AND VOMITING 20 tablet 1   oxyCODONE (OXY IR/ROXICODONE) 5 MG immediate release tablet Take 5 mg by mouth every 4 (four) hours as needed for severe pain. 1-2 tablets every 4 hrs as needed.     promethazine (PHENERGAN) 25 MG tablet TAKE 1 TABLET (25 MG TOTAL) BY MOUTH DAILY. 90 tablet 0    spironolactone (ALDACTONE) 25 MG tablet Take 1 tablet (25 mg total) by mouth daily. 90 tablet 3   triamcinolone cream (KENALOG) 0.1 % Apply 1 Application topically 2 (two) times daily. (Patient taking differently: Apply 1 Application topically 2 (two) times daily. As needed) 30 g 0   Vitamin D, Ergocalciferol, (DRISDOL) 1.25 MG (50000 UNIT) CAPS capsule Take 1 capsule (50,000 Units total) by mouth every 7 (seven) days. 12 capsule 0   No current facility-administered medications for this visit.    Allergies as of 07/29/2023   (No Known Allergies)    Family History  Problem Relation Age of Onset   Breast cancer Mother    Cervical cancer Mother        ovarian   Bone cancer Mother    Breast cancer Maternal Grandmother    Colon cancer Maternal Great-grandmother 2   Esophageal cancer Neg Hx    Stomach cancer Neg Hx    Seizures Neg Hx    Stroke Neg Hx     Social History   Socioeconomic History   Marital status: Married    Spouse name: Not on file   Number of children: 3   Years of education: Not on file   Highest education level: Associate degree: occupational, Scientist, product/process development, or vocational program  Occupational History   Occupation: Emergency planning/management officer  Tobacco Use   Smoking status: Never   Smokeless tobacco: Never  Vaping Use   Vaping status: Never Used  Substance and Sexual Activity   Alcohol use: Not Currently    Comment: History of heavy alcohol abuse. Quit in 2023. Last drank alcohol in 06/2022.   Drug use: Never   Sexual activity: Yes    Birth control/protection: Pill  Other Topics Concern   Not on file  Social History Narrative   Project mgr   Social Drivers of Health   Financial Resource Strain: Low Risk  (11/12/2022)   Overall Financial Resource Strain (CARDIA)    Difficulty of Paying Living Expenses: Not very hard  Food Insecurity: Food Insecurity Present (11/12/2022)   Hunger Vital Sign    Worried About Running Out of Food in the Last Year: Sometimes true    Ran  Out of Food in the Last Year: Sometimes true  Transportation Needs: No Transportation Needs (11/12/2022)   PRAPARE - Administrator, Civil Service (Medical): No    Lack of Transportation (Non-Medical): No  Physical Activity: Unknown (11/12/2022)   Exercise Vital Sign    Days of Exercise per Week: 0 days    Minutes of Exercise per Session: Not on file  Stress: Stress Concern Present (11/12/2022)   [employer] of Occupational  Health - Occupational Stress Questionnaire    Feeling of Stress : Rather much  Social Connections: Socially Isolated (11/12/2022)   Social Connection and Isolation Panel [NHANES]    Frequency of Communication with Friends and Family: Once a week    Frequency of Social Gatherings with Friends and Family: Once a week    Attends Religious Services: Never    Database administrator or Organizations: No    Attends Engineer, structural: Not on file    Marital Status: Married  Catering manager Violence: Not At Risk (06/07/2022)   Humiliation, Afraid, Rape, and Kick questionnaire    Fear of Current or Ex-Partner: No    Emotionally Abused: No    Physically Abused: No    Sexually Abused: No    Review of Systems:    Constitutional: No weight loss, fever, chills, weakness or fatigue HEENT: Eyes: No change in vision               Ears, Nose, Throat:  No change in hearing or congestion Skin: No rash or itching Cardiovascular: No chest pain, chest pressure or palpitations   Respiratory: No SOB or cough Gastrointestinal: See HPI and otherwise negative Genitourinary: No dysuria or change in urinary frequency Neurological: No headache, dizziness or syncope Musculoskeletal: No new muscle or joint pain Hematologic: No bleeding or bruising Psychiatric: No history of depression or anxiety    Physical Exam:  Vital signs: There were no vitals taken for this visit.  Constitutional: NAD, Well developed, Well nourished, alert and cooperative Head:   Normocephalic and atraumatic. Eyes:   PEERL, EOMI. No icterus. Conjunctiva pink. Respiratory: Respirations even and unlabored. Lungs clear to auscultation bilaterally.   No wheezes, crackles, or rhonchi.  Cardiovascular:  Regular rate and rhythm. No peripheral edema, cyanosis or pallor.  Gastrointestinal:  Soft, nondistended, nontender. No rebound or guarding. Normal bowel sounds. No appreciable masses or hepatomegaly. Rectal:  Not performed.  Msk:  Symmetrical without gross deformities. Without edema, no deformity or joint abnormality.  Neurologic:  Alert and  oriented x4;  grossly normal neurologically.  Skin:   Dry and intact without significant lesions or rashes. Psychiatric: Oriented to person, place and time. Demonstrates good judgement and reason without abnormal affect or behaviors.   RELEVANT LABS AND IMAGING: CBC    Component Value Date/Time   WBC 6.5 06/27/2023 1037   RBC 3.07 (L) 06/27/2023 1037   HGB 11.2 (L) 06/27/2023 1037   HGB 11.5 (L) 06/26/2023 1000   HGB 12.9 07/16/2022 0939   HCT 33.6 (L) 06/27/2023 1037   HCT 37.6 07/16/2022 0939   PLT 194 06/27/2023 1037   PLT 193 06/26/2023 1000   PLT 295 07/16/2022 0939   MCV 109.4 (H) 06/27/2023 1037   MCV 102 (H) 07/16/2022 0939   MCH 36.5 (H) 06/27/2023 1037   MCHC 33.3 06/27/2023 1037   RDW 14.5 06/27/2023 1037   RDW 14.2 07/16/2022 0939   LYMPHSABS 1.3 06/26/2023 1000   MONOABS 0.7 06/26/2023 1000   EOSABS 0.1 06/26/2023 1000   BASOSABS 0.1 06/26/2023 1000    CMP     Component Value Date/Time   NA 141 07/07/2023 1100   NA 137 07/16/2022 0939   K 3.5 07/07/2023 1100   CL 102 07/07/2023 1100   CO2 28 07/07/2023 1100   GLUCOSE 95 07/07/2023 1100   BUN 10 07/07/2023 1100   BUN 12 07/16/2022 0939   CREATININE 0.58 07/07/2023 1100   CREATININE 0.66 06/26/2023 1000  CALCIUM 9.0 07/07/2023 1100   PROT 6.7 07/07/2023 1100   PROT 6.3 11/20/2022 1152   ALBUMIN 3.7 07/07/2023 1100   ALBUMIN 4.3 11/20/2022 1152    AST 155 (H) 07/07/2023 1100   AST 80 (H) 06/26/2023 1000   ALT 52 (H) 07/07/2023 1100   ALT 22 06/26/2023 1000   ALKPHOS 74 07/07/2023 1100   BILITOT 1.3 (H) 07/07/2023 1100   BILITOT 2.9 (H) 06/26/2023 1000   GFRNONAA >60 06/27/2023 1037   GFRNONAA >60 06/26/2023 1000     Assessment/Plan:       [facility] 07/28/2023, 9:06 AM  Cc: Jeani Sow, MD

## 2023-07-29 ENCOUNTER — Ambulatory Visit: Payer: Managed Care, Other (non HMO) | Admitting: Gastroenterology

## 2023-07-30 ENCOUNTER — Encounter: Payer: Self-pay | Admitting: Nurse Practitioner

## 2023-07-30 ENCOUNTER — Ambulatory Visit (INDEPENDENT_AMBULATORY_CARE_PROVIDER_SITE_OTHER): Payer: Medicaid Other | Admitting: Nurse Practitioner

## 2023-07-30 VITALS — BP 120/80 | HR 116 | Ht 67.0 in | Wt 229.1 lb

## 2023-07-30 DIAGNOSIS — R112 Nausea with vomiting, unspecified: Secondary | ICD-10-CM | POA: Diagnosis not present

## 2023-07-30 NOTE — Patient Instructions (Signed)
You have been scheduled for a gastric emptying scan at [facility] on 08/05/23 at 7:30 am. Please arrive at least 30 minutes prior to your appointment for registration. Please make certain not to have anything to eat or drink after midnight the night before your test. DO NOT TAKE ANY MEDICATIONS BEFORE your test. If you need to reschedule your appointment, please contact radiology scheduling at [phone]. _____________________________________________________________________ A gastric-emptying study measures how long it takes for food to move through your stomach. There are several ways to measure stomach emptying. In the most common test, you eat food that contains a small amount of radioactive material. A scanner that detects the movement of the radioactive material is placed over your abdomen to monitor the rate at which food leaves your stomach. This test normally takes about 4 hours to complete.   If your blood pressure at your visit was 140/90 or greater, please contact your primary care physician to follow up on this.  _______________________________________________________  If you are age 14 or older, your body mass index should be between 23-30. Your Body mass index is 35.89 kg/m. If this is out of the aforementioned range listed, please consider follow up with your Primary Care Provider.  If you are age 55 or younger, your body mass index should be between 19-25. Your Body mass index is 35.89 kg/m. If this is out of the aformentioned range listed, please consider follow up with your Primary Care Provider.   ________________________________________________________  The [place] [facility] providers would like to encourage you to use [facility] to communicate with providers for non-urgent requests or questions.  Due to long hold times on the telephone, sending your provider a message by [facility] may be a faster and more efficient way to get a response.  Please allow 48 business hours for a  response.  Please remember that this is for non-urgent requests.  _______________________________________________________  Due to recent changes in healthcare laws, you may see the results of your imaging and laboratory studies on MyChart before your provider has had a chance to review them.  We understand that in some cases there may be results that are confusing or concerning to you. Not all laboratory results come back in the same time frame and the provider may be waiting for multiple results in order to interpret others.  Please give [place] 48 hours in order for your provider to thoroughly review all the results before contacting the office for clarification of your results.   Thank you for entrusting me with your care and choosing [facility].  Willette Cluster NP

## 2023-07-30 NOTE — Progress Notes (Signed)
ASSESSMENT    Brief Narrative:  42 y.o.  female known to Dr. Tomasa Rand  with a past medical history not limited to colon polyps   Chronic nausea with episodic vomiting.  Could be cyclic vomiting syndrome but she describes nearly constant nausea.   EGD unrevealing.  No associated weight loss.    See [facility] for any additional medical & surgical history   PLAN   --Will order a gastric emptying study.  -- Will consider TCA for possible cyclic vomiting syndrome following gastric emptying study results.  -- She is requesting a letter from [place] regarding her inability to work due to nausea and vomiting.  I will discuss with Dr. Tomasa Rand but I am not sure he will provide this without having a cause for her symptoms.   HPI   Chief complaint : Persistent nausea, cycles of vomiting  Brief GI History:  Oriana was previously evaluated here for deficiency anemia, nausea vomiting, unintentional weight loss and elevated LFTs.  Refer to 02/21/2022 office note for details.  In summary, elevated liver chemistries were felt to be secondary to EtOH.  We ordered a CT scan which showed hepatosplenomegaly, hepatic steatosis and possible acute appendicitis.  She subsequently underwent appendectomy.  Following that in mid August she underwent an EGD and colonoscopy with Dr. Tomasa Rand.  EGD showed erosive gastropathy. A small tubular adenoma removed from the colon.   Interval History:  Brittan returns for persistent nausea and vomiting.  She is almost constantly nauseated.  The vomiting comes and cycles.  She may not vomit for a few weeks then have 10 or so days of vomiting multiple times a day.  Few weeks ago started taking a daily promethazine but so far no improvement.  She takes Zofran with the actual vomiting flares but that does not seem to help.  Recently prescribed metoclopramide in ED but it did not provide any relief either.  She cannot correlate triggers with stress.  No THC use.  The constant nausea  and cycles of vomiting are affecting her ability to work.  She is apparently on her pursuing disability due to the nausea, vomiting, depression.  Her weight is stable  *Hydrocodone on home med list but she does not take   GI History / Pertinent GI Studies   **All endoscopic studies may not be included here    Aug 2023 EGD for IDA, N&V --Erosive gastropathy. Biopsied  Aug 2023 colonoscopy  Diverticulosis in ascending colon. One 8 mm rectal polyp was removed. Examined portion of ileum was normal .No abnormalities to explain weight loss, symptoms.   Surgical [P], duodenal BENIGN DUODENAL MUCOSA WITH NO DIAGNOSTIC ABNORMALITY 2. 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 PYLORI, INTESTINAL METAPLASIA, DYSPLASIA AND CARCINOMA 3. Surgical [P], gastric body MILD CHRONIC GASTRITIS WITH REACTIVE EPITHELIAL CHANGES NEGATIVE FOR H. PYLORI, INTESTINAL METAPLASIA, DYSPLASIA AND CARCINOMA 4. Surgical [P], colon, rectum, polyp (1) TUBULAR ADENOMA NEGATIVE FOR HIGH-GRADE DYSPLASIA AND CARCINOMA  Repeat colonoscopy in 7 years.     Latest Ref Rng & Units 07/07/2023   11:00 AM 07/02/2023   11:46 AM 06/27/2023   10:37 AM  Hepatic Function  Total Protein 6.0 - 8.3 g/dL 6.7  6.6  6.6   Albumin 3.5 - 5.2 g/dL 3.7  3.7  3.2   AST 0 - 37 U/L 155  105  70   ALT 0 - 35 U/L 52  26  22   Alk Phosphatase  39 - 117 U/L 74  92  65   Total Bilirubin 0.2 - 1.2 mg/dL 1.3  1.4  2.6        Latest Ref Rng & Units 06/27/2023   10:37 AM 06/26/2023   10:00 AM 02/24/2023   11:00 AM  CBC  WBC 4.0 - 10.5 K/uL 6.5  6.4  11.7   Hemoglobin 12.0 - 15.0 g/dL 56.3  87.5  64.3   Hematocrit 36.0 - 46.0 % 33.6  34.4  43.1   Platelets 150 - 400 K/uL 194  193  238      Past Medical History:  Diagnosis Date   Alcohol abuse    history of heavy alcohol abuse, last drank alcohol in 06/2022   Anemia 2023   s/p iron infusions   Anxiety    Follows  with PCP. Dr. Dewitt Rota.   Compression fracture of body of thoracic vertebra (HCC) 11/05/2022   T4, T5, T6 / Follows w/ orthopedic, Dr. Willia Craze. Fractures occurred while patient was having a seizure, per patient.   Cyclic vomiting syndrome    hospital admission on 05/28/22 for vomiting, follows with gastroenterologist Dr. Tiajuana Amass.   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 Tiajuana Amass.   GERD (gastroesophageal reflux disease)    takes omeprazole daily   Heart murmur    since childhood   Hyperlipidemia    11/20/22 lipid panel in Epic   Hypokalemia 10/15/2022   K+ level 2.5   Menorrhagia    Palpitations    Follows w/ cardiology, Anice Paganini, NP. 11/14/2022 echocardiogram EF 60 65%, 12/31/22 Event heart monitor results in Epic.   PONV (postoperative nausea and vomiting)    Prolonged QT interval    Follows w/ cardiology, Bernadene Person, NP.   Seizure (HCC) 06/06/2022   Follows with neurologist, Dr. Leim Fabry @ [facility]. Seizures were thought to be related to stopping alcohol. However, patient has had seizures months after quitting alcohol. As of 02/06/23, last seizure was on 11/07/22. Keppra was then increased to bid.   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 See 11/05/22 Head CT and MRI in Epic. Resolved per pt on 02/06/23.   Tremors of nervous system    whole body tremors, follows with Dr. Leim Fabry @ [facility].   Vitamin deficiency 2023   B12 deficiency, receiving monthly B12 injections / Vitamin D deficiency, taking supplement every two weeks   Wears contact lenses    Wears glasses     Past Surgical History:  Procedure Laterality Date   COLONOSCOPY WITH ESOPHAGOGASTRODUODENOSCOPY (EGD)  03/27/2022   one rectal polyp   LAPAROSCOPIC APPENDECTOMY  N/A 03/02/2022   Procedure: APPENDECTOMY LAPAROSCOPIC;  Surgeon: Manus Rudd, MD;  Location: [facility];  Service: General;  Laterality: N/A;   ROBOTIC ASSISTED LAPAROSCOPIC HYSTERECTOMY AND SALPINGECTOMY Bilateral 03/04/2023   Procedure: ABORTED XI ROBOTIC ASSISTED LAPAROSCOPIC HYSTERECTOMY AND SALPINGECTOMY;  Surgeon: Olivia Mackie, MD;  Location: [facility];  Service: Gynecology;  Laterality: Bilateral;  Requests 2/1/2 hrs.    Family History  Problem Relation Age of Onset   Breast cancer Mother    Cervical cancer Mother        ovarian   Bone cancer Mother    Breast cancer Maternal Grandmother  Colon cancer Maternal Great-grandmother 28   Esophageal cancer Neg Hx    Stomach cancer Neg Hx    Seizures Neg Hx    Stroke Neg Hx     Current Medications, Allergies, Family History and Social History were reviewed in [employer] record.     Current Outpatient Medications  Medication Sig Dispense Refill   acetaminophen (TYLENOL) 500 MG tablet Take 500 mg by mouth every 6 (six) hours as needed.     DULoxetine (CYMBALTA) 60 MG capsule TAKE 1 CAPSULE BY MOUTH EVERY DAY 90 capsule 0   ezetimibe (ZETIA) 10 MG tablet Take 1 tablet (10 mg total) by mouth daily. 90 tablet 3   hydrochlorothiazide (HYDRODIURIL) 25 MG tablet Take 1 tablet (25 mg total) by mouth daily as needed. 90 tablet 0   HYDROcodone-acetaminophen (NORCO/VICODIN) 5-325 MG tablet Take 1 tablet by mouth every 6 (six) hours as needed for moderate pain. 15 tablet 0   hydrOXYzine (VISTARIL) 25 MG capsule Take 1 capsule (25 mg total) by mouth every 8 (eight) hours as needed. 180 capsule 0   ibuprofen (ADVIL) 100 MG tablet Take 200 mg by mouth every 6 (six) hours as needed for fever.     KLOR-CON M20 20 MEQ tablet TAKE 2 TABLETS (40 MEQ TOTAL) BY MOUTH 3 (THREE) TIMES DAILY. 540 tablet 1   levETIRAcetam (KEPPRA) 750 MG tablet Take 750 mg by mouth 2 (two) times daily. 1 tablet twice a day      methocarbamol (ROBAXIN) 500 MG tablet TAKE 1 TABLET BY MOUTH UP TO EVERY 8 HOURS AS NEEDED FOR PAIN/MUSCLE SPASMS 70 tablet 0   metoCLOPramide (REGLAN) 10 MG tablet Take 1 tablet (10 mg total) by mouth every 8 (eight) hours as needed for nausea. 30 tablet 0   metoprolol succinate (TOPROL-XL) 25 MG 24 hr tablet TAKE 1 TABLET (25 MG TOTAL) BY MOUTH DAILY. 90 tablet 3   norethindrone (AYGESTIN) 5 MG tablet Take by mouth daily.     norgestimate-ethinyl estradiol (ORTHO-CYCLEN) 0.25-35 MG-MCG tablet Take 1 tablet by mouth daily.     omeprazole (PRILOSEC) 40 MG capsule Take 1 capsule (40 mg total) by mouth daily. 90 capsule 1   ondansetron (ZOFRAN-ODT) 4 MG disintegrating tablet TAKE 1 TABLET BY MOUTH EVERY 8 HOURS AS NEEDED FOR NAUSEA AND VOMITING 20 tablet 1   oxyCODONE (OXY IR/ROXICODONE) 5 MG immediate release tablet Take 5 mg by mouth every 4 (four) hours as needed for severe pain. 1-2 tablets every 4 hrs as needed.     promethazine (PHENERGAN) 25 MG tablet TAKE 1 TABLET (25 MG TOTAL) BY MOUTH DAILY. 90 tablet 0   spironolactone (ALDACTONE) 25 MG tablet Take 1 tablet (25 mg total) by mouth daily. 90 tablet 3   triamcinolone cream (KENALOG) 0.1 % Apply 1 Application topically 2 (two) times daily. (Patient taking differently: Apply 1 Application topically 2 (two) times daily. As needed) 30 g 0   Vitamin D, Ergocalciferol, (DRISDOL) 1.25 MG (50000 UNIT) CAPS capsule Take 1 capsule (50,000 Units total) by mouth every 7 (seven) days. 12 capsule 0   No current facility-administered medications for this visit.    Review of Systems: No chest pain. No shortness of breath. No urinary complaints.    Physical Exam  Filed Weights   07/30/23 1350  Weight: 229 lb 2 oz (103.9 kg)   Wt Readings from Last 3 Encounters:  07/30/23 229 lb 2 oz (103.9 kg)  07/02/23 225 lb 2 oz (102.1 kg)  06/27/23 228 lb 1.9 oz (103.5 kg)    BP 120/80 (BP Location: Left Arm, Patient Position: Sitting, Cuff Size: Large)    Pulse (!) 116   Ht 5\' 7"  (1.702 m)   Wt 229 lb 2 oz (103.9 kg)   BMI 35.89 kg/m  Constitutional:  Female in NAD Psychiatric: Normal mood and affect.  EENT: Pupils normal.  Conjunctivae are normal. No scleral icterus. Neck supple.  Cardiovascular: Normal rate, regular rhythm.  Pulmonary/chest: Effort normal and breath sounds normal. No wheezing, rales or rhonchi. Abdominal: Soft, nondistended, nontender. Bowel sounds active throughout. There are no masses palpable. No hepatomegaly. Neurological: Alert and oriented to person place and time.    Willette Cluster, NP  07/30/2023, 2:10 PM  Cc:  Jeani Sow, MD

## 2023-07-31 ENCOUNTER — Ambulatory Visit: Payer: Self-pay | Admitting: Family Medicine

## 2023-07-31 ENCOUNTER — Telehealth: Payer: Self-pay | Admitting: *Deleted

## 2023-07-31 NOTE — Telephone Encounter (Signed)
Left message to return call my call.

## 2023-07-31 NOTE — Telephone Encounter (Signed)
Copied from CRM [id number]. Topic: Clinical - Request for Lab/Test Order >> Jul 31, 2023  9:26 AM Fuller Mandril wrote: Reason for CRM: Pt called in needed to r/s appt due to conflict. States provider advised that if she was not improving she would need to come in and repeat labs in 3 wks. Pt requesting to come in prior to rescheduled appt which is 12/26 to repeat labs because she is not improving. Was not able to schedule.  Thank You.

## 2023-08-05 ENCOUNTER — Encounter (HOSPITAL_COMMUNITY): Payer: Self-pay

## 2023-08-07 ENCOUNTER — Ambulatory Visit (INDEPENDENT_AMBULATORY_CARE_PROVIDER_SITE_OTHER): Payer: Medicaid Other | Admitting: Family Medicine

## 2023-08-07 ENCOUNTER — Encounter: Payer: Self-pay | Admitting: Family Medicine

## 2023-08-07 VITALS — BP 113/80 | HR 98 | Temp 97.0°F | Resp 18 | Ht 67.0 in | Wt 230.2 lb

## 2023-08-07 DIAGNOSIS — R112 Nausea with vomiting, unspecified: Secondary | ICD-10-CM | POA: Diagnosis not present

## 2023-08-07 DIAGNOSIS — R600 Localized edema: Secondary | ICD-10-CM

## 2023-08-07 DIAGNOSIS — E876 Hypokalemia: Secondary | ICD-10-CM

## 2023-08-07 DIAGNOSIS — D508 Other iron deficiency anemias: Secondary | ICD-10-CM | POA: Diagnosis not present

## 2023-08-07 DIAGNOSIS — E538 Deficiency of other specified B group vitamins: Secondary | ICD-10-CM | POA: Diagnosis not present

## 2023-08-07 DIAGNOSIS — F4323 Adjustment disorder with mixed anxiety and depressed mood: Secondary | ICD-10-CM | POA: Diagnosis not present

## 2023-08-07 DIAGNOSIS — R7401 Elevation of levels of liver transaminase levels: Secondary | ICD-10-CM | POA: Diagnosis not present

## 2023-08-07 DIAGNOSIS — R1011 Right upper quadrant pain: Secondary | ICD-10-CM

## 2023-08-07 LAB — CBC WITH DIFFERENTIAL/PLATELET
Basophils Absolute: 0.1 10*3/uL (ref 0.0–0.1)
Basophils Relative: 0.8 % (ref 0.0–3.0)
Eosinophils Absolute: 0.1 10*3/uL (ref 0.0–0.7)
Eosinophils Relative: 1 % (ref 0.0–5.0)
HCT: 36.6 % (ref 36.0–46.0)
Hemoglobin: 12 g/dL (ref 12.0–15.0)
Lymphocytes Relative: 19.8 % (ref 12.0–46.0)
Lymphs Abs: 1.3 10*3/uL (ref 0.7–4.0)
MCHC: 32.9 g/dL (ref 30.0–36.0)
MCV: 109.1 fL — ABNORMAL HIGH (ref 78.0–100.0)
Monocytes Absolute: 0.4 10*3/uL (ref 0.1–1.0)
Monocytes Relative: 6.2 % (ref 3.0–12.0)
Neutro Abs: 4.9 10*3/uL (ref 1.4–7.7)
Neutrophils Relative %: 72.2 % (ref 43.0–77.0)
Platelets: 219 10*3/uL (ref 150.0–400.0)
RBC: 3.35 Mil/uL — ABNORMAL LOW (ref 3.87–5.11)
RDW: 15.7 % — ABNORMAL HIGH (ref 11.5–15.5)
WBC: 6.8 10*3/uL (ref 4.0–10.5)

## 2023-08-07 LAB — COMPREHENSIVE METABOLIC PANEL
ALT: 14 U/L (ref 0–35)
AST: 46 U/L — ABNORMAL HIGH (ref 0–37)
Albumin: 3.6 g/dL (ref 3.5–5.2)
Alkaline Phosphatase: 68 U/L (ref 39–117)
BUN: 6 mg/dL (ref 6–23)
CO2: 26 meq/L (ref 19–32)
Calcium: 8.4 mg/dL (ref 8.4–10.5)
Chloride: 103 meq/L (ref 96–112)
Creatinine, Ser: 0.61 mg/dL (ref 0.40–1.20)
GFR: 110.56 mL/min (ref >60.00)
Glucose, Bld: 111 mg/dL — ABNORMAL HIGH (ref 70–99)
Potassium: 3 meq/L — ABNORMAL LOW (ref 3.5–5.1)
Sodium: 141 meq/L (ref 135–145)
Total Bilirubin: 1.6 mg/dL — ABNORMAL HIGH (ref 0.2–1.2)
Total Protein: 6.5 g/dL (ref 6.0–8.3)

## 2023-08-07 LAB — MAGNESIUM: Magnesium: 1.1 mg/dL — ABNORMAL LOW (ref 1.5–2.5)

## 2023-08-07 MED ORDER — CYANOCOBALAMIN 1000 MCG/ML IJ SOLN
1000.0000 ug | Freq: Once | INTRAMUSCULAR | Status: AC
  Administered 2023-08-08: 1000 ug via INTRAMUSCULAR

## 2023-08-07 MED ORDER — PROMETHAZINE HCL 25 MG RE SUPP: 25.0000 mg | Freq: Four times a day (QID) | RECTAL | 3 refills | Status: AC | PRN

## 2023-08-07 NOTE — Assessment & Plan Note (Signed)
Chronic.  Pt was placed on ameloride d/t low K, but edema not controlled so pt restarted hydrochlorothiazide 25mg  daily.  On spironolactone as well.  Continue.  Monitor K closely-taking very large doses, but vomits frequently.

## 2023-08-07 NOTE — Progress Notes (Signed)
Subjective:     Patient ID: Janice Arnold, female    DOB: [birth year], 42 y.o.   MRN: [id number]  Chief Complaint  Patient presents with   Medical Management of Chronic Issues    4 week follow-up     HPI  Hypokalemia-gets freq n/v.  Not sure if just from that or other-seeing endocrine next month Cyclic vomiting-saw GI. will be doing scans-gastric emptying.  Vomited last 12/18-23.  4-6x/day. Trying to at least take K.   Moods-stable on duloxetine 60mg .  No SI.  Taking hydroxy bid or more and helps.  Pt feels unable to work/function on day to day.  May have a good week but then not Gets intermitt RUQ pain-sharp for few seconds to 5 min.  More when bends over.  Local edema-controlled as long as takes hydrochlorothiazide and spironolactone  Health Maintenance Due  Topic Date Due   MAMMOGRAM  04/29/2020    Past Medical History:  Diagnosis Date   Alcohol abuse    history of heavy alcohol abuse, last drank alcohol in 06/2022   Anemia 2023   s/p iron infusions   Anxiety    Follows with PCP. Dr. Dewitt Rota.   Compression fracture of body of thoracic vertebra (HCC) 11/05/2022   T4, T5, T6 / Follows w/ orthopedic, Dr. Willia Craze. Fractures occurred while patient was having a seizure, per patient.   Cyclic vomiting syndrome    hospital admission on 05/28/22 for vomiting, follows with gastroenterologist Dr. Tiajuana Amass.   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 Tiajuana Amass.   GERD (gastroesophageal reflux disease)    takes omeprazole daily   Heart murmur    since childhood   Hyperlipidemia    11/20/22 lipid panel in Epic   Hypokalemia 10/15/2022   K+ level 2.5   Menorrhagia    Palpitations    Follows w/ cardiology, Anice Paganini, NP. 11/14/2022 echocardiogram EF 60 65%, 12/31/22 Event heart monitor results in Epic.   PONV (postoperative  nausea and vomiting)    Prolonged QT interval    Follows w/ cardiology, Bernadene Person, NP.   Seizure (HCC) 06/06/2022   Follows with neurologist, Dr. Leim Fabry @ [facility]. Seizures were thought to be related to stopping alcohol. However, patient has had seizures months after quitting alcohol. As of 02/06/23, last seizure was on 11/07/22. Keppra was then increased to bid.   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 See 11/05/22 Head CT and MRI in Epic. Resolved per pt on 02/06/23.   Tremors of nervous system    whole body tremors, follows with Dr. Leim Fabry @ [facility].   Vitamin deficiency 2023   B12 deficiency, receiving monthly B12 injections / Vitamin D deficiency, taking supplement every two weeks   Wears contact lenses    Wears glasses     Past Surgical History:  Procedure Laterality Date   COLONOSCOPY WITH ESOPHAGOGASTRODUODENOSCOPY (EGD)  03/27/2022   one rectal polyp   LAPAROSCOPIC APPENDECTOMY N/A 03/02/2022   Procedure: APPENDECTOMY LAPAROSCOPIC;  Surgeon: Manus Rudd, MD;  Location: [facility];  Service: General;  Laterality: N/A;   ROBOTIC ASSISTED LAPAROSCOPIC HYSTERECTOMY AND SALPINGECTOMY Bilateral 03/04/2023   Procedure: ABORTED XI ROBOTIC ASSISTED LAPAROSCOPIC HYSTERECTOMY AND SALPINGECTOMY;  Surgeon: Olivia Mackie, MD;  Location: [facility];  Service: Gynecology;  Laterality: Bilateral;  Requests 2/1/2 hrs.     Current Outpatient Medications:    acetaminophen (TYLENOL) 500 MG tablet, Take 500 mg by mouth every 6 (six) hours as needed., Disp: , Rfl:    DULoxetine (CYMBALTA) 60 MG capsule, TAKE 1 CAPSULE BY MOUTH EVERY DAY, Disp: 90 capsule, Rfl: 0   ezetimibe (ZETIA) 10 MG tablet, Take 1 tablet (10 mg total) by mouth daily., Disp: 90 tablet, Rfl: 3   hydrochlorothiazide (HYDRODIURIL) 25 MG tablet, Take 1 tablet (25 mg total) by mouth daily as needed., Disp: 90  tablet, Rfl: 0   HYDROcodone-acetaminophen (NORCO/VICODIN) 5-325 MG tablet, Take 1 tablet by mouth every 6 (six) hours as needed for moderate pain., Disp: 15 tablet, Rfl: 0   hydrOXYzine (VISTARIL) 25 MG capsule, Take 1 capsule (25 mg total) by mouth every 8 (eight) hours as needed., Disp: 180 capsule, Rfl: 0   ibuprofen (ADVIL) 100 MG tablet, Take 200 mg by mouth every 6 (six) hours as needed for fever., Disp: , Rfl:    KLOR-CON M20 20 MEQ tablet, TAKE 2 TABLETS (40 MEQ TOTAL) BY MOUTH 3 (THREE) TIMES DAILY., Disp: 540 tablet, Rfl: 1   levETIRAcetam (KEPPRA) 750 MG tablet, Take 750 mg by mouth 2 (two) times daily. 1 tablet twice a day, Disp: , Rfl:    methocarbamol (ROBAXIN) 500 MG tablet, TAKE 1 TABLET BY MOUTH UP TO EVERY 8 HOURS AS NEEDED FOR PAIN/MUSCLE SPASMS, Disp: 70 tablet, Rfl: 0   metoCLOPramide (REGLAN) 10 MG tablet, Take 1 tablet (10 mg total) by mouth every 8 (eight) hours as needed for nausea., Disp: 30 tablet, Rfl: 0   metoprolol succinate (TOPROL-XL) 25 MG 24 hr tablet, TAKE 1 TABLET (25 MG TOTAL) BY MOUTH DAILY., Disp: 90 tablet, Rfl: 3   norethindrone (AYGESTIN) 5 MG tablet, Take by mouth daily., Disp: , Rfl:    norgestimate-ethinyl estradiol (ORTHO-CYCLEN) 0.25-35 MG-MCG tablet, Take 1 tablet by mouth daily., Disp: , Rfl:    omeprazole (PRILOSEC) 40 MG capsule, Take 1 capsule (40 mg total) by mouth daily., Disp: 90 capsule, Rfl: 1   ondansetron (ZOFRAN-ODT) 4 MG disintegrating tablet, TAKE 1 TABLET BY MOUTH EVERY 8 HOURS AS NEEDED FOR NAUSEA AND VOMITING, Disp: 20 tablet, Rfl: 1   oxyCODONE (OXY IR/ROXICODONE) 5 MG immediate release tablet, Take 5 mg by mouth every 4 (four) hours as needed for severe pain. 1-2 tablets every 4 hrs as needed., Disp: , Rfl:    promethazine (PHENERGAN) 25 MG suppository, Place 1 suppository (25 mg total) rectally every 6 (six) hours as needed for nausea or vomiting., Disp: 12 each, Rfl: 3   promethazine (PHENERGAN) 25 MG tablet, TAKE 1 TABLET (25 MG  TOTAL) BY MOUTH DAILY., Disp: 90 tablet, Rfl: 0   spironolactone (ALDACTONE) 25 MG tablet, Take 1 tablet (25 mg total) by mouth daily., Disp: 90 tablet, Rfl: 3   triamcinolone cream (KENALOG) 0.1 %, Apply 1 Application topically 2 (two) times daily. (Patient taking differently: Apply 1 Application topically 2 (two) times daily. As needed), Disp: 30 g, Rfl: 0   Vitamin D, Ergocalciferol, (DRISDOL) 1.25 MG (50000 UNIT) CAPS capsule, Take 1 capsule (50,000 Units total) by mouth every 7 (seven) days., Disp: 12 capsule, Rfl: 0  Current Facility-Administered Medications:    cyanocobalamin (VITAMIN B12) injection 1,000 mcg, 1,000 mcg, Intramuscular, Once, Jeani Sow, MD  No Known Allergies ROS neg/noncontributory except as noted HPI/below      Objective:  BP 113/80   Pulse 98   Temp (!) 97 F (36.1 C) (Temporal)   Resp 18   Ht 5\' 7"  (1.702 m)   Wt 230 lb 4 oz (104.4 kg)   LMP 01/29/2023 (Approximate)   SpO2 98%   BMI 36.06 kg/m  Wt Readings from Last 3 Encounters:  08/07/23 230 lb 4 oz (104.4 kg)  07/30/23 229 lb 2 oz (103.9 kg)  07/02/23 225 lb 2 oz (102.1 kg)    Physical Exam   Gen: [facility] NAD.  ?looks cushingoid vs wt HEENT: NCAT, conjunctiva not injected, sclera nonicteric NECK:  supple, no thyromegaly, no nodes, no carotid bruits CARDIAC: RRR, S1S2+, no murmur. DP 2+B LUNGS: CTAB. No wheezes ABDOMEN:  BS+, soft, sl tender RUQ, No HSM, no masses EXT:  no edema MSK: no gross abnormalities.  NEURO: A&O x3.  CN II-XII intact.  PSYCH: normal mood. Good eye contact     Assessment & Plan:  Intractable nausea and vomiting Assessment & Plan: Chronic. Etiology unclear.  No ETOH >1 yr.  No marijuana.  Saw GI   getting gastric emptying study.  ?psychogenic.  GI note mentioned TCA-needs to f/u Card to clear if really has prolonged QT or dx from electroloyte disturbance.  Will do rectal phenergan for uncontrolled vomiting to make sure getting meds in.     Transaminitis Assessment & Plan: ? Fatty liver, ? Cyclic vomiting, other.  Will monitor closely.  Has had w/u.  Will reck u/s liver  Orders: -     Comprehensive metabolic panel  Other iron deficiency anemia Assessment & Plan: Chronic.  DUB.  Care per heme.  Was supposed to get hyst, but then sz, etc.  Awaiting to see gyn.   Orders: -     CBC with Differential/Platelet  Hypokalemia Assessment & Plan: Chronic.  Taking large doses K.  Vomits frequently as well.  Will see endo.  Not sure if some other disorder.  Orders: -     Comprehensive metabolic panel -     Magnesium  RUQ pain -     US ABDOMEN LIMITED RUQ (LIVER/GB); Future  B12 deficiency Assessment & Plan: Chronic  gets monthly injections  Orders: -     Cyanocobalamin  Adjustment disorder with mixed anxiety and depressed mood Assessment & Plan: Chronic.  Mostly controlled.  Continue duloxetine 60mg  daily and hydroxyzine 25mg  bid   Local edema Assessment & Plan: Chronic.  Pt was placed on ameloride d/t low K, but edema not controlled so pt restarted hydrochlorothiazide 25mg  daily.  On spironolactone as well.  Continue.  Monitor K closely-taking very large doses, but vomits frequently.     Other orders -     Promethazine HCl; Place 1 suppository (25 mg total) rectally every 6 (six) hours as needed for nausea or vomiting.  Dispense: 12 each; Refill: 3  Pt needs to f/u on visits/referrals  Return in about 4 weeks (around 09/04/2023) for chronic follow-up.  Angelena Sole, MD

## 2023-08-07 NOTE — Assessment & Plan Note (Signed)
Chronic  gets monthly injections

## 2023-08-07 NOTE — Assessment & Plan Note (Signed)
Chronic.  DUB.  Care per heme.  Was supposed to get hyst, but then sz, etc.  Awaiting to see gyn.

## 2023-08-07 NOTE — Assessment & Plan Note (Signed)
?   Fatty liver, ? Cyclic vomiting, other.  Will monitor closely.  Has had w/u.  Will reck u/s liver

## 2023-08-07 NOTE — Progress Notes (Signed)
Magnesium is low-needs to take 400mg  daily-bid Potassium is low-needs to take it!.  Repeat bmp in 1 wk

## 2023-08-07 NOTE — Assessment & Plan Note (Signed)
Chronic. Etiology unclear.  No ETOH >1 yr.  No marijuana.  Saw GI   getting gastric emptying study.  ?psychogenic.  GI note mentioned TCA-needs to f/u Card to clear if really has prolonged QT or dx from electroloyte disturbance.  Will do rectal phenergan for uncontrolled vomiting to make sure getting meds in.

## 2023-08-07 NOTE — Assessment & Plan Note (Signed)
Chronic.  Taking large doses K.  Vomits frequently as well.  Will see endo.  Not sure if some other disorder.

## 2023-08-07 NOTE — Patient Instructions (Signed)
It was very nice to see you today!  Happy New Year!   PLEASE NOTE:  If you had any lab tests please let us know if you have not heard back within a few days. You may see your results on MyChart before we have a chance to review them but we will give you a call once they are reviewed by us. If we ordered any referrals today, please let us know if you have not heard from their office within the next week.   Please try these tips to maintain a healthy lifestyle:  Eat most of your calories during the day when you are active. Eliminate processed foods including packaged sweets (pies, cakes, cookies), reduce intake of potatoes, white bread, white pasta, and white rice. Look for whole grain options, oat flour or almond flour.  Each meal should contain half fruits/vegetables, one quarter protein, and one quarter carbs (no bigger than a computer mouse).  Cut down on sweet beverages. This includes juice, soda, and sweet tea. Also watch fruit intake, though this is a healthier sweet option, it still contains natural sugar! Limit to 3 servings daily.  Drink at least 1 glass of water with each meal and aim for at least 8 glasses per day  Exercise at least 150 minutes every week.   

## 2023-08-07 NOTE — Assessment & Plan Note (Signed)
Chronic.  Mostly controlled.  Continue duloxetine 60mg  daily and hydroxyzine 25mg  bid

## 2023-08-08 ENCOUNTER — Ambulatory Visit (HOSPITAL_COMMUNITY)
Admission: RE | Admit: 2023-08-08 | Discharge: 2023-08-08 | Disposition: A | Discharge status: Home / Self Care | Payer: Medicaid Other | Source: Ambulatory Visit | Attending: Nurse Practitioner | Admitting: Nurse Practitioner

## 2023-08-08 ENCOUNTER — Other Ambulatory Visit: Payer: Self-pay | Admitting: *Deleted

## 2023-08-08 DIAGNOSIS — R112 Nausea with vomiting, unspecified: Secondary | ICD-10-CM | POA: Diagnosis not present

## 2023-08-08 DIAGNOSIS — E876 Hypokalemia: Secondary | ICD-10-CM

## 2023-08-08 MED ORDER — TECHNETIUM TC 99M SULFUR COLLOID
2.0000 | Freq: Once | INTRAVENOUS | Status: AC | PRN
  Administered 2023-08-08: 2 via INTRAVENOUS

## 2023-08-11 ENCOUNTER — Ambulatory Visit
Admission: RE | Admit: 2023-08-11 | Discharge: 2023-08-11 | Disposition: A | Discharge status: Home / Self Care | Payer: Self-pay | Source: Ambulatory Visit | Attending: Family Medicine | Admitting: Family Medicine

## 2023-08-11 DIAGNOSIS — R1011 Right upper quadrant pain: Secondary | ICD-10-CM

## 2023-08-12 NOTE — Progress Notes (Signed)
Agree with the assessment and plan as outlined by Hyacinth Meeker, PA-C.  Underlying alcohol use also possible contributor to nausea/vomiting.  Patient's recent labs suggest ongoing alcohol use (macrocytosis, AST elevation).  Would consider disability paperwork with close outpatient follow up and demonstration of abstinence confirmed with PETH testing.  Kelvon Giannini E. Tomasa Rand, MD

## 2023-08-13 DIAGNOSIS — Z419 Encounter for procedure for purposes other than remedying health state, unspecified: Secondary | ICD-10-CM | POA: Diagnosis not present

## 2023-08-14 ENCOUNTER — Ambulatory Visit: Payer: Self-pay | Admitting: Orthopedic Surgery

## 2023-08-14 ENCOUNTER — Other Ambulatory Visit (INDEPENDENT_AMBULATORY_CARE_PROVIDER_SITE_OTHER): Payer: Medicaid Other

## 2023-08-14 DIAGNOSIS — E876 Hypokalemia: Secondary | ICD-10-CM

## 2023-08-14 LAB — BASIC METABOLIC PANEL
BUN: 7 mg/dL (ref 6–23)
CO2: 25 meq/L (ref 19–32)
Calcium: 8.7 mg/dL (ref 8.4–10.5)
Chloride: 104 meq/L (ref 96–112)
Creatinine, Ser: 0.55 mg/dL (ref 0.40–1.20)
GFR: 113.33 mL/min (ref >60.00)
Glucose, Bld: 89 mg/dL (ref 70–99)
Potassium: 3.3 meq/L — ABNORMAL LOW (ref 3.5–5.1)
Sodium: 141 meq/L (ref 135–145)

## 2023-08-14 NOTE — Progress Notes (Signed)
 Potassium still low.  Add an additional potassium and increase spironolactone to 2/day.  Needs to stop the hydrochlorothiazide or try 1/2 since increasing the spironolactone.  Is she vomiting? Repeat bmp next wk

## 2023-08-15 ENCOUNTER — Telehealth: Payer: Self-pay | Admitting: *Deleted

## 2023-08-15 ENCOUNTER — Other Ambulatory Visit: Payer: Self-pay | Admitting: *Deleted

## 2023-08-15 DIAGNOSIS — E876 Hypokalemia: Secondary | ICD-10-CM

## 2023-08-15 NOTE — Telephone Encounter (Signed)
 Dr. Tomasa Rand,  Please let me know what medication to order for this patient please.

## 2023-08-15 NOTE — Telephone Encounter (Signed)
-----   Message from Glendia FORBES Holt sent at 08/14/2023  5:14 PM EST ----- I would recommend a TCA, start 10 mg PO at bedtime #30 rf1.    Merlynn, can you book a follow up visit with me? ----- Message ----- From: Kerman Vina HERO, NP Sent: 08/08/2023   5:56 PM EST To: Merlynn Screws, RN; Glendia FORBES Holt, MD  Merlynn,  Please let Courtlyn know that gastric emptying study really did not show that her stomach had a problem emptying.  Reason for her cyclic nausea and vomiting is unclear at this point.  I will pass this along to her primary GI Dr. Holt to see if he thinks starting a TCA may be helpful for her

## 2023-08-15 NOTE — Telephone Encounter (Signed)
-----   Message from Glendia FORBES Holt sent at 08/14/2023  5:14 PM EST ----- I would recommend a TCA, start 10 mg PO at bedtime #30 rf1.    Merlynn, can you book a follow up visit with me? ----- Message ----- From: Kerman Vina HERO, NP Sent: 08/08/2023   5:56 PM EST To: Merlynn Screws, RN; Glendia FORBES Holt, MD  Merlynn,  Please let Janice Arnold know that gastric emptying study really did not show that her stomach had a problem emptying.  Reason for her cyclic nausea and vomiting is unclear at this point.  I will pass this along to her primary GI Dr. Holt to see if he thinks starting a TCA may be helpful for her

## 2023-08-15 NOTE — Telephone Encounter (Signed)
 Notified Dr. Stacia the nurse needs to know the name of the TCA medication he wants to order for the patient at bedtime. Sent a message to Dr. Stacia and received a message stating the medication will be noted with the patient's visit on 08/19/2023. Patient called and notified.

## 2023-08-18 ENCOUNTER — Ambulatory Visit (INDEPENDENT_AMBULATORY_CARE_PROVIDER_SITE_OTHER): Payer: Medicaid Other | Admitting: Endocrinology

## 2023-08-18 ENCOUNTER — Other Ambulatory Visit (INDEPENDENT_AMBULATORY_CARE_PROVIDER_SITE_OTHER): Payer: Self-pay

## 2023-08-18 ENCOUNTER — Ambulatory Visit (INDEPENDENT_AMBULATORY_CARE_PROVIDER_SITE_OTHER): Payer: Medicaid Other | Admitting: Orthopedic Surgery

## 2023-08-18 VITALS — BP 122/70 | HR 104 | Resp 16 | Ht 67.0 in | Wt 236.4 lb

## 2023-08-18 DIAGNOSIS — M542 Cervicalgia: Secondary | ICD-10-CM

## 2023-08-18 DIAGNOSIS — M545 Low back pain, unspecified: Secondary | ICD-10-CM | POA: Diagnosis not present

## 2023-08-18 DIAGNOSIS — I10 Essential (primary) hypertension: Secondary | ICD-10-CM

## 2023-08-18 DIAGNOSIS — E876 Hypokalemia: Secondary | ICD-10-CM | POA: Diagnosis not present

## 2023-08-18 MED ORDER — LIDOCAINE 5 % EX PTCH: 1.0000 | MEDICATED_PATCH | CUTANEOUS | 0 refills | Status: DC

## 2023-08-18 MED ORDER — METHOCARBAMOL 500 MG PO TABS: 500.0000 mg | ORAL_TABLET | Freq: Three times a day (TID) | ORAL | 1 refills | Status: DC | PRN

## 2023-08-18 NOTE — Progress Notes (Addendum)
 Outpatient Endocrinology Note Janice Heffern, MD   Patient's Name: Janice Arnold    DOB: Dec 22, 1980    MRN: [id number]  REASON OF VISIT: New consult for hypokalemia  REFERRING PROVIDER: Wendolyn Jenkins Jansky, MD   PCP: Wendolyn Jenkins Jansky, MD  HISTORY OF PRESENT ILLNESS:   Janice Arnold is a 43 y.o. old female with past medical history listed below, is here for new consult of hypokalemia.  Pertinent  History: Patient has intermittent hypokalemia and has been on potassium supplement.  Patient is referred to endocrinology for evaluation and management.  Patient denies having history of hypertension.  Denies hospitalization or ER visit due to elevated blood pressure.  She has been on hydrochlorothiazide  for pedal edema however she denies taking regularly she takes intermittently as needed may take about 5 times a month.  She had taken hydrochlorothiazide  regularly in the past.  She was started on spironolactone  in May 2024.  She was on amiloride  in the past at some point as well.  Patient reports she was not taking care of the health in the past and has been better taking care of her health for about a year.  Patient has history of cyclical vomiting.  She has a history of alcohol abuse, quit in 2023.  Hypokalemia was initially thought to be related with cyclical vomiting later thought to be related with hydrochlorothiazide .  She continued to have hypokalemia requiring potassium chloride  40mEq 3 times a day.  Lately patient reports she has only been taking spironolactone  25 mg daily.  However she is not sure about which medication she is taking at this time.  She denies taking hydrochlorothiazide  regularly, last time she had taken was around beginning of December.  She does not take amiloride  anymore.  Based on PCP office note in December 26 patient was taking spironolactone  and hydrochlorothiazide .  With a review of chart patient was noted to have low potassium, first in June 2023.  She has  intermittently low serum potassium as low as 2.1, mostly in the range of 3.0-3.3.  Some of other times normal serum potassium.  She has been taking potassium chloride  40 mEq 3 times daily.  She reports compliance.  CT abdomen in July 2023 with normal adrenal glands.  Blood pressure is normal today.  REVIEW OF SYSTEMS:  As per history of present illness.   PAST MEDICAL HISTORY: Past Medical History:  Diagnosis Date   Alcohol abuse    history of heavy alcohol abuse, last drank alcohol in 06/2022   Anemia 2023   s/p iron  infusions   Anxiety    Follows with PCP. Dr. Jenkins Fredrickson.   Compression fracture of body of thoracic vertebra (HCC) 11/05/2022   T4, T5, T6 / Follows w/ orthopedic, Dr. Ozell Ada. Fractures occurred while patient was having a seizure, per patient.   Cyclic vomiting syndrome    hospital admission on 05/28/22 for vomiting, follows with gastroenterologist Dr. Glendia Holt.   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 Glendia Holt.   GERD (gastroesophageal reflux disease)    takes omeprazole  daily   Heart murmur    since childhood   Hyperlipidemia    11/20/22 lipid panel in Epic   Hypokalemia 10/15/2022   K+ level 2.5   Menorrhagia    Palpitations    Follows w/ cardiology, Damien Boos, NP. 11/14/2022 echocardiogram  EF 60 65%, 12/31/22 Event heart monitor results in Epic.   PONV (postoperative nausea and vomiting)    Prolonged QT interval    Follows w/ cardiology, Damien Braver, NP.   Seizure (HCC) 06/06/2022   Follows with neurologist, Dr. Hulan Falling @ [facility]. Seizures were thought to be related to stopping alcohol. However, patient has had seizures months after quitting alcohol. As of 02/06/23, last seizure was on 11/07/22. Keppra  was then increased to bid.   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 See 11/05/22 Head CT and MRI in Epic. Resolved per pt on 02/06/23.   Tremors of nervous system    whole body tremors, follows with Dr. Hulan Falling @ [facility].   Vitamin deficiency 2023   B12 deficiency, receiving monthly B12 injections / Vitamin D  deficiency, taking supplement every two weeks   Wears contact lenses    Wears glasses     PAST SURGICAL HISTORY: Past Surgical History:  Procedure Laterality Date   COLONOSCOPY WITH ESOPHAGOGASTRODUODENOSCOPY (EGD)  03/27/2022   one rectal polyp   LAPAROSCOPIC APPENDECTOMY N/A 03/02/2022   Procedure: APPENDECTOMY LAPAROSCOPIC;  Surgeon: Belinda Cough, MD;  Location: [facility];  Service: General;  Laterality: N/A;   ROBOTIC ASSISTED LAPAROSCOPIC HYSTERECTOMY AND SALPINGECTOMY Bilateral 03/04/2023   Procedure: ABORTED XI ROBOTIC ASSISTED LAPAROSCOPIC HYSTERECTOMY AND SALPINGECTOMY;  Surgeon: Gorge Ade, MD;  Location: [facility];  Service: Gynecology;  Laterality: Bilateral;  Requests 2/1/2 hrs.    ALLERGIES: No Known Allergies  FAMILY HISTORY:  Family History  Problem Relation Age of Onset   Breast cancer Mother    Cervical cancer Mother        ovarian   Bone cancer Mother    Breast cancer Maternal Grandmother    Colon cancer Maternal Great-grandmother 34   Esophageal cancer Neg Hx    Stomach cancer Neg Hx    Seizures Neg Hx    Stroke Neg Hx     SOCIAL HISTORY: Social History   Socioeconomic History   Marital status: Married    Spouse name: Not on file   Number of children: 3   Years of education: Not on file   Highest education level: Associate degree: occupational, scientist, product/process development, or vocational program  Occupational History   Occupation: Emergency Planning/management Officer  Tobacco Use   Smoking status: Never   Smokeless tobacco: Never  Vaping Use   Vaping status: Never Used  Substance and Sexual Activity   Alcohol use: Not Currently    Comment: History of  heavy alcohol abuse. Quit in 2023. Last drank alcohol in 06/2022.   Drug use: Never   Sexual activity: Yes    Birth control/protection: Pill  Other Topics Concern   Not on file  Social History Narrative   Project mgr   Social Drivers of Health   Financial Resource Strain: Low Risk  (11/12/2022)   Overall Financial Resource Strain (CARDIA)    Difficulty of Paying Living Expenses: Not very hard  Food Insecurity: Food Insecurity Present (11/12/2022)   Hunger Vital Sign    Worried About Running Out of Food in the Last Year: Sometimes true    Ran Out of Food in the Last Year: Sometimes true  Transportation Needs: No Transportation Needs (11/12/2022)   PRAPARE - Administrator, Civil Service (Medical): No    Lack of Transportation (Non-Medical): No  Physical Activity: Unknown (11/12/2022)   Exercise Vital Sign  Days of Exercise per Week: 0 days    Minutes of Exercise per Session: Not on file  Stress: Stress Concern Present (11/12/2022)   [employer] of Occupational Health - Occupational Stress Questionnaire    Feeling of Stress : Rather much  Social Connections: Socially Isolated (11/12/2022)   Social Connection and Isolation Panel [NHANES]    Frequency of Communication with Friends and Family: Once a week    Frequency of Social Gatherings with Friends and Family: Once a week    Attends Religious Services: Never    Database Administrator or Organizations: No    Attends Engineer, Structural: Not on file    Marital Status: Married    MEDICATIONS:  Current Outpatient Medications  Medication Sig Dispense Refill   acetaminophen  (TYLENOL ) 500 MG tablet Take 500 mg by mouth every 6 (six) hours as needed.     DULoxetine  (CYMBALTA ) 60 MG capsule TAKE 1 CAPSULE BY MOUTH EVERY DAY 90 capsule 0   ezetimibe  (ZETIA ) 10 MG tablet Take 1 tablet (10 mg total) by mouth daily. 90 tablet 3   hydrochlorothiazide  (HYDRODIURIL ) 25 MG tablet Take 1 tablet (25 mg total) by mouth daily  as needed. 90 tablet 0   HYDROcodone -acetaminophen  (NORCO/VICODIN) 5-325 MG tablet Take 1 tablet by mouth every 6 (six) hours as needed for moderate pain. 15 tablet 0   hydrOXYzine  (VISTARIL ) 25 MG capsule Take 1 capsule (25 mg total) by mouth every 8 (eight) hours as needed. 180 capsule 0   ibuprofen  (ADVIL ) 100 MG tablet Take 200 mg by mouth every 6 (six) hours as needed for fever.     KLOR-CON  M20 20 MEQ tablet TAKE 2 TABLETS (40 MEQ TOTAL) BY MOUTH 3 (THREE) TIMES DAILY. 540 tablet 1   levETIRAcetam  (KEPPRA ) 750 MG tablet Take 750 mg by mouth 2 (two) times daily. 1 tablet twice a day     metoCLOPramide  (REGLAN ) 10 MG tablet Take 1 tablet (10 mg total) by mouth every 8 (eight) hours as needed for nausea. 30 tablet 0   metoprolol  succinate (TOPROL -XL) 25 MG 24 hr tablet TAKE 1 TABLET (25 MG TOTAL) BY MOUTH DAILY. 90 tablet 3   norethindrone  (AYGESTIN ) 5 MG tablet Take by mouth daily.     norgestimate-ethinyl estradiol (ORTHO-CYCLEN) 0.25-35 MG-MCG tablet Take 1 tablet by mouth daily.     omeprazole  (PRILOSEC) 40 MG capsule Take 1 capsule (40 mg total) by mouth daily. 90 capsule 1   ondansetron  (ZOFRAN -ODT) 4 MG disintegrating tablet TAKE 1 TABLET BY MOUTH EVERY 8 HOURS AS NEEDED FOR NAUSEA AND VOMITING 20 tablet 1   oxyCODONE  (OXY IR/ROXICODONE ) 5 MG immediate release tablet Take 5 mg by mouth every 4 (four) hours as needed for severe pain. 1-2 tablets every 4 hrs as needed.     promethazine  (PHENERGAN ) 25 MG suppository Place 1 suppository (25 mg total) rectally every 6 (six) hours as needed for nausea or vomiting. 12 each 3   promethazine  (PHENERGAN ) 25 MG tablet TAKE 1 TABLET (25 MG TOTAL) BY MOUTH DAILY. 90 tablet 0   spironolactone  (ALDACTONE ) 25 MG tablet Take 1 tablet (25 mg total) by mouth daily. 90 tablet 3   triamcinolone  cream (KENALOG ) 0.1 % Apply 1 Application topically 2 (two) times daily. (Patient taking differently: Apply 1 Application topically 2 (two) times daily. As needed) 30 g  0   Vitamin D , Ergocalciferol , (DRISDOL ) 1.25 MG (50000 UNIT) CAPS capsule Take 1 capsule (50,000 Units total) by mouth every 7 (seven) days.  12 capsule 0   lidocaine  (LIDODERM ) 5 % Place 1 patch onto the skin daily. Remove & Discard patch within 12 hours or as directed by MD 30 patch 0   methocarbamol  (ROBAXIN ) 500 MG tablet Take 1 tablet (500 mg total) by mouth every 8 (eight) hours as needed (pain muscle spasms). 120 tablet 1   No current facility-administered medications for this visit.    PHYSICAL EXAM: Vitals:   08/18/23 1501  BP: 122/70  Pulse: (!) 104  Resp: 16  SpO2: 97%  Weight: 236 lb 6.4 oz (107.2 kg)  Height: 5' 7 (1.702 m)   Body mass index is 37.03 kg/m.  Wt Readings from Last 3 Encounters:  08/18/23 236 lb 6.4 oz (107.2 kg)  08/07/23 230 lb 4 oz (104.4 kg)  07/30/23 229 lb 2 oz (103.9 kg)    General: Well developed, well nourished female in no apparent distress.  HEENT: AT/[place], no external lesions. Hearing intact to the spoken word Eyes: EOMI. Conjunctiva clear and no icterus. Neck: Trachea midline, neck supple without appreciable thyromegaly or lymphadenopathy and no palpable thyroid  nodules Lungs: Clear to auscultation, no wheeze. Respirations not labored Heart: S1S2, Regular  rhythm. Abdomen: Soft, non tender, non distended, no striae.  Stretch mark present. Neurologic: Alert, oriented, normal speech, deep tendon biceps reflexes normal,  no gross focal neurological deficit Extremities: Trace pedal pitting edema, no tremors of outstretched hands Skin: Warm, color good.   PERTINENT HISTORIC LABORATORY AND IMAGING STUDIES:  All pertinent laboratory results were reviewed. Please see HPI for further details.  Lab Results  Component Value Date   CO2 24 08/18/2023   CL 107 08/18/2023   NA 143 08/18/2023   GLUCOSE 90 08/18/2023   BUN 7 08/18/2023   No components found for: CORTRAND, CORTISOL TOTAL AM, ALDOSTERONE, RENIN ACTIVITY,  DEHYDROEPIANDROSTERONE SULFATE, CATECHOLAMINES FRACTIONATED    ASSESSMENT / PLAN  1. Hypokalemia   2. Hypertension, unspecified type    Patient has intermittent hypokalemia on potassium supplement.  Patient denies having history of hypertension.  Patient has been on antihypertensive medication in the form of spironolactone , ?  Hydrochlorothiazide .  These medications were started for hypokalemia and pedal edema respectively.  Patient did not have elevated blood pressure requiring ER visit or hospitalization in the past.  Unclear reason for hypokalemia, could be multifactorial including ? Vomiting, diuretic use ?  Hydrochlorothiazide .  Less likely to be related to it hyperaldosteronism as she does not have obvious hypertension.  She had normal adrenal glands and CT abdomen in 2023.  I would like to check the following lab to check for hyperaldosteronism.  She is currently taking potassium chloride  40 mEq 3 times a day.  Plan: -Check renin and aldosterone. -Check BMP. -Will interpret lab results in the context of she has been taking spironolactone .  Diagnoses and all orders for this visit:  Hypokalemia -     BASIC METABOLIC PANEL WITH GFR -     Aldosterone -     Renin  Hypertension, unspecified type -     BASIC METABOLIC PANEL WITH GFR -     Aldosterone -     Renin    DISPOSITION Follow up in clinic in to be determined based on above lab results.  All questions answered and patient verbalized understanding of the plan.  Wynette Jersey, MD [facility] Endocrinology [facility] [address] [place], [address] [place], [place] [address] Phone # [phone]  At least part of this note was generated using voice  recognition software. Inadvertent word errors may have occurred, which were not recognized during the proofreading process.   Addendum:  Labs reviewed normal aldosterone and renin activity, negative result for hyperaldosteronism.  Electrolytes, renal function and  serum potassium normal.  No routine endocrinology follow-up is required at this time.  Continue to follow-up with primary care provider.  Encouraged to call our clinic with any questions.   Latest Reference Range & Units 08/18/23 16:44  ALDOSTERONE  ng/dL 3  Renin Activity 9.74 - 5.82 ng/mL/h 0.31     Latest Reference Range & Units 08/18/23 16:44  Sodium 135 - 146 mmol/L 143  Potassium 3.5 - 5.3 mmol/L 3.9  Chloride 98 - 110 mmol/L 107  CO2 20 - 32 mmol/L 24  Glucose 65 - 99 mg/dL 90  BUN 7 - 25 mg/dL 7  Creatinine 9.49 - 9.00 mg/dL 9.46  Calcium 8.6 - 89.7 mg/dL 8.9  BUN/Creatinine Ratio 6 - 22 (calc) SEE NOTE:  eGFR > OR = 60 mL/min/1.33m2 118    Latest Reference Range & Units 08/18/23 16:44  Glucose 65 - 99 mg/dL 90

## 2023-08-18 NOTE — Progress Notes (Signed)
 Orthopedic Spine Surgery Office Note     Assessment: Patient is a 43 y.o. female with 3 issues:   1) thoracic back pain from compression fractures at T4, T5, T6 that has improved but is still present (date of injury 11/05/2022, 10 months from injury) 2) persistent low back lain in spine of conservative treatments. No radicular symptoms 3) caudal cervical spine pain that has recently developed - not as significant as her low back pain, no radicular symptoms or symptoms of myelopathy     Plan: -Patient has been using Robaxin . Has found robaxin  helpful, so new prescription provided to her today -Prescribed lidocaine  patches as well -Ordered an MRI of her lumbar spine to evaluate further -Patient should return to office in 4 weeks, x-rays at next visit: None     Patient expressed understanding of the plan and all questions were answered to the patient's satisfaction.    ___________________________________________________________________________   History: Patient is a 43 y.o. female who has been previously seen in the office for thoracic back pain.  Her thoracic back pain is similar to last time she was seen.  It is improved significantly since when she was first seen but is still present.  She does not have any pain radiating into the chest wall.    In regards to her low back, she still significant pain in the lower lumbar region.  She points to the area around the belt line.  She does not have any pain radiating into either lower extremity.  She has not noticed any relief with the Robaxin  or PT that was tried. She finds that laying down for 30 minutes every 3 hours or so is helpful.  She has worsening pain if she sits for longer than 3 hours.  She also has worsening pain if she lifts anything more than 15 pounds.  Finally, she has developed pain in her caudal cervical spine that started a couple of weeks ago. She does not have any pain radiating into either upper extremity. It is not as  significant as her low back pain. No trouble with fine motor skills in the hands or unsteadiness/imbalance. No bowel or bladder incontinence.    Previous treatments: robaxin , PT, medrol  dosepak, tylenol , salonpas     Physical Exam:   General: no acute distress, appears stated age Neurologic: alert, answering questions appropriately, following commands Respiratory: unlabored breathing on room air, symmetric chest rise Psychiatric: appropriate affect, normal cadence to speech     MSK (spine):   -Strength exam                                                   Left                  Right Grip strength                5/5  5/5 Interosseus   5/5   5/5 Wrist extension  5/5  5/5 Wrist flexion   5/5  5/5 Elbow flexion   5/5  5/5 Deltoid    5/5  5/5  EHL                              5/5  5/5 TA                                 5/5                  5/5 GSC                             5/5                  5/5 Knee extension            5/5                  5/5 Hip flexion                    5/5                  5/5   -Sensory exam                Sensation intact to light touch in C5-T1 nerve distributions of bilateral upper extremities             Sensation intact to light touch in L3-S1 nerve distributions of bilateral lower extremities     Imaging: XRs of the thoracic spine from 07/02/2023 were previously independently reviewed and interpreted, showing 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  No change in height loss since prior films.  No other fracture seen.  No dislocation seen.  No significant degenerative changes seen.    MRI of the thoracic spine from 01/08/2023 was previously independently reviewed and interpreted, showing some increased T2 and STIR signal within the T6 level.  No other significant increased signal intensity within the thoracic vertebra.  No new fracture seen.  No retropulsion or canal stenosis seen.   XRs of the lumbar spine from 07/02/2023 were  previously independently reviewed and interpreted,showing disc height loss at L4/5.  No other significant degenerative changes seen.  No fracture or dislocation seen.  No evidence of instability on flexion/extension views.  XRs of the cervical spine from 08/18/2023 were independently reviewed and interpreted, showing no fracture or dislocation. No significant degenerative changes seen. No evidence of instability on flexion/extension views.      Patient name: Janice Arnold Patient MRN: [id number] Date of visit: 08/18/23

## 2023-08-19 ENCOUNTER — Encounter: Payer: Self-pay | Admitting: Gastroenterology

## 2023-08-19 ENCOUNTER — Encounter: Payer: Self-pay | Admitting: Endocrinology

## 2023-08-19 ENCOUNTER — Ambulatory Visit (INDEPENDENT_AMBULATORY_CARE_PROVIDER_SITE_OTHER): Payer: Self-pay | Admitting: Gastroenterology

## 2023-08-19 VITALS — BP 132/88 | HR 88 | Ht 67.0 in | Wt 234.2 lb

## 2023-08-19 DIAGNOSIS — D509 Iron deficiency anemia, unspecified: Secondary | ICD-10-CM

## 2023-08-19 DIAGNOSIS — D649 Anemia, unspecified: Secondary | ICD-10-CM | POA: Diagnosis not present

## 2023-08-19 DIAGNOSIS — R195 Other fecal abnormalities: Secondary | ICD-10-CM

## 2023-08-19 DIAGNOSIS — D7589 Other specified diseases of blood and blood-forming organs: Secondary | ICD-10-CM

## 2023-08-19 DIAGNOSIS — F1011 Alcohol abuse, in remission: Secondary | ICD-10-CM

## 2023-08-19 DIAGNOSIS — R1115 Cyclical vomiting syndrome unrelated to migraine: Secondary | ICD-10-CM

## 2023-08-19 DIAGNOSIS — K625 Hemorrhage of anus and rectum: Secondary | ICD-10-CM

## 2023-08-19 DIAGNOSIS — K709 Alcoholic liver disease, unspecified: Secondary | ICD-10-CM

## 2023-08-19 MED ORDER — AMITRIPTYLINE HCL 10 MG PO TABS: 10.0000 mg | ORAL_TABLET | Freq: Every day | ORAL | 1 refills | Status: DC

## 2023-08-19 NOTE — Patient Instructions (Addendum)
 _______________________________________________________  If your blood pressure at your visit was 140/90 or greater, please contact your primary care physician to follow up on this.  _______________________________________________________  If you are age 43 or older, your body mass index should be between 23-30. Your Body mass index is 36.68 kg/m. If this is out of the aforementioned range listed, please consider follow up with your Primary Care Provider.  If you are age 73 or younger, your body mass index should be between 19-25. Your Body mass index is 36.68 kg/m. If this is out of the aformentioned range listed, please consider follow up with your Primary Care Provider.   ________________________________________________________  The [facility] providers would like to encourage you to use MYCHART to communicate with providers for non-urgent requests or questions.  Due to long hold times on the telephone, sending your provider a message by [facility] may be a faster and more efficient way to get a response.  Please allow 48 business hours for a response.  Please remember that this is for non-urgent requests.  _______________________________________________________  Your provider has requested that you go to the basement level for lab work before leaving today. Press B on the elevator. The lab is located at the first door on the left as you exit the elevator.  You have been scheduled for an appointment with Dr. Stacia on 10-01-23 at 11:10am . Please arrive 10 minutes early for your appointment.  We have sent the following medications to your pharmacy for you to pick up at your convenience: Elavil  10mg  daily at bedtime  It was a pleasure to see you today!  Thank you for trusting me with your gastrointestinal care!

## 2023-08-19 NOTE — Progress Notes (Signed)
 Discussed the use of AI scribe software for clinical note transcription with the patient, who gave verbal consent to proceed.  HPI : Janice Arnold is a 43 y.o. female with a history of anxiety, chronic alcohol abuse/alcohol induced fatty liver, iron  deficiency anemia and seizures who presents for follow-up of chronic nausea and vomiting. She was last seen in our office December 18 with ongoing symptoms of nausea and vomiting.  A gastric emptying study was subsequently done and was normal.  Today, the patient reports no changes in her symptoms.  She describes recurrent episodes of intense nausea and vomiting lasting several days, followed by periods of less severe symptoms or no symptoms.  However, she reports feeling nauseous almost all the time, with an increase in severity towards the end of the day. During these episodes, she is unable to consume liquids or solids, leading to prolonged recovery periods characterized by dizziness, weakness, and exhaustion.  She has been having these intense episodes of nausea and vomiting for over 2 years now.  They continue to occur on a monthly basis.  The patient also reports a persistent recurrent abdominal pain, localized in the upper abdomen, which has been present for approximately six to seven months. The pain is described as a constant ache with intermittent sharp, breath-taking episodes lasting about thirty seconds to a minute. The pain is exacerbated by bending, as if the ribs are pushing on something. An ultrasound was performed, with a reported finding of a fatty liver.  No gallstones were noted.  In addition to these symptoms, the patient has been experiencing changes in bowel habits, alternating between diarrhea and periods of constipation. She reports passing bright red blood and blood clots in her stool on one occasion, which was alarming. She has a history of a colonoscopy performed last year notable for ascending diverticulosis and a single 8 mm  rectal tubular adenoma.  The patient is currently on multiple anti-nausea medications, including Phenergan  taken daily and Zofran  taken during active vomiting episodes. She also has a prescription for metoclopramide , but does not find it as effective as Phenergan .  The patient has a history of alcohol use but reports cessation since August 2023, with a single episode of consumption in November 2023.  She also reports a history of chronic low potassium levels, back problems, and heavy menstrual bleeding, which is currently being managed with birth control pills. A hysterectomy was attempted but was unsuccessful due to scar tissue from a previous appendectomy. She is currently under the care of a specialist at [facility] for a potential alternative procedure to control menstrual bleeding.  The patient's symptoms have significantly impacted her daily life, hindering her ability to work and perform routine tasks. She is currently in the process of applying for disability benefits.       Aug 2023 EGD for IDA, N&V --Erosive gastropathy. Biopsied   Aug 2023 colonoscopy  Diverticulosis in ascending colon. One 8 mm rectal polyp was removed. Examined portion of ileum was normal .No abnormalities to explain weight loss, symptoms.    Surgical [P], duodenal BENIGN DUODENAL MUCOSA WITH NO DIAGNOSTIC ABNORMALITY 2. 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 PYLORI, INTESTINAL METAPLASIA, DYSPLASIA AND CARCINOMA 3. Surgical [P], gastric body MILD CHRONIC GASTRITIS WITH REACTIVE EPITHELIAL CHANGES NEGATIVE FOR H. PYLORI, INTESTINAL METAPLASIA, DYSPLASIA AND CARCINOMA 4. Surgical [P], colon, rectum, polyp (1) TUBULAR ADENOMA NEGATIVE FOR HIGH-GRADE DYSPLASIA AND CARCINOMA    Right upper quadrant ultrasound  August 11, 2023 IMPRESSION: 1. Contracted gallbladder. No cholelithiasis or sonographic evidence for acute cholecystitis. 2.  Increased hepatic parenchymal echogenicity suggestive of steatosis.   Gastric emptying study August 08, 2023 FINDINGS: Expected location of the stomach in the left upper quadrant. Ingested meal empties the stomach gradually over the course of the study.   5% emptied at 1 hr ( normal >= 10%)   49% emptied at 2 hr ( normal >= 40%)   95% emptied at 3 hr ( normal >= 70%)   IMPRESSION: No convincing scintigraphic evidence of delayed gastric emptying.   Right upper quadrant ultrasound May 29, 2022 IMPRESSION: Fatty liver.   No acute findings   CT abdomen/pelvis with contrast March 02, 2022 IMPRESSION: 1. Examination is positive for acute appendicitis. No signs of perforation or abscess formation. 2. Hepatosplenomegaly  CT abdomen/pelvis with contrast February 25, 2022 IMPRESSION: 1. Abnormal appearance of the appendix suggesting acute appendicitis. Adjacent wall thickening of the base of the cecum. 2. Hepatomegaly and hepatic steatosis. 3. Splenomegaly. 4. Left renal calculus  Past Medical History:  Diagnosis Date   Alcohol abuse    history of heavy alcohol abuse, last drank alcohol in 06/2022   Anemia 2023   s/p iron  infusions   Anxiety    Follows with PCP. Dr. Jenkins Fredrickson.   Compression fracture of body of thoracic vertebra (HCC) 11/05/2022   T4, T5, T6 / Follows w/ orthopedic, Dr. Ozell Ada. Fractures occurred while patient was having a seizure, per patient.   Cyclic vomiting syndrome    hospital admission on 05/28/22 for vomiting, follows with gastroenterologist Dr. Glendia Holt.   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 Glendia Holt.   GERD (gastroesophageal reflux disease)    takes omeprazole  daily   Heart murmur    since childhood   Hyperlipidemia    11/20/22 lipid panel in Epic   Hypokalemia 10/15/2022   K+ level  2.5   Menorrhagia    Palpitations    Follows w/ cardiology, Damien Boos, NP. 11/14/2022 echocardiogram EF 60 65%, 12/31/22 Event heart monitor results in Epic.   PONV (postoperative nausea and vomiting)    Prolonged QT interval    Follows w/ cardiology, Damien Braver, NP.   Seizure (HCC) 06/06/2022   Follows with neurologist, Dr. Hulan Falling @ [facility]. Seizures were thought to be related to stopping alcohol. However, patient has had seizures months after quitting alcohol. As of 02/06/23, last seizure was on 11/07/22. Keppra  was then increased to bid.   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 See 11/05/22 Head CT and MRI in Epic. Resolved per pt on 02/06/23.   Tremors of nervous system    whole body tremors, follows with Dr. Hulan Falling @ [facility].   Vitamin deficiency 2023   B12 deficiency, receiving monthly B12 injections / Vitamin D  deficiency, taking supplement every two weeks   Wears contact lenses    Wears glasses      Past Surgical History:  Procedure Laterality Date   COLONOSCOPY WITH ESOPHAGOGASTRODUODENOSCOPY (EGD)  03/27/2022   one rectal polyp   LAPAROSCOPIC APPENDECTOMY N/A 03/02/2022   Procedure: APPENDECTOMY LAPAROSCOPIC;  Surgeon: Belinda Cough, MD;  Location: [facility];  Service: General;  Laterality: N/A;   ROBOTIC ASSISTED LAPAROSCOPIC HYSTERECTOMY AND SALPINGECTOMY Bilateral  03/04/2023   Procedure: ABORTED XI ROBOTIC ASSISTED LAPAROSCOPIC HYSTERECTOMY AND SALPINGECTOMY;  Surgeon: Gorge Ade, MD;  Location: [facility];  Service: Gynecology;  Laterality: Bilateral;  Requests 2/1/2 hrs.   Family History  Problem Relation Age of Onset   Breast cancer Mother    Cervical cancer Mother        ovarian   Bone cancer Mother    Breast cancer Maternal Grandmother    Colon cancer Maternal Great-grandmother 61   Esophageal cancer Neg Hx    Stomach cancer Neg Hx     Seizures Neg Hx    Stroke Neg Hx    Social History   Tobacco Use   Smoking status: Never   Smokeless tobacco: Never  Vaping Use   Vaping status: Never Used  Substance Use Topics   Alcohol use: Not Currently    Comment: History of heavy alcohol abuse. Quit in 2023. Last drank alcohol in 06/2022.   Drug use: Never   Current Outpatient Medications  Medication Sig Dispense Refill   acetaminophen  (TYLENOL ) 500 MG tablet Take 500 mg by mouth every 6 (six) hours as needed.     DULoxetine  (CYMBALTA ) 60 MG capsule TAKE 1 CAPSULE BY MOUTH EVERY DAY 90 capsule 0   ezetimibe  (ZETIA ) 10 MG tablet Take 1 tablet (10 mg total) by mouth daily. 90 tablet 3   hydrochlorothiazide  (HYDRODIURIL ) 25 MG tablet Take 1 tablet (25 mg total) by mouth daily as needed. 90 tablet 0   HYDROcodone -acetaminophen  (NORCO/VICODIN) 5-325 MG tablet Take 1 tablet by mouth every 6 (six) hours as needed for moderate pain. 15 tablet 0   hydrOXYzine  (VISTARIL ) 25 MG capsule Take 1 capsule (25 mg total) by mouth every 8 (eight) hours as needed. 180 capsule 0   ibuprofen  (ADVIL ) 100 MG tablet Take 200 mg by mouth every 6 (six) hours as needed for fever.     KLOR-CON  M20 20 MEQ tablet TAKE 2 TABLETS (40 MEQ TOTAL) BY MOUTH 3 (THREE) TIMES DAILY. 540 tablet 1   levETIRAcetam  (KEPPRA ) 750 MG tablet Take 750 mg by mouth 2 (two) times daily. 1 tablet twice a day     lidocaine  (LIDODERM ) 5 % Place 1 patch onto the skin daily. Remove & Discard patch within 12 hours or as directed by MD 30 patch 0   methocarbamol  (ROBAXIN ) 500 MG tablet Take 1 tablet (500 mg total) by mouth every 8 (eight) hours as needed (pain muscle spasms). 120 tablet 1   metoCLOPramide  (REGLAN ) 10 MG tablet Take 1 tablet (10 mg total) by mouth every 8 (eight) hours as needed for nausea. 30 tablet 0   metoprolol  succinate (TOPROL -XL) 25 MG 24 hr tablet TAKE 1 TABLET (25 MG TOTAL) BY MOUTH DAILY. 90 tablet 3   norethindrone  (AYGESTIN ) 5 MG tablet Take by mouth daily.      norgestimate-ethinyl estradiol (ORTHO-CYCLEN) 0.25-35 MG-MCG tablet Take 1 tablet by mouth daily.     omeprazole  (PRILOSEC) 40 MG capsule Take 1 capsule (40 mg total) by mouth daily. 90 capsule 1   ondansetron  (ZOFRAN -ODT) 4 MG disintegrating tablet TAKE 1 TABLET BY MOUTH EVERY 8 HOURS AS NEEDED FOR NAUSEA AND VOMITING 20 tablet 1   oxyCODONE  (OXY IR/ROXICODONE ) 5 MG immediate release tablet Take 5 mg by mouth every 4 (four) hours as needed for severe pain. 1-2 tablets every 4 hrs as needed.     promethazine  (PHENERGAN ) 25 MG suppository Place 1 suppository (25 mg total) rectally every 6 (six) hours as needed  for nausea or vomiting. 12 each 3   promethazine  (PHENERGAN ) 25 MG tablet TAKE 1 TABLET (25 MG TOTAL) BY MOUTH DAILY. 90 tablet 0   spironolactone  (ALDACTONE ) 25 MG tablet Take 1 tablet (25 mg total) by mouth daily. 90 tablet 3   triamcinolone  cream (KENALOG ) 0.1 % Apply 1 Application topically 2 (two) times daily. (Patient taking differently: Apply 1 Application topically 2 (two) times daily. As needed) 30 g 0   Vitamin D , Ergocalciferol , (DRISDOL ) 1.25 MG (50000 UNIT) CAPS capsule Take 1 capsule (50,000 Units total) by mouth every 7 (seven) days. 12 capsule 0   No current facility-administered medications for this visit.   No Known Allergies   Review of Systems: All systems reviewed and negative except where noted in HPI.    XR Cervical Spine With Flex & Extend Result Date: 08/18/2023 XRs of the cervical spine from 08/18/2023 were independently reviewed and interpreted, showing no fracture or dislocation. No significant degenerative changes seen. No evidence of instability on flexion/extension views.   US  Abdomen Limited RUQ (LIVER/GB) Result Date: 08/11/2023 CLINICAL DATA:  Right upper quadrant pain EXAM: ULTRASOUND ABDOMEN LIMITED RIGHT UPPER QUADRANT COMPARISON:  None Available. FINDINGS: Gallbladder: Contracted gallbladder. Gallbladder wall thickness upper limits of normal. No  pericholecystic fluid. Negative sonographic Murphy's sign. Common bile duct: Diameter: 4.6 mm Liver: Increased echogenicity. No focal lesion. Portal vein is patent on color Doppler imaging with normal direction of blood flow towards the liver. Other: None. IMPRESSION: 1. Contracted gallbladder. No cholelithiasis or sonographic evidence for acute cholecystitis. 2. Increased hepatic parenchymal echogenicity suggestive of steatosis. Electronically Signed   By: Bard Moats M.D.   On: 08/11/2023 10:50   NM Gastric Emptying Result Date: 08/08/2023 CLINICAL DATA:  Concern for gastroparesis. EXAM: NUCLEAR MEDICINE GASTRIC EMPTYING SCAN TECHNIQUE: After oral ingestion of radiolabeled meal, sequential abdominal images were obtained for 3 hours. Percentage of activity emptying the stomach was calculated at 1 hour, 2 hour and 3 hours. RADIOPHARMACEUTICALS:  2.0 mCi Tc-69m sulfur  colloid in standardized meal COMPARISON:  None Available. FINDINGS: Expected location of the stomach in the left upper quadrant. Ingested meal empties the stomach gradually over the course of the study. 5% emptied at 1 hr ( normal >= 10%) 49% emptied at 2 hr ( normal >= 40%) 95% emptied at 3 hr ( normal >= 70%) IMPRESSION: No convincing scintigraphic evidence of delayed gastric emptying. Electronically Signed   By: Reyes Holder M.D.   On: 08/08/2023 13:08    Physical Exam: BP 132/88   Pulse 88   Ht 5' 7 (1.702 m)   Wt 234 lb 3.2 oz (106.2 kg)   LMP 01/29/2023 (Approximate)   BMI 36.68 kg/m  Constitutional: Pleasant,well-developed, Caucasian female in no acute distress. HEENT: Normocephalic and atraumatic. Conjunctivae are normal. No scleral icterus. Neck supple.  Cardiovascular: Normal rate, regular rhythm.  Pulmonary/chest: Effort normal and breath sounds normal. No wheezing, rales or rhonchi. Abdominal: Soft, nondistended, no significant tenderness to palpation. Bowel sounds active throughout. There are no masses palpable. No  hepatomegaly. Extremities: no edema Lymphadenopathy: No cervical adenopathy noted. Neurological: Alert and oriented to person place and time. Skin: Skin is warm and dry. No rashes noted. Psychiatric: Normal mood and affect. Behavior is normal.  CBC    Component Value Date/Time   WBC 6.8 08/07/2023 1142   RBC 3.35 (L) 08/07/2023 1142   HGB 12.0 08/07/2023 1142   HGB 11.5 (L) 06/26/2023 1000   HGB 12.9 07/16/2022 0939   HCT 36.6 08/07/2023  1142   HCT 37.6 07/16/2022 0939   PLT 219.0 08/07/2023 1142   PLT 193 06/26/2023 1000   PLT 295 07/16/2022 0939   MCV 109.1 (H) 08/07/2023 1142   MCV 102 (H) 07/16/2022 0939   MCH 36.5 (H) 06/27/2023 1037   MCHC 32.9 08/07/2023 1142   RDW 15.7 (H) 08/07/2023 1142   RDW 14.2 07/16/2022 0939   LYMPHSABS 1.3 08/07/2023 1142   MONOABS 0.4 08/07/2023 1142   EOSABS 0.1 08/07/2023 1142   BASOSABS 0.1 08/07/2023 1142    CMP     Component Value Date/Time   NA 143 08/18/2023 1644   NA 137 07/16/2022 0939   K 3.9 08/18/2023 1644   CL 107 08/18/2023 1644   CO2 24 08/18/2023 1644   GLUCOSE 90 08/18/2023 1644   BUN 7 08/18/2023 1644   BUN 12 07/16/2022 0939   CREATININE 0.53 08/18/2023 1644   CALCIUM 8.9 08/18/2023 1644   PROT 6.5 08/07/2023 1142   PROT 6.3 11/20/2022 1152   ALBUMIN 3.6 08/07/2023 1142   ALBUMIN 4.3 11/20/2022 1152   AST 46 (H) 08/07/2023 1142   AST 80 (H) 06/26/2023 1000   ALT 14 08/07/2023 1142   ALT 22 06/26/2023 1000   ALKPHOS 68 08/07/2023 1142   BILITOT 1.6 (H) 08/07/2023 1142   BILITOT 2.9 (H) 06/26/2023 1000   GFRNONAA >60 06/27/2023 1037   GFRNONAA >60 06/26/2023 1000       Latest Ref Rng & Units 08/07/2023   11:42 AM 06/27/2023   10:37 AM 06/26/2023   10:00 AM  CBC EXTENDED  WBC 4.0 - 10.5 K/uL 6.8  6.5  6.4   RBC 3.87 - 5.11 Mil/uL 3.35  3.07  3.17   Hemoglobin 12.0 - 15.0 g/dL 87.9  88.7  88.4   HCT 36.0 - 46.0 % 36.6  33.6  34.4   Platelets 150.0 - 400.0 K/uL 219.0  194  193   NEUT# 1.4 - 7.7 K/uL  4.9   4.2   Lymph# 0.7 - 4.0 K/uL 1.3   1.3       ASSESSMENT AND PLAN:  43 year old female with history of anxiety, alcohol use disorder with alcohol induced fatty liver,   Cyclic Vomiting Syndrome (CVS)   Recurrent intense episodes of nausea and vomiting consistent with CVS, a gut-brain axis disorder. Episodes often require hospitalization for IV rehydration. Current medications: Phenergan  daily, Zofran  as needed, and a suppository not yet tried. Discussed tricyclic antidepressants (Elavil ) to reduce episode frequency and severity. Risks: constipation, dry mouth, drowsiness, urinary retention, cardiac dysrhythmia. Benefits: potential reduction in episode frequency and duration. Initial low dose to assess tolerance, with gradual increase if tolerated.    - Start Elavil  at 10 mg nightly - Follow up in six weeks   - Submit disability application form    History of alcohol abuse/alcohol induced liver disease Patient reports last drink was over a year ago.  She does continue to have elevated liver enzymes, AST predominant as well as a macrocytosis.  It was previously thought that her alcohol was contributing to her nausea and vomiting symptoms.  Will check PETH to ensure that surreptitious alcohol use is not contributing to ongoing aminotransferase elevation and possible GI symptoms - Check PETH level  Blood in Stool   Recent blood clots in stool, likely due to internal hemorrhoids.  Colonoscopy last year without significant mass lesion or mucosal abnormality.  Discussed optimizing bowel habits to reduce hemorrhoid irritation.  Advised use of Anusol suppositories with more  significant bleeding.  Also discussed the role of hemorrhoid banding for treatment of refractory hemorrhoid symptoms if desired. - Start fiber supplement (Benefiber)   - Consider Anusol suppositories if bleeding persists   - Consider hemorrhoid banding if conservative measures fail    Anemia Taking oral iron  and monthly B12  shots.  Taking OCPs to help control menorrhagia undergoing evaluation for treatment of menorrhagia - Continue oral iron  and monthly B12 shots   -Follow-up [facility] gynecology for treatment of menorrhagia  Viraat Vanpatten E. Stacia, MD [facility]  I spent a total of 42 minutes reviewing the patient's medical record, interviewing and examining the patient, discussing her diagnosis and management of her condition going forward, and documenting in the medical record        Wendolyn Jenkins Jansky, MD

## 2023-08-20 ENCOUNTER — Encounter: Payer: Self-pay | Admitting: Family

## 2023-08-20 ENCOUNTER — Other Ambulatory Visit: Payer: No Typology Code available for payment source

## 2023-08-20 DIAGNOSIS — F1011 Alcohol abuse, in remission: Secondary | ICD-10-CM | POA: Diagnosis not present

## 2023-08-20 DIAGNOSIS — R1115 Cyclical vomiting syndrome unrelated to migraine: Secondary | ICD-10-CM

## 2023-08-22 ENCOUNTER — Other Ambulatory Visit: Payer: No Typology Code available for payment source

## 2023-08-23 LAB — BASIC METABOLIC PANEL WITH GFR
BUN: 7 mg/dL (ref 7–25)
CO2: 24 mmol/L (ref 20–32)
Calcium: 8.9 mg/dL (ref 8.6–10.2)
Chloride: 107 mmol/L (ref 98–110)
Creat: 0.53 mg/dL (ref 0.50–0.99)
Glucose, Bld: 90 mg/dL (ref 65–99)
Potassium: 3.9 mmol/L (ref 3.5–5.3)
Sodium: 143 mmol/L (ref 135–146)
eGFR: 118 mL/min/{1.73_m2} (ref >=60)

## 2023-08-23 LAB — RENIN: Renin Activity: 0.31 ng/mL/h (ref 0.25–5.82)

## 2023-08-23 LAB — ALDOSTERONE: Aldosterone: 3 ng/dL

## 2023-08-25 ENCOUNTER — Other Ambulatory Visit: Payer: Self-pay | Admitting: Family Medicine

## 2023-08-25 ENCOUNTER — Encounter: Payer: Self-pay | Admitting: Family

## 2023-08-25 DIAGNOSIS — F4323 Adjustment disorder with mixed anxiety and depressed mood: Secondary | ICD-10-CM

## 2023-08-25 LAB — PHOSPHATIDYLETHANOL (PETH)
Phosphatidylethanol (PEth): 373 ng/mL
Phosphatidylethanol: POSITIVE — AB

## 2023-08-26 ENCOUNTER — Other Ambulatory Visit: Payer: Medicaid Other

## 2023-09-04 ENCOUNTER — Telehealth: Payer: Self-pay | Admitting: Gastroenterology

## 2023-09-04 NOTE — Telephone Encounter (Signed)
Left VM for patient letting her know I have not received paper work for disability. I left fax number [phone]

## 2023-09-04 NOTE — Telephone Encounter (Signed)
Inbound call from patient stating paperwork regarding disability that were to be filled out was discussed at 1/7 office visit. States she spoke with her insurance and they advised they have not received any paperwork. Patient is requesting a urgent call back due to paperwork having the past due date of 1/10. Please advise, thank you.

## 2023-09-08 ENCOUNTER — Encounter: Payer: Self-pay | Admitting: Orthopedic Surgery

## 2023-09-09 ENCOUNTER — Ambulatory Visit: Payer: Medicaid Other | Admitting: Family Medicine

## 2023-09-09 NOTE — Telephone Encounter (Unsigned)
Copied from CRM [id number]. Topic: Appointments - Scheduling Inquiry for Clinic >> Sep 09, 2023  8:40 AM Irine Seal wrote: Reason for CRM: patient rescheduled her follow up appt from 01/28 to 02/05, she stated she was needing lab work done prior to her appt, did not see any orders, she is wanting to know if those labs are needing to be scheduled or can be done at her office visit  patient call back [phone]

## 2023-09-09 NOTE — Telephone Encounter (Signed)
Patient notified of message below and verbalized understanding.

## 2023-09-12 ENCOUNTER — Other Ambulatory Visit: Payer: Medicaid Other

## 2023-09-12 ENCOUNTER — Other Ambulatory Visit: Payer: Self-pay | Admitting: Gastroenterology

## 2023-09-13 DIAGNOSIS — Z419 Encounter for procedure for purposes other than remedying health state, unspecified: Secondary | ICD-10-CM | POA: Diagnosis not present

## 2023-09-15 ENCOUNTER — Ambulatory Visit: Payer: No Typology Code available for payment source | Admitting: Orthopedic Surgery

## 2023-09-16 ENCOUNTER — Other Ambulatory Visit: Payer: Self-pay | Admitting: Family Medicine

## 2023-09-16 ENCOUNTER — Other Ambulatory Visit: Payer: Self-pay | Admitting: Orthopedic Surgery

## 2023-09-16 DIAGNOSIS — F4323 Adjustment disorder with mixed anxiety and depressed mood: Secondary | ICD-10-CM

## 2023-09-17 ENCOUNTER — Encounter: Payer: Self-pay | Admitting: Family Medicine

## 2023-09-17 ENCOUNTER — Ambulatory Visit: Payer: Medicaid Other | Admitting: Family Medicine

## 2023-09-17 ENCOUNTER — Ambulatory Visit
Admission: RE | Admit: 2023-09-17 | Discharge: 2023-09-17 | Disposition: A | Discharge status: Home / Self Care | Payer: Medicaid Other | Source: Ambulatory Visit | Attending: Orthopedic Surgery | Admitting: Orthopedic Surgery

## 2023-09-17 VITALS — BP 117/80 | HR 84 | Temp 97.0°F | Resp 18 | Ht 67.0 in | Wt 222.2 lb

## 2023-09-17 DIAGNOSIS — R600 Localized edema: Secondary | ICD-10-CM | POA: Diagnosis not present

## 2023-09-17 DIAGNOSIS — E559 Vitamin D deficiency, unspecified: Secondary | ICD-10-CM

## 2023-09-17 DIAGNOSIS — M48061 Spinal stenosis, lumbar region without neurogenic claudication: Secondary | ICD-10-CM | POA: Diagnosis not present

## 2023-09-17 DIAGNOSIS — E538 Deficiency of other specified B group vitamins: Secondary | ICD-10-CM | POA: Diagnosis not present

## 2023-09-17 DIAGNOSIS — E782 Mixed hyperlipidemia: Secondary | ICD-10-CM

## 2023-09-17 DIAGNOSIS — M545 Low back pain, unspecified: Secondary | ICD-10-CM

## 2023-09-17 DIAGNOSIS — Z79899 Other long term (current) drug therapy: Secondary | ICD-10-CM | POA: Diagnosis not present

## 2023-09-17 DIAGNOSIS — E876 Hypokalemia: Secondary | ICD-10-CM

## 2023-09-17 DIAGNOSIS — D508 Other iron deficiency anemias: Secondary | ICD-10-CM | POA: Diagnosis not present

## 2023-09-17 DIAGNOSIS — F4323 Adjustment disorder with mixed anxiety and depressed mood: Secondary | ICD-10-CM | POA: Diagnosis not present

## 2023-09-17 DIAGNOSIS — M47816 Spondylosis without myelopathy or radiculopathy, lumbar region: Secondary | ICD-10-CM | POA: Diagnosis not present

## 2023-09-17 LAB — COMPREHENSIVE METABOLIC PANEL
ALT: 271 U/L — ABNORMAL HIGH (ref 0–35)
AST: 221 U/L — ABNORMAL HIGH (ref 0–37)
Albumin: 4.4 g/dL (ref 3.5–5.2)
Alkaline Phosphatase: 71 U/L (ref 39–117)
BUN: 16 mg/dL (ref 6–23)
CO2: 21 meq/L (ref 19–32)
Calcium: 9.5 mg/dL (ref 8.4–10.5)
Chloride: 104 meq/L (ref 96–112)
Creatinine, Ser: 0.87 mg/dL (ref 0.40–1.20)
GFR: 82.32 mL/min (ref >60.00)
Glucose, Bld: 100 mg/dL — ABNORMAL HIGH (ref 70–99)
Potassium: 4.5 meq/L (ref 3.5–5.1)
Sodium: 137 meq/L (ref 135–145)
Total Bilirubin: 1 mg/dL (ref 0.2–1.2)
Total Protein: 7.9 g/dL (ref 6.0–8.3)

## 2023-09-17 LAB — CBC WITH DIFFERENTIAL/PLATELET
Basophils Absolute: 0.1 10*3/uL (ref 0.0–0.1)
Basophils Relative: 0.6 % (ref 0.0–3.0)
Eosinophils Absolute: 0.3 10*3/uL (ref 0.0–0.7)
Eosinophils Relative: 2.4 % (ref 0.0–5.0)
HCT: 38.9 % (ref 36.0–46.0)
Hemoglobin: 12.9 g/dL (ref 12.0–15.0)
Lymphocytes Relative: 18.6 % (ref 12.0–46.0)
Lymphs Abs: 1.9 10*3/uL (ref 0.7–4.0)
MCHC: 33 g/dL (ref 30.0–36.0)
MCV: 98.6 fl (ref 78.0–100.0)
Monocytes Absolute: 0.4 10*3/uL (ref 0.1–1.0)
Monocytes Relative: 4.3 % (ref 3.0–12.0)
Neutro Abs: 7.7 10*3/uL (ref 1.4–7.7)
Neutrophils Relative %: 74.1 % (ref 43.0–77.0)
Platelets: 340 10*3/uL (ref 150.0–400.0)
RBC: 3.95 Mil/uL (ref 3.87–5.11)
RDW: 15.6 % — ABNORMAL HIGH (ref 11.5–15.5)
WBC: 10.4 10*3/uL (ref 4.0–10.5)

## 2023-09-17 LAB — LIPID PANEL
Cholesterol: 172 mg/dL (ref 0–200)
HDL: 32.4 mg/dL — ABNORMAL LOW
LDL Cholesterol: 104 mg/dL — ABNORMAL HIGH (ref 0–99)
NonHDL: 139.35
Total CHOL/HDL Ratio: 5
Triglycerides: 178 mg/dL — ABNORMAL HIGH (ref 0.0–149.0)
VLDL: 35.6 mg/dL (ref 0.0–40.0)

## 2023-09-17 LAB — MAGNESIUM: Magnesium: 1.9 mg/dL (ref 1.5–2.5)

## 2023-09-17 LAB — VITAMIN D 25 HYDROXY (VIT D DEFICIENCY, FRACTURES): VITD: 37.55 ng/mL (ref 30.00–100.00)

## 2023-09-17 MED ORDER — DULOXETINE HCL 60 MG PO CPEP: 60.0000 mg | ORAL_CAPSULE | Freq: Every day | ORAL | 1 refills | Status: DC

## 2023-09-17 MED ORDER — HYDROCHLOROTHIAZIDE 25 MG PO TABS: 25.0000 mg | ORAL_TABLET | Freq: Every day | ORAL | 0 refills | Status: DC | PRN

## 2023-09-17 MED ORDER — HYDROXYZINE PAMOATE 25 MG PO CAPS: 25.0000 mg | ORAL_CAPSULE | Freq: Four times a day (QID) | ORAL | 1 refills | Status: DC | PRN

## 2023-09-17 MED ORDER — CYANOCOBALAMIN 1000 MCG/ML IJ SOLN
1000.0000 ug | Freq: Once | INTRAMUSCULAR | Status: AC
  Administered 2023-09-17: 1000 ug via INTRAMUSCULAR

## 2023-09-17 MED ORDER — BUSPIRONE HCL 5 MG PO TABS: 5.0000 mg | ORAL_TABLET | Freq: Two times a day (BID) | ORAL | 1 refills | Status: DC

## 2023-09-17 NOTE — Progress Notes (Signed)
 Potassium and magnesium  much better-same doses for now.  Liver tests hav increased  stop taking any tylenol .  Repeat cmp on Monday and amylase/lipase.  Forwarding to Dr. Cherryl Corona as well

## 2023-09-17 NOTE — Progress Notes (Signed)
 Subjective:     Patient ID: Janice Arnold, female    DOB: [birth year], 43 y.o.   MRN: [id number]  Chief Complaint  Patient presents with   Medical Management of Chronic Issues    4 week follow-up Fasting     HPI Discussed the use of AI scribe software for clinical note transcription with the patient, who gave verbal consent to proceed.  History of Present Illness   Janice Arnold is a 43 year old female who presents for follow-up after starting amitriptyline . She was referred by Dr. Stacia for evaluation of her vomiting symptoms.  Since starting amitriptyline  10 mg nightly, she has experienced significant improvement in her vomiting symptoms, with no episodes since her last visit. She continues to take Proliferin every morning as part of her routine medication regimen.  She has been actively working on weight loss, having lost 12 pounds in the past month by increasing her physical activity and eating smaller, more frequent meals. Her goal is to reach a weight of 140-150 pounds.  Edema-Her current medication regimen includes hydrochlorothiazide  and spironolactone  daily, along with six potassium pills due to previous concerns about low potassium levels.  She also takes duloxetine  60 mg daily for mood stabilization. Hydroxyzine  25 mg four times a day is used to manage anxiety, which has been exacerbated by her husband's hospitalization. Despite using hydroxyzine , her anxiety remains significant. no SI  She experiences daytime sleepiness, which she attributes to her current medication regimen, including hydroxyzine , Keppra , and muscle relaxers. Despite this, she struggles with insomnia at night, often using melatonin gummies to aid sleep. No thoughts of suicide are present.  She reports muscle cramps and sharp pains in her spine, which she associates with her medication use, particularly hydrochlorothiazide . She has a history of a seizure and notes difficulty with cognitive tasks  since then.  She takes vitamin D  weekly and B12 monthly, and she is considering adding a B complex vitamin to her regimen.       Health Maintenance Due  Topic Date Due   MAMMOGRAM  04/29/2020    Past Medical History:  Diagnosis Date   Alcohol abuse    history of heavy alcohol abuse, last drank alcohol in 06/2022   Anemia 2023   s/p iron  infusions   Anxiety    Follows with PCP. Dr. Jenkins Fredrickson.   Compression fracture of body of thoracic vertebra (HCC) 11/05/2022   T4, T5, T6 / Follows w/ orthopedic, Dr. Ozell Ada. Fractures occurred while patient was having a seizure, per patient.   Cyclic vomiting syndrome    hospital admission on 05/28/22 for vomiting, follows with gastroenterologist Dr. Glendia Stacia.   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 Glendia Stacia.   GERD (gastroesophageal reflux disease)    takes omeprazole  daily   Heart murmur    since childhood   Hyperlipidemia    11/20/22 lipid panel in Epic   Hypokalemia 10/15/2022   K+ level 2.5   Menorrhagia    Palpitations    Follows w/ cardiology, Damien Boos, NP. 11/14/2022 echocardiogram EF 60 65%, 12/31/22 Event heart monitor results in Epic.   PONV (postoperative nausea and vomiting)    Prolonged QT interval    Follows w/ cardiology, Damien Braver, NP.   Seizure (HCC) 06/06/2022   Follows with neurologist, Dr. Hulan Falling @ [facility].  Seizures were thought to be related to stopping alcohol. However, patient has had seizures months after quitting alcohol. As of 02/06/23, last seizure was on 11/07/22. Keppra  was then increased to bid.   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 See 11/05/22 Head CT and MRI in Epic. Resolved per pt on 02/06/23.   Tremors of nervous system    whole body tremors, follows with Dr.  Hulan Falling @ [facility].   Vitamin deficiency 2023   B12 deficiency, receiving monthly B12 injections / Vitamin D  deficiency, taking supplement every two weeks   Wears contact lenses    Wears glasses     Past Surgical History:  Procedure Laterality Date   COLONOSCOPY WITH ESOPHAGOGASTRODUODENOSCOPY (EGD)  03/27/2022   one rectal polyp   LAPAROSCOPIC APPENDECTOMY N/A 03/02/2022   Procedure: APPENDECTOMY LAPAROSCOPIC;  Surgeon: Belinda Cough, MD;  Location: [facility];  Service: General;  Laterality: N/A;   ROBOTIC ASSISTED LAPAROSCOPIC HYSTERECTOMY AND SALPINGECTOMY Bilateral 03/04/2023   Procedure: ABORTED XI ROBOTIC ASSISTED LAPAROSCOPIC HYSTERECTOMY AND SALPINGECTOMY;  Surgeon: Gorge Ade, MD;  Location: [facility];  Service: Gynecology;  Laterality: Bilateral;  Requests 2/1/2 hrs.     Current Outpatient Medications:    acetaminophen  (TYLENOL ) 500 MG tablet, Take 500 mg by mouth every 6 (six) hours as needed., Disp: , Rfl:    amitriptyline  (ELAVIL ) 10 MG tablet, TAKE 1 TABLET BY MOUTH EVERYDAY AT BEDTIME, Disp: 90 tablet, Rfl: 1   busPIRone  (BUSPAR ) 5 MG tablet, Take 1 tablet (5 mg total) by mouth 2 (two) times daily., Disp: 60 tablet, Rfl: 1   ezetimibe  (ZETIA ) 10 MG tablet, Take 1 tablet (10 mg total) by mouth daily., Disp: 90 tablet, Rfl: 3   HYDROcodone -acetaminophen  (NORCO/VICODIN) 5-325 MG tablet, Take 1 tablet by mouth every 6 (six) hours as needed for moderate pain., Disp: 15 tablet, Rfl: 0   ibuprofen  (ADVIL ) 100 MG tablet, Take 200 mg by mouth every 6 (six) hours as needed for fever., Disp: , Rfl:    KLOR-CON  M20 20 MEQ tablet, TAKE 2 TABLETS (40 MEQ TOTAL) BY MOUTH 3 (THREE) TIMES DAILY., Disp: 540 tablet, Rfl: 1   levETIRAcetam  (KEPPRA ) 750 MG tablet, Take 750 mg by mouth 2 (two) times daily. 1 tablet twice a day, Disp: , Rfl:    lidocaine  (LIDODERM ) 5 %, PLACE 1 PATCH ONTO THE SKIN DAILY. REMOVE & DISCARD PATCH WITHIN 12 HOURS OR AS DIRECTED BY  MD, Disp: 30 patch, Rfl: 0   methocarbamol  (ROBAXIN ) 500 MG tablet, Take 1 tablet (500 mg total) by mouth every 8 (eight) hours as needed (pain muscle spasms)., Disp: 120 tablet, Rfl: 1   metoCLOPramide  (REGLAN ) 10 MG tablet, Take 1 tablet (10 mg total) by mouth every 8 (eight) hours as needed for nausea., Disp: 30 tablet, Rfl: 0   metoprolol  succinate (TOPROL -XL) 25 MG 24 hr tablet, TAKE 1 TABLET (25 MG TOTAL) BY MOUTH DAILY., Disp: 90 tablet, Rfl: 3   norethindrone  (AYGESTIN ) 5 MG tablet, Take by mouth daily., Disp: , Rfl:    norgestimate-ethinyl estradiol (ORTHO-CYCLEN) 0.25-35 MG-MCG tablet, Take 1 tablet by mouth daily., Disp: , Rfl:    omeprazole  (PRILOSEC) 40 MG capsule, Take 1 capsule (40 mg total) by mouth daily., Disp: 90 capsule, Rfl: 1   ondansetron  (ZOFRAN -ODT) 4 MG disintegrating tablet, TAKE 1 TABLET BY MOUTH EVERY 8 HOURS AS NEEDED FOR NAUSEA AND VOMITING, Disp: 20 tablet, Rfl: 1  oxyCODONE  (OXY IR/ROXICODONE ) 5 MG immediate release tablet, Take 5 mg by mouth every 4 (four) hours as needed for severe pain. 1-2 tablets every 4 hrs as needed., Disp: , Rfl:    promethazine  (PHENERGAN ) 25 MG suppository, Place 1 suppository (25 mg total) rectally every 6 (six) hours as needed for nausea or vomiting., Disp: 12 each, Rfl: 3   promethazine  (PHENERGAN ) 25 MG tablet, TAKE 1 TABLET (25 MG TOTAL) BY MOUTH DAILY., Disp: 90 tablet, Rfl: 0   spironolactone  (ALDACTONE ) 25 MG tablet, Take 1 tablet (25 mg total) by mouth daily., Disp: 90 tablet, Rfl: 3   triamcinolone  cream (KENALOG ) 0.1 %, Apply 1 Application topically 2 (two) times daily. (Patient taking differently: Apply 1 Application topically 2 (two) times daily. As needed), Disp: 30 g, Rfl: 0   Vitamin D , Ergocalciferol , (DRISDOL ) 1.25 MG (50000 UNIT) CAPS capsule, Take 1 capsule (50,000 Units total) by mouth every 7 (seven) days., Disp: 12 capsule, Rfl: 0   DULoxetine  (CYMBALTA ) 60 MG capsule, Take 1 capsule (60 mg total) by mouth daily., Disp:  90 capsule, Rfl: 1   hydrochlorothiazide  (HYDRODIURIL ) 25 MG tablet, Take 1 tablet (25 mg total) by mouth daily as needed., Disp: 90 tablet, Rfl: 0   hydrOXYzine  (VISTARIL ) 25 MG capsule, Take 1 capsule (25 mg total) by mouth every 6 (six) hours as needed., Disp: 360 capsule, Rfl: 1  Current Facility-Administered Medications:    cyanocobalamin  (VITAMIN B12) injection 1,000 mcg, 1,000 mcg, Intramuscular, Once, Armonii Sieh Marie, MD  No Known Allergies ROS neg/noncontributory except as noted HPI/below      Objective:     BP 117/80   Pulse 84   Temp (!) 97 F (36.1 C) (Temporal)   Resp 18   Ht 5' 7 (1.702 m)   Wt 222 lb 4 oz (100.8 kg)   LMP 01/29/2023 (Approximate)   SpO2 99%   BMI 34.81 kg/m  Wt Readings from Last 3 Encounters:  09/17/23 222 lb 4 oz (100.8 kg)  08/19/23 234 lb 3.2 oz (106.2 kg)  08/18/23 236 lb 6.4 oz (107.2 kg)    Physical Exam   Gen: WDWN NAD HEENT: NCAT, conjunctiva not injected, sclera nonicteric NECK:  supple, no thyromegaly, no nodes, no carotid bruits CARDIAC: RRR, S1S2+, no murmur. DP 2+B LUNGS: CTAB. No wheezes ABDOMEN:  BS+, soft, NTND, No HSM, no masses EXT:  no edema MSK: no gross abnormalities.  NEURO: A&O x3.  CN II-XII intact.  PSYCH: normal mood. Good eye contact     Assessment & Plan:  B12 deficiency -     Cyanocobalamin   Vitamin D  deficiency -     VITAMIN D  25 Hydroxy (Vit-D Deficiency, Fractures)  Hypokalemia  Local edema -     hydroCHLOROthiazide ; Take 1 tablet (25 mg total) by mouth daily as needed.  Dispense: 90 tablet; Refill: 0  Adjustment disorder with mixed anxiety and depressed mood -     busPIRone  HCl; Take 1 tablet (5 mg total) by mouth 2 (two) times daily.  Dispense: 60 tablet; Refill: 1 -     DULoxetine  HCl; Take 1 capsule (60 mg total) by mouth daily.  Dispense: 90 capsule; Refill: 1 -     hydrOXYzine  Pamoate; Take 1 capsule (25 mg total) by mouth every 6 (six) hours as needed.  Dispense: 360 capsule; Refill:  1  High risk medication use -     Comprehensive metabolic panel -     Magnesium   Other iron  deficiency anemia -  CBC with Differential/Platelet  Mixed hyperlipidemia -     Lipid panel  Assessment and Plan    Vomiting   Vomiting has significantly improved since starting amitriptyline  10 mg nightly and continuing Promethazine  daily. There have been no episodes since the last visit with Dr. Stacia. Continue both medications.  Anxiety   Increased stress and anxiety are due to the recent hospitalization of her husband. Currently taking hydroxyzine  25 mg every six hours and duloxetine  60 mg daily. Discussed adding Buspirone  to reduce hydroxyzine  use and improve anxiety control, starting with a low dose and titrating up as needed. Start Buspirone  5 mg twice daily and increase the dose after one week if needed. Continue hydroxyzine  and duloxetine .  Insomnia   Difficulty sleeping persists despite melatonin and amitriptyline , likely related to anxiety and daytime napping. Improving anxiety management may help with sleep issues. Continue melatonin and amitriptyline , and address anxiety management to improve sleep.  Edema w/Hypokalemia from meds  Low potassium levels are managed with hydrochlorothiazide , spironolactone , and potassium supplements. Potassium levels have normalized recently. Consider reducing hydrochlorothiazide  to decrease potassium supplementation needs while continuing all current medications.  Muscle Cramps   Muscle cramps are likely related to diuretic use and potassium levels. Reducing hydrochlorothiazide  may help alleviate cramps. Consider reducing hydrochlorothiazide  to half daily.  General Health Maintenance   Currently taking vitamin D , B12, and magnesium . Discussed adding a B complex vitamin due to stress, highlighting its benefits in managing stress and overall health. Continue vitamin D , B12, and magnesium , and add a B complex vitamin.  Follow-up   Follow up  next month. Monitor response to Buspirone  and adjust dosage as needed. Check B12 levels and order magnesium  levels.        Return in about 4 weeks (around 10/15/2023) for chronic follow-up.  Jenkins CHRISTELLA Carrel, MD

## 2023-09-17 NOTE — Patient Instructions (Addendum)
 It was very nice to see you today!  Buspar -can take 1 per day twice/day and in 1 week an go to 1 1/2 or 2 tabs twice daily and let me know Get a B complex vitamin  Try 1/2 hydrochlorothiazide  daily   PLEASE NOTE:  If you had any lab tests please let us  know if you have not heard back within a few days. You may see your results on MyChart before we have a chance to review them but we will give you a call once they are reviewed by us . If we ordered any referrals today, please let us  know if you have not heard from their office within the next week.   Please try these tips to maintain a healthy lifestyle:  Eat most of your calories during the day when you are active. Eliminate processed foods including packaged sweets (pies, cakes, cookies), reduce intake of potatoes, white bread, white pasta, and white rice. Look for whole grain options, oat flour or almond flour.  Each meal should contain half fruits/vegetables, one quarter protein, and one quarter carbs (no bigger than a computer mouse).  Cut down on sweet beverages. This includes juice, soda, and sweet tea. Also watch fruit intake, though this is a healthier sweet option, it still contains natural sugar! Limit to 3 servings daily.  Drink at least 1 glass of water  with each meal and aim for at least 8 glasses per day  Exercise at least 150 minutes every week.

## 2023-09-18 ENCOUNTER — Other Ambulatory Visit: Payer: Self-pay | Admitting: *Deleted

## 2023-09-18 DIAGNOSIS — R7989 Other specified abnormal findings of blood chemistry: Secondary | ICD-10-CM

## 2023-09-21 ENCOUNTER — Other Ambulatory Visit: Payer: Self-pay | Admitting: Family Medicine

## 2023-09-21 DIAGNOSIS — E559 Vitamin D deficiency, unspecified: Secondary | ICD-10-CM

## 2023-09-22 ENCOUNTER — Encounter: Payer: Self-pay | Admitting: Gastroenterology

## 2023-09-22 NOTE — Progress Notes (Signed)
 Janice Arnold,  Your PEth test was quite elevated.  This test indicates recent alcohol consumption.  Given this plus the worsening of your liver enzymes, I am very concerned you are continuing to drink alcohol.  Alcohol consumption and liver disease can cause many of the symptoms mentioned at your recent with Dr. Wendolyn, to include swelling in the legs, muscle cramps and difficulty sleeping.  If you are not drinking alcohol, then I think we may need to consider a liver biopsy if the enzymes remain elevated.    If you are drinking alcohol, I would strongly urge you to seek help to stop drinking.  This can be through various programs/support groups, or we can prescribe medications that can help reduce cravings.  Let us  know if we can help with achieving complete abstinence from alcohol.

## 2023-09-23 ENCOUNTER — Encounter: Payer: Self-pay | Admitting: Family

## 2023-09-25 ENCOUNTER — Inpatient Hospital Stay: Payer: Medicaid Other | Admitting: Medical Oncology

## 2023-09-25 ENCOUNTER — Inpatient Hospital Stay: Payer: Medicaid Other

## 2023-09-29 ENCOUNTER — Ambulatory Visit (INDEPENDENT_AMBULATORY_CARE_PROVIDER_SITE_OTHER): Payer: Self-pay | Admitting: Orthopedic Surgery

## 2023-09-29 DIAGNOSIS — M48061 Spinal stenosis, lumbar region without neurogenic claudication: Secondary | ICD-10-CM | POA: Diagnosis not present

## 2023-09-29 NOTE — Progress Notes (Signed)
 Orthopedic Spine Surgery Office Note     Assessment: Patient is a 43 y.o. female with 2 issues today:   1) lower lumbar back pain with no radicular symptoms.  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  Still no radicular symptoms     Plan: -Given the fact that her lumbar spine is more painful than her cervical, we will continue to monitor the cervical -Since she has found some relief with the lidocaine patches and Robaxin, I told her to keep using those -Recommended diagnostic/therapeutic injection at L4/5.  Referral provided to her today -Patient should return to office in 4 weeks, x-rays at next visit: None     Patient expressed understanding of the plan and all questions were answered to the patient's satisfaction.    ___________________________________________________________________________   History: Patient is a 43 y.o. female who comes in today to talk about her neck and lower back pain.  She states that her neck pain has been stable since she was last seen.  She does not notice it as much as her low back has been hurting her worse.  She does not have any pain radiating into either upper extremity.  She has not noticed any clumsiness or trouble with fine motor skills in her hands.  She has not developed any new symptoms in her neck or arms.  In regards to her low back, she is still having severe low back pain.  She feels around the belt line.  She does not have any pain radiating into either lower extremity.  She has found lidocaine and Robaxin helpful but pain is still significant even with their use.  No bowel or bladder incontinence.  No saddle anesthesia.     Previous treatments: robaxin, PT, medrol dosepak, tylenol, salonpas     Physical Exam:   General: no acute distress, appears stated age Neurologic: alert, answering questions appropriately, following  commands Respiratory: unlabored breathing on room air, symmetric chest rise Psychiatric: appropriate affect, normal cadence to speech     MSK (spine):   -Strength exam                                                   Left                  Right Grip strength                5/5                  5/5 Interosseus                  5/5                  5/5 Wrist extension            5/5                  5/5 Wrist flexion                 5/5                  5/5 Elbow flexion  5/5                  5/5 Deltoid                          5/5                  5/5   EHL                              5/5                  5/5 TA                                 5/5                  5/5 GSC                             5/5                  5/5 Knee extension            5/5                  5/5 Hip flexion                    5/5                  5/5   -Sensory exam                           Sensation intact to light touch in C5-T1 nerve distributions of bilateral upper extremities             Sensation intact to light touch in L3-S1 nerve distributions of bilateral lower extremities     Imaging: XRs of the lumbar spine from 07/02/2023 were previously independently reviewed and interpreted,showing disc height loss at L4/5.  No other significant degenerative changes seen.  No fracture or dislocation seen.  No evidence of instability on flexion/extension views.   MRI of the lumbar spine from 09/17/2023 was independently reviewed and interpreted, showing disc desiccation with a right paracentral disc herniation at L4/5.  Central stenosis, left lateral recess stenosis, and right-sided foraminal stenosis at L4/5.  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  No other significant stenosis seen.     Patient name: Janice Arnold Patient MRN: [id number] Date of visit: 09/29/23

## 2023-09-30 ENCOUNTER — Encounter: Payer: Self-pay | Admitting: Medical Oncology

## 2023-09-30 ENCOUNTER — Emergency Department (HOSPITAL_COMMUNITY)
Admission: EM | Admit: 2023-09-30 | Discharge: 2023-10-01 | Discharge status: Against Medical Advice | Payer: Medicaid Other | Attending: Emergency Medicine | Admitting: Emergency Medicine

## 2023-09-30 ENCOUNTER — Inpatient Hospital Stay: Payer: Medicaid Other | Attending: Medical Oncology

## 2023-09-30 ENCOUNTER — Other Ambulatory Visit: Payer: Self-pay

## 2023-09-30 ENCOUNTER — Inpatient Hospital Stay (HOSPITAL_BASED_OUTPATIENT_CLINIC_OR_DEPARTMENT_OTHER): Payer: Medicaid Other | Admitting: Medical Oncology

## 2023-09-30 ENCOUNTER — Encounter (HOSPITAL_COMMUNITY): Payer: Self-pay

## 2023-09-30 ENCOUNTER — Emergency Department (HOSPITAL_COMMUNITY): Payer: Medicaid Other

## 2023-09-30 ENCOUNTER — Telehealth: Payer: Self-pay

## 2023-09-30 VITALS — BP 112/69 | HR 76 | Temp 98.0°F | Resp 19 | Wt 229.0 lb

## 2023-09-30 DIAGNOSIS — E538 Deficiency of other specified B group vitamins: Secondary | ICD-10-CM

## 2023-09-30 DIAGNOSIS — E639 Nutritional deficiency, unspecified: Secondary | ICD-10-CM

## 2023-09-30 DIAGNOSIS — R109 Unspecified abdominal pain: Secondary | ICD-10-CM | POA: Diagnosis not present

## 2023-09-30 DIAGNOSIS — Z5321 Procedure and treatment not carried out due to patient leaving prior to being seen by health care provider: Secondary | ICD-10-CM | POA: Insufficient documentation

## 2023-09-30 DIAGNOSIS — D649 Anemia, unspecified: Secondary | ICD-10-CM

## 2023-09-30 DIAGNOSIS — D500 Iron deficiency anemia secondary to blood loss (chronic): Secondary | ICD-10-CM

## 2023-09-30 DIAGNOSIS — D509 Iron deficiency anemia, unspecified: Secondary | ICD-10-CM | POA: Insufficient documentation

## 2023-09-30 DIAGNOSIS — R7401 Elevation of levels of liver transaminase levels: Secondary | ICD-10-CM | POA: Diagnosis not present

## 2023-09-30 LAB — CBC WITH DIFFERENTIAL (CANCER CENTER ONLY)
Abs Immature Granulocytes: 0.06 10*3/uL (ref 0.00–0.07)
Basophils Absolute: 0.1 10*3/uL (ref 0.0–0.1)
Basophils Relative: 1 %
Eosinophils Absolute: 0.3 10*3/uL (ref 0.0–0.5)
Eosinophils Relative: 2 %
HCT: 38.6 % (ref 36.0–46.0)
Hemoglobin: 12.6 g/dL (ref 12.0–15.0)
Immature Granulocytes: 1 %
Lymphocytes Relative: 20 %
Lymphs Abs: 2.3 10*3/uL (ref 0.7–4.0)
MCH: 31.5 pg (ref 26.0–34.0)
MCHC: 32.6 g/dL (ref 30.0–36.0)
MCV: 96.5 fL (ref 80.0–100.0)
Monocytes Absolute: 0.6 10*3/uL (ref 0.1–1.0)
Monocytes Relative: 5 %
Neutro Abs: 8.5 10*3/uL — ABNORMAL HIGH (ref 1.7–7.7)
Neutrophils Relative %: 71 %
Platelet Count: 295 10*3/uL (ref 150–400)
RBC: 4 MIL/uL (ref 3.87–5.11)
RDW: 13.4 % (ref 11.5–15.5)
WBC Count: 11.8 10*3/uL — ABNORMAL HIGH (ref 4.0–10.5)
nRBC: 0 % (ref 0.0–0.2)

## 2023-09-30 LAB — CMP (CANCER CENTER ONLY)
ALT: 659 U/L (ref 0–44)
AST: 336 U/L (ref 15–41)
Albumin: 4.4 g/dL (ref 3.5–5.0)
Alkaline Phosphatase: 64 U/L (ref 38–126)
Anion gap: 8 (ref 5–15)
BUN: 11 mg/dL (ref 6–20)
CO2: 26 mmol/L (ref 22–32)
Calcium: 9.9 mg/dL (ref 8.9–10.3)
Chloride: 104 mmol/L (ref 98–111)
Creatinine: 0.69 mg/dL (ref 0.44–1.00)
GFR, Estimated: >60 mL/min (ref >60)
Glucose, Bld: 82 mg/dL (ref 70–99)
Potassium: 4.1 mmol/L (ref 3.5–5.1)
Sodium: 138 mmol/L (ref 135–145)
Total Bilirubin: 0.9 mg/dL (ref 0.0–1.2)
Total Protein: 7.6 g/dL (ref 6.5–8.1)

## 2023-09-30 LAB — IRON AND IRON BINDING CAPACITY (CC-WL,HP ONLY)
Iron: 51 ug/dL (ref 28–170)
Saturation Ratios: 12 % (ref 10.4–31.8)
TIBC: 428 ug/dL (ref 250–450)
UIBC: 377 ug/dL (ref 148–442)

## 2023-09-30 LAB — FOLATE: Folate: 10.9 ng/mL (ref >5.9)

## 2023-09-30 LAB — VITAMIN B12: Vitamin B-12: 377 pg/mL (ref 180–914)

## 2023-09-30 LAB — FERRITIN: Ferritin: 59 ng/mL (ref 11–307)

## 2023-09-30 NOTE — ED Triage Notes (Signed)
 Pt went to PCP today and had elevated liver function (AST 336 and ALT 659) and WBC count of 11.8. Pt states hx of elevated LFT but never this high. Right sided abdominal pain for a few months that has been getting worse.

## 2023-09-30 NOTE — Telephone Encounter (Signed)
 Received a critical lab from [facility]. Pt's AST is 336 and ALT is 659. Clent Jacks, PA advised and plans to follow up with pt at in office visit today.

## 2023-09-30 NOTE — Progress Notes (Signed)
 Hematology and Oncology Follow Up Visit  KHADIJA THIER [id number] [birth year] 43 y.o. 09/30/2023   Principle Diagnosis:  Iron deficiency anemia  B 12 deficiency  Current Therapy:   IV iron as indicated - Venofer 300 mg last dose 05/16/2022 B 12 injections with PCP- overdue currently    Interim History:  Ms. Ricciardi is here today for follow-up.   Today she states that she has been ok. Her chronic RUQ abdominal pain has worsened over the past few weeks. Now a constant pain. No fever or yellowing of the skin. She denies ETOH or tylenol use.  At her last visit her nutritional status was poor. She was eating every other day. Ensure was recommended.  She has not noted any obvious blood loss. No petechiae.  No fever, chills, n/v, cough, rash, SOB, chest pain, palpitations or changes in bowel or bladder habits.  No swelling, numbness or tingling in her extremities at this time.  No falls or syncope reported.  Wt Readings from Last 3 Encounters:  09/30/23 229 lb (103.9 kg)  09/17/23 222 lb 4 oz (100.8 kg)  08/19/23 234 lb 3.2 oz (106.2 kg)   ECOG Performance Status: 1 - Symptomatic but completely ambulatory  Medications:  Allergies as of 09/30/2023   No Known Allergies      Medication List        Accurate as of September 30, 2023 12:03 PM. If you have any questions, ask your nurse or doctor.          acetaminophen 500 MG tablet Commonly known as: TYLENOL Take 500 mg by mouth every 6 (six) hours as needed.   amitriptyline 10 MG tablet Commonly known as: ELAVIL TAKE 1 TABLET BY MOUTH EVERYDAY AT BEDTIME   busPIRone 5 MG tablet Commonly known as: BUSPAR Take 1 tablet (5 mg total) by mouth 2 (two) times daily.   DULoxetine 60 MG capsule Commonly known as: CYMBALTA Take 1 capsule (60 mg total) by mouth daily.   ezetimibe 10 MG tablet Commonly known as: ZETIA Take 1 tablet (10 mg total) by mouth daily.   hydrochlorothiazide 25 MG tablet Commonly known as:  HYDRODIURIL Take 1 tablet (25 mg total) by mouth daily as needed.   HYDROcodone-acetaminophen 5-325 MG tablet Commonly known as: NORCO/VICODIN Take 1 tablet by mouth every 6 (six) hours as needed for moderate pain.   hydrOXYzine 25 MG capsule Commonly known as: VISTARIL Take 1 capsule (25 mg total) by mouth every 6 (six) hours as needed.   ibuprofen 100 MG tablet Commonly known as: ADVIL Take 200 mg by mouth every 6 (six) hours as needed for fever.   Klor-Con M20 20 MEQ tablet Generic drug: potassium chloride SA TAKE 2 TABLETS (40 MEQ TOTAL) BY MOUTH 3 (THREE) TIMES DAILY.   levETIRAcetam 750 MG tablet Commonly known as: KEPPRA Take 750 mg by mouth 2 (two) times daily. 1 tablet twice a day   lidocaine 5 % Commonly known as: LIDODERM PLACE 1 PATCH ONTO THE SKIN DAILY. REMOVE & DISCARD PATCH WITHIN 12 HOURS OR AS DIRECTED BY MD   methocarbamol 500 MG tablet Commonly known as: ROBAXIN Take 1 tablet (500 mg total) by mouth every 8 (eight) hours as needed (pain muscle spasms).   metoCLOPramide 10 MG tablet Commonly known as: REGLAN Take 1 tablet (10 mg total) by mouth every 8 (eight) hours as needed for nausea.   metoprolol succinate 25 MG 24 hr tablet Commonly known as: TOPROL-XL TAKE 1 TABLET (25 MG TOTAL) BY  MOUTH DAILY.   norethindrone 5 MG tablet Commonly known as: AYGESTIN Take by mouth daily.   norgestimate-ethinyl estradiol 0.25-35 MG-MCG tablet Commonly known as: ORTHO-CYCLEN Take 1 tablet by mouth daily.   omeprazole 40 MG capsule Commonly known as: PRILOSEC Take 1 capsule (40 mg total) by mouth daily.   ondansetron 4 MG disintegrating tablet Commonly known as: ZOFRAN-ODT TAKE 1 TABLET BY MOUTH EVERY 8 HOURS AS NEEDED FOR NAUSEA AND VOMITING   oxyCODONE 5 MG immediate release tablet Commonly known as: Oxy IR/ROXICODONE Take 5 mg by mouth every 4 (four) hours as needed for severe pain. 1-2 tablets every 4 hrs as needed.   promethazine 25 MG  suppository Commonly known as: PHENERGAN Place 1 suppository (25 mg total) rectally every 6 (six) hours as needed for nausea or vomiting.   promethazine 25 MG tablet Commonly known as: PHENERGAN TAKE 1 TABLET (25 MG TOTAL) BY MOUTH DAILY.   spironolactone 25 MG tablet Commonly known as: ALDACTONE Take 1 tablet (25 mg total) by mouth daily.   triamcinolone cream 0.1 % Commonly known as: KENALOG Apply 1 Application topically 2 (two) times daily. What changed: additional instructions   Vitamin D (Ergocalciferol) 1.25 MG (50000 UNIT) Caps capsule Commonly known as: DRISDOL TAKE 1 CAPSULE (50,000 UNITS TOTAL) BY MOUTH EVERY 7 (SEVEN) DAYS        Allergies: No Known Allergies  Past Medical History, Surgical history, Social history, and Family History were reviewed and updated.  Review of Systems: All other 10 point review of systems is negative.   Physical Exam:  weight is 229 lb (103.9 kg). Her oral temperature is 98 F (36.7 C). Her blood pressure is 112/69 and her pulse is 76. Her respiration is 19 and oxygen saturation is 100%.   Wt Readings from Last 3 Encounters:  09/30/23 229 lb (103.9 kg)  09/17/23 222 lb 4 oz (100.8 kg)  08/19/23 234 lb 3.2 oz (106.2 kg)   Constitutional: No jaundice  Ocular: Sclerae unicteric, pupils equal, round and reactive to light Ear-nose-throat: Oropharynx clear, dentition fair Lymphatic: No cervical or supraclavicular adenopathy Lungs no rales or rhonchi, good excursion bilaterally Heart regular rate and rhythm, no murmur appreciated Abd soft, RUQ tenderness, positive bowel sounds MSK no focal spinal tenderness, no joint edema Neuro: non-focal, well-oriented, appropriate affect   Lab Results  Component Value Date   WBC 11.8 (H) 09/30/2023   HGB 12.6 09/30/2023   HCT 38.6 09/30/2023   MCV 96.5 09/30/2023   PLT 295 09/30/2023   Lab Results  Component Value Date   FERRITIN 81 06/26/2023   IRON 121 06/26/2023   TIBC 262  06/26/2023   UIBC 141 (L) 06/26/2023   IRONPCTSAT 46 (H) 06/26/2023   Lab Results  Component Value Date   RETICCTPCT 1.6 04/19/2022   RBC 4.00 09/30/2023   No results found for: "KPAFRELGTCHN", "LAMBDASER", "KAPLAMBRATIO" Lab Results  Component Value Date   IGGSERUM 1,195 02/21/2022   No results found for: "TOTALPROTELP", "ALBUMINELP", "A1GS", "A2GS", "BETS", "BETA2SER", "GAMS", "MSPIKE", "SPEI"   Chemistry      Component Value Date/Time   NA 137 09/17/2023 1105   NA 137 07/16/2022 0939   K 4.5 09/17/2023 1105   CL 104 09/17/2023 1105   CO2 21 09/17/2023 1105   BUN 16 09/17/2023 1105   BUN 12 07/16/2022 0939   CREATININE 0.87 09/17/2023 1105   CREATININE 0.53 08/18/2023 1644      Component Value Date/Time   CALCIUM 9.5 09/17/2023 1105  ALKPHOS 71 09/17/2023 1105   AST 221 (H) 09/17/2023 1105   AST 80 (H) 06/26/2023 1000   ALT 271 (H) 09/17/2023 1105   ALT 22 06/26/2023 1000   BILITOT 1.0 09/17/2023 1105   BILITOT 2.9 (H) 06/26/2023 1000     Encounter Diagnoses  Name Primary?   Vitamin B 12 deficiency Yes   Iron deficiency anemia due to chronic blood loss    Poor diet    Anemia, unspecified type    Impression and Plan: Ms. Tercero is a very pleasant 43 yo caucasian female with significant anemia noted over the last month. She was found to be B 12 and iron deficiency.   Today in terms of her anemia she appears to be doing well.  Hgb is normal at 12.6. Normocytic.  WBC is up slightly to 11.8. CMP shows critical elevations in her LFTs. I discussed my concerns regarding a blockage or early cholecystitis vs other with patient. She is agreeable to go to the ER for further work up but would like to get her children from school first as she is the only driver.   Iron studies are pending. We will replace if needed.  B 12 pending. She will continue to get injections with PCP.   RTC 3 months APP, labs (CBC, CMP, iron, ferritin, B12, folate)  Rushie Chestnut,  PA-C 2/18/202512:03 PM

## 2023-10-01 ENCOUNTER — Ambulatory Visit (HOSPITAL_BASED_OUTPATIENT_CLINIC_OR_DEPARTMENT_OTHER)
Admission: RE | Admit: 2023-10-01 | Discharge: 2023-10-01 | Disposition: A | Discharge status: Home / Self Care | Payer: Medicaid Other | Source: Ambulatory Visit | Attending: Family Medicine | Admitting: Family Medicine

## 2023-10-01 ENCOUNTER — Telehealth: Payer: Self-pay

## 2023-10-01 ENCOUNTER — Ambulatory Visit: Payer: Self-pay | Admitting: Gastroenterology

## 2023-10-01 ENCOUNTER — Other Ambulatory Visit: Payer: Self-pay

## 2023-10-01 ENCOUNTER — Encounter: Payer: Self-pay | Admitting: Family Medicine

## 2023-10-01 ENCOUNTER — Telehealth: Payer: Self-pay | Admitting: Gastroenterology

## 2023-10-01 ENCOUNTER — Other Ambulatory Visit: Payer: Self-pay | Admitting: Family Medicine

## 2023-10-01 DIAGNOSIS — R7989 Other specified abnormal findings of blood chemistry: Secondary | ICD-10-CM | POA: Insufficient documentation

## 2023-10-01 NOTE — Progress Notes (Signed)
 Spoke w/pt.  Only symptom-more tired and more nausea.  U/s ok.  Repeat labs 2 days.  Call GI-needs liver bx.  Worse, symptoms, ER

## 2023-10-01 NOTE — Progress Notes (Signed)
 Spoke to pt.  See note.  Nothing to explain elevated lft's.  Pt denies ETOH.  No tylenol.

## 2023-10-01 NOTE — Telephone Encounter (Signed)
 Copied from CRM [id number]. Topic: Clinical - Lab/Test Results >> Oct 01, 2023  8:22 AM Deaijah H wrote: Reason for CRM: Patient called in stating she had blood work completed at the oncology center and [facility] came back bad , liver enzymes were high came back as critical. Was advised to go to the ER, but was never seen after 5 hrs. Would like Dr. Ruthine Dose to take a look at labs and give a call back with her advice / please call [phone]  Please see patient call note regarding recent labs and advise any suggestions and/or recommendations for patient

## 2023-10-01 NOTE — Telephone Encounter (Signed)
 STAT order placed, contacted pt and lvm advising order being placed per PCP; Misty Stanley working on scheduling and will contact patient directly

## 2023-10-01 NOTE — Telephone Encounter (Signed)
 Inbound call from patient stating that she got a call that she needed to reschedule her appointment for today at 11:10. Patient was offered to come in this morning and did not show. Patient was offered Dr. Judeth Horn next appointment in April and she stated that she had been In touch with her PCP and they advised her that she needs a liver biopsy and that she thinks Dr. Tomasa Rand would want to see her before April. Please advise.

## 2023-10-01 NOTE — Telephone Encounter (Signed)
 See note below from patient. Not sure if you want to see her for a visit or just schedule her for liver bx. Please advise.

## 2023-10-02 ENCOUNTER — Other Ambulatory Visit: Payer: Medicaid Other

## 2023-10-02 ENCOUNTER — Other Ambulatory Visit: Payer: Self-pay

## 2023-10-02 ENCOUNTER — Ambulatory Visit: Payer: Managed Care, Other (non HMO) | Admitting: Neurology

## 2023-10-02 DIAGNOSIS — R7989 Other specified abnormal findings of blood chemistry: Secondary | ICD-10-CM

## 2023-10-02 NOTE — Telephone Encounter (Signed)
 Order placed for liver bx, rad scheduling to contact pt to set up the appt. Pt scheduled to see Dr. Tomasa Rand 10/30/23 at 2:30pm. Pt notified via mychart.

## 2023-10-03 ENCOUNTER — Other Ambulatory Visit (INDEPENDENT_AMBULATORY_CARE_PROVIDER_SITE_OTHER): Payer: Medicaid Other

## 2023-10-03 ENCOUNTER — Encounter: Payer: Self-pay | Admitting: Family Medicine

## 2023-10-03 ENCOUNTER — Other Ambulatory Visit: Payer: Self-pay | Admitting: *Deleted

## 2023-10-03 DIAGNOSIS — R7989 Other specified abnormal findings of blood chemistry: Secondary | ICD-10-CM

## 2023-10-03 LAB — CBC WITH DIFFERENTIAL/PLATELET
Basophils Absolute: 0.1 10*3/uL (ref 0.0–0.1)
Basophils Relative: 0.6 % (ref 0.0–3.0)
Eosinophils Absolute: 0.2 10*3/uL (ref 0.0–0.7)
Eosinophils Relative: 2.1 % (ref 0.0–5.0)
HCT: 39.4 % (ref 36.0–46.0)
Hemoglobin: 12.9 g/dL (ref 12.0–15.0)
Lymphocytes Relative: 17.4 % (ref 12.0–46.0)
Lymphs Abs: 1.5 10*3/uL (ref 0.7–4.0)
MCHC: 32.9 g/dL (ref 30.0–36.0)
MCV: 95.9 fL (ref 78.0–100.0)
Monocytes Absolute: 0.5 10*3/uL (ref 0.1–1.0)
Monocytes Relative: 5.3 % (ref 3.0–12.0)
Neutro Abs: 6.5 10*3/uL (ref 1.4–7.7)
Neutrophils Relative %: 74.6 % (ref 43.0–77.0)
Platelets: 298 10*3/uL (ref 150.0–400.0)
RBC: 4.1 Mil/uL (ref 3.87–5.11)
RDW: 14.9 % (ref 11.5–15.5)
WBC: 8.7 10*3/uL (ref 4.0–10.5)

## 2023-10-03 LAB — COMPREHENSIVE METABOLIC PANEL
ALT: 609 U/L — ABNORMAL HIGH (ref 0–35)
AST: 275 U/L — ABNORMAL HIGH (ref 0–37)
Albumin: 4.3 g/dL (ref 3.5–5.2)
Alkaline Phosphatase: 64 U/L (ref 39–117)
BUN: 10 mg/dL (ref 6–23)
CO2: 24 meq/L (ref 19–32)
Calcium: 9.8 mg/dL (ref 8.4–10.5)
Chloride: 103 meq/L (ref 96–112)
Creatinine, Ser: 0.65 mg/dL (ref 0.40–1.20)
GFR: 108.76 mL/min (ref >60.00)
Glucose, Bld: 103 mg/dL — ABNORMAL HIGH (ref 70–99)
Potassium: 4.1 meq/L (ref 3.5–5.1)
Sodium: 136 meq/L (ref 135–145)
Total Bilirubin: 1.1 mg/dL (ref 0.2–1.2)
Total Protein: 7.9 g/dL (ref 6.0–8.3)

## 2023-10-03 NOTE — Progress Notes (Signed)
 Minimally better.  Repeat next Thursday- cmp

## 2023-10-04 LAB — HEPATITIS B SURFACE ANTIBODY, QUANTITATIVE: Hep B S AB Quant (Post): <5 m[IU]/mL — ABNORMAL LOW (ref >=10)

## 2023-10-04 LAB — HEPATITIS B SURFACE ANTIGEN: Hepatitis B Surface Ag: NONREACTIVE

## 2023-10-04 LAB — HEPATITIS C ANTIBODY: Hepatitis C Ab: NONREACTIVE

## 2023-10-04 LAB — HEPATITIS B CORE ANTIBODY, IGM: Hep B C IgM: NONREACTIVE

## 2023-10-04 LAB — HEPATITIS B CORE ANTIBODY, TOTAL: Hep B Core Total Ab: NONREACTIVE

## 2023-10-04 LAB — HEPATITIS A ANTIBODY, IGM: Hep A IgM: NONREACTIVE

## 2023-10-07 ENCOUNTER — Encounter: Payer: Self-pay | Admitting: Medical Oncology

## 2023-10-09 ENCOUNTER — Ambulatory Visit: Payer: No Typology Code available for payment source | Admitting: Orthopedic Surgery

## 2023-10-09 ENCOUNTER — Other Ambulatory Visit (INDEPENDENT_AMBULATORY_CARE_PROVIDER_SITE_OTHER): Payer: Medicaid Other

## 2023-10-09 DIAGNOSIS — R7989 Other specified abnormal findings of blood chemistry: Secondary | ICD-10-CM | POA: Diagnosis not present

## 2023-10-09 LAB — COMPREHENSIVE METABOLIC PANEL
ALT: 502 U/L — ABNORMAL HIGH (ref 0–35)
AST: 309 U/L — ABNORMAL HIGH (ref 0–37)
Albumin: 4.1 g/dL (ref 3.5–5.2)
Alkaline Phosphatase: 60 U/L (ref 39–117)
BUN: 11 mg/dL (ref 6–23)
CO2: 23 meq/L (ref 19–32)
Calcium: 9.4 mg/dL (ref 8.4–10.5)
Chloride: 104 meq/L (ref 96–112)
Creatinine, Ser: 0.61 mg/dL (ref 0.40–1.20)
GFR: 110.42 mL/min (ref >60.00)
Glucose, Bld: 101 mg/dL — ABNORMAL HIGH (ref 70–99)
Potassium: 4.2 meq/L (ref 3.5–5.1)
Sodium: 135 meq/L (ref 135–145)
Total Bilirubin: 0.9 mg/dL (ref 0.2–1.2)
Total Protein: 7.8 g/dL (ref 6.0–8.3)

## 2023-10-10 ENCOUNTER — Encounter: Payer: Self-pay | Admitting: Family

## 2023-10-10 ENCOUNTER — Other Ambulatory Visit: Payer: Self-pay | Admitting: Family Medicine

## 2023-10-10 ENCOUNTER — Encounter: Payer: Self-pay | Admitting: Family Medicine

## 2023-10-10 NOTE — Progress Notes (Signed)
 Liver tests still elevated.  She needs to contact neuro to see if that is causing it.  I will try as well, but she needs to message them as well

## 2023-10-11 DIAGNOSIS — Z419 Encounter for procedure for purposes other than remedying health state, unspecified: Secondary | ICD-10-CM | POA: Diagnosis not present

## 2023-10-13 ENCOUNTER — Ambulatory Visit (INDEPENDENT_AMBULATORY_CARE_PROVIDER_SITE_OTHER): Payer: Medicaid Other | Admitting: Physical Medicine and Rehabilitation

## 2023-10-13 ENCOUNTER — Other Ambulatory Visit: Payer: Self-pay

## 2023-10-13 VITALS — BP 118/83 | HR 96

## 2023-10-13 DIAGNOSIS — M5416 Radiculopathy, lumbar region: Secondary | ICD-10-CM

## 2023-10-13 MED ORDER — METHYLPREDNISOLONE ACETATE 40 MG/ML IJ SUSP
40.0000 mg | Freq: Once | INTRAMUSCULAR | Status: AC
  Administered 2023-10-13: 40 mg

## 2023-10-13 NOTE — Procedures (Signed)
 Lumbar Epidural Steroid Injection - Interlaminar Approach with Fluoroscopic Guidance  Patient: Janice Arnold      Date of Birth: June 20, 1981 MRN: [id number] PCP: Jeani Sow, MD      Visit Date: 10/13/2023   Universal Protocol:     Consent Given By: the patient  Position: PRONE  Additional Comments: Vital signs were monitored before and after the procedure. Patient was prepped and draped in the usual sterile fashion. The correct patient, procedure, and site was verified.   Injection Procedure Details:   Procedure diagnoses: Lumbar radiculopathy [M54.16]   Meds Administered:  Meds ordered this encounter  Medications   methylPREDNISolone acetate (DEPO-MEDROL) injection 40 mg     Laterality: Right  Location/Site:  L4-5  Needle: 3.5 in., 20 ga. Tuohy  Needle Placement: Paramedian epidural  Findings:   -Comments: Excellent flow of contrast into the epidural space.  Procedure Details: Using a paramedian approach from the side mentioned above, the region overlying the inferior lamina was localized under fluoroscopic visualization and the soft tissues overlying this structure were infiltrated with 4 ml. of 1% Lidocaine without Epinephrine. The Tuohy needle was inserted into the epidural space using a paramedian approach.   The epidural space was localized using loss of resistance along with counter oblique bi-planar fluoroscopic views.  After negative aspirate for air, blood, and CSF, a 2 ml. volume of Isovue-250 was injected into the epidural space and the flow of contrast was observed. Radiographs were obtained for documentation purposes.    The injectate was administered into the level noted above.   Additional Comments:  No complications occurred Dressing: 2 x 2 sterile gauze and Band-Aid    Post-procedure details: Patient was observed during the procedure. Post-procedure instructions were reviewed.  Patient left the clinic in stable condition.

## 2023-10-13 NOTE — Patient Instructions (Signed)

## 2023-10-13 NOTE — Progress Notes (Signed)
 Pain Score---7 No Allergies to Contrast Dye No Blood thinners

## 2023-10-13 NOTE — Progress Notes (Signed)
 Janice Arnold - 43 y.o. female MRN [id number]  Date of birth: 1981-06-21  Office Visit Note: Visit Date: 10/13/2023 PCP: Jeani Sow, MD Referred by: London Sheer, MD  Subjective: Chief Complaint  Patient presents with   Lower Back - Pain   HPI:  Janice Arnold is a 43 y.o. female who comes in today at the request of Dr. Willia Craze for planned Midline L4-5 Lumbar Interlaminar epidural steroid injection with fluoroscopic guidance.  The patient has failed conservative care including home exercise, medications, time and activity modification.  This injection will be diagnostic and hopefully therapeutic.  Please see requesting physician notes for further details and justification.   ROS Otherwise per HPI.  Assessment & Plan: Visit Diagnoses:    ICD-10-CM   1. Lumbar radiculopathy  M54.16 XR C-ARM NO REPORT    Epidural Steroid injection    methylPREDNISolone acetate (DEPO-MEDROL) injection 40 mg      Plan: No additional findings.   Meds & Orders:  Meds ordered this encounter  Medications   methylPREDNISolone acetate (DEPO-MEDROL) injection 40 mg    Orders Placed This Encounter  Procedures   XR C-ARM NO REPORT   Epidural Steroid injection    Follow-up: Return for visit to requesting provider as needed.   Procedures: No procedures performed  Lumbar Epidural Steroid Injection - Interlaminar Approach with Fluoroscopic Guidance  Patient: Janice Arnold      Date of Birth: 1981-05-24 MRN: [id number] PCP: Jeani Sow, MD      Visit Date: 10/13/2023   Universal Protocol:     Consent Given By: the patient  Position: PRONE  Additional Comments: Vital signs were monitored before and after the procedure. Patient was prepped and draped in the usual sterile fashion. The correct patient, procedure, and site was verified.   Injection Procedure Details:   Procedure diagnoses: Lumbar radiculopathy [M54.16]   Meds Administered:  Meds ordered this  encounter  Medications   methylPREDNISolone acetate (DEPO-MEDROL) injection 40 mg     Laterality: Right  Location/Site:  L4-5  Needle: 3.5 in., 20 ga. Tuohy  Needle Placement: Paramedian epidural  Findings:   -Comments: Excellent flow of contrast into the epidural space.  Procedure Details: Using a paramedian approach from the side mentioned above, the region overlying the inferior lamina was localized under fluoroscopic visualization and the soft tissues overlying this structure were infiltrated with 4 ml. of 1% Lidocaine without Epinephrine. The Tuohy needle was inserted into the epidural space using a paramedian approach.   The epidural space was localized using loss of resistance along with counter oblique bi-planar fluoroscopic views.  After negative aspirate for air, blood, and CSF, a 2 ml. volume of Isovue-250 was injected into the epidural space and the flow of contrast was observed. Radiographs were obtained for documentation purposes.    The injectate was administered into the level noted above.   Additional Comments:  No complications occurred Dressing: 2 x 2 sterile gauze and Band-Aid    Post-procedure details: Patient was observed during the procedure. Post-procedure instructions were reviewed.  Patient left the clinic in stable condition.   Clinical History: MRI LUMBAR SPINE WITHOUT CONTRAST   TECHNIQUE: Multiplanar, multisequence MR imaging of the lumbar spine was performed. No intravenous contrast was administered.   COMPARISON:  Lumbar spine x-rays dated July 02, 2023.   FINDINGS: Segmentation:  Standard.   Alignment:  Physiologic.   Vertebrae:  No fracture, evidence of discitis, or bone lesion.   Conus medullaris  and cauda equina: Conus extends to the L1-L2 level. Conus and cauda equina appear normal.   Paraspinal and other soft tissues: Negative.   Disc levels:   T12-L1 to L2-L3:  Negative.   L3-L4:  Small shallow right foraminal disc  protrusion.  No stenosis.   L4-L5: Mild disc bulging. Superimposed moderate broad-based right foraminal left subarticular and foraminal disc protrusions. Mild to moderate spinal canal stenosis. Moderate left and mild right lateral recess stenosis. Moderate right and mild left neuroforaminal stenosis.   L5-S1: Small broad-based posterior disc protrusion and mild bilateral facet arthropathy. No stenosis.   IMPRESSION: 1. Multilevel lumbar spondylosis as described above, worst at L4-L5 where there is mild to moderate spinal canal stenosis, moderate left lateral recess stenosis, and moderate right neuroforaminal stenosis.     Electronically Signed   By: Obie Dredge M.D.   On: 09/29/2023 15:12     Objective:  VS:  HT:    WT:   BMI:     BP:118/83  HR:96bpm  TEMP: ( )  RESP:  Physical Exam Vitals and nursing note reviewed.  Constitutional:      General: She is not in acute distress.    Appearance: Normal appearance. She is obese. She is not ill-appearing.  HENT:     Head: Normocephalic and atraumatic.     Right Ear: External ear normal.     Left Ear: External ear normal.  Eyes:     Extraocular Movements: Extraocular movements intact.  Cardiovascular:     Rate and Rhythm: Normal rate.     Pulses: Normal pulses.  Pulmonary:     Effort: Pulmonary effort is normal. No respiratory distress.  Abdominal:     General: There is no distension.     Palpations: Abdomen is soft.  Musculoskeletal:        General: Tenderness present.     Cervical back: Neck supple.     Right lower leg: No edema.     Left lower leg: No edema.     Comments: Patient has good distal strength with no pain over the greater trochanters.  No clonus or focal weakness.  Skin:    Findings: No erythema, lesion or rash.  Neurological:     General: No focal deficit present.     Mental Status: She is alert and oriented to person, place, and time.     Sensory: No sensory deficit.     Motor: No weakness or  abnormal muscle tone.     Coordination: Coordination normal.  Psychiatric:        Mood and Affect: Mood normal.        Behavior: Behavior normal.      Imaging: XR C-ARM NO REPORT Result Date: 10/13/2023 Please see Notes tab for imaging impression.

## 2023-10-14 ENCOUNTER — Other Ambulatory Visit: Payer: Self-pay | Admitting: Family Medicine

## 2023-10-14 DIAGNOSIS — F4323 Adjustment disorder with mixed anxiety and depressed mood: Secondary | ICD-10-CM

## 2023-10-17 ENCOUNTER — Other Ambulatory Visit: Payer: Self-pay | Admitting: Family Medicine

## 2023-10-17 ENCOUNTER — Other Ambulatory Visit: Payer: Self-pay | Admitting: Orthopedic Surgery

## 2023-10-17 ENCOUNTER — Encounter: Payer: Self-pay | Admitting: Family

## 2023-10-21 ENCOUNTER — Other Ambulatory Visit: Payer: Self-pay | Admitting: Radiology

## 2023-10-21 DIAGNOSIS — R7989 Other specified abnormal findings of blood chemistry: Secondary | ICD-10-CM

## 2023-10-22 ENCOUNTER — Other Ambulatory Visit: Payer: Self-pay

## 2023-10-22 ENCOUNTER — Ambulatory Visit (HOSPITAL_COMMUNITY)
Admission: RE | Admit: 2023-10-22 | Discharge: 2023-10-22 | Disposition: A | Discharge status: Home / Self Care | Payer: Medicaid Other | Source: Ambulatory Visit | Attending: Family Medicine

## 2023-10-22 ENCOUNTER — Encounter (HOSPITAL_COMMUNITY): Payer: Self-pay

## 2023-10-22 ENCOUNTER — Ambulatory Visit (HOSPITAL_COMMUNITY)
Admission: RE | Admit: 2023-10-22 | Discharge: 2023-10-22 | Disposition: A | Discharge status: Home / Self Care | Payer: Medicaid Other | Source: Ambulatory Visit | Attending: Gastroenterology | Admitting: Gastroenterology

## 2023-10-22 DIAGNOSIS — R7989 Other specified abnormal findings of blood chemistry: Secondary | ICD-10-CM | POA: Insufficient documentation

## 2023-10-22 DIAGNOSIS — R112 Nausea with vomiting, unspecified: Secondary | ICD-10-CM | POA: Insufficient documentation

## 2023-10-22 DIAGNOSIS — D509 Iron deficiency anemia, unspecified: Secondary | ICD-10-CM | POA: Insufficient documentation

## 2023-10-22 DIAGNOSIS — K700 Alcoholic fatty liver: Secondary | ICD-10-CM | POA: Diagnosis not present

## 2023-10-22 DIAGNOSIS — F419 Anxiety disorder, unspecified: Secondary | ICD-10-CM | POA: Insufficient documentation

## 2023-10-22 DIAGNOSIS — R569 Unspecified convulsions: Secondary | ICD-10-CM | POA: Insufficient documentation

## 2023-10-22 DIAGNOSIS — K7400 Hepatic fibrosis, unspecified: Secondary | ICD-10-CM | POA: Diagnosis not present

## 2023-10-22 DIAGNOSIS — K7581 Nonalcoholic steatohepatitis (NASH): Secondary | ICD-10-CM | POA: Diagnosis not present

## 2023-10-22 DIAGNOSIS — Z5986 Financial insecurity: Secondary | ICD-10-CM | POA: Diagnosis not present

## 2023-10-22 DIAGNOSIS — F1021 Alcohol dependence, in remission: Secondary | ICD-10-CM | POA: Insufficient documentation

## 2023-10-22 DIAGNOSIS — K7401 Hepatic fibrosis, early fibrosis: Secondary | ICD-10-CM | POA: Insufficient documentation

## 2023-10-22 DIAGNOSIS — Z5982 Transportation insecurity: Secondary | ICD-10-CM | POA: Insufficient documentation

## 2023-10-22 LAB — CBC WITH DIFFERENTIAL/PLATELET
Abs Immature Granulocytes: 0.16 10*3/uL — ABNORMAL HIGH (ref 0.00–0.07)
Basophils Absolute: 0.1 10*3/uL (ref 0.0–0.1)
Basophils Relative: 1 %
Eosinophils Absolute: 0.2 10*3/uL (ref 0.0–0.5)
Eosinophils Relative: 2 %
HCT: 41.8 % (ref 36.0–46.0)
Hemoglobin: 13.5 g/dL (ref 12.0–15.0)
Immature Granulocytes: 1 %
Lymphocytes Relative: 23 %
Lymphs Abs: 2.8 10*3/uL (ref 0.7–4.0)
MCH: 29.8 pg (ref 26.0–34.0)
MCHC: 32.3 g/dL (ref 30.0–36.0)
MCV: 92.3 fL (ref 80.0–100.0)
Monocytes Absolute: 0.7 10*3/uL (ref 0.1–1.0)
Monocytes Relative: 6 %
Neutro Abs: 8.6 10*3/uL — ABNORMAL HIGH (ref 1.7–7.7)
Neutrophils Relative %: 67 %
Platelets: 340 10*3/uL (ref 150–400)
RBC: 4.53 MIL/uL (ref 3.87–5.11)
RDW: 13.5 % (ref 11.5–15.5)
WBC: 12.6 10*3/uL — ABNORMAL HIGH (ref 4.0–10.5)
nRBC: 0 % (ref 0.0–0.2)

## 2023-10-22 LAB — COMPREHENSIVE METABOLIC PANEL
ALT: 639 U/L — ABNORMAL HIGH (ref 0–44)
AST: 314 U/L — ABNORMAL HIGH (ref 15–41)
Albumin: 4 g/dL (ref 3.5–5.0)
Alkaline Phosphatase: 59 U/L (ref 38–126)
Anion gap: 13 (ref 5–15)
BUN: 16 mg/dL (ref 6–20)
CO2: 20 mmol/L — ABNORMAL LOW (ref 22–32)
Calcium: 9.4 mg/dL (ref 8.9–10.3)
Chloride: 105 mmol/L (ref 98–111)
Creatinine, Ser: 0.65 mg/dL (ref 0.44–1.00)
GFR, Estimated: >60 mL/min (ref >60)
Glucose, Bld: 114 mg/dL — ABNORMAL HIGH (ref 70–99)
Potassium: 4.2 mmol/L (ref 3.5–5.1)
Sodium: 138 mmol/L (ref 135–145)
Total Bilirubin: 1.3 mg/dL — ABNORMAL HIGH (ref 0.0–1.2)
Total Protein: 8.4 g/dL — ABNORMAL HIGH (ref 6.5–8.1)

## 2023-10-22 LAB — PROTIME-INR
INR: 1.1 (ref 0.8–1.2)
Prothrombin Time: 14 s (ref 11.4–15.2)

## 2023-10-22 MED ORDER — OXYCODONE HCL 5 MG PO TABS
10.0000 mg | ORAL_TABLET | ORAL | Status: DC | PRN
Start: 2023-10-22 — End: 2023-10-23

## 2023-10-22 MED ORDER — FENTANYL CITRATE (PF) 100 MCG/2ML IJ SOLN
INTRAMUSCULAR | Status: AC
  Filled 2023-10-22: qty 2

## 2023-10-22 MED ORDER — FENTANYL CITRATE (PF) 100 MCG/2ML IJ SOLN
INTRAMUSCULAR | Status: AC | PRN
  Administered 2023-10-22 (×2): 50 ug via INTRAVENOUS

## 2023-10-22 MED ORDER — LIDOCAINE HCL 1 % IJ SOLN
INTRAMUSCULAR | Status: AC
  Filled 2023-10-22: qty 20

## 2023-10-22 MED ORDER — MIDAZOLAM HCL 2 MG/2ML IJ SOLN
INTRAMUSCULAR | Status: AC | PRN
  Administered 2023-10-22 (×2): 1 mg via INTRAVENOUS

## 2023-10-22 MED ORDER — GELATIN ABSORBABLE 12-7 MM EX MISC
CUTANEOUS | Status: AC
  Filled 2023-10-22: qty 1

## 2023-10-22 MED ORDER — SODIUM CHLORIDE 0.9 % IV SOLN: INTRAVENOUS | Status: DC

## 2023-10-22 MED ORDER — MIDAZOLAM HCL 2 MG/2ML IJ SOLN
INTRAMUSCULAR | Status: AC
  Filled 2023-10-22: qty 2

## 2023-10-22 NOTE — Progress Notes (Signed)
 Pre-procedure labs send down at 1117

## 2023-10-22 NOTE — Discharge Instructions (Signed)
 Please call Interventional Radiology clinic [phone] with any questions or concerns.  You may remove your dressing and shower tomorrow.  After the procedure, it is common to have: Soreness Bruising Mild pain You may also feel tired for a few days  Follow these instructions at home:  Medication: Do not use Aspirin or ibuprofen products, such as Advil or Motrin, as it may increase bleeding.  You may resume your usual medications as ordered by your doctor If your doctor prescribed antibiotics, take them as directed. Do not stop taking them just because you feel better. You need to take the full course of antibiotics.  Eating and drinking: Drink plenty of liquids to keep your urine pale yellow You can resume your regular diet as directed by your doctor  Care of the procedure site Follow instructions from your doctor about how to take care of your cut from surgery (incisions). Make sure you: Wash your hands with soap and water for at least 20 seconds before and after you change your bandage. If you cannot use soap and water, use hand sanitizer Change your bandage Leave stitches or skin glue in place for at least two weeks Leave tape strips alone unless you are told to take them off. You may trim the edges of the tape strips if they curl up. Check your incision every day for signs of infection. Check for: Redness, swelling, or more pain Fluid or blood Warmth Pus or a bad smell  Activity Rest at home for 1-2 days, or as told by your doctor Get up to take short walks every 1 to 2 hours. Ask for help if you feel weak or unsteady Do not lift anything that is heavier than 10 lb (4.5 kg), or the limit that you are told Do not play contact sports for 2 weeks after the procedure Return to your normal activities as told by your doctor Do not take baths, swim, or use a hot tub until your health care provider approves. Take showers only. Keep all follow-up visits as told by your  doctor  Contact a doctor if: You have more bleeding in your incision Your incision swells, or is red and more painful You have fluid that comes from your incision You develop a rash You have fever or chills  Get help right away if: You have swelling, bloating, or pain in your belly (abdomen) You get dizzy or faint You vomit or you feel like vomiting You have trouble breathing or feel short of breath You have chest pain You have problems talking or seeing You have trouble with your balance or moving your arms or legs

## 2023-10-22 NOTE — H&P (Signed)
 Chief Complaint: Elevated LFTs, request for image guided diagnostic liver biopsy  Referring Provider(s): Cunningham,Scott E   Supervising Physician: Roanna Banning  Patient Status: [facility] - Out-pt  History of Present Illness: Janice Arnold is a 43 y.o. female with a history of chronic N/V, anxiety, chronic alcohol abuse/alcohol induced fatty liver, iron deficiency anemia and seizures . She is followed by Dr. Tomasa Rand with GI.  While at cancer center on 09/30/2023 elevated liver function test noted by Clent Jacks, PA; directed to present to ER.  Patient left without being seen from ER.  An abdominal ultrasound was ordered which resulted with nothing to explain elevated liver function tests. Patient's primary care doctor contacted GI, requested image guided liver biopsy to further explore etiology of elevated liver function tests.  Patient is Full Code  Past Medical History:  Diagnosis Date   Alcohol abuse    history of heavy alcohol abuse, last drank alcohol in 06/2022   Anemia 2023   s/p iron infusions   Anxiety    Follows with PCP. Dr. Dewitt Rota.   Compression fracture of body of thoracic vertebra (HCC) 11/05/2022   T4, T5, T6 / Follows w/ orthopedic, Dr. Willia Craze. Fractures occurred while patient was having a seizure, per patient.   Cyclic vomiting syndrome    hospital admission on 05/28/22 for vomiting, follows with gastroenterologist Dr. Tiajuana Amass.   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 Tiajuana Amass.   GERD (gastroesophageal reflux disease)    takes omeprazole daily   Heart murmur    since childhood   Hyperlipidemia    11/20/22 lipid panel in Epic   Hypokalemia 10/15/2022   K+ level 2.5   Menorrhagia    Palpitations    Follows w/ cardiology, Anice Paganini, NP. 11/14/2022 echocardiogram EF 60 65%, 12/31/22 Event heart  monitor results in Epic.   PONV (postoperative nausea and vomiting)    Prolonged QT interval    Follows w/ cardiology, Bernadene Person, NP.   Seizure (HCC) 06/06/2022   Follows with neurologist, Dr. Leim Fabry @ [facility]. Seizures were thought to be related to stopping alcohol. However, patient has had seizures months after quitting alcohol. As of 02/06/23, last seizure was on 11/07/22. Keppra was then increased to bid.   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 See 11/05/22 Head CT and MRI in Epic. Resolved per pt on 02/06/23.   Tremors of nervous system    whole body tremors, follows with Dr. Leim Fabry @ [facility].   Vitamin deficiency 2023   B12 deficiency, receiving monthly B12 injections / Vitamin D deficiency, taking supplement every two weeks   Wears contact lenses    Wears glasses     Past Surgical History:  Procedure Laterality Date   COLONOSCOPY WITH ESOPHAGOGASTRODUODENOSCOPY (EGD)  03/27/2022   one rectal polyp   LAPAROSCOPIC APPENDECTOMY N/A 03/02/2022   Procedure: APPENDECTOMY LAPAROSCOPIC;  Surgeon: Manus Rudd, MD;  Location: [facility];  Service: General;  Laterality: N/A;   ROBOTIC ASSISTED LAPAROSCOPIC HYSTERECTOMY AND SALPINGECTOMY Bilateral 03/04/2023   Procedure: ABORTED XI ROBOTIC ASSISTED LAPAROSCOPIC HYSTERECTOMY AND SALPINGECTOMY;  Surgeon: Olivia Mackie, MD;  Location: [facility];  Service: Gynecology;  Laterality: Bilateral;  Requests 2/1/2 hrs.    Allergies: Patient has no known  allergies.  Medications: Prior to Admission medications   Medication Sig Start Date End Date Taking? Authorizing Provider  amitriptyline (ELAVIL) 10 MG tablet TAKE 1 TABLET BY MOUTH EVERYDAY AT BEDTIME 09/12/23   Jenel Lucks, MD  busPIRone (BUSPAR) 5 MG tablet TAKE 1 TABLET BY MOUTH TWICE A DAY 10/14/23   Jeani Sow, MD  DULoxetine (CYMBALTA) 60 MG capsule Take 1  capsule (60 mg total) by mouth daily. 09/17/23   Jeani Sow, MD  ezetimibe (ZETIA) 10 MG tablet Take 1 tablet (10 mg total) by mouth daily. 10/21/22 10/16/23  Lennette Bihari, MD  hydrochlorothiazide (HYDRODIURIL) 25 MG tablet Take 1 tablet (25 mg total) by mouth daily as needed. 09/17/23   Jeani Sow, MD  HYDROcodone-acetaminophen (NORCO/VICODIN) 5-325 MG tablet Take 1 tablet by mouth every 6 (six) hours as needed for moderate pain. 03/04/23   Olivia Mackie, MD  hydrOXYzine (VISTARIL) 25 MG capsule Take 1 capsule (25 mg total) by mouth every 6 (six) hours as needed. 09/17/23   Jeani Sow, MD  KLOR-CON M20 20 MEQ tablet TAKE 2 TABLETS (40 MEQ TOTAL) BY MOUTH 3 (THREE) TIMES DAILY. 07/25/23   Jeani Sow, MD  levETIRAcetam (KEPPRA) 750 MG tablet Take 750 mg by mouth 2 (two) times daily. 1 tablet twice a day    [provider]  lidocaine (LIDODERM) 5 % PLACE 1 PATCH ONTO THE SKIN DAILY. REMOVE & DISCARD PATCH WITHIN 12 HOURS OR AS DIRECTED BY MD 09/16/23   London Sheer, MD  methocarbamol (ROBAXIN) 500 MG tablet TAKE 1 TABLET (500 MG TOTAL) BY MOUTH EVERY 8 (EIGHT) HOURS AS NEEDED (PAIN MUSCLE SPASMS). 10/19/23   London Sheer, MD  metoCLOPramide (REGLAN) 10 MG tablet Take 1 tablet (10 mg total) by mouth every 8 (eight) hours as needed for nausea. 06/27/23   Bethann Berkshire, MD  metoprolol succinate (TOPROL-XL) 25 MG 24 hr tablet TAKE 1 TABLET (25 MG TOTAL) BY MOUTH DAILY. 07/03/23   Lennette Bihari, MD  norethindrone (AYGESTIN) 5 MG tablet Take by mouth daily.    [provider]  norgestimate-ethinyl estradiol (ORTHO-CYCLEN) 0.25-35 MG-MCG tablet Take 1 tablet by mouth daily. 12/12/22   [provider]  omeprazole (PRILOSEC) 40 MG capsule Take 1 capsule (40 mg total) by mouth daily. 07/02/23   Jeani Sow, MD  ondansetron (ZOFRAN-ODT) 4 MG disintegrating tablet TAKE 1 TABLET BY MOUTH EVERY 8 HOURS AS NEEDED FOR NAUSEA AND VOMITING 10/18/23   Worthy Rancher B,  FNP  oxyCODONE (OXY IR/ROXICODONE) 5 MG immediate release tablet Take 5 mg by mouth every 4 (four) hours as needed for severe pain. 1-2 tablets every 4 hrs as needed.    [provider]  promethazine (PHENERGAN) 25 MG suppository Place 1 suppository (25 mg total) rectally every 6 (six) hours as needed for nausea or vomiting. 08/07/23   Jeani Sow, MD  promethazine (PHENERGAN) 25 MG tablet TAKE 1 TABLET (25 MG TOTAL) BY MOUTH DAILY. 08/25/23   Jeani Sow, MD  spironolactone (ALDACTONE) 25 MG tablet Take 1 tablet (25 mg total) by mouth daily. 12/23/22   Jeani Sow, MD  triamcinolone cream (KENALOG) 0.1 % Apply 1 Application topically 2 (two) times daily. Patient taking differently: Apply 1 Application topically 2 (two) times daily. As needed 03/05/23   Jeani Sow, MD  Vitamin D, Ergocalciferol, (DRISDOL) 1.25 MG (50000 UNIT) CAPS capsule TAKE 1 CAPSULE (50,000 UNITS TOTAL) BY MOUTH EVERY 7 (SEVEN) DAYS  09/22/23   Jeani Sow, MD     Family History  Problem Relation Age of Onset   Breast cancer Mother    Cervical cancer Mother        ovarian   Bone cancer Mother    Breast cancer Maternal Grandmother    Colon cancer Maternal Great-grandmother 71   Esophageal cancer Neg Hx    Stomach cancer Neg Hx    Seizures Neg Hx    Stroke Neg Hx     Social History   Socioeconomic History   Marital status: Married    Spouse name: Not on file   Number of children: 3   Years of education: Not on file   Highest education level: Associate degree: occupational, Scientist, product/process development, or vocational program  Occupational History   Occupation: Emergency planning/management officer  Tobacco Use   Smoking status: Never   Smokeless tobacco: Never  Vaping Use   Vaping status: Never Used  Substance and Sexual Activity   Alcohol use: Not Currently    Comment: History of heavy alcohol abuse. Quit in 2023. Last drank alcohol in 06/2022.   Drug use: Never   Sexual activity: Yes    Birth control/protection:  Pill  Other Topics Concern   Not on file  Social History Narrative   Project mgr   Social Drivers of Health   Financial Resource Strain: High Risk (09/05/2023)   Overall Financial Resource Strain (CARDIA)    Difficulty of Paying Living Expenses: Hard  Food Insecurity: Patient Declined (09/05/2023)   Hunger Vital Sign    Worried About Running Out of Food in the Last Year: Patient declined    Ran Out of Food in the Last Year: Patient declined  Transportation Needs: Unmet Transportation Needs (09/05/2023)   PRAPARE - Administrator, Civil Service (Medical): Yes    Lack of Transportation (Non-Medical): Patient declined  Physical Activity: Unknown (09/05/2023)   Exercise Vital Sign    Days of Exercise per Week: 0 days    Minutes of Exercise per Session: Not on file  Stress: Stress Concern Present (09/05/2023)   [employer] of Occupational Health - Occupational Stress Questionnaire    Feeling of Stress : Very much  Social Connections: Moderately Isolated (09/05/2023)   Social Connection and Isolation Panel [NHANES]    Frequency of Communication with Friends and Family: Three times a week    Frequency of Social Gatherings with Friends and Family: Patient declined    Attends Religious Services: Never    Database administrator or Organizations: No    Attends Engineer, structural: Not on file    Marital Status: Married     Review of Systems  Constitutional:  Negative for chills and fever.  Respiratory:  Negative for chest tightness and shortness of breath.   Cardiovascular:  Negative for chest pain.  Gastrointestinal:  Positive for abdominal pain. Negative for nausea and vomiting.  Genitourinary:  Negative for difficulty urinating and dysuria.  Hematological:  Does not bruise/bleed easily.    Vital Signs: BP 120/77 (BP Location: Right Arm)   Pulse 97   Temp 97.9 F (36.6 C) (Oral)   Resp 16   LMP 01/29/2023 (Approximate)   SpO2 98%     Physical  Exam HENT:     Mouth/Throat:     Mouth: Mucous membranes are dry.     Pharynx: Oropharynx is clear.  Cardiovascular:     Rate and Rhythm: Normal rate and regular rhythm.  Pulses: Normal pulses.     Heart sounds: Normal heart sounds.  Pulmonary:     Effort: Pulmonary effort is normal.     Breath sounds: Normal breath sounds.  Abdominal:     Palpations: Abdomen is soft.     Tenderness: There is abdominal tenderness. There is no guarding.     Comments: Right upper quadrant tenderness with palpation  Musculoskeletal:     Right lower leg: No edema.     Left lower leg: No edema.  Skin:    General: Skin is warm and dry.  Neurological:     Mental Status: She is alert and oriented to person, place, and time.  Psychiatric:        Mood and Affect: Mood normal.        Behavior: Behavior normal.      Imaging: XR C-ARM NO REPORT Result Date: 10/13/2023 Please see Notes tab for imaging impression.  Epidural Steroid injection Result Date: 10/13/2023 Tyrell Antonio, MD     10/13/2023 11:12 AM Lumbar Epidural Steroid Injection - Interlaminar Approach with Fluoroscopic Guidance Patient: DEREKA LUERAS     Date of Birth: Jun 25, 1981 MRN: [id number] PCP: Jeani Sow, MD     Visit Date: 10/13/2023  Universal Protocol:   Consent Given By: the patient Position: PRONE Additional Comments: Vital signs were monitored before and after the procedure. Patient was prepped and draped in the usual sterile fashion. The correct patient, procedure, and site was verified. Injection Procedure Details: Procedure diagnoses: Lumbar radiculopathy [M54.16] Meds Administered: Meds ordered this encounter Medications  methylPREDNISolone acetate (DEPO-MEDROL) injection 40 mg  Laterality: Right Location/Site:  L4-5 Needle: 3.5 in., 20 ga. Tuohy Needle Placement: Paramedian epidural Findings:  -Comments: Excellent flow of contrast into the epidural space. Procedure Details: Using a paramedian approach from the side mentioned  above, the region overlying the inferior lamina was localized under fluoroscopic visualization and the soft tissues overlying this structure were infiltrated with 4 ml. of 1% Lidocaine without Epinephrine. The Tuohy needle was inserted into the epidural space using a paramedian approach. The epidural space was localized using loss of resistance along with counter oblique bi-planar fluoroscopic views.  After negative aspirate for air, blood, and CSF, a 2 ml. volume of Isovue-250 was injected into the epidural space and the flow of contrast was observed. Radiographs were obtained for documentation purposes.  The injectate was administered into the level noted above. Additional Comments: No complications occurred Dressing: 2 x 2 sterile gauze and Band-Aid  Post-procedure details: Patient was observed during the procedure. Post-procedure instructions were reviewed. Patient left the clinic in stable condition.  US Abdomen Limited RUQ (LIVER/GB) Result Date: 10/01/2023 CLINICAL DATA:  Elevated LFTs EXAM: ULTRASOUND ABDOMEN LIMITED RIGHT UPPER QUADRANT COMPARISON:  Ultrasound abdomen 08/11/2023 FINDINGS: Gallbladder: No gallstones or wall thickening visualized. No sonographic Murphy sign noted by sonographer. Common bile duct: Diameter: 3.1 mm Liver: No focal lesion identified. Within normal limits in parenchymal echogenicity. Portal vein is patent on color Doppler imaging with normal direction of blood flow towards the liver. Other: None. IMPRESSION: No cholelithiasis or sonographic evidence for acute cholecystitis. Electronically Signed   By: Annia Belt M.D.   On: 10/01/2023 11:52    Labs:  CBC: Recent Labs    08/07/23 1142 09/17/23 1105 09/30/23 1111 10/03/23 0928  WBC 6.8 10.4 11.8* 8.7  HGB 12.0 12.9 12.6 12.9  HCT 36.6 38.9 38.6 39.4  PLT 219.0 340.0 295 298.0    COAGS: No results for input(s): "INR", "  APTT" in the last 8760 hours.  BMP: Recent Labs    02/24/23 1100 06/26/23 1000  06/27/23 1037 07/02/23 1146 09/17/23 1105 09/30/23 1111 10/03/23 0928 10/09/23 0922  NA 138 139 135   < > 137 138 136 135  K 3.2* 2.9* 2.6*   < > 4.5 4.1 4.1 4.2  CL 98 97* 99   < > 104 104 103 104  CO2 25 30 25    < > 21 26 24 23   GLUCOSE 100* 127* 108*   < > 100* 82 103* 101*  BUN 11 7 6    < > 16 11 10 11   CALCIUM 9.2 8.7* 7.9*   < > 9.5 9.9 9.8 9.4  CREATININE 1.06* 0.66 0.61   < > 0.87 0.69 0.65 0.61  GFRNONAA >60 >60 >60  --   --  >60  --   --    < > = values in this interval not displayed.    LIVER FUNCTION TESTS: Recent Labs    09/17/23 1105 09/30/23 1111 10/03/23 0928 10/09/23 0922  BILITOT 1.0 0.9 1.1 0.9  AST 221* 336* 275* 309*  ALT 271* 659* 609* 502*  ALKPHOS 71 64 64 60  PROT 7.9 7.6 7.9 7.8  ALBUMIN 4.4 4.4 4.3 4.1    TUMOR MARKERS: No results for input(s): "AFPTM", "CEA", "CA199", "CHROMGRNA" in the last 8760 hours.  Assessment and Plan:  Janice Arnold is a 43 y.o. female with a history of chronic N/V, anxiety, chronic alcohol abuse/alcohol induced fatty liver, iron deficiency anemia and seizures . She is followed by Dr. Tomasa Rand with GI.  While at cancer center on 09/30/2023 elevated liver function test noted by Clent Jacks, PA; directed to present to ER.  Patient left without being seen from ER.  An abdominal ultrasound was ordered after this by PCP which resulted with nothing to explain elevated liver function tests. Patient's primary care doctor contacted GI, requested image guided liver biopsy to further explore etiology of elevated liver function tests. -IR to perform requested image guided liver biopsy to assess elevated liver function tests (AST 336, ALT 659 on 09/30/2023) - Abdominal ultrasound on 10/01/2023 unremarkable -3/12 CMP and PT/INR in process, WBC 12.6, platelets 340, hemoglobin 13.5 -denies fever, positive right upper quadrant pain today without nausea/vomiting - Patient has been n.p.o. since midnight, has a driver and supervision  for the next 24 hours Risks and benefits of image guided liver biopsy was discussed with the patient and/or patient's family including, but not limited to bleeding, infection, damage to adjacent structures or low yield requiring additional tests.  All of the questions were answered and there is agreement to proceed.  Consent signed and in chart.  Thank you for allowing our service to participate in PHYLLISS STREGE 's care.    Electronically Signed: Carlton Adam, NP   10/22/2023, 11:29 AM     I spent a total of  30 Minutes   in face to face in clinical consultation, greater than 50% of which was counseling/coordinating care for image guided liver biopsy ordered for elevated liver function tests   (A copy of this note was sent to the referring provider and the time of visit.)

## 2023-10-22 NOTE — Procedures (Signed)
 Vascular and Interventional Radiology Procedure Note  Patient: Janice Arnold DOB: 09-23-1980 Medical Record Number: [id number] Note Date/Time: 10/22/23 12:31 PM   Performing Physician: Roanna Banning, MD Assistant(s): None  Diagnosis: >LFTs   Procedure: LIVER BIOPSY, NON-TARGETED  Anesthesia: Conscious Sedation Complications: None Estimated Blood Loss: Minimal Specimens: Sent for Pathology  Findings:  Successful Ultrasound-guided biopsy of liver. A total of 3 samples were obtained. Hemostasis of the tract was achieved using Manual Pressure.  Plan: Bed rest for 2 hours.  See detailed procedure note with images in PACS. The patient tolerated the procedure well without incident or complication and was returned to Recovery in stable condition.    Roanna Banning, MD [facility]   Pager. [phone] Clinic. [phone]

## 2023-10-23 LAB — SURGICAL PATHOLOGY

## 2023-10-24 ENCOUNTER — Ambulatory Visit: Payer: Medicaid Other | Admitting: Family Medicine

## 2023-10-27 ENCOUNTER — Ambulatory Visit (INDEPENDENT_AMBULATORY_CARE_PROVIDER_SITE_OTHER): Payer: Medicaid Other | Admitting: Orthopedic Surgery

## 2023-10-27 DIAGNOSIS — M545 Low back pain, unspecified: Secondary | ICD-10-CM

## 2023-10-27 MED ORDER — TRAMADOL HCL 50 MG PO TABS: 50.0000 mg | ORAL_TABLET | Freq: Four times a day (QID) | ORAL | 0 refills | Status: AC | PRN

## 2023-10-27 NOTE — Progress Notes (Signed)
 Orthopedic Spine Surgery Office Note     Assessment: Patient is a 43 y.o. female with 2 issues today:   1) lower lumbar back pain with no radicular symptoms.  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    ___________________________________________________________________________   History: Patient is a 43 y.o. female who comes in today to talk about her thoracic and lower back pain.  Patient states she still has been having significant low back pain.  After last visit, she got an injection with Dr. Alvester Morin.  She said she got relief for about a day or 2. She is not having any radiating leg pain. No weakness in her legs.  She also reports that her thoracic pain has flared back up. Also, not having any radiating leg pain. No symptoms of unsteadiness or instability.    Previous treatments: robaxin, PT, medrol dosepak, tylenol, salonpas     Physical Exam:   General: no acute distress, appears stated age Neurologic: alert, answering questions appropriately, following commands Respiratory: unlabored breathing on room air, symmetric chest rise Psychiatric: appropriate affect, normal cadence to speech     MSK (spine):   -Strength exam                                                   Left                  Right EHL                              5/5                  5/5 TA                                 5/5                  5/5 GSC                             5/5                   5/5 Knee extension            5/5                  5/5 Hip flexion                    5/5                  5/5   -Sensory exam  Sensation intact to light touch in L3-S1 nerve distributions of bilateral lower extremities     Imaging: XRs of the lumbar spine from 07/02/2023 were previously independently reviewed and interpreted,showing disc height loss at L4/5.  No other significant degenerative changes seen.  No fracture or dislocation seen.  No evidence of instability on flexion/extension views.   MRI of the lumbar spine from 09/17/2023 was previously independently reviewed and interpreted, showing disc desiccation with a right paracentral disc herniation at L4/5.  Central stenosis, left lateral recess stenosis, and right-sided foraminal stenosis at L4/5.  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  No other significant stenosis seen.     Patient name: Janice Arnold Patient MRN: [id number] Date of visit: 10/27/23

## 2023-10-29 ENCOUNTER — Encounter: Payer: Self-pay | Admitting: Gastroenterology

## 2023-10-29 NOTE — Progress Notes (Signed)
 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  This is most likely secondary to alcohol.  Although metabolic liver disease can look identical, you do not have risk factors for aggressive metabolic liver disease (such as diabetes), and also your other labs were more suggestive of alcohol induced liver injury (macrocytosis, elevated GGT) and your significantly elevated phosphatidylethanol level in January when you reported longstanding abstinence suggesterd ongoing alcohol use at that time.  Your current liver enzyme elevation may be more likely to be medication-induced.  Many medications can cause liver enzymes to be elevated, including Tylenol and over the counter pain relievers (NSAIDs).   You are on many medications, and it may be hard to pinpoint the offending agent right now.  I would recommend you avoid all NSAIDs, and limit Tylenol to no more than 2 g/day.   Bridging fibrosis is a term that indicates early cirrhosis, indicating that there is extensive scarring from years of liver injury.  This is most likely from chronic heavy alcohol use.  At this early stage, the liver has good opportunity to completely heal and return to normal.  This can only happen with complete alcohol abstinence.  Any ongoing alcohol use, even a little, is too much.  I would like to repeat your liver enzymes in 1 month to see if they are starting to improve.  I would also like to repeat the phosphatidylethanol test at that time as well. If the liver enzymes are still elevated like they are currently, we may need to discuss stopping some of your prescription medicines.  Maya,  Please place an order for the following labs in 1 month: Hepatic panel GGT Phosphatidyl ethanol (Peth)

## 2023-10-30 ENCOUNTER — Ambulatory Visit: Payer: Medicaid Other | Admitting: Gastroenterology

## 2023-10-30 ENCOUNTER — Other Ambulatory Visit: Payer: Self-pay | Admitting: Orthopedic Surgery

## 2023-10-30 ENCOUNTER — Other Ambulatory Visit: Payer: Self-pay

## 2023-10-30 ENCOUNTER — Encounter: Payer: Self-pay | Admitting: Gastroenterology

## 2023-10-30 ENCOUNTER — Other Ambulatory Visit (INDEPENDENT_AMBULATORY_CARE_PROVIDER_SITE_OTHER)

## 2023-10-30 VITALS — BP 118/78 | HR 93 | Ht 67.0 in | Wt 244.0 lb

## 2023-10-30 DIAGNOSIS — R1115 Cyclical vomiting syndrome unrelated to migraine: Secondary | ICD-10-CM

## 2023-10-30 DIAGNOSIS — K719 Toxic liver disease, unspecified: Secondary | ICD-10-CM

## 2023-10-30 DIAGNOSIS — K7030 Alcoholic cirrhosis of liver without ascites: Secondary | ICD-10-CM

## 2023-10-30 DIAGNOSIS — R7989 Other specified abnormal findings of blood chemistry: Secondary | ICD-10-CM | POA: Diagnosis not present

## 2023-10-30 LAB — GAMMA GT: GGT: 175 U/L — ABNORMAL HIGH (ref 7–51)

## 2023-10-30 MED ORDER — AMITRIPTYLINE HCL 10 MG PO TABS: 20.0000 mg | ORAL_TABLET | Freq: Every day | ORAL | 1 refills | Status: DC

## 2023-10-30 NOTE — Progress Notes (Unsigned)
 Discussed the use of AI scribe software for clinical note transcription with the patient, who gave verbal consent to proceed.  HPI : Janice Arnold is a 43 y.o. female with a history of anxiety, chronic alcohol abuse, iron deficiency anemia, seizures and suspected cyclic vomiting syndrome who presents for follow-up after recent liver biopsy.  She was last seen in our office January 7.  At that time a phosphatidyl ethanol level was checked and was significantly elevated.  The patient had denied any alcohol use for over a year.  The liver biopsy showed evidence of steatohepatitis with bridging fibrosis.  She has had significant elevation in her liver enzymes recently, with ALT in the 500-600s for the past month.  Previously, her liver enzyme elevation have been more in the pattern of an AST predominant mild liver enzyme elevation.  Additionally, the patient had a previous macrocytosis which has since resolved.  The patient maintains that she has not drank any alcohol in over a year.  She believes that the phosphatidyl ethanol levels are erroneous.  She has been taking 1000 mg of Tylenol and 400 mg of Advil every eight hours for back pain for the past several weeks but has since reduced her Tylenol intake to 2000 mg per day and stopped Advil. She is now prescribed tramadol for pain management.  She had previously been on Wellbutrin, which was stopped following a seizure.  However, she has recently restarted Wellbutrin with the approval of her neurologist.  She has a history of suspected cyclic vomiting syndrome (CVS).  At her last visit, she was started on low-dose Elavil.  She reports improvement in her nausea and vomiting symptoms, with flare-ups now lasting about two days but occurring more frequently every two to two and a half weeks.  Previously, she reports having severe symptoms that would last several days, but they were less frequent (maybe once a month).  Her diet is irregular, and  physical activity is limited due to back pain, leading to weight fluctuations. She is concerned about weight gain affecting her liver condition. She primarily drinks water but occasionally consumes soda and is aware of the need to reduce sugar and carbohydrate intake.  Her husband is hospitalized with Wernicke's encephalopathy and other health issues, causing her stress. She has three children aged 50, 2, and 29, who are coping with their father's condition.    Liver biopsy March 2025 A. LIVER, BENIGN, BIOPSY: Steatohepatitis with perisinusoidal and periportal and early bridging fibrosis Kathaleen Grinder stage 2-3 / 4). See comment.  COMMENT:  The lobular hepatic architecture is relatively preserved. The lobules reveal patchy macrovesicular steatosis accounting for approximately 5% of overall parenchymal volume. Ballooned hepatocytes and Mallory hyaline inclusions are identified without acidophil bodies. Patchy lobular inflammation is seen. The portal tracts do not show evidence of significant portal inflammation, interlobular bile duct injury, bile ductular reaction, ductopenia or granulomas.  Trichrome stain highlights perisinusoidal and periportal and early bridging fibrosis. Iron stain is negative for iron deposit.  PAS/D stains reveal ceroid laden macrophages and is negative for diagnostic cytoplasmic globules. Reticulin stain reveals an intact reticulin framework.  Overall, the histologic findings demonstrate mild steatohepatitis with perisinusoidal and periportal and early bridging fibrosis. As you know, steatohepatitis is a pattern of injury that may be seen in association with a variety of conditions, including alcohol, metabolic syndrome, obesity, medication/drug-induced injury, Wilson's disease, among others. If non-alcoholic fatty liver disease (NAFLD) is clinically favored, the histologic findings correlate to an NAFLD Activity Score (NAS)  of 4 of 8 (steatosis: 1 of 3;  ballooning: 2 of 2; inflammation: 1 of 3) with Kleiner stage 2-3 of 4 fibrosis.     Aug 2023 EGD for IDA, N&V --Erosive gastropathy. Biopsied   Aug 2023 colonoscopy  Diverticulosis in ascending colon. One 8 mm rectal polyp was removed. Examined portion of ileum was normal .No abnormalities to explain weight loss, symptoms.    Surgical [P], duodenal BENIGN DUODENAL MUCOSA WITH NO DIAGNOSTIC ABNORMALITY 2. 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 PYLORI, INTESTINAL METAPLASIA, DYSPLASIA AND CARCINOMA 3. Surgical [P], gastric body MILD CHRONIC GASTRITIS WITH REACTIVE EPITHELIAL CHANGES NEGATIVE FOR H. PYLORI, INTESTINAL METAPLASIA, DYSPLASIA AND CARCINOMA 4. Surgical [P], colon, rectum, polyp (1) TUBULAR ADENOMA NEGATIVE FOR HIGH-GRADE DYSPLASIA AND CARCINOMA       Right upper quadrant ultrasound August 11, 2023 IMPRESSION: 1. Contracted gallbladder. No cholelithiasis or sonographic evidence for acute cholecystitis. 2. Increased hepatic parenchymal echogenicity suggestive of steatosis.     Gastric emptying study August 08, 2023 FINDINGS: Expected location of the stomach in the left upper quadrant. Ingested meal empties the stomach gradually over the course of the study.   5% emptied at 1 hr ( normal >= 10%)   49% emptied at 2 hr ( normal >= 40%)   95% emptied at 3 hr ( normal >= 70%)   IMPRESSION: No convincing scintigraphic evidence of delayed gastric emptying.     Right upper quadrant ultrasound May 29, 2022 IMPRESSION: Fatty liver.   No acute findings     CT abdomen/pelvis with contrast March 02, 2022 IMPRESSION: 1. Examination is positive for acute appendicitis. No signs of perforation or abscess formation. 2. Hepatosplenomegaly   CT abdomen/pelvis with contrast February 25, 2022 IMPRESSION: 1. Abnormal appearance of the appendix suggesting acute appendicitis. Adjacent wall  thickening of the base of the cecum. 2. Hepatomegaly and hepatic steatosis. 3. Splenomegaly. 4. Left renal calculus  Past Medical History:  Diagnosis Date   Alcohol abuse    history of heavy alcohol abuse, last drank alcohol in 06/2022   Anemia 2023   s/p iron infusions   Anxiety    Follows with PCP. Dr. Dewitt Rota.   Compression fracture of body of thoracic vertebra (HCC) 11/05/2022   T4, T5, T6 / Follows w/ orthopedic, Dr. Willia Craze. Fractures occurred while patient was having a seizure, per patient.   Cyclic vomiting syndrome    hospital admission on 05/28/22 for vomiting, follows with gastroenterologist Dr. Tiajuana Amass.   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 Tiajuana Amass.   GERD (gastroesophageal reflux disease)    takes omeprazole daily   Heart murmur    since childhood   Hyperlipidemia    11/20/22 lipid panel in Epic   Hypokalemia 10/15/2022   K+ level 2.5   Menorrhagia    Palpitations    Follows w/ cardiology, Anice Paganini, NP. 11/14/2022 echocardiogram EF 60 65%, 12/31/22 Event heart monitor results in Epic.   PONV (postoperative nausea and vomiting)    Prolonged QT interval    Follows w/ cardiology, Bernadene Person, NP.   Seizure (HCC) 06/06/2022   Follows with neurologist, Dr. Leim Fabry @ [facility]. Seizures were thought to be related to stopping alcohol. However, patient has had seizures months after quitting alcohol. As of 02/06/23, last seizure  was on 11/07/22. Keppra was then increased to bid.   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 See 11/05/22 Head CT and MRI in Epic. Resolved per pt on 02/06/23.   Tremors of nervous system    whole body tremors, follows with Dr. Leim Fabry @ [facility].   Vitamin deficiency 2023   B12 deficiency,  receiving monthly B12 injections / Vitamin D deficiency, taking supplement every two weeks   Wears contact lenses    Wears glasses      Past Surgical History:  Procedure Laterality Date   COLONOSCOPY WITH ESOPHAGOGASTRODUODENOSCOPY (EGD)  03/27/2022   one rectal polyp   LAPAROSCOPIC APPENDECTOMY N/A 03/02/2022   Procedure: APPENDECTOMY LAPAROSCOPIC;  Surgeon: Manus Rudd, MD;  Location: [facility];  Service: General;  Laterality: N/A;   ROBOTIC ASSISTED LAPAROSCOPIC HYSTERECTOMY AND SALPINGECTOMY Bilateral 03/04/2023   Procedure: ABORTED XI ROBOTIC ASSISTED LAPAROSCOPIC HYSTERECTOMY AND SALPINGECTOMY;  Surgeon: Olivia Mackie, MD;  Location: [facility];  Service: Gynecology;  Laterality: Bilateral;  Requests 2/1/2 hrs.   Family History  Problem Relation Age of Onset   Breast cancer Mother    Cervical cancer Mother        ovarian   Bone cancer Mother    Breast cancer Maternal Grandmother    Colon cancer Maternal Great-grandmother 69   Esophageal cancer Neg Hx    Stomach cancer Neg Hx    Seizures Neg Hx    Stroke Neg Hx    Social History   Tobacco Use   Smoking status: Never   Smokeless tobacco: Never  Vaping Use   Vaping status: Never Used  Substance Use Topics   Alcohol use: Not Currently    Comment: History of heavy alcohol abuse. Quit in 2023. Last drank alcohol in 06/2022.   Drug use: Never   Current Outpatient Medications  Medication Sig Dispense Refill   amitriptyline (ELAVIL) 10 MG tablet TAKE 1 TABLET BY MOUTH EVERYDAY AT BEDTIME 90 tablet 1   busPIRone (BUSPAR) 5 MG tablet TAKE 1 TABLET BY MOUTH TWICE A DAY 180 tablet 0   DULoxetine (CYMBALTA) 60 MG capsule Take 1 capsule (60 mg total) by mouth daily. 90 capsule 1   hydrochlorothiazide (HYDRODIURIL) 25 MG tablet Take 1 tablet (25 mg total) by mouth daily as needed. 90 tablet 0   hydrOXYzine (VISTARIL) 25 MG capsule Take 1 capsule (25 mg total) by mouth every 6 (six) hours as needed. 360 capsule 1    KLOR-CON M20 20 MEQ tablet TAKE 2 TABLETS (40 MEQ TOTAL) BY MOUTH 3 (THREE) TIMES DAILY. 540 tablet 1   levETIRAcetam (KEPPRA) 750 MG tablet Take 750 mg by mouth 2 (two) times daily. 1 tablet twice a day     lidocaine (LIDODERM) 5 % PLACE 1 PATCH ONTO THE SKIN DAILY. REMOVE & DISCARD PATCH WITHIN 12 HOURS OR AS DIRECTED BY MD 30 patch 0   methocarbamol (ROBAXIN) 500 MG tablet TAKE 1 TABLET (500 MG TOTAL) BY MOUTH EVERY 8 (EIGHT) HOURS AS NEEDED (PAIN MUSCLE SPASMS). 120 tablet 1   metoCLOPramide (REGLAN) 10 MG tablet Take 1 tablet (10 mg total) by mouth every 8 (eight) hours as needed for nausea. 30 tablet 0   metoprolol succinate (TOPROL-XL) 25 MG 24 hr tablet TAKE 1 TABLET (25 MG TOTAL) BY MOUTH DAILY. 90 tablet 3   norethindrone (AYGESTIN) 5 MG tablet Take by mouth daily.     norgestimate-ethinyl estradiol (ORTHO-CYCLEN)  0.25-35 MG-MCG tablet Take 1 tablet by mouth daily.     omeprazole (PRILOSEC) 40 MG capsule Take 1 capsule (40 mg total) by mouth daily. 90 capsule 1   ondansetron (ZOFRAN-ODT) 4 MG disintegrating tablet TAKE 1 TABLET BY MOUTH EVERY 8 HOURS AS NEEDED FOR NAUSEA AND VOMITING 20 tablet 1   promethazine (PHENERGAN) 25 MG suppository Place 1 suppository (25 mg total) rectally every 6 (six) hours as needed for nausea or vomiting. 12 each 3   promethazine (PHENERGAN) 25 MG tablet TAKE 1 TABLET (25 MG TOTAL) BY MOUTH DAILY. 90 tablet 0   spironolactone (ALDACTONE) 25 MG tablet Take 1 tablet (25 mg total) by mouth daily. 90 tablet 3   traMADol (ULTRAM) 50 MG tablet Take 1 tablet (50 mg total) by mouth every 6 (six) hours as needed for up to 5 days. 20 tablet 0   triamcinolone cream (KENALOG) 0.1 % Apply 1 Application topically 2 (two) times daily. (Patient taking differently: Apply 1 Application topically 2 (two) times daily. As needed) 30 g 0   Vitamin D, Ergocalciferol, (DRISDOL) 1.25 MG (50000 UNIT) CAPS capsule TAKE 1 CAPSULE (50,000 UNITS TOTAL) BY MOUTH EVERY 7 (SEVEN) DAYS 12  capsule 0   ezetimibe (ZETIA) 10 MG tablet Take 1 tablet (10 mg total) by mouth daily. 90 tablet 3   No current facility-administered medications for this visit.   No Known Allergies   Review of Systems: All systems reviewed and negative except where noted in HPI.    US BIOPSY (LIVER) Result Date: 10/22/2023 INDICATION: Elevated LFT's EXAM: ULTRASOUND GUIDED LIVER BIOPSY, NON TARGETED COMPARISON:  CT AP, 03/02/2022. MEDICATIONS: None ANESTHESIA/SEDATION: Moderate (conscious) sedation was employed during this procedure. A total of Versed 2 mg and Fentanyl 100 mcg was administered intravenously. Moderate Sedation Time: 14 minutes. The patient's level of consciousness and vital signs were monitored continuously by radiology nursing throughout the procedure under my direct supervision. COMPLICATIONS: None immediate. PROCEDURE: Informed written consent was obtained from the patient after a discussion of the risks, benefits and alternatives to treatment. The patient understands and consents the procedure. A timeout was performed prior to the initiation of the procedure. Ultrasound scanning was performed of the right upper abdominal quadrant and the procedure was planned. The right upper abdomen was prepped and draped in the usual sterile fashion. The overlying soft tissues were anesthetized with 1% lidocaine with epinephrine. A 17 gauge, 6.8 cm co-axial needle was advanced into a peripheral aspect of the right lobe of the liver and 3 core biopsies were obtained with an 18 gauge core device under direct ultrasound guidance. The co-axial needle was removed then superficial hemostasis was obtained with manual compression. Post procedural scanning was negative for definitive area of hemorrhage. A dressing was placed. The patient tolerated the procedure well without immediate post procedural complication. IMPRESSION: Successful ultrasound guided non-targeted liver biopsy. Roanna Banning, MD [facility] Electronically Signed   By: Roanna Banning M.D.   On: 10/22/2023 16:48   XR C-ARM NO REPORT Result Date: 10/13/2023 Please see Notes tab for imaging impression.  Epidural Steroid injection Result Date: 10/13/2023 Tyrell Antonio, MD     10/13/2023 11:12 AM Lumbar Epidural Steroid Injection - Interlaminar Approach with Fluoroscopic Guidance Patient: KAAVYA PUSKARICH     Date of Birth: 10/06/1980 MRN: [id number] PCP: Jeani Sow, MD     Visit Date: 10/13/2023  Universal Protocol:   Consent Given By: the patient Position: PRONE Additional Comments: Vital  signs were monitored before and after the procedure. Patient was prepped and draped in the usual sterile fashion. The correct patient, procedure, and site was verified. Injection Procedure Details: Procedure diagnoses: Lumbar radiculopathy [M54.16] Meds Administered: Meds ordered this encounter Medications  methylPREDNISolone acetate (DEPO-MEDROL) injection 40 mg  Laterality: Right Location/Site:  L4-5 Needle: 3.5 in., 20 ga. Tuohy Needle Placement: Paramedian epidural Findings:  -Comments: Excellent flow of contrast into the epidural space. Procedure Details: Using a paramedian approach from the side mentioned above, the region overlying the inferior lamina was localized under fluoroscopic visualization and the soft tissues overlying this structure were infiltrated with 4 ml. of 1% Lidocaine without Epinephrine. The Tuohy needle was inserted into the epidural space using a paramedian approach. The epidural space was localized using loss of resistance along with counter oblique bi-planar fluoroscopic views.  After negative aspirate for air, blood, and CSF, a 2 ml. volume of Isovue-250 was injected into the epidural space and the flow of contrast was observed. Radiographs were obtained for documentation purposes.  The injectate was administered into the level noted above. Additional Comments: No complications  occurred Dressing: 2 x 2 sterile gauze and Band-Aid  Post-procedure details: Patient was observed during the procedure. Post-procedure instructions were reviewed. Patient left the clinic in stable condition.  US Abdomen Limited RUQ (LIVER/GB) Result Date: 10/01/2023 CLINICAL DATA:  Elevated LFTs EXAM: ULTRASOUND ABDOMEN LIMITED RIGHT UPPER QUADRANT COMPARISON:  Ultrasound abdomen 08/11/2023 FINDINGS: Gallbladder: No gallstones or wall thickening visualized. No sonographic Murphy sign noted by sonographer. Common bile duct: Diameter: 3.1 mm Liver: No focal lesion identified. Within normal limits in parenchymal echogenicity. Portal vein is patent on color Doppler imaging with normal direction of blood flow towards the liver. Other: None. IMPRESSION: No cholelithiasis or sonographic evidence for acute cholecystitis. Electronically Signed   By: Annia Belt M.D.   On: 10/01/2023 11:52    Physical Exam: BP 118/78   Pulse 93   Ht 5\' 7"  (1.702 m)   Wt 244 lb (110.7 kg)   LMP 01/29/2023 (Approximate)   BMI 38.22 kg/m  Constitutional: Pleasant,well-developed, Caucasian female in no acute distress. HEENT: Normocephalic and atraumatic. Conjunctivae are normal. No scleral icterus. Neck supple.  Cardiovascular: Normal rate, regular rhythm.  Pulmonary/chest: Effort normal and breath sounds normal. No wheezing, rales or rhonchi. Abdominal: Soft, nondistended, nontender. Bowel sounds active throughout. There are no masses palpable. No hepatomegaly. Extremities: no edema Neurological: Alert and oriented to person place and time. Skin: Skin is warm and dry. No rashes noted. Psychiatric: Normal mood and affect. Behavior is normal.  CBC    Component Value Date/Time   WBC 12.6 (H) 10/22/2023 1129   RBC 4.53 10/22/2023 1129   HGB 13.5 10/22/2023 1129   HGB 12.6 09/30/2023 1111   HGB 12.9 07/16/2022 0939   HCT 41.8 10/22/2023 1129   HCT 37.6 07/16/2022 0939   PLT 340 10/22/2023 1129   PLT 295 09/30/2023  1111   PLT 295 07/16/2022 0939   MCV 92.3 10/22/2023 1129   MCV 102 (H) 07/16/2022 0939   MCH 29.8 10/22/2023 1129   MCHC 32.3 10/22/2023 1129   RDW 13.5 10/22/2023 1129   RDW 14.2 07/16/2022 0939   LYMPHSABS 2.8 10/22/2023 1129   MONOABS 0.7 10/22/2023 1129   EOSABS 0.2 10/22/2023 1129   BASOSABS 0.1 10/22/2023 1129    CMP     Component Value Date/Time   NA 138 10/22/2023 1129   NA 137 07/16/2022 0939   K 4.2 10/22/2023 1129  CL 105 10/22/2023 1129   CO2 20 (L) 10/22/2023 1129   GLUCOSE 114 (H) 10/22/2023 1129   BUN 16 10/22/2023 1129   BUN 12 07/16/2022 0939   CREATININE 0.65 10/22/2023 1129   CREATININE 0.69 09/30/2023 1111   CREATININE 0.53 08/18/2023 1644   CALCIUM 9.4 10/22/2023 1129   PROT 8.4 (H) 10/22/2023 1129   PROT 6.3 11/20/2022 1152   ALBUMIN 4.0 10/22/2023 1129   ALBUMIN 4.3 11/20/2022 1152   AST 314 (H) 10/22/2023 1129   AST 336 (HH) 09/30/2023 1111   ALT 639 (H) 10/22/2023 1129   ALT 659 (HH) 09/30/2023 1111   ALKPHOS 59 10/22/2023 1129   BILITOT 1.3 (H) 10/22/2023 1129   BILITOT 0.9 09/30/2023 1111   GFRNONAA >60 10/22/2023 1129   GFRNONAA >60 09/30/2023 1111       Latest Ref Rng & Units 10/22/2023   11:29 AM 10/03/2023    9:28 AM 09/30/2023   11:11 AM  CBC EXTENDED  WBC 4.0 - 10.5 K/uL 12.6  8.7  11.8   RBC 3.87 - 5.11 MIL/uL 4.53  4.10  4.00   Hemoglobin 12.0 - 15.0 g/dL 60.4  54.0  98.1   HCT 36.0 - 46.0 % 41.8  39.4  38.6   Platelets 150 - 400 K/uL 340  298.0  295   NEUT# 1.7 - 7.7 K/uL 8.6  6.5  8.5   Lymph# 0.7 - 4.0 K/uL 2.8  1.5  2.3       ASSESSMENT AND PLAN:  43 year old female with known alcohol-induced liver disease, with recent liver biopsy demonstrating steatohepatitis with bridging fibrosis.  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  Liver disease with bridging fibrosis Liver biopsy indicates bridging fibrosis, suggesting advanced scarring and potential progression to definitive cirrhosis. She maintains she has  abstained from alcohol for over a year, although a phosphatidyl ethanol level in January was significantly elevated.  Although alcohol is by far the most likely cause of her liver disease, metabolic liver disease may also be contributing but is not the primary factor. Complete alcohol avoidance is crucial for liver recovery.  In addition, she should make some efforts to improve her diet and exercise habits, mainly eliminating/reducing sugar based foods, and increasing consumption of natural foods/fruits/vegetables. -Repeat phosphatidyl ethanol level today - Avoid alcohol completely - Avoid NSAIDs - Limit acetaminophen to 2000 mg per day - Monitor liver enzymes closely - Consider holding ezetimibe if liver enzymes do not improve - Consider discontinuing Wellbutrin if liver enzymes remain elevated  Acute liver enzyme elevation Patient's liver enzymes currently with ALT 500s-600s, AST 200s-300s.  This acute liver enzyme elevation is not consistent with typical alcohol induced exacerbation.  I am mildly suspicious that this acute exacerbation is medication related.  It is possible that the Tylenol and the NSAIDs may be driving these elevated liver enzymes, but the patient was also recently restarted on Wellbutrin, which also carries some risk of hepatotoxicity.  We will need to closely follow her liver enzymes while they are still significantly elevated.  She has stopped all NSAIDs, and she is reducing her Tylenol use to 2 g or less per day.  We will repeat liver enzymes in 1 month.  If there has been no significant improvement, I would recommend we stop her Wellbutrin. -Suspect drug-induced liver injury -Stop NSAIDs -Limit acetaminophen to 2 g or less per day -Recheck liver enzymes 1 month -Stop Wellbutrin if no improvement in liver enzymes in 1 month  Metabolic considerations She is  concerned about weight gain affecting liver health. Dietary modifications, including reducing sugar and carbohydrate  intake, are recommended to address potential metabolic syndrome contributing to liver disease. Eliminating sugary foods and drinks, including sodas, and reducing carbohydrates like pasta and bread are advised. Black coffee consumption is encouraged for its potential anti-inflammatory liver benefits. - Eliminate sugary foods and drinks, including sodas - Reduce intake of carbohydrates like pasta and bread - Encourage consumption of black coffee  Cyclic vomiting syndrome (CVS) CVS flare-ups have improved in duration, now lasting about two days but occurring more frequently every two to two and a half weeks. She is on a low dose of amitriptyline at night, which may be contributing to improvement. CVS diagnosis is by exclusion, and a positive alcohol test raises questions about the role of alcohol may be playing in her symptoms, and the validity of her symptom reporting.  Although I had mentioned filling out paperwork supportive of a disability claim for cyclic vomiting syndrome, I deferred this because of the positive phosphatidyl ethanol level.  I am not aware of any potential false positives for this test, but the patient is very insistent she has not consumed any alcohol.  Her recent improvement in her macrocytosis is suggestive that her alcohol use has improved, but this only improved since December.  I told the patient that I would repeat a phosphatidyl ethanol level, and reconsider filling out disability paperwork if it were negative.  In the meantime, we can increase her amitriptyline to 2 tablets at night.  I told the patient that I would do some research to see if there are other reports of false positive phosphatidyl ethanol levels. - Increase amitriptyline to two tablets at night - Investigate potential causes of false positive alcohol test  Medication management for pain She uses acetaminophen and ibuprofen for back pain, but due to liver concerns, acetaminophen should be limited and NSAIDs  avoided completely. Tramadol is prescribed for pain management, and consultation with a pain center is advised. Tramadol is a safer alternative as it is not associated with hepatotoxicity, but can cause nausea/vomiting. - Avoid NSAIDs - Limit acetaminophen to 2000 mg per day - Continue tramadol for pain management - Consult a pain center for further pain management      Kristene Liberati E. Tomasa Rand, MD [facility]  I spent a total of 40 minutes reviewing the patient's medical record, interviewing and examining the patient, discussing her diagnosis and management of her condition going forward, and documenting in the medical record   Jeani Sow, MD

## 2023-10-30 NOTE — Patient Instructions (Addendum)
 Your provider has requested that you go to the basement level for lab work before leaving today. Press "B" on the elevator. The lab is located at the first door on the left as you exit the elevator.  _______________________________________________________  If your blood pressure at your visit was 140/90 or greater, please contact your primary care physician to follow up on this.  _______________________________________________________  If you are age 43 or older, your body mass index should be between 23-30. Your Body mass index is 38.22 kg/m. If this is out of the aforementioned range listed, please consider follow up with your Primary Care Provider.  If you are age 47 or younger, your body mass index should be between 19-25. Your Body mass index is 38.22 kg/m. If this is out of the aformentioned range listed, please consider follow up with your Primary Care Provider.   ________________________________________________________  The [facility] providers would like to encourage you to use [facility] to communicate with providers for non-urgent requests or questions.  Due to long hold times on the telephone, sending your provider a message by [facility] may be a faster and more efficient way to get a response.  Please allow 48 business hours for a response.  Please remember that this is for non-urgent requests.  _______________________________________________________    It was a pleasure to see you today!  Thank you for trusting me with your gastrointestinal care!

## 2023-10-31 DIAGNOSIS — M129 Arthropathy, unspecified: Secondary | ICD-10-CM | POA: Diagnosis not present

## 2023-10-31 DIAGNOSIS — S22000A Wedge compression fracture of unspecified thoracic vertebra, initial encounter for closed fracture: Secondary | ICD-10-CM | POA: Diagnosis not present

## 2023-10-31 DIAGNOSIS — Z79899 Other long term (current) drug therapy: Secondary | ICD-10-CM | POA: Diagnosis not present

## 2023-11-03 ENCOUNTER — Encounter: Payer: Self-pay | Admitting: Family Medicine

## 2023-11-03 ENCOUNTER — Ambulatory Visit (INDEPENDENT_AMBULATORY_CARE_PROVIDER_SITE_OTHER): Admitting: Family Medicine

## 2023-11-03 VITALS — BP 130/84 | HR 96 | Temp 97.5°F | Resp 18 | Ht 67.0 in | Wt 243.5 lb

## 2023-11-03 DIAGNOSIS — G43009 Migraine without aura, not intractable, without status migrainosus: Secondary | ICD-10-CM

## 2023-11-03 DIAGNOSIS — E876 Hypokalemia: Secondary | ICD-10-CM

## 2023-11-03 DIAGNOSIS — R7989 Other specified abnormal findings of blood chemistry: Secondary | ICD-10-CM | POA: Diagnosis not present

## 2023-11-03 DIAGNOSIS — F4323 Adjustment disorder with mixed anxiety and depressed mood: Secondary | ICD-10-CM | POA: Diagnosis not present

## 2023-11-03 DIAGNOSIS — R600 Localized edema: Secondary | ICD-10-CM | POA: Diagnosis not present

## 2023-11-03 DIAGNOSIS — G43709 Chronic migraine without aura, not intractable, without status migrainosus: Secondary | ICD-10-CM | POA: Diagnosis not present

## 2023-11-03 DIAGNOSIS — E538 Deficiency of other specified B group vitamins: Secondary | ICD-10-CM

## 2023-11-03 DIAGNOSIS — R569 Unspecified convulsions: Secondary | ICD-10-CM | POA: Diagnosis not present

## 2023-11-03 MED ORDER — NURTEC 75 MG PO TBDP
75.0000 mg | ORAL_TABLET | ORAL | 3 refills | Status: DC
Start: 2023-11-03 — End: 2023-11-11

## 2023-11-03 MED ORDER — CYANOCOBALAMIN 1000 MCG/ML IJ SOLN
1000.0000 ug | Freq: Once | INTRAMUSCULAR | Status: AC
  Administered 2023-11-03: 1000 ug via INTRAMUSCULAR

## 2023-11-03 NOTE — Progress Notes (Signed)
 Subjective:     Patient ID: Janice Arnold, female    DOB: 11-May-1981, 43 y.o.   MRN: [id number]  Chief Complaint  Patient presents with   Medical Management of Chronic Issues    4 week follow-up Starting to get frequent headaches, almost like migraines Discuss medications, wellbutrin and tramadol for pain    HPI-Dr. Billy Coast Discussed the use of AI scribe software for clinical note transcription with the patient, who gave verbal consent to proceed.  History of Present Illness Janice Arnold is a 43 year old female with elevated liver enzymes and cyclical vomiting who presents for follow-up on liver function and medication management.  She has a history of elevated liver enzymes, confirmed to be due to fatty liver following a liver biopsy. She is focused on diet, exercise, and weight loss to manage this condition. She is concerned about the impact of her medications, particularly Wellbutrin and Zetia, on her liver enzyme levels. Wellbutrin was previously stopped due to seizure concerns but was resumed after neurologist approval(but not on pt med list as using leftover). She is monitoring her liver function closely w/repeat labs in 1 mo w/GI.  She has cyclical vomiting syndrome and her amitriptyline dosage was recently increased to 20 mg at night. There is concern about elevated phosphatidylethanol levels, which could indicate alcohol consumption, but she denies drinking alcohol. She recalls a possible instance of consuming champagne around [place] but is uncertain. She is exploring other potential causes, including medication effects and unintentional exposure to alcohol.  She experiences frequent headaches, described as tension headaches located in the temple area and behind the eyes, occurring almost every other day for the past month. These headaches are associated with blurred vision and nausea, but no vomiting. She is currently taking magnesium 400 mg daily and is exploring other  non-NSAID options for headache relief due to liver concerns.  Her medication regimen includes birth control pills, specifically norgestimate and norethindrone, which she takes daily and twice daily, respectively, to manage bleeding-was seeing Dr. Billy Coast but ins not cover him now.  Is supposed to f/u w/gyn at [facility] for hyst but insurance issues so needs to f/u.  She is also on spironolactone/hydrochlorothiazide for edema and potassium supplementation.  Anemia-seeing heme.  Partly from vag bleeding-stable for now.  Also, B12 deficiency-on monthly injections  Chronic pain-seeing pain mgmt.  Tramadol helps.  They rec oxycontin but pt gets rash when on long term.     Health Maintenance Due  Topic Date Due   Cervical Cancer Screening (HPV/Pap Cotest)  Never done   MAMMOGRAM  04/29/2020    Past Medical History:  Diagnosis Date   Alcohol abuse    history of heavy alcohol abuse, last drank alcohol in 06/2022   Anemia 2023   s/p iron infusions   Anxiety    Follows with PCP. Dr. Dewitt Rota.   Compression fracture of body of thoracic vertebra (HCC) 11/05/2022   T4, T5, T6 / Follows w/ orthopedic, Dr. Willia Craze. Fractures occurred while patient was having a seizure, per patient.   Cyclic vomiting syndrome    hospital admission on 05/28/22 for vomiting, follows with gastroenterologist Dr. Tiajuana Amass.   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 Tiajuana Amass.   GERD (gastroesophageal reflux disease)    takes omeprazole daily   Heart murmur  since childhood   Hyperlipidemia    11/20/22 lipid panel in Epic   Hypokalemia 10/15/2022   K+ level 2.5   Menorrhagia    Palpitations    Follows w/ cardiology, Anice Paganini, NP. 11/14/2022 echocardiogram EF 60 65%, 12/31/22 Event heart monitor results in Epic.   PONV (postoperative nausea and vomiting)    Prolonged QT  interval    Follows w/ cardiology, Bernadene Person, NP.   Seizure (HCC) 06/06/2022   Follows with neurologist, Dr. Leim Fabry @ [facility]. Seizures were thought to be related to stopping alcohol. However, patient has had seizures months after quitting alcohol. As of 02/06/23, last seizure was on 11/07/22. Keppra was then increased to bid.   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 See 11/05/22 Head CT and MRI in Epic. Resolved per pt on 02/06/23.   Tremors of nervous system    whole body tremors, follows with Dr. Leim Fabry @ [facility].   Vitamin deficiency 2023   B12 deficiency, receiving monthly B12 injections / Vitamin D deficiency, taking supplement every two weeks   Wears contact lenses    Wears glasses     Past Surgical History:  Procedure Laterality Date   COLONOSCOPY WITH ESOPHAGOGASTRODUODENOSCOPY (EGD)  03/27/2022   one rectal polyp   LAPAROSCOPIC APPENDECTOMY N/A 03/02/2022   Procedure: APPENDECTOMY LAPAROSCOPIC;  Surgeon: Manus Rudd, MD;  Location: [facility];  Service: General;  Laterality: N/A;   ROBOTIC ASSISTED LAPAROSCOPIC HYSTERECTOMY AND SALPINGECTOMY Bilateral 03/04/2023   Procedure: ABORTED XI ROBOTIC ASSISTED LAPAROSCOPIC HYSTERECTOMY AND SALPINGECTOMY;  Surgeon: Olivia Mackie, MD;  Location: [facility];  Service: Gynecology;  Laterality: Bilateral;  Requests 2/1/2 hrs.     Current Outpatient Medications:    amitriptyline (ELAVIL) 10 MG tablet, Take 2 tablets (20 mg total) by mouth at bedtime., Disp: 180 tablet, Rfl: 1   busPIRone (BUSPAR) 5 MG tablet, TAKE 1 TABLET BY MOUTH TWICE A DAY, Disp: 180 tablet, Rfl: 0   DULoxetine (CYMBALTA) 60 MG capsule, Take 1 capsule (60 mg total) by mouth daily., Disp: 90 capsule, Rfl: 1   ezetimibe (ZETIA) 10 MG tablet, Take 1 tablet (10 mg total) by mouth daily., Disp: 90 tablet, Rfl: 3   hydrochlorothiazide (HYDRODIURIL) 25  MG tablet, Take 1 tablet (25 mg total) by mouth daily as needed., Disp: 90 tablet, Rfl: 0   hydrOXYzine (VISTARIL) 25 MG capsule, Take 1 capsule (25 mg total) by mouth every 6 (six) hours as needed., Disp: 360 capsule, Rfl: 1   KLOR-CON M20 20 MEQ tablet, TAKE 2 TABLETS (40 MEQ TOTAL) BY MOUTH 3 (THREE) TIMES DAILY., Disp: 540 tablet, Rfl: 1   levETIRAcetam (KEPPRA) 750 MG tablet, Take 750 mg by mouth 2 (two) times daily. 1 tablet twice a day, Disp: , Rfl:    lidocaine (LIDODERM) 5 %, PLACE 1 PATCH ONTO THE SKIN DAILY. REMOVE & DISCARD PATCH WITHIN 12 HOURS OR AS DIRECTED BY MD, Disp: 30 patch, Rfl: 0   melatonin 5 MG TABS, Take 5 mg by mouth. 25 mg total, Disp: , Rfl:    methocarbamol (ROBAXIN) 500 MG tablet, TAKE 1 TABLET (500 MG TOTAL) BY MOUTH EVERY 8 (EIGHT) HOURS AS NEEDED (PAIN MUSCLE SPASMS)., Disp: 120 tablet, Rfl: 1   metoCLOPramide (REGLAN) 10 MG tablet, Take 1 tablet (10 mg total) by mouth every 8 (eight) hours as needed for nausea., Disp: 30 tablet, Rfl: 0   metoprolol succinate (TOPROL-XL)  25 MG 24 hr tablet, TAKE 1 TABLET (25 MG TOTAL) BY MOUTH DAILY., Disp: 90 tablet, Rfl: 3   norethindrone (AYGESTIN) 5 MG tablet, Take by mouth daily., Disp: , Rfl:    norgestimate-ethinyl estradiol (ORTHO-CYCLEN) 0.25-35 MG-MCG tablet, Take 1 tablet by mouth daily., Disp: , Rfl:    omeprazole (PRILOSEC) 40 MG capsule, Take 1 capsule (40 mg total) by mouth daily., Disp: 90 capsule, Rfl: 1   ondansetron (ZOFRAN-ODT) 4 MG disintegrating tablet, TAKE 1 TABLET BY MOUTH EVERY 8 HOURS AS NEEDED FOR NAUSEA AND VOMITING, Disp: 20 tablet, Rfl: 1   promethazine (PHENERGAN) 25 MG suppository, Place 1 suppository (25 mg total) rectally every 6 (six) hours as needed for nausea or vomiting., Disp: 12 each, Rfl: 3   promethazine (PHENERGAN) 25 MG tablet, TAKE 1 TABLET (25 MG TOTAL) BY MOUTH DAILY., Disp: 90 tablet, Rfl: 0   Rimegepant Sulfate (NURTEC) 75 MG TBDP, Take 1 tablet (75 mg total) by mouth every other day.,  Disp: 16 tablet, Rfl: 3   spironolactone (ALDACTONE) 25 MG tablet, Take 1 tablet (25 mg total) by mouth daily., Disp: 90 tablet, Rfl: 3   traMADol (ULTRAM) 50 MG tablet, Take 50 mg by mouth every 6 (six) hours as needed., Disp: , Rfl:    triamcinolone cream (KENALOG) 0.1 %, Apply 1 Application topically 2 (two) times daily. (Patient taking differently: Apply 1 Application topically 2 (two) times daily. As needed), Disp: 30 g, Rfl: 0   Vitamin D, Ergocalciferol, (DRISDOL) 1.25 MG (50000 UNIT) CAPS capsule, TAKE 1 CAPSULE (50,000 UNITS TOTAL) BY MOUTH EVERY 7 (SEVEN) DAYS, Disp: 12 capsule, Rfl: 0  No Known Allergies ROS neg/noncontributory except as noted HPI/below      Objective:     BP 130/84   Pulse 96   Temp (!) 97.5 F (36.4 C) (Temporal)   Resp 18   Ht 5\' 7"  (1.702 m)   Wt 243 lb 8 oz (110.5 kg)   LMP 01/29/2023 (Approximate)   SpO2 96%   BMI 38.14 kg/m  Wt Readings from Last 3 Encounters:  11/03/23 243 lb 8 oz (110.5 kg)  10/30/23 244 lb (110.7 kg)  10/22/23 229 lb (103.9 kg)    Physical Exam   Gen: WDWN NAD HEENT: NCAT, conjunctiva not injected, sclera nonicteric NECK:  supple, no thyromegaly, no nodes, no carotid bruits CARDIAC: RRR, S1S2+, no murmur. DP 2+B LUNGS: CTAB. No wheezes ABDOMEN:  BS+, soft, NTND, No HSM, no masses EXT:  no edema MSK: no gross abnormalities.  NEURO: A&O x3.  CN II-XII intact.  PSYCH: normal mood. Good eye contact     Assessment & Plan:  B12 deficiency -     Cyanocobalamin  Migraine without aura and without status migrainosus, not intractable -     Nurtec; Take 1 tablet (75 mg total) by mouth every other day.  Dispense: 16 tablet; Refill: 3  Chronic migraine w/o aura w/o status migrainosus, not intractable -     Nurtec; Take 1 tablet (75 mg total) by mouth every other day.  Dispense: 16 tablet; Refill: 3  Abnormal LFTs (liver function tests)  Adjustment disorder with mixed anxiety and depressed  mood  Hypokalemia  Hypomagnesemia  Local edema  Seizure (HCC)  Assessment and Plan Assessment & Plan Fatty Liver Disease   She has fatty liver disease confirmed by biopsy, with worsening liver function tests. Elevated phosphatidylethanol levels suggest possible alcohol consumption, though she denies significant intake. Medication-induced liver enzyme elevation from Wellbutrin and Zetia is  considered. Non-alcoholic causes for elevated phosphatidylethanol, including medication interactions and unintentional alcohol exposure, were discussed. Hold Wellbutrin and Zetia. Repeat liver enzymes in one month. Encourage diet and exercise for weight loss. Investigate potential non-alcoholic causes for elevated phosphatidylethanol.  Cyclical Vomiting Syndrome   Her cyclical vomiting syndrome is managed with amitriptyline, increased to 20 mg nightly. There are concerns about the medication's impact on liver function tests. Discussed potential exacerbation of symptoms with weight management medications like Wegovy. Continue amitriptyline 20 mg nightly. Avoid weight management medications that may exacerbate vomiting.  Headaches   She experiences increased frequency of headaches every other day, with blurred vision and nausea but no vomiting. They are tension-like and not quite migraines. Advised to avoid acetaminophen due to liver concerns. Discussed alternative headache management strategies, including magnesium, riboflavin, and caffeine. Continue magnesium 400 mg daily. Start riboflavin twice daily. Avoid acetaminophen. Consider caffeine for acute headache relief. Discuss headache management with a neurologist. I am concerned w/meds, seratonin syndrome w/tramadol, duloxetime if we did triptan.  Will try to get nurtec chronic mgmt apptoved.   Anxiety   She experiences anxiety, potentially exacerbated by Wellbutrin. Hydroxyzine is available for management. Discussed potential impact of medication changes on  anxiety levels. Use hydroxyzine as needed for anxiety. Monitor anxiety levels after discontinuing Wellbutrin.  Medication Management   She is on multiple medications, including birth control, spironolactone, and potassium. There is a need to review and adjust medications to avoid potential interactions and side effects. Discussed unusual birth control regimen and need for specialist input. Discuss birth control regimen with the prescribing physician. Ensure coordination with a neurologist regarding medication adjustments. Review potential interactions with current medications.  Weight Management   She has gained weight and is interested in exploring weight management options. There is concern about using medications like Wegovy due to potential exacerbation of cyclical vomiting syndrome. Discussed need for further evaluation and documentation for Medicaid coverage. Schedule a video visit to discuss weight management options. Evaluate potential impact of weight management medications on cyclical vomiting.    Return in about 4 weeks (around 12/01/2023) for asap video visit.-wt    f/u 1 mo, chronic follow-up.  Angelena Sole, MD

## 2023-11-03 NOTE — Patient Instructions (Addendum)
 Hold wellbutrin, hold the zetia  Magnesium daily 400mg   try twice daily.   Caffeine.

## 2023-11-04 ENCOUNTER — Telehealth: Payer: Self-pay | Admitting: *Deleted

## 2023-11-04 ENCOUNTER — Other Ambulatory Visit (HOSPITAL_COMMUNITY): Payer: Self-pay

## 2023-11-04 ENCOUNTER — Encounter: Payer: Self-pay | Admitting: Family

## 2023-11-04 NOTE — Telephone Encounter (Signed)
 Copied from CRM [id number]. Topic: Clinical - Prescription Issue >> Nov 04, 2023  1:30 PM Gurney Maxin H wrote: Reason for CRM: Patient called in regards to refill request submitted for the Rimegepant Sulfate (NURTEC) 75 MG TBDP, states insurance needs a prior authorization for medication.   Harlene [phone]

## 2023-11-05 ENCOUNTER — Telehealth: Payer: Self-pay

## 2023-11-05 ENCOUNTER — Encounter: Payer: Self-pay | Admitting: Family

## 2023-11-05 ENCOUNTER — Other Ambulatory Visit (HOSPITAL_COMMUNITY): Payer: Self-pay

## 2023-11-05 ENCOUNTER — Telehealth: Payer: Self-pay | Admitting: *Deleted

## 2023-11-05 LAB — PHOSPHATIDYLETHANOL (PETH)
Phosphatidylethanol (PEth): NEGATIVE ng/mL
Phosphatidylethanol: NEGATIVE

## 2023-11-05 NOTE — Telephone Encounter (Signed)
 Copied from CRM [id number]. Topic: General - Inquiry >> Nov 05, 2023  8:26 AM Lennart Pall wrote: Reason for CRM: Patient calling to get status on prior authorization for Rimegepant Sulfate (NURTEC) 75 MG TBDP  Patient informed that PA team is working on it, will keep her updated. Patient given a sample due to very bad migraine.

## 2023-11-05 NOTE — Telephone Encounter (Signed)
 Noted.

## 2023-11-05 NOTE — Telephone Encounter (Signed)
 Pharmacy Patient Advocate Encounter   Received notification from Pt Calls Messages that prior authorization for Nurtec 75mg  is required/requested.   Insurance verification completed.   The patient is insured through [facility] .   Per test claim: PA required; PA submitted to above mentioned insurance via CoverMyMeds Key/confirmation #/EOC [id number] Status is pending

## 2023-11-06 ENCOUNTER — Encounter: Payer: Self-pay | Admitting: Family Medicine

## 2023-11-06 ENCOUNTER — Telehealth: Admitting: Family Medicine

## 2023-11-06 ENCOUNTER — Telehealth: Payer: Self-pay | Admitting: *Deleted

## 2023-11-06 ENCOUNTER — Other Ambulatory Visit (HOSPITAL_COMMUNITY): Payer: Self-pay

## 2023-11-06 DIAGNOSIS — E66812 Obesity, class 2: Secondary | ICD-10-CM | POA: Diagnosis not present

## 2023-11-06 DIAGNOSIS — K760 Fatty (change of) liver, not elsewhere classified: Secondary | ICD-10-CM

## 2023-11-06 DIAGNOSIS — Z6838 Body mass index (BMI) 38.0-38.9, adult: Secondary | ICD-10-CM

## 2023-11-06 MED ORDER — WEGOVY 0.25 MG/0.5ML ~~LOC~~ SOAJ: 0.2500 mg | SUBCUTANEOUS | 0 refills | Status: DC

## 2023-11-06 NOTE — Telephone Encounter (Signed)
 Copied from CRM [id number]. Topic: Clinical - Prescription Issue >> Nov 06, 2023  8:35 AM Sim Boast F wrote: Reason for CRM: Patient request call back regarding Nurtec being denied, said there's a list of other alternatives medications and she would like one sent to pharmacy

## 2023-11-06 NOTE — Telephone Encounter (Signed)
 Pharmacy Patient Advocate Encounter  Received notification from [facility] Medicaid that Prior Authorization for Nurtec 75MG  dispersible tablets has been DENIED.  Full denial letter will be uploaded to the media tab. See denial reason below.     PA #/Case ID/Reference #: [id number]

## 2023-11-06 NOTE — Progress Notes (Signed)
 MyChart Video Visit Virtual Visit via Video Note   This visit type was conducted w/patient consent. This format is felt to be most appropriate for this patient at this time. Physical exam was limited by quality of the video and audio technology used for the visit. CMA was able to get the patient set up on a video visit.  Patient location: Home. Patient and provider in visit Provider location: Office  I discussed the limitations of evaluation and management by telemedicine and the availability of in person appointments. The patient expressed understanding and agreed to proceed.  Visit Date: 11/06/2023  Today's healthcare provider: Angelena Sole, MD     Subjective:    Patient ID: Janice Arnold, female    DOB: [birth year], 43 y.o.   MRN: [id number]  Chief Complaint  Patient presents with   Weight Loss    Discuss weight loss options   Medication Problem    Discuss Nurtec denial, discuss other options    HPI WW in past.  Started 375 in Oct 2019 HA-2-3x/wk   no h/o kidney stones. Still waiting on PA.  Discussed the use of AI scribe software for clinical note transcription with the patient, who gave verbal consent to proceed.  History of Present Illness Janice Arnold is a 43 year old female who presents with weight management concerns.  She has experienced significant weight loss since October 2019, reducing from approximately 375 pounds to 175 pounds by early 2024. Recently, she has noticed weight gain, which she attributes to health changes and limitations in her ability to exercise due to back issues. She is concerned about managing her weight effectively.  She exercises once or twice a week outside of regular activities and is working on increasing her walking frequency. Her diet consists of lower carb, lower fat meals with smaller portions. She drinks plenty of water and does not consume alcohol.  She has previously tried Weight Watchers and phentermine for weight loss, with  limited success. She has not used other weight loss medications aside from occasional Slimfast shakes. She is open to weekly injections for weight management. She has metabolic liver dz w/fibrosis  She experiences headaches two to three times a week and has been taking magnesium supplements. She has not tried Maxalt or Imitrex for headache management. She avoids using Advil and Tylenol due to previous advice and is exploring preventative options. No thoughts of suicide.     Past Medical History:  Diagnosis Date   Alcohol abuse    history of heavy alcohol abuse, last drank alcohol in 06/2022   Anemia 2023   s/p iron infusions   Anxiety    Follows with PCP. Dr. Dewitt Rota.   Compression fracture of body of thoracic vertebra (HCC) 11/05/2022   T4, T5, T6 / Follows w/ orthopedic, Dr. Willia Craze. Fractures occurred while patient was having a seizure, per patient.   Cyclic vomiting syndrome    hospital admission on 05/28/22 for vomiting, follows with gastroenterologist Dr. Tiajuana Amass.   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 Tiajuana Amass.   GERD (gastroesophageal reflux disease)    takes omeprazole daily   Heart murmur    since childhood   Hyperlipidemia    11/20/22 lipid panel in Epic   Hypokalemia 10/15/2022   K+ level 2.5   Menorrhagia    Palpitations  Follows w/ cardiology, Anice Paganini, NP. 11/14/2022 echocardiogram EF 60 65%, 12/31/22 Event heart monitor results in Epic.   PONV (postoperative nausea and vomiting)    Prolonged QT interval    Follows w/ cardiology, Bernadene Person, NP.   Seizure (HCC) 06/06/2022   Follows with neurologist, Dr. Leim Fabry @ [facility]. Seizures were thought to be related to stopping alcohol. However, patient has had seizures months after quitting alcohol. As of 02/06/23, last seizure was on 11/07/22.  Keppra was then increased to bid.   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 See 11/05/22 Head CT and MRI in Epic. Resolved per pt on 02/06/23.   Tremors of nervous system    whole body tremors, follows with Dr. Leim Fabry @ [facility].   Vitamin deficiency 2023   B12 deficiency, receiving monthly B12 injections / Vitamin D deficiency, taking supplement every two weeks   Wears contact lenses    Wears glasses     Past Surgical History:  Procedure Laterality Date   COLONOSCOPY WITH ESOPHAGOGASTRODUODENOSCOPY (EGD)  03/27/2022   one rectal polyp   LAPAROSCOPIC APPENDECTOMY N/A 03/02/2022   Procedure: APPENDECTOMY LAPAROSCOPIC;  Surgeon: Manus Rudd, MD;  Location: [facility];  Service: General;  Laterality: N/A;   ROBOTIC ASSISTED LAPAROSCOPIC HYSTERECTOMY AND SALPINGECTOMY Bilateral 03/04/2023   Procedure: ABORTED XI ROBOTIC ASSISTED LAPAROSCOPIC HYSTERECTOMY AND SALPINGECTOMY;  Surgeon: Olivia Mackie, MD;  Location: [facility];  Service: Gynecology;  Laterality: Bilateral;  Requests 2/1/2 hrs.    Outpatient Medications Prior to Visit  Medication Sig Dispense Refill   amitriptyline (ELAVIL) 10 MG tablet Take 2 tablets (20 mg total) by mouth at bedtime. 180 tablet 1   busPIRone (BUSPAR) 5 MG tablet TAKE 1 TABLET BY MOUTH TWICE A DAY 180 tablet 0   cyclobenzaprine (FLEXERIL) 10 MG tablet Take 10 mg by mouth 3 (three) times daily.     DULoxetine (CYMBALTA) 60 MG capsule Take 1 capsule (60 mg total) by mouth daily. 90 capsule 1   hydrochlorothiazide (HYDRODIURIL) 25 MG tablet Take 1 tablet (25 mg total) by mouth daily as needed. 90 tablet 0   hydrOXYzine (VISTARIL) 25 MG capsule Take 1 capsule (25 mg total) by mouth every 6 (six) hours as needed. 360 capsule 1   KLOR-CON M20 20 MEQ tablet TAKE 2 TABLETS (40 MEQ TOTAL) BY MOUTH 3 (THREE) TIMES DAILY. 540 tablet 1   levETIRAcetam (KEPPRA) 750 MG  tablet Take 750 mg by mouth 2 (two) times daily. 1 tablet twice a day     lidocaine (LIDODERM) 5 % PLACE 1 PATCH ONTO THE SKIN DAILY. REMOVE & DISCARD PATCH WITHIN 12 HOURS OR AS DIRECTED BY MD 30 patch 0   melatonin 5 MG TABS Take 5 mg by mouth. 25 mg total     metoCLOPramide (REGLAN) 10 MG tablet Take 1 tablet (10 mg total) by mouth every 8 (eight) hours as needed for nausea. 30 tablet 0   metoprolol succinate (TOPROL-XL) 25 MG 24 hr tablet TAKE 1 TABLET (25 MG TOTAL) BY MOUTH DAILY. 90 tablet 3   naloxone (NARCAN) nasal spray 4 mg/0.1 mL SMARTSIG:Both Nares     norethindrone (AYGESTIN) 5 MG tablet Take by mouth daily.     norgestimate-ethinyl estradiol (ORTHO-CYCLEN) 0.25-35 MG-MCG tablet Take 1 tablet by mouth daily.     omeprazole (PRILOSEC) 40 MG capsule Take 1 capsule (40 mg total) by mouth daily. 90 capsule 1  ondansetron (ZOFRAN-ODT) 4 MG disintegrating tablet TAKE 1 TABLET BY MOUTH EVERY 8 HOURS AS NEEDED FOR NAUSEA AND VOMITING 20 tablet 1   promethazine (PHENERGAN) 25 MG suppository Place 1 suppository (25 mg total) rectally every 6 (six) hours as needed for nausea or vomiting. 12 each 3   promethazine (PHENERGAN) 25 MG tablet TAKE 1 TABLET (25 MG TOTAL) BY MOUTH DAILY. 90 tablet 0   Rimegepant Sulfate (NURTEC) 75 MG TBDP Take 1 tablet (75 mg total) by mouth every other day. 16 tablet 3   spironolactone (ALDACTONE) 25 MG tablet Take 1 tablet (25 mg total) by mouth daily. 90 tablet 3   traMADol (ULTRAM) 50 MG tablet Take 50 mg by mouth every 6 (six) hours as needed.     triamcinolone cream (KENALOG) 0.1 % Apply 1 Application topically 2 (two) times daily. (Patient taking differently: Apply 1 Application topically 2 (two) times daily. As needed) 30 g 0   Vitamin D, Ergocalciferol, (DRISDOL) 1.25 MG (50000 UNIT) CAPS capsule TAKE 1 CAPSULE (50,000 UNITS TOTAL) BY MOUTH EVERY 7 (SEVEN) DAYS 12 capsule 0   ezetimibe (ZETIA) 10 MG tablet Take 1 tablet (10 mg total) by mouth daily. 90 tablet 3    methocarbamol (ROBAXIN) 500 MG tablet TAKE 1 TABLET (500 MG TOTAL) BY MOUTH EVERY 8 (EIGHT) HOURS AS NEEDED (PAIN MUSCLE SPASMS). 120 tablet 1   No facility-administered medications prior to visit.    No Known Allergies      Objective:     Physical Exam  Vitals and nursing note reviewed.  Constitutional:      General:  is not in acute distress.    Appearance: Normal appearance.  HENT:     Head: Normocephalic.  Pulmonary:     Effort: No respiratory distress.  Skin:    General: Skin is dry.     Coloration: Skin is not pale.  Neurological:     Mental Status: Pt is alert and oriented to person, place, and time.  Psychiatric:        Mood and Affect: Mood normal.   LMP 01/29/2023 (Approximate)   Wt Readings from Last 3 Encounters:  11/03/23 243 lb 8 oz (110.5 kg)  10/30/23 244 lb (110.7 kg)  10/22/23 229 lb (103.9 kg)       Assessment & Plan:   Problem List Items Addressed This Visit     Metabolic dysfunction-associated steatotic liver disease (MASLD)   Other Visit Diagnoses       Class 2 severe obesity due to excess calories with serious comorbidity and body mass index (BMI) of 38.0 to 38.9 in adult [facility]    -  Primary     Assessment and Plan Assessment & Plan Obesity   Her weight increased after a significant loss from 375 lbs in October 2019 to 175 lbs last year. Diet and exercise are inconsistent due to back issues. Previous attempts with Weight Watchers and phentermine were not very successful. She is open to new weight loss medications and willing to self-administer weekly injections. Discussed Wegovy and Zepbound, with Medicaid generally preferring [phone]. Side effects include nausea, vomiting, heartburn, diarrhea, and constipation. Emphasized the importance of diet and exercise in conjunction with medication, prioritizing protein and vegetables, avoiding high sugar and carb intake, and ensuring adequate nutrition. Submit prior authorization for [facility]. Advise  increasing walking to five days a week, aiming for 30 minutes per session. Recommend stool softeners like Colace and possibly Miralax daily to prevent constipation. Emphasize a diet prioritizing protein  and vegetables, avoiding high sugar and carb intake.  It is imperative that she loses wt d/t MASLD to prevent further progression of cirrhosis  Headaches   She experiences headaches two to three times a week and takes magnesium as a preventative measure. There is an ongoing issue with insurance coverage for Nurtec, which has been effective. Prior authorization for Nurtec is pending. Discussed aiming for every other day use for prevention, but insurance may require as-needed use. Continue taking magnesium as a preventative measure. Follow up on the prior authorization status for Nurtec. Consider using Nurtec as needed, aiming for every other day use for prevention.  General Health Maintenance   She does not consume alcohol, has a negative alcohol test result, and no history of thyroid cancer or kidney stones. Emphasized the importance of maintaining a healthy diet and exercise routine. Continue abstaining from alcohol.    No orders of the defined types were placed in this encounter.   I discussed the assessment and treatment plan with the patient. The patient was provided an opportunity to ask questions and all were answered. The patient agreed with the plan and demonstrated an understanding of the instructions.   The patient was advised to call back or seek an in-person evaluation if the symptoms worsen or if the condition fails to improve as anticipated.  No follow-ups on file.  Angelena Sole, MD [facility] at [facility] [phone] (phone) [phone] (fax)  [facility]

## 2023-11-07 ENCOUNTER — Telehealth: Payer: Self-pay

## 2023-11-07 ENCOUNTER — Other Ambulatory Visit (HOSPITAL_COMMUNITY): Payer: Self-pay

## 2023-11-07 NOTE — Telephone Encounter (Signed)
 Pharmacy Patient Advocate Encounter  Received notification from [facility] Medicaid that Prior Authorization for Wegovy 0.25MG /0.5ML auto-injectors has been APPROVED from 11/06/23 to 05/04/24. Unable to obtain price due to refill too soon rejection, last fill date 11/06/23 next available fill date04/16/25   PA #/Case ID/Reference #: [id number]

## 2023-11-11 ENCOUNTER — Other Ambulatory Visit: Payer: Self-pay | Admitting: Family Medicine

## 2023-11-11 MED ORDER — EMGALITY 120 MG/ML ~~LOC~~ SOAJ: 120.0000 mg | SUBCUTANEOUS | 12 refills | Status: AC

## 2023-11-13 ENCOUNTER — Telehealth: Payer: Self-pay

## 2023-11-13 ENCOUNTER — Encounter: Payer: Self-pay | Admitting: *Deleted

## 2023-11-13 ENCOUNTER — Other Ambulatory Visit (HOSPITAL_COMMUNITY): Payer: Self-pay

## 2023-11-13 NOTE — Telephone Encounter (Signed)
 Pharmacy Patient Advocate Encounter  Received notification from [facility] that Prior Authorization for Emgality 120MG /ML auto-injectors (migraine)  has been APPROVED from 10/29/2023 to 02/10/2024  Approved quantity: 1 units per 30 day(s).   PA #/Case ID/Reference #: (Key: [id number])  PLEASE BE ADVISED LETTER OF APPROVAL HAS BEEN SCANNED TO MEDIA IN CHART

## 2023-11-13 NOTE — Telephone Encounter (Signed)
 Patient notified of message below.

## 2023-11-14 ENCOUNTER — Telehealth: Payer: Self-pay | Admitting: Family Medicine

## 2023-11-14 NOTE — Telephone Encounter (Signed)
 Copied from CRM [id number]. Topic: Clinical - Prescription Issue >> Nov 14, 2023  5:00 PM Denese Killings wrote: Reason for CRM: Pharmacy states that the prescription needs to be rewrote. Pharmacy states that the prescription doesn't have the proper instructions. In the first month it should have take 2 and one shot a month after that in regards to Galcanezumab-gnlm [facility]) 120 MG/ML.

## 2023-11-17 ENCOUNTER — Other Ambulatory Visit: Payer: Self-pay | Admitting: Family Medicine

## 2023-11-17 MED ORDER — EMGALITY 120 MG/ML ~~LOC~~ SOAJ: 240.0000 mg | Freq: Once | SUBCUTANEOUS | 0 refills | Status: AC

## 2023-11-17 NOTE — Telephone Encounter (Signed)
See message below and advise.

## 2023-11-19 ENCOUNTER — Telehealth: Payer: Self-pay | Admitting: Family Medicine

## 2023-11-19 NOTE — Telephone Encounter (Signed)
 Patient called, recording call could not be completed as dialed. Amlodipine is not on current or past list, will need to know if patient is taking this medication, routing to the office.  Copied from CRM [id number]. Topic: Clinical - Prescription Issue >> Nov 19, 2023  4:39 PM Melissa C wrote: Reason for CRM: patient wanted to get aMILoride (MIDAMOR) 5 MG tablet refilled, however pharmacy stated they attempted to do so awhile back and it was declined. Patient stated that her bottle's date is from a year ago but she just ran out of the medication because she takes it as needed. Please advise if this can be filled for her. If so, her pharmacy is [id number] [place], [place] - [address] [place] [address] Phone: [phone]  Fax: [phone]  If this cannot be filled, please contact patient. Thank you.

## 2023-11-20 NOTE — Telephone Encounter (Signed)
 Please see pt msg and advise. Per chart medication history states this was discontinued for change in therapy

## 2023-11-21 DIAGNOSIS — Z79899 Other long term (current) drug therapy: Secondary | ICD-10-CM | POA: Diagnosis not present

## 2023-11-21 DIAGNOSIS — S22000A Wedge compression fracture of unspecified thoracic vertebra, initial encounter for closed fracture: Secondary | ICD-10-CM | POA: Diagnosis not present

## 2023-11-21 DIAGNOSIS — R030 Elevated blood-pressure reading, without diagnosis of hypertension: Secondary | ICD-10-CM | POA: Diagnosis not present

## 2023-11-21 DIAGNOSIS — G8911 Acute pain due to trauma: Secondary | ICD-10-CM | POA: Diagnosis not present

## 2023-11-21 DIAGNOSIS — Z6839 Body mass index (BMI) 39.0-39.9, adult: Secondary | ICD-10-CM | POA: Diagnosis not present

## 2023-11-21 DIAGNOSIS — M545 Low back pain, unspecified: Secondary | ICD-10-CM | POA: Diagnosis not present

## 2023-11-21 DIAGNOSIS — M549 Dorsalgia, unspecified: Secondary | ICD-10-CM | POA: Diagnosis not present

## 2023-11-21 DIAGNOSIS — E6609 Other obesity due to excess calories: Secondary | ICD-10-CM | POA: Diagnosis not present

## 2023-11-22 DIAGNOSIS — Z419 Encounter for procedure for purposes other than remedying health state, unspecified: Secondary | ICD-10-CM | POA: Diagnosis not present

## 2023-11-24 ENCOUNTER — Encounter: Payer: Self-pay | Admitting: Gastroenterology

## 2023-11-24 ENCOUNTER — Telehealth: Payer: Self-pay | Admitting: Gastroenterology

## 2023-11-24 NOTE — Telephone Encounter (Signed)
 See 11/24/23 telephone call for additional information.

## 2023-11-24 NOTE — Telephone Encounter (Signed)
 I spoke with the patient and let her know that Dr. Cherryl Corona is out of the office this week.  She states that she and Dr. Cherryl Corona had a conversation about filling out the disability if her alcohol testing came back negative.  Patient states that she believes Dr. Cherryl Corona has a copy of the documents on his desk from a few months ago.  I did ask her to please contact her insurance in the event he does not, so we do  not have a further delay.  She verbalized understanding that we will call her back when Dr. Cherryl Corona returns to the office.

## 2023-11-24 NOTE — Telephone Encounter (Signed)
 Inbound call from patient stating she had sent over a mychart message regarding disability paperwork. Patient is requesting a call to discuss if Dr. Cherryl Corona can complete the paperwork. Please advise.

## 2023-11-25 ENCOUNTER — Other Ambulatory Visit: Payer: Self-pay | Admitting: Family Medicine

## 2023-11-25 DIAGNOSIS — K760 Fatty (change of) liver, not elsewhere classified: Secondary | ICD-10-CM

## 2023-11-25 DIAGNOSIS — Z79899 Other long term (current) drug therapy: Secondary | ICD-10-CM | POA: Diagnosis not present

## 2023-11-26 ENCOUNTER — Encounter: Payer: Self-pay | Admitting: Family Medicine

## 2023-11-26 ENCOUNTER — Other Ambulatory Visit: Payer: Self-pay | Admitting: Family Medicine

## 2023-11-26 ENCOUNTER — Telehealth: Payer: Self-pay

## 2023-11-26 MED ORDER — WEGOVY 0.5 MG/0.5ML ~~LOC~~ SOAJ: 0.5000 mg | SUBCUTANEOUS | 0 refills | Status: DC

## 2023-11-26 MED ORDER — WEGOVY 0.25 MG/0.5ML ~~LOC~~ SOAJ: 0.2500 mg | SUBCUTANEOUS | 0 refills | Status: DC

## 2023-11-26 NOTE — Telephone Encounter (Signed)
 Pharmacy Patient Advocate Encounter   Received notification from CoverMyMeds that prior authorization for [facility] 0.5MG /0.5ML auto-injectors is required/requested.   Insurance verification completed.   The patient is insured through [facility] [place] .   Per test claim: PA required; PA submitted to above mentioned insurance via CoverMyMeds Key/confirmation #/EOC [id number] Status is pending

## 2023-11-27 ENCOUNTER — Other Ambulatory Visit (HOSPITAL_COMMUNITY): Payer: Self-pay

## 2023-11-27 NOTE — Telephone Encounter (Signed)
 Pharmacy Patient Advocate Encounter  Received notification from [facility] Medicaid that Prior Authorization for Wegovy 0.5MG /0.5ML auto-injectors has been APPROVED from 11/26/2023 to 11/25/2024   PA #/Case ID/Reference #: # [id number]  Please be advised: Message from plan: Approved. This drug has been approved. Approved quantity: 2 milliliters per 28 day(s). You may fill up to a 34 day supply at a retail pharmacy. You may fill up to a 90 day supply for maintenance drugs, please refer to the formulary for details. Please call the pharmacy to process your prescription claim.. Authorization Expiration Date: November 25, 2024.

## 2023-11-28 ENCOUNTER — Other Ambulatory Visit (HOSPITAL_COMMUNITY): Payer: Self-pay

## 2023-12-01 ENCOUNTER — Other Ambulatory Visit (HOSPITAL_COMMUNITY): Payer: Self-pay

## 2023-12-01 NOTE — Telephone Encounter (Signed)
 I left a detailed message for the patient with Dr. Milus Alpha response.  She is asked to call back if she has any additional questions or concerns.

## 2023-12-05 ENCOUNTER — Ambulatory Visit: Admitting: Family Medicine

## 2023-12-11 ENCOUNTER — Other Ambulatory Visit: Payer: Self-pay | Admitting: Orthopedic Surgery

## 2023-12-11 ENCOUNTER — Other Ambulatory Visit: Payer: Self-pay | Admitting: Family Medicine

## 2023-12-11 DIAGNOSIS — E559 Vitamin D deficiency, unspecified: Secondary | ICD-10-CM

## 2023-12-11 NOTE — Telephone Encounter (Signed)
 Patient notified

## 2023-12-11 NOTE — Telephone Encounter (Signed)
 Doesn't need this big of a dose.  Take 2000iu/d otc

## 2023-12-12 ENCOUNTER — Encounter: Payer: Self-pay | Admitting: Neurology

## 2023-12-12 ENCOUNTER — Ambulatory Visit: Admitting: Family Medicine

## 2023-12-15 ENCOUNTER — Telehealth: Payer: Self-pay

## 2023-12-15 NOTE — Telephone Encounter (Signed)
 Call to patient to discuss sooner appointment and get info about seizures. No answer, unable to leave message.

## 2023-12-16 ENCOUNTER — Ambulatory Visit: Admitting: Family Medicine

## 2023-12-17 ENCOUNTER — Other Ambulatory Visit (HOSPITAL_COMMUNITY): Payer: Self-pay

## 2023-12-17 ENCOUNTER — Encounter: Payer: Self-pay | Admitting: Family Medicine

## 2023-12-17 ENCOUNTER — Telehealth: Payer: Self-pay

## 2023-12-17 ENCOUNTER — Ambulatory Visit: Admitting: Family Medicine

## 2023-12-17 VITALS — BP 124/77 | HR 100 | Temp 97.5°F | Resp 18 | Ht 67.0 in | Wt 255.4 lb

## 2023-12-17 DIAGNOSIS — D500 Iron deficiency anemia secondary to blood loss (chronic): Secondary | ICD-10-CM

## 2023-12-17 DIAGNOSIS — D509 Iron deficiency anemia, unspecified: Secondary | ICD-10-CM

## 2023-12-17 DIAGNOSIS — E538 Deficiency of other specified B group vitamins: Secondary | ICD-10-CM | POA: Diagnosis not present

## 2023-12-17 DIAGNOSIS — R600 Localized edema: Secondary | ICD-10-CM | POA: Diagnosis not present

## 2023-12-17 DIAGNOSIS — K760 Fatty (change of) liver, not elsewhere classified: Secondary | ICD-10-CM | POA: Diagnosis not present

## 2023-12-17 DIAGNOSIS — F4323 Adjustment disorder with mixed anxiety and depressed mood: Secondary | ICD-10-CM | POA: Diagnosis not present

## 2023-12-17 DIAGNOSIS — E559 Vitamin D deficiency, unspecified: Secondary | ICD-10-CM

## 2023-12-17 LAB — COMPREHENSIVE METABOLIC PANEL WITH GFR
ALT: 118 U/L — ABNORMAL HIGH (ref 0–35)
AST: 116 U/L — ABNORMAL HIGH (ref 0–37)
Albumin: 3.9 g/dL (ref 3.5–5.2)
Alkaline Phosphatase: 56 U/L (ref 39–117)
BUN: 10 mg/dL (ref 6–23)
CO2: 23 meq/L (ref 19–32)
Calcium: 9 mg/dL (ref 8.4–10.5)
Chloride: 105 meq/L (ref 96–112)
Creatinine, Ser: 0.67 mg/dL (ref 0.40–1.20)
GFR: 107.81 mL/min (ref >60.00)
Glucose, Bld: 152 mg/dL — ABNORMAL HIGH (ref 70–99)
Potassium: 3.6 meq/L (ref 3.5–5.1)
Sodium: 137 meq/L (ref 135–145)
Total Bilirubin: 0.4 mg/dL (ref 0.2–1.2)
Total Protein: 7.1 g/dL (ref 6.0–8.3)

## 2023-12-17 LAB — CBC WITH DIFFERENTIAL/PLATELET
Basophils Absolute: 0 10*3/uL (ref 0.0–0.1)
Basophils Relative: 0.5 % (ref 0.0–3.0)
Eosinophils Absolute: 0.2 10*3/uL (ref 0.0–0.7)
Eosinophils Relative: 2.5 % (ref 0.0–5.0)
HCT: 36.8 % (ref 36.0–46.0)
Hemoglobin: 11.9 g/dL — ABNORMAL LOW (ref 12.0–15.0)
Lymphocytes Relative: 24.3 % (ref 12.0–46.0)
Lymphs Abs: 2 10*3/uL (ref 0.7–4.0)
MCHC: 32.3 g/dL (ref 30.0–36.0)
MCV: 85.8 fl (ref 78.0–100.0)
Monocytes Absolute: 0.3 10*3/uL (ref 0.1–1.0)
Monocytes Relative: 4 % (ref 3.0–12.0)
Neutro Abs: 5.6 10*3/uL (ref 1.4–7.7)
Neutrophils Relative %: 68.7 % (ref 43.0–77.0)
Platelets: 306 10*3/uL (ref 150.0–400.0)
RBC: 4.29 Mil/uL (ref 3.87–5.11)
RDW: 13.5 % (ref 11.5–15.5)
WBC: 8.2 10*3/uL (ref 4.0–10.5)

## 2023-12-17 LAB — GAMMA GT: GGT: 92 U/L — ABNORMAL HIGH (ref 7–51)

## 2023-12-17 MED ORDER — CYANOCOBALAMIN 1000 MCG/ML IJ SOLN
1000.0000 ug | Freq: Once | INTRAMUSCULAR | Status: AC
  Administered 2023-12-17: 1000 ug via INTRAMUSCULAR

## 2023-12-17 MED ORDER — HYDROCHLOROTHIAZIDE 25 MG PO TABS
25.0000 mg | ORAL_TABLET | Freq: Every day | ORAL | 0 refills | Status: DC | PRN
Start: 2023-12-17 — End: 2024-03-18

## 2023-12-17 MED ORDER — PROMETHAZINE HCL 25 MG PO TABS: 25.0000 mg | ORAL_TABLET | Freq: Every day | ORAL | 0 refills | Status: AC

## 2023-12-17 MED ORDER — VITAMIN D (ERGOCALCIFEROL) 1.25 MG (50000 UNIT) PO CAPS: 50000.0000 [IU] | ORAL_CAPSULE | ORAL | 0 refills | Status: DC

## 2023-12-17 MED ORDER — SPIRONOLACTONE 25 MG PO TABS: 25.0000 mg | ORAL_TABLET | Freq: Every day | ORAL | 3 refills | Status: AC

## 2023-12-17 MED ORDER — WEGOVY 1 MG/0.5ML ~~LOC~~ SOAJ: 1.0000 mg | SUBCUTANEOUS | 0 refills | Status: DC

## 2023-12-17 MED ORDER — OMEPRAZOLE 40 MG PO CPDR: 40.0000 mg | DELAYED_RELEASE_CAPSULE | Freq: Every day | ORAL | 1 refills | Status: DC

## 2023-12-17 MED ORDER — KLOR-CON M20 20 MEQ PO TBCR: 40.0000 meq | EXTENDED_RELEASE_TABLET | Freq: Three times a day (TID) | ORAL | 1 refills | Status: DC

## 2023-12-17 MED ORDER — BUSPIRONE HCL 10 MG PO TABS: 10.0000 mg | ORAL_TABLET | Freq: Two times a day (BID) | ORAL | 0 refills | Status: DC

## 2023-12-17 NOTE — Progress Notes (Signed)
 Subjective:     Patient ID: Janice Arnold, female    DOB: [birth year], 43 y.o.   MRN: [id number]  Chief Complaint  Patient presents with   Medical Management of Chronic Issues    2 month follow-up    HPI Discussed the use of AI scribe software for clinical note transcription with the patient, who gave verbal consent to proceed.  History of Present Illness Janice Arnold is a 43 year old female with fatty liver disease and weight management issues who presents with concerns about weight gain and medication side effects.  She is experiencing weight gain despite taking Wegovy  for her fatty liver disease and weight management. Initially, the medication reduced her appetite, but she now craves sugary foods, which is unusual for her. She has been on Wegovy  for7 weeks, administering it on Fridays. She is concerned about gaining weight after previously losing it without medication.  She has a history of changing her birth control, which she believes may be contributing to her symptoms. She inadvertently switched to a birth control that previously caused similar symptoms, and after switching back, her symptoms improved for about a week before returning.  She has recently stopped taking Wellbutrin  and is experiencing increased stress and difficulty managing her mental health. She feels overwhelmed and unable to focus, with disrupted sleep, waking frequently after short periods. She describes her sleep as 'not real sleep' and finds it exhausting.  No SI.  Off wellbutrin  d/t sz  She is currently taking amitriptyline , which she believes may be suppressing cyclic vomiting. She has experienced nausea and vomiting recently, which she attributes to a mental process rather than medication.  She is dealing with back pain, which limits her ability to exercise. She has been prescribed baclofen and another medication she cannot recall, but finds that Flexeril makes her too sleepy. She has received cortisone  or steroid shots for pain relief, which have been effective.  She is taking Emgality  for migraines but continues to experience headaches. She reports experiencing mini seizures and worsening dyslexia, which affects her ability to focus and communicate.  Her husband is recovering from a hospital stay and is scheduled for a hip replacement. She is managing his care at home, which adds to her stress. She is also dealing with financial constraints, impacting her ability to purchase exercise equipment.    Health Maintenance Due  Topic Date Due   Cervical Cancer Screening (HPV/Pap Cotest)  Never done   MAMMOGRAM  04/29/2020    Past Medical History:  Diagnosis Date   Alcohol abuse    history of heavy alcohol abuse, last drank alcohol in 06/2022   Anemia 2023   s/p iron  infusions   Anxiety    Follows with PCP. Dr. Eusebio High.   Compression fracture of body of thoracic vertebra (HCC) 11/05/2022   T4, T5, T6 / Follows w/ orthopedic, Dr. Colette Davies. Fractures occurred while patient was having a seizure, per patient.   Cyclic vomiting syndrome    hospital admission on 05/28/22 for vomiting, follows with gastroenterologist Dr. Darol Elizabeth.   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 Darol Elizabeth.   GERD (gastroesophageal reflux disease)    takes omeprazole  daily   Heart murmur    since childhood   Hyperlipidemia    11/20/22 lipid panel in Epic   Hypokalemia 10/15/2022  K+ level 2.5   Menorrhagia    Palpitations    Follows w/ cardiology, Berkley Breech, NP. 11/14/2022 echocardiogram EF 60 65%, 12/31/22 Event heart monitor results in Epic.   PONV (postoperative nausea and vomiting)    Prolonged QT interval    Follows w/ cardiology, Marlana Silvan, NP.   Seizure (HCC) 06/06/2022   Follows with neurologist, Dr. Sol Duos @ [facility]. Seizures  were thought to be related to stopping alcohol. However, patient has had seizures months after quitting alcohol. As of 02/06/23, last seizure was on 11/07/22. Keppra  was then increased to bid.   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 See 11/05/22 Head CT and MRI in Epic. Resolved per pt on 02/06/23.   Tremors of nervous system    whole body tremors, follows with Dr. Sol Duos @ [facility].   Vitamin deficiency 2023   B12 deficiency, receiving monthly B12 injections / Vitamin D  deficiency, taking supplement every two weeks   Wears contact lenses    Wears glasses     Past Surgical History:  Procedure Laterality Date   COLONOSCOPY WITH ESOPHAGOGASTRODUODENOSCOPY (EGD)  03/27/2022   one rectal polyp   LAPAROSCOPIC APPENDECTOMY N/A 03/02/2022   Procedure: APPENDECTOMY LAPAROSCOPIC;  Surgeon: Dareen Ebbing, MD;  Location: [facility];  Service: General;  Laterality: N/A;   ROBOTIC ASSISTED LAPAROSCOPIC HYSTERECTOMY AND SALPINGECTOMY Bilateral 03/04/2023   Procedure: ABORTED XI ROBOTIC ASSISTED LAPAROSCOPIC HYSTERECTOMY AND SALPINGECTOMY;  Surgeon: Meriam Stamp, MD;  Location: [facility];  Service: Gynecology;  Laterality: Bilateral;  Requests 2/1/2 hrs.     Current Outpatient Medications:    amitriptyline  (ELAVIL ) 10 MG tablet, Take 2 tablets (20 mg total) by mouth at bedtime., Disp: 180 tablet, Rfl: 1   baclofen (LIORESAL) 10 MG tablet, Take 10 mg by mouth 3 (three) times daily., Disp: , Rfl:    DULoxetine  (CYMBALTA ) 60 MG capsule, Take 1 capsule (60 mg total) by mouth daily., Disp: 90 capsule, Rfl: 1   Galcanezumab -gnlm (EMGALITY ) 120 MG/ML SOAJ, Inject 120 mg into the skin every 30 (thirty) days., Disp: 1.12 mL, Rfl: 12   hydrOXYzine  (VISTARIL ) 25 MG capsule, Take 1 capsule (25 mg total) by mouth every 6 (six) hours as needed., Disp: 360 capsule, Rfl: 1   levETIRAcetam  (KEPPRA ) 750 MG tablet, Take  750 mg by mouth 2 (two) times daily. 1 tablet twice a day, Disp: , Rfl:    lidocaine  (LIDODERM ) 5 %, PLACE 1 PATCH ONTO THE SKIN DAILY. REMOVE & DISCARD PATCH WITHIN 12 HOURS OR AS DIRECTED BY MD, Disp: 30 patch, Rfl: 0   melatonin 5 MG TABS, Take 5 mg by mouth. 25 mg total, Disp: , Rfl:    metoprolol  succinate (TOPROL -XL) 25 MG 24 hr tablet, TAKE 1 TABLET (25 MG TOTAL) BY MOUTH DAILY., Disp: 90 tablet, Rfl: 3   naloxone (NARCAN) nasal spray 4 mg/0.1 mL, SMARTSIG:Both Nares, Disp: , Rfl:    norethindrone  (AYGESTIN ) 5 MG tablet, Take by mouth daily., Disp: , Rfl:    norgestimate-ethinyl estradiol (ORTHO-CYCLEN) 0.25-35 MG-MCG tablet, Take 1 tablet by mouth daily., Disp: , Rfl:    ondansetron  (ZOFRAN -ODT) 4 MG disintegrating tablet, TAKE 1 TABLET BY MOUTH EVERY 8 HOURS AS NEEDED FOR NAUSEA AND VOMITING, Disp: 20 tablet, Rfl: 1   promethazine  (PHENERGAN ) 25 MG suppository, Place 1 suppository (25 mg total) rectally every 6 (six) hours as needed for nausea or vomiting., Disp: 12 each, Rfl:  3   Semaglutide -Weight Management (WEGOVY ) 1 MG/0.5ML SOAJ, Inject 1 mg into the skin once a week., Disp: 2 mL, Rfl: 0   traMADol  (ULTRAM ) 50 MG tablet, Take 50 mg by mouth every 6 (six) hours as needed., Disp: , Rfl:    triamcinolone  cream (KENALOG ) 0.1 %, Apply 1 Application topically 2 (two) times daily. (Patient taking differently: Apply 1 Application topically 2 (two) times daily. As needed), Disp: 30 g, Rfl: 0   busPIRone  (BUSPAR ) 10 MG tablet, Take 1 tablet (10 mg total) by mouth 2 (two) times daily., Disp: 180 tablet, Rfl: 0   celecoxib (CELEBREX) 100 MG capsule, Take 100 mg by mouth 2 (two) times daily. (Patient not taking: Reported on 12/17/2023), Disp: , Rfl:    hydrochlorothiazide  (HYDRODIURIL ) 25 MG tablet, Take 1 tablet (25 mg total) by mouth daily as needed., Disp: 90 tablet, Rfl: 0   KLOR-CON  M20 20 MEQ tablet, Take 2 tablets (40 mEq total) by mouth 3 (three) times daily., Disp: 540 tablet, Rfl: 1    omeprazole  (PRILOSEC) 40 MG capsule, Take 1 capsule (40 mg total) by mouth daily., Disp: 90 capsule, Rfl: 1   promethazine  (PHENERGAN ) 25 MG tablet, Take 1 tablet (25 mg total) by mouth daily., Disp: 90 tablet, Rfl: 0   spironolactone  (ALDACTONE ) 25 MG tablet, Take 1 tablet (25 mg total) by mouth daily., Disp: 90 tablet, Rfl: 3   Vitamin D , Ergocalciferol , (DRISDOL ) 1.25 MG (50000 UNIT) CAPS capsule, Take 1 capsule (50,000 Units total) by mouth every 7 (seven) days., Disp: 12 capsule, Rfl: 0  Current Facility-Administered Medications:    cyanocobalamin  (VITAMIN B12) injection 1,000 mcg, 1,000 mcg, Intramuscular, Once, Anais Denslow Marie, MD  No Known Allergies ROS neg/noncontributory except as noted HPI/below      Objective:     BP 124/77   Pulse 100   Temp (!) 97.5 F (36.4 C) (Temporal)   Resp 18   Ht 5\' 7"  (1.702 m)   Wt 255 lb 6 oz (115.8 kg)   LMP 01/29/2023 (Approximate)   SpO2 95%   BMI 40.00 kg/m  Wt Readings from Last 3 Encounters:  12/17/23 255 lb 6 oz (115.8 kg)  11/03/23 243 lb 8 oz (110.5 kg)  10/30/23 244 lb (110.7 kg)    Physical Exam   Gen: WDWN NAD HEENT: NCAT, conjunctiva not injected, sclera nonicteric NECK:  supple, no thyromegaly, no nodes, no carotid bruits CARDIAC: RRR, S1S2+, no murmur. DP 2+B LUNGS: CTAB. No wheezes ABDOMEN:  BS+, soft, NTND, No HSM, no masses EXT:  no edema MSK: no gross abnormalities.  NEURO: A&O x3.  CN II-XII intact.  PSYCH: normal mood. Good eye contact     Assessment & Plan:  Metabolic dysfunction-associated steatotic liver disease (MASLD) -     CBC with Differential/Platelet -     Comprehensive metabolic panel with GFR -     Gamma GT  Adjustment disorder with mixed anxiety and depressed mood -     busPIRone  HCl; Take 1 tablet (10 mg total) by mouth 2 (two) times daily.  Dispense: 180 tablet; Refill: 0  Iron  deficiency anemia due to chronic blood loss  B12 deficiency -     Cyanocobalamin   Local edema -      Spironolactone ; Take 1 tablet (25 mg total) by mouth daily.  Dispense: 90 tablet; Refill: 3 -     hydroCHLOROthiazide ; Take 1 tablet (25 mg total) by mouth daily as needed.  Dispense: 90 tablet; Refill: 0  Iron  deficiency anemia,  unspecified iron  deficiency anemia type -     Omeprazole ; Take 1 capsule (40 mg total) by mouth daily.  Dispense: 90 capsule; Refill: 1  Vitamin D  deficiency -     Vitamin D  (Ergocalciferol ); Take 1 capsule (50,000 Units total) by mouth every 7 (seven) days.  Dispense: 12 capsule; Refill: 0  Other orders -     Wegovy ; Inject 1 mg into the skin once a week.  Dispense: 2 mL; Refill: 0 -     Klor-Con  M20; Take 2 tablets (40 mEq total) by mouth 3 (three) times daily.  Dispense: 540 tablet; Refill: 1 -     Promethazine  HCl; Take 1 tablet (25 mg total) by mouth daily.  Dispense: 90 tablet; Refill: 0  Assessment and Plan Assessment & Plan Weight gain   She experiences weight gain despite using Wegovy , influenced by medication changes, cravings for sugary foods, and stress. Back pain limits exercise, and amitriptyline  may contribute to weight gain. Increase physical activity as tolerated, considering back pain. Try chocolate protein drinks and herbal teas to manage cravings. Adjust amitriptyline  dosage to 2 tablets one day, 1 tablet the next, to assess impact on weight and nausea control.  Fatty liver disease   She has fatty liver disease with elevated liver enzymes. Wegovy  is used for weight management and potential liver function improvement. Alcohol use is ruled out as a cause. Continue Wegovy  and follow up with Dr. Cherryl Corona in June for further evaluation and blood work.  Nausea and vomiting   She experiences intermittent nausea and vomiting, possibly related to medication changes. Amitriptyline  may suppress cyclic vomiting. Recent episodes were not severe and may be stress-related. Adjust amitriptyline  dosage to assess impact on nausea control. Still taking phenergan   daily  Migraine   She has ongoing migraines despite Emgality , which may take months to reduce frequency and severity. The current goal is to reduce frequency and intensity. Continue Emgality  as prescribed and consider follow-up with neurology for further migraine management.  Seizure disorder   She has a seizure disorder with recent mini seizures and dyslexia. Wellbutrin  is not recommended due to potential to lower seizure threshold. Follow up with neurology for seizure management and evaluation of recent symptoms.  Anxiety   She experiences anxiety with increased stress levels. Current treatment includes buspirone , with consideration to increase dosage for better control. Increase buspirone  to 10 mg twice daily.continue duloxetine  60mg  daily  Chronic edema-controlled on hydrochlorothiazide  25mg  and aldactone  25mg  daily.  Still taking K 3/d.  Check labs  B12 deficiency-getting monthly injections    Return in about 4 weeks (around 01/14/2024) for chronic follow-up.  Ellsworth Haas, MD

## 2023-12-17 NOTE — Progress Notes (Signed)
 Liver much better.  But now the anemia has recurred and I'm concerned how fast.  Repeat cbcd on Monday

## 2023-12-17 NOTE — Telephone Encounter (Signed)
 Pharmacy Patient Advocate Encounter   Received notification from CoverMyMeds that prior authorization for Wegovy  1MG /0.5ML auto-injectors is required/requested.   Insurance verification completed.   The patient is insured through [facility] [place] .   Per test claim: PA required; PA started via CoverMyMeds. KEY [id number] . Waiting for clinical questions to populate.

## 2023-12-17 NOTE — Patient Instructions (Signed)
 It was very nice to see you today!  Try herbal teas, chocolate protein drinks   PLEASE NOTE:  If you had any lab tests please let us  know if you have not heard back within a few days. You may see your results on MyChart before we have a chance to review them but we will give you a call once they are reviewed by us . If we ordered any referrals today, please let us  know if you have not heard from their office within the next week.   Please try these tips to maintain a healthy lifestyle:  Eat most of your calories during the day when you are active. Eliminate processed foods including packaged sweets (pies, cakes, cookies), reduce intake of potatoes, white bread, white pasta, and white rice. Look for whole grain options, oat flour or almond flour.  Each meal should contain half fruits/vegetables, one quarter protein, and one quarter carbs (no bigger than a computer mouse).  Cut down on sweet beverages. This includes juice, soda, and sweet tea. Also watch fruit intake, though this is a healthier sweet option, it still contains natural sugar! Limit to 3 servings daily.  Drink at least 1 glass of water  with each meal and aim for at least 8 glasses per day  Exercise at least 150 minutes every week.

## 2023-12-18 ENCOUNTER — Other Ambulatory Visit (HOSPITAL_COMMUNITY): Payer: Self-pay

## 2023-12-18 NOTE — Telephone Encounter (Signed)
 Pharmacy Patient Advocate Encounter  Received notification from [facility] that Prior Authorization for Wegovy  1MG /0.5ML auto-injectorshas been APPROVED from 12/17/2023 to 06/14/2024 with QUANTITY LIMIT of 2 PEN for a 28  day supply. SEE APPROVAL MESSAGE BELOW   KEY [id number]  Message from plan: Approved. This drug has been approved. Approved quantity: 2 pens per 28 day(s). You may fill up to a 34 day supply at a retail pharmacy. You may fill up to a 90 day supply for maintenance drugs, please refer to the formulary for details. Please call the pharmacy to process your prescription claim.. Authorization Expiration Date: June 14, 2024.

## 2023-12-22 ENCOUNTER — Encounter: Payer: Self-pay | Admitting: Family Medicine

## 2023-12-22 DIAGNOSIS — Z419 Encounter for procedure for purposes other than remedying health state, unspecified: Secondary | ICD-10-CM | POA: Diagnosis not present

## 2023-12-22 NOTE — Telephone Encounter (Signed)
 Please see pt msg and advise if wanting to increase medication or continue taking as prescribed in last office note as 1- 10 mg BID.

## 2023-12-23 ENCOUNTER — Other Ambulatory Visit: Payer: Self-pay | Admitting: Family Medicine

## 2023-12-23 ENCOUNTER — Other Ambulatory Visit: Payer: Self-pay | Admitting: *Deleted

## 2023-12-23 ENCOUNTER — Other Ambulatory Visit (INDEPENDENT_AMBULATORY_CARE_PROVIDER_SITE_OTHER)

## 2023-12-23 ENCOUNTER — Ambulatory Visit: Payer: Self-pay | Admitting: Family Medicine

## 2023-12-23 DIAGNOSIS — F4323 Adjustment disorder with mixed anxiety and depressed mood: Secondary | ICD-10-CM

## 2023-12-23 DIAGNOSIS — D509 Iron deficiency anemia, unspecified: Secondary | ICD-10-CM

## 2023-12-23 LAB — CBC WITH DIFFERENTIAL/PLATELET
Basophils Absolute: 0.1 10*3/uL (ref 0.0–0.1)
Basophils Relative: 0.7 % (ref 0.0–3.0)
Eosinophils Absolute: 0.2 10*3/uL (ref 0.0–0.7)
Eosinophils Relative: 1.9 % (ref 0.0–5.0)
HCT: 37.3 % (ref 36.0–46.0)
Hemoglobin: 11.9 g/dL — ABNORMAL LOW (ref 12.0–15.0)
Lymphocytes Relative: 26.5 % (ref 12.0–46.0)
Lymphs Abs: 2.5 10*3/uL (ref 0.7–4.0)
MCHC: 31.9 g/dL (ref 30.0–36.0)
MCV: 86 fl (ref 78.0–100.0)
Monocytes Absolute: 0.4 10*3/uL (ref 0.1–1.0)
Monocytes Relative: 4.1 % (ref 3.0–12.0)
Neutro Abs: 6.4 10*3/uL (ref 1.4–7.7)
Neutrophils Relative %: 66.8 % (ref 43.0–77.0)
Platelets: 332 10*3/uL (ref 150.0–400.0)
RBC: 4.33 Mil/uL (ref 3.87–5.11)
RDW: 13.3 % (ref 11.5–15.5)
WBC: 9.6 10*3/uL (ref 4.0–10.5)

## 2023-12-23 MED ORDER — BUSPIRONE HCL 10 MG PO TABS: 20.0000 mg | ORAL_TABLET | Freq: Two times a day (BID) | ORAL | 0 refills | Status: DC

## 2023-12-23 MED ORDER — BUSPIRONE HCL 10 MG PO TABS
20.0000 mg | ORAL_TABLET | Freq: Two times a day (BID) | ORAL | 0 refills | Status: DC
Start: 2023-12-23 — End: 2023-12-23

## 2023-12-23 NOTE — Progress Notes (Signed)
 Stable

## 2023-12-25 DIAGNOSIS — G8911 Acute pain due to trauma: Secondary | ICD-10-CM | POA: Diagnosis not present

## 2023-12-25 DIAGNOSIS — M545 Low back pain, unspecified: Secondary | ICD-10-CM | POA: Diagnosis not present

## 2023-12-25 DIAGNOSIS — Z79899 Other long term (current) drug therapy: Secondary | ICD-10-CM | POA: Diagnosis not present

## 2023-12-25 DIAGNOSIS — S22000A Wedge compression fracture of unspecified thoracic vertebra, initial encounter for closed fracture: Secondary | ICD-10-CM | POA: Diagnosis not present

## 2023-12-25 DIAGNOSIS — Z6841 Body Mass Index (BMI) 40.0 and over, adult: Secondary | ICD-10-CM | POA: Diagnosis not present

## 2023-12-25 DIAGNOSIS — E6609 Other obesity due to excess calories: Secondary | ICD-10-CM | POA: Diagnosis not present

## 2023-12-25 DIAGNOSIS — R030 Elevated blood-pressure reading, without diagnosis of hypertension: Secondary | ICD-10-CM | POA: Diagnosis not present

## 2023-12-25 DIAGNOSIS — Z6839 Body mass index (BMI) 39.0-39.9, adult: Secondary | ICD-10-CM | POA: Diagnosis not present

## 2023-12-29 ENCOUNTER — Inpatient Hospital Stay: Payer: Medicaid Other | Admitting: Medical Oncology

## 2023-12-29 ENCOUNTER — Inpatient Hospital Stay

## 2023-12-29 ENCOUNTER — Inpatient Hospital Stay: Admitting: Medical Oncology

## 2023-12-29 ENCOUNTER — Inpatient Hospital Stay: Payer: Medicaid Other

## 2023-12-30 DIAGNOSIS — Z79899 Other long term (current) drug therapy: Secondary | ICD-10-CM | POA: Diagnosis not present

## 2024-01-07 ENCOUNTER — Inpatient Hospital Stay: Admitting: Medical Oncology

## 2024-01-07 ENCOUNTER — Inpatient Hospital Stay: Attending: Family Medicine

## 2024-01-07 ENCOUNTER — Other Ambulatory Visit: Payer: Self-pay | Admitting: Neurology

## 2024-01-08 ENCOUNTER — Encounter: Payer: Self-pay | Admitting: Family

## 2024-01-08 ENCOUNTER — Other Ambulatory Visit: Payer: Self-pay

## 2024-01-08 DIAGNOSIS — R569 Unspecified convulsions: Secondary | ICD-10-CM

## 2024-01-08 NOTE — Progress Notes (Signed)
 EEG order placed due to current one expiring

## 2024-01-11 ENCOUNTER — Other Ambulatory Visit: Payer: Self-pay | Admitting: Orthopedic Surgery

## 2024-01-13 ENCOUNTER — Encounter: Payer: Self-pay | Admitting: Family Medicine

## 2024-01-14 ENCOUNTER — Other Ambulatory Visit: Admitting: *Deleted

## 2024-01-14 ENCOUNTER — Telehealth: Payer: Self-pay | Admitting: Neurology

## 2024-01-14 ENCOUNTER — Other Ambulatory Visit: Payer: Self-pay | Admitting: *Deleted

## 2024-01-14 ENCOUNTER — Ambulatory Visit: Admitting: Family Medicine

## 2024-01-14 MED ORDER — WEGOVY 1.7 MG/0.75ML ~~LOC~~ SOAJ: 1.7000 mg | SUBCUTANEOUS | 0 refills | Status: DC

## 2024-01-14 NOTE — Telephone Encounter (Signed)
 Request to r/s appointment

## 2024-01-19 ENCOUNTER — Ambulatory Visit: Admitting: Family Medicine

## 2024-01-20 ENCOUNTER — Ambulatory Visit: Admitting: Family Medicine

## 2024-01-21 ENCOUNTER — Other Ambulatory Visit: Payer: Self-pay | Admitting: Family Medicine

## 2024-01-22 DIAGNOSIS — Z419 Encounter for procedure for purposes other than remedying health state, unspecified: Secondary | ICD-10-CM | POA: Diagnosis not present

## 2024-01-26 ENCOUNTER — Ambulatory Visit: Admitting: *Deleted

## 2024-01-26 DIAGNOSIS — R569 Unspecified convulsions: Secondary | ICD-10-CM | POA: Diagnosis not present

## 2024-01-26 DIAGNOSIS — E6609 Other obesity due to excess calories: Secondary | ICD-10-CM | POA: Diagnosis not present

## 2024-01-26 DIAGNOSIS — Z79899 Other long term (current) drug therapy: Secondary | ICD-10-CM | POA: Diagnosis not present

## 2024-01-26 DIAGNOSIS — R29818 Other symptoms and signs involving the nervous system: Secondary | ICD-10-CM | POA: Diagnosis not present

## 2024-01-26 DIAGNOSIS — R030 Elevated blood-pressure reading, without diagnosis of hypertension: Secondary | ICD-10-CM | POA: Diagnosis not present

## 2024-01-26 DIAGNOSIS — S22000A Wedge compression fracture of unspecified thoracic vertebra, initial encounter for closed fracture: Secondary | ICD-10-CM | POA: Diagnosis not present

## 2024-01-26 DIAGNOSIS — Z6839 Body mass index (BMI) 39.0-39.9, adult: Secondary | ICD-10-CM | POA: Diagnosis not present

## 2024-01-26 DIAGNOSIS — G8911 Acute pain due to trauma: Secondary | ICD-10-CM | POA: Diagnosis not present

## 2024-01-26 DIAGNOSIS — Z6841 Body Mass Index (BMI) 40.0 and over, adult: Secondary | ICD-10-CM | POA: Diagnosis not present

## 2024-01-26 DIAGNOSIS — M545 Low back pain, unspecified: Secondary | ICD-10-CM | POA: Diagnosis not present

## 2024-01-26 NOTE — Procedures (Signed)
    History:  43 year old woman with seizure   EEG classification: Awake and drowsy   Duration: 27 minutes   Technical aspects: This EEG study was done with scalp electrodes positioned according to the 10-20 International system of electrode placement. Electrical activity was reviewed with band pass filter of 1-70Hz , sensitivity of 7 uV/mm, display speed of 55mm/sec with a 60Hz  notched filter applied as appropriate. EEG data were recorded continuously and digitally stored.   Description of the recording: The background rhythms of this recording consists of a fairly well modulated medium amplitude alpha rhythm of 9 Hz that is reactive to eye opening and closure. Present in the anterior head region is a 15-20 Hz beta activity. Photic stimulation was performed, did not show any abnormalities. Hyperventilation was also performed, did not show any abnormalities. Drowsiness was manifested by background fragmentation. No abnormal epileptiform discharges seen during this recording. There was no focal slowing. There were no electrographic seizure identified.   Abnormality: None   Impression: This is a normal awake and drowsy EEG. No evidence of interictal epileptiform discharges. Normal EEGs, however, do not rule out epilepsy.    Jerita Wimbush, MD [facility]

## 2024-01-27 ENCOUNTER — Ambulatory Visit: Payer: Self-pay | Admitting: Neurology

## 2024-01-28 ENCOUNTER — Ambulatory Visit: Admitting: Orthopedic Surgery

## 2024-01-29 ENCOUNTER — Encounter: Payer: Self-pay | Admitting: Gastroenterology

## 2024-01-29 NOTE — Telephone Encounter (Signed)
 error

## 2024-01-30 ENCOUNTER — Encounter: Payer: Self-pay | Admitting: Orthopedic Surgery

## 2024-02-02 ENCOUNTER — Other Ambulatory Visit: Payer: Self-pay | Admitting: Family Medicine

## 2024-02-02 ENCOUNTER — Telehealth: Payer: Self-pay

## 2024-02-02 DIAGNOSIS — E6609 Other obesity due to excess calories: Secondary | ICD-10-CM

## 2024-02-02 MED ORDER — ZEPBOUND 7.5 MG/0.5ML ~~LOC~~ SOAJ: 7.5000 mg | SUBCUTANEOUS | 0 refills | Status: DC

## 2024-02-02 MED ORDER — AMITRIPTYLINE HCL 10 MG PO TABS: 30.0000 mg | ORAL_TABLET | Freq: Every day | ORAL | 0 refills | Status: DC

## 2024-02-02 NOTE — Telephone Encounter (Signed)
 Pharmacy Patient Advocate Encounter   Received notification from CoverMyMeds that prior authorization for Zepbound 7.5MG /0.5ML pen-injectors is required/requested.   Insurance verification completed.   The patient is insured through [facility] [place] .   Per test claim: PA required; PA submitted to above mentioned insurance via CoverMyMeds Key/confirmation #/EOC [id number] Status is pending

## 2024-02-02 NOTE — Addendum Note (Signed)
 Addended by: CLAUDENE NAOMIE SAILOR on: 02/02/2024 08:18 AM   Modules accepted: Orders

## 2024-02-03 ENCOUNTER — Other Ambulatory Visit (HOSPITAL_COMMUNITY): Payer: Self-pay

## 2024-02-03 ENCOUNTER — Ambulatory Visit: Admitting: Orthopedic Surgery

## 2024-02-03 ENCOUNTER — Ambulatory Visit: Payer: Managed Care, Other (non HMO) | Admitting: Neurology

## 2024-02-04 ENCOUNTER — Other Ambulatory Visit (HOSPITAL_COMMUNITY): Payer: Self-pay

## 2024-02-04 NOTE — Telephone Encounter (Signed)
 Pharmacy Patient Advocate Encounter  Received notification from [employer] that Prior Authorization for Zepbound 7.5MG /0.5ML pen-injectors  has been APPROVED from 02/02/2024 to 05/02/2024. Unable to obtain price due to refill too soon rejection, last fill date 02/03/2024 next available fill date07/21/2025   PA #/Case ID/Reference #: [id number]

## 2024-02-06 ENCOUNTER — Encounter: Payer: Self-pay | Admitting: Neurology

## 2024-02-06 ENCOUNTER — Encounter: Payer: Self-pay | Admitting: Gastroenterology

## 2024-02-06 ENCOUNTER — Other Ambulatory Visit (INDEPENDENT_AMBULATORY_CARE_PROVIDER_SITE_OTHER)

## 2024-02-06 ENCOUNTER — Ambulatory Visit (INDEPENDENT_AMBULATORY_CARE_PROVIDER_SITE_OTHER): Admitting: Neurology

## 2024-02-06 ENCOUNTER — Ambulatory Visit (INDEPENDENT_AMBULATORY_CARE_PROVIDER_SITE_OTHER): Admitting: Gastroenterology

## 2024-02-06 ENCOUNTER — Telehealth: Payer: Self-pay | Admitting: Orthopedic Surgery

## 2024-02-06 VITALS — BP 112/78 | HR 96 | Ht 67.0 in | Wt 265.8 lb

## 2024-02-06 VITALS — BP 110/72 | HR 100 | Ht 67.0 in | Wt 267.0 lb

## 2024-02-06 DIAGNOSIS — R480 Dyslexia and alexia: Secondary | ICD-10-CM

## 2024-02-06 DIAGNOSIS — R1115 Cyclical vomiting syndrome unrelated to migraine: Secondary | ICD-10-CM

## 2024-02-06 DIAGNOSIS — K7030 Alcoholic cirrhosis of liver without ascites: Secondary | ICD-10-CM

## 2024-02-06 DIAGNOSIS — K7469 Other cirrhosis of liver: Secondary | ICD-10-CM

## 2024-02-06 DIAGNOSIS — R4189 Other symptoms and signs involving cognitive functions and awareness: Secondary | ICD-10-CM | POA: Diagnosis not present

## 2024-02-06 DIAGNOSIS — R569 Unspecified convulsions: Secondary | ICD-10-CM | POA: Diagnosis not present

## 2024-02-06 DIAGNOSIS — F1090 Alcohol use, unspecified, uncomplicated: Secondary | ICD-10-CM | POA: Diagnosis not present

## 2024-02-06 DIAGNOSIS — Z5181 Encounter for therapeutic drug level monitoring: Secondary | ICD-10-CM

## 2024-02-06 LAB — AMMONIA: Ammonia: 31 umol/L (ref 11–35)

## 2024-02-06 MED ORDER — LEVETIRACETAM 750 MG PO TABS: 750.0000 mg | ORAL_TABLET | Freq: Two times a day (BID) | ORAL | 3 refills | Status: AC

## 2024-02-06 MED ORDER — LACTULOSE 10 GM/15ML PO SOLN
ORAL | 1 refills | Status: DC
Start: 2024-02-06 — End: 2024-03-03

## 2024-02-06 NOTE — Patient Instructions (Signed)
 Continue levetiracetam  750 mg twice daily Will check a levetiracetam  level today 3-day ambulatory EEG, I will contact you to go over the result Please contact me if you do have additional seizures Referral to speech therapy for dyslexia Continue follow with PCP Return in 6 to 45-months or sooner if worse

## 2024-02-06 NOTE — Progress Notes (Unsigned)
 HPI :    Zepbound  increased; will start next Friday  Started 3 tabs  At night, will have nausea/vomiting.  Lasts 24-30 hours Low carb, high protein Weight is increasing  Regular bowel movements      Past Medical History:  Diagnosis Date   Alcohol abuse    history of heavy alcohol abuse, last drank alcohol in 06/2022   Anemia 2023   s/p iron  infusions   Anxiety    Follows with PCP. Dr. Jenkins Fredrickson.   Compression fracture of body of thoracic vertebra (HCC) 11/05/2022   T4, T5, T6 / Follows w/ orthopedic, Dr. Ozell Ada. Fractures occurred while patient was having a seizure, per patient.   Cyclic vomiting syndrome    hospital admission on 05/28/22 for vomiting, follows with gastroenterologist Dr. Glendia Holt.   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 Glendia Holt.   GERD (gastroesophageal reflux disease)    takes omeprazole  daily   Heart murmur    since childhood   Hyperlipidemia    11/20/22 lipid panel in Epic   Hypokalemia 10/15/2022   K+ level 2.5   Menorrhagia    Palpitations    Follows w/ cardiology, Damien Boos, NP. 11/14/2022 echocardiogram EF 60 65%, 12/31/22 Event heart monitor results in Epic.   PONV (postoperative nausea and vomiting)    Prolonged QT interval    Follows w/ cardiology, Damien Braver, NP.   Seizure (HCC) 06/06/2022   Follows with neurologist, Dr. Hulan Falling @ [facility]. Seizures were thought to be related to stopping alcohol. However, patient has had seizures months after quitting alcohol. As of 02/06/23, last seizure was on 11/07/22. Keppra  was then increased to bid.   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 See 11/05/22 Head CT and MRI in Epic. Resolved per pt on 02/06/23.   Tremors of nervous system    whole body tremors,  follows with Dr. Hulan Falling @ [facility].   Vitamin deficiency 2023   B12 deficiency, receiving monthly B12 injections / Vitamin D  deficiency, taking supplement every two weeks   Wears contact lenses    Wears glasses      Past Surgical History:  Procedure Laterality Date   COLONOSCOPY WITH ESOPHAGOGASTRODUODENOSCOPY (EGD)  03/27/2022   one rectal polyp   LAPAROSCOPIC APPENDECTOMY N/A 03/02/2022   Procedure: APPENDECTOMY LAPAROSCOPIC;  Surgeon: Belinda Cough, MD;  Location: [facility];  Service: General;  Laterality: N/A;   ROBOTIC ASSISTED LAPAROSCOPIC HYSTERECTOMY AND SALPINGECTOMY Bilateral 03/04/2023   Procedure: ABORTED XI ROBOTIC ASSISTED LAPAROSCOPIC HYSTERECTOMY AND SALPINGECTOMY;  Surgeon: Gorge Ade, MD;  Location: [facility] [place];  Service: Gynecology;  Laterality: Bilateral;  Requests 2/1/2 hrs.   Family History  Problem Relation Age of Onset   Breast cancer Mother    Cervical cancer Mother        ovarian   Bone cancer Mother    Breast cancer Maternal Grandmother    Colon cancer Maternal Great-grandmother 25   Esophageal cancer Neg Hx    Stomach cancer Neg Hx    Seizures Neg Hx    Stroke Neg Hx    Social History   Tobacco Use   Smoking status: Never   Smokeless tobacco: Never  Vaping Use   Vaping status: Never Used  Substance Use Topics  Alcohol use: Not Currently    Comment: History of heavy alcohol abuse. Quit in 2023. Last drank alcohol in 06/2022.   Drug use: Never   Current Outpatient Medications  Medication Sig Dispense Refill   amitriptyline  (ELAVIL ) 10 MG tablet Take 3 tablets (30 mg total) by mouth at bedtime. 180 tablet 0   busPIRone  (BUSPAR ) 10 MG tablet Take 2 tablets (20 mg total) by mouth 2 (two) times daily. 360 tablet 0   DULoxetine  (CYMBALTA ) 60 MG capsule Take 1 capsule (60 mg total) by mouth daily. 90 capsule 1   Galcanezumab -gnlm (EMGALITY ) 120 MG/ML SOAJ Inject 120 mg into the skin every 30 (thirty) days. 1.12  mL 12   hydrochlorothiazide  (HYDRODIURIL ) 25 MG tablet Take 1 tablet (25 mg total) by mouth daily as needed. 90 tablet 0   hydrOXYzine  (VISTARIL ) 25 MG capsule Take 1 capsule (25 mg total) by mouth every 6 (six) hours as needed. 360 capsule 1   KLOR-CON  M20 20 MEQ tablet Take 2 tablets (40 mEq total) by mouth 3 (three) times daily. 540 tablet 1   levETIRAcetam  (KEPPRA ) 750 MG tablet Take 1 tablet (750 mg total) by mouth 2 (two) times daily. 180 tablet 3   lidocaine  (LIDODERM ) 5 % PLACE 1 PATCH ONTO THE SKIN DAILY. REMOVE & DISCARD PATCH WITHIN 12 HOURS OR AS DIRECTED BY MD 30 patch 0   melatonin 5 MG TABS Take 5 mg by mouth. 25 mg total     metoprolol  succinate (TOPROL -XL) 25 MG 24 hr tablet TAKE 1 TABLET (25 MG TOTAL) BY MOUTH DAILY. 90 tablet 3   norethindrone  (AYGESTIN ) 5 MG tablet Take by mouth daily.     norgestimate-ethinyl estradiol (ORTHO-CYCLEN) 0.25-35 MG-MCG tablet Take 1 tablet by mouth daily.     omeprazole  (PRILOSEC) 40 MG capsule Take 1 capsule (40 mg total) by mouth daily. 90 capsule 1   ondansetron  (ZOFRAN -ODT) 4 MG disintegrating tablet TAKE 1 TABLET BY MOUTH EVERY 8 HOURS AS NEEDED FOR NAUSEA AND VOMITING 20 tablet 1   promethazine  (PHENERGAN ) 25 MG suppository Place 1 suppository (25 mg total) rectally every 6 (six) hours as needed for nausea or vomiting. 12 each 3   promethazine  (PHENERGAN ) 25 MG tablet Take 1 tablet (25 mg total) by mouth daily. 90 tablet 0   spironolactone  (ALDACTONE ) 25 MG tablet Take 1 tablet (25 mg total) by mouth daily. 90 tablet 3   tirzepatide  (ZEPBOUND ) 7.5 MG/0.5ML Pen Inject 7.5 mg into the skin once a week. 2 mL 0   Vitamin D , Ergocalciferol , (DRISDOL ) 1.25 MG (50000 UNIT) CAPS capsule Take 1 capsule (50,000 Units total) by mouth every 7 (seven) days. 12 capsule 0   baclofen (LIORESAL) 10 MG tablet Take 10 mg by mouth 3 (three) times daily. (Patient not taking: Reported on 02/06/2024)     naloxone [facility]) nasal spray 4 mg/0.1 mL SMARTSIG:Both Nares      traMADol  (ULTRAM ) 50 MG tablet Take 50 mg by mouth every 6 (six) hours as needed.     triamcinolone  cream (KENALOG ) 0.1 % Apply 1 Application topically 2 (two) times daily. (Patient taking differently: Apply 1 Application topically 2 (two) times daily. As needed) 30 g 0   No current facility-administered medications for this visit.   No Known Allergies   Review of Systems: All systems reviewed and negative except where noted in HPI.    EEG adult Result Date: 01/26/2024 Gregg Lek, MD     01/26/2024  5:02 PM History: 43 year old woman with  seizure EEG classification: Awake and drowsy  Duration: 27 minutes Technical aspects: This EEG study was done with scalp electrodes positioned according to the 10-20 International system of electrode placement. Electrical activity was reviewed with band pass filter of 1-70Hz , sensitivity of 7 uV/mm, display speed of 93mm/sec with a 60Hz  notched filter applied as appropriate. EEG data were recorded continuously and digitally stored. Description of the recording: The background rhythms of this recording consists of a fairly well modulated medium amplitude alpha rhythm of 9 Hz that is reactive to eye opening and closure. Present in the anterior head region is a 15-20 Hz beta activity. Photic stimulation was performed, did not show any abnormalities. Hyperventilation was also performed, did not show any abnormalities. Drowsiness was manifested by background fragmentation. No abnormal epileptiform discharges seen during this recording. There was no focal slowing. There were no electrographic seizure identified. Abnormality: None Impression: This is a normal awake and drowsy EEG. No evidence of interictal epileptiform discharges. Normal EEGs, however, do not rule out epilepsy. Pastor Falling, MD [facility]    Physical Exam: BP 110/72   Pulse 100   Ht 5' 7 (1.702 m)   Wt 267 lb (121.1 kg)   LMP 01/29/2023 (Approximate)   BMI 41.82 kg/m   Constitutional: Pleasant,well-developed, Caucasian female in no acute distress. HEENT: Normocephalic and atraumatic. Conjunctivae are normal. No scleral icterus. Neck supple.  Cardiovascular: Normal rate, regular rhythm.  Pulmonary/chest: Effort normal and breath sounds normal. No wheezing, rales or rhonchi. Abdominal: Soft, nondistended, nontender. Bowel sounds active throughout. There are no masses palpable. No hepatomegaly. Extremities: no edema Lymphadenopathy: No cervical adenopathy noted. Neurological: Alert and oriented to person place and time. Skin: Skin is warm and dry. No rashes noted. Psychiatric: Normal mood and affect. Behavior is normal.  CBC    Component Value Date/Time   WBC 9.6 12/23/2023 1005   RBC 4.33 12/23/2023 1005   HGB 11.9 (L) 12/23/2023 1005   HGB 12.6 09/30/2023 1111   HGB 12.9 07/16/2022 0939   HCT 37.3 12/23/2023 1005   HCT 37.6 07/16/2022 0939   PLT 332.0 12/23/2023 1005   PLT 295 09/30/2023 1111   PLT 295 07/16/2022 0939   MCV 86.0 12/23/2023 1005   MCV 102 (H) 07/16/2022 0939   MCH 29.8 10/22/2023 1129   MCHC 31.9 12/23/2023 1005   RDW 13.3 12/23/2023 1005   RDW 14.2 07/16/2022 0939   LYMPHSABS 2.5 12/23/2023 1005   MONOABS 0.4 12/23/2023 1005   EOSABS 0.2 12/23/2023 1005   BASOSABS 0.1 12/23/2023 1005    CMP     Component Value Date/Time   NA 137 12/17/2023 1416   NA 137 07/16/2022 0939   K 3.6 12/17/2023 1416   CL 105 12/17/2023 1416   CO2 23 12/17/2023 1416   GLUCOSE 152 (H) 12/17/2023 1416   BUN 10 12/17/2023 1416   BUN 12 07/16/2022 0939   CREATININE 0.67 12/17/2023 1416   CREATININE 0.69 09/30/2023 1111   CREATININE 0.53 08/18/2023 1644   CALCIUM 9.0 12/17/2023 1416   PROT 7.1 12/17/2023 1416   PROT 6.3 11/20/2022 1152   ALBUMIN 3.9 12/17/2023 1416   ALBUMIN 4.3 11/20/2022 1152   AST 116 (H) 12/17/2023 1416   AST 336 (HH) 09/30/2023 1111   ALT 118 (H) 12/17/2023 1416   ALT 659 (HH) 09/30/2023 1111   ALKPHOS 56  12/17/2023 1416   BILITOT 0.4 12/17/2023 1416   BILITOT 0.9 09/30/2023 1111   GFRNONAA >60 10/22/2023 1129   GFRNONAA >  60 09/30/2023 1111       Latest Ref Rng & Units 12/23/2023   10:05 AM 12/17/2023    2:16 PM 10/22/2023   11:29 AM  CBC EXTENDED  WBC 4.0 - 10.5 K/uL 9.6  8.2  12.6   RBC 3.87 - 5.11 Mil/uL 4.33  4.29  4.53   Hemoglobin 12.0 - 15.0 g/dL 88.0  88.0  86.4   HCT 36.0 - 46.0 % 37.3  36.8  41.8   Platelets 150.0 - 400.0 K/uL 332.0  306.0  340   NEUT# 1.4 - 7.7 K/uL 6.4  5.6  8.6   Lymph# 0.7 - 4.0 K/uL 2.5  2.0  2.8       ASSESSMENT AND PLAN:  Janice Jenkins Jansky, MD

## 2024-02-06 NOTE — Progress Notes (Signed)
 [facility]  PATIENT: Janice Arnold DOB: 1980-08-16  REQUESTING CLINICIAN: Wendolyn Jenkins Jansky, MD HISTORY FROM: Patient and husband  REASON FOR VISIT: Seizure    HISTORICAL  CHIEF COMPLAINT:  Chief Complaint  Patient presents with   Follow-up    Pt in room 12. alone.Here for seizure follow up. Pt reports only mini seizures in Feb/April. Pt said dyslexia is getting worse, feels her speech is off, pt states she feel it hard to concentrate at time.    INTERVAL HISTORY 02/06/2024: Patient presents today for follow-up, she is alone.  Last visit was in May 2024.  Since then, she tells me that she continues with Keppra  750 mg twice daily and lately she has noted concerning episodes that occurred during sleep.  She described episodes as being awake but unable to move, lasting a few minutes until she comes out of it. There are no confusion and she is aware that she is at home in her bed.  She is also reporting episodes of staring spells, and worsening dyslexia.  Denies any generalized seizures but report mini seizure that she described as generalized shaking 1 in February and 1 in April.  She reports that her husband was recently diagnosed with Warnicke encephalopathy in December and this is a source of big stress.   INTERVAL HISTORY 01/09/2023:  Patient presents today for follow-up, last visit was in November; at that time we discontinued Keppra  because we thought the seizures were alcohol withdrawal seizures.  Since then she reported having another seizure in March, husband described as a generalized tonic-clonic seizure out of sleep.  The seizure was so severe that she broke her back.  Since then we have restarted her on Keppra  and she has not had any generalized seizure but she continued to have jerks and episodes where she freezes and unable to move.  She described these episodes as small seizures.  She is compliant with the Keppra  500 twice daily, denies any side effect of the  medication.   HISTORY OF PRESENT ILLNESS:  This is a 43 year old woman past medical history of alcohol abuse, fatty liver, anxiety who is presenting after being admitted to the hospital on October 26 for seizures.  Per husband, it was early in the morning when he noted that patient leg was very stiff, and she was unresponsive.  When he tried to wake her up he noted that her eyes were rolled back, she was making grunting noise and had a generalized tonic-clonic event.  EMS was called.  He reported the seizure lasted about a minute but patient was confused for about 30 minutes.  Husband reported in the ED she has a second seizure.  Per patient, she does have a history of alcohol abuse but her last drink was in June.  She reports stopping alcohol cold [place] and did not have any withdrawal symptoms.  She reported that night of the seizure she went out with her husband and has a had a couple of seltzer drinks, very little alcohol.  She denies and she was adamant that she is not drinking 1/5 of liquor every day.  She denies any previous history of seizures, denies any history of fall or head trauma in the weeks prior to the seizure. she reports bumping her head 3 years ago but nothing recent. Since leaving the hospital she feels a little anxious and jittery.  She is on Ativan  but feels like her tremors are still present.  She reported she has not  had any additional drink since the 26.  Denies any seizure or seizure-like activity. Feels like she has brain fog and mild dizziness. She is compliant with the Keppra  500 mg BID.    Handedness: Right handed   Onset: Jun 06 2022  Seizure Type: Generalized   Current frequency: Twice now  Any injuries from seizures: Denies   Seizure risk factors: Alcohol use, Subdural hematoma   Previous ASMs: None   Currenty ASMs: Levetiracetam    ASMs side effects: Denies   Brain Images: Subdural hematoma   Previous EEGs: Normal    OTHER MEDICAL CONDITIONS: History  of alcohol abuse, anxiety,   REVIEW OF SYSTEMS: Full 14 system review of systems performed and negative with exception of: As noted in the HPI   ALLERGIES: No Known Allergies  HOME MEDICATIONS: Outpatient Medications Prior to Visit  Medication Sig Dispense Refill   amitriptyline  (ELAVIL ) 10 MG tablet Take 3 tablets (30 mg total) by mouth at bedtime. 180 tablet 0   busPIRone  (BUSPAR ) 10 MG tablet Take 2 tablets (20 mg total) by mouth 2 (two) times daily. 360 tablet 0   DULoxetine  (CYMBALTA ) 60 MG capsule Take 1 capsule (60 mg total) by mouth daily. 90 capsule 1   Galcanezumab -gnlm (EMGALITY ) 120 MG/ML SOAJ Inject 120 mg into the skin every 30 (thirty) days. 1.12 mL 12   hydrochlorothiazide  (HYDRODIURIL ) 25 MG tablet Take 1 tablet (25 mg total) by mouth daily as needed. 90 tablet 0   hydrOXYzine  (VISTARIL ) 25 MG capsule Take 1 capsule (25 mg total) by mouth every 6 (six) hours as needed. 360 capsule 1   KLOR-CON  M20 20 MEQ tablet Take 2 tablets (40 mEq total) by mouth 3 (three) times daily. 540 tablet 1   lidocaine  (LIDODERM ) 5 % PLACE 1 PATCH ONTO THE SKIN DAILY. REMOVE & DISCARD PATCH WITHIN 12 HOURS OR AS DIRECTED BY MD 30 patch 0   melatonin 5 MG TABS Take 5 mg by mouth. 25 mg total     metoprolol  succinate (TOPROL -XL) 25 MG 24 hr tablet TAKE 1 TABLET (25 MG TOTAL) BY MOUTH DAILY. 90 tablet 3   naloxone (NARCAN) nasal spray 4 mg/0.1 mL SMARTSIG:Both Nares     norethindrone  (AYGESTIN ) 5 MG tablet Take by mouth daily.     norgestimate-ethinyl estradiol (ORTHO-CYCLEN) 0.25-35 MG-MCG tablet Take 1 tablet by mouth daily.     omeprazole  (PRILOSEC) 40 MG capsule Take 1 capsule (40 mg total) by mouth daily. 90 capsule 1   ondansetron  (ZOFRAN -ODT) 4 MG disintegrating tablet TAKE 1 TABLET BY MOUTH EVERY 8 HOURS AS NEEDED FOR NAUSEA AND VOMITING 20 tablet 1   promethazine  (PHENERGAN ) 25 MG suppository Place 1 suppository (25 mg total) rectally every 6 (six) hours as needed for nausea or vomiting. 12  each 3   promethazine  (PHENERGAN ) 25 MG tablet Take 1 tablet (25 mg total) by mouth daily. 90 tablet 0   spironolactone  (ALDACTONE ) 25 MG tablet Take 1 tablet (25 mg total) by mouth daily. 90 tablet 3   tirzepatide  (ZEPBOUND ) 7.5 MG/0.5ML Pen Inject 7.5 mg into the skin once a week. 2 mL 0   triamcinolone  cream (KENALOG ) 0.1 % Apply 1 Application topically 2 (two) times daily. (Patient taking differently: Apply 1 Application topically 2 (two) times daily. As needed) 30 g 0   Vitamin D , Ergocalciferol , (DRISDOL ) 1.25 MG (50000 UNIT) CAPS capsule Take 1 capsule (50,000 Units total) by mouth every 7 (seven) days. 12 capsule 0   levETIRAcetam  (KEPPRA ) 750 MG tablet TAKE  1 TABLET BY MOUTH 2 TIMES DAILY. 180 tablet 0   baclofen (LIORESAL) 10 MG tablet Take 10 mg by mouth 3 (three) times daily. (Patient not taking: Reported on 02/06/2024)     traMADol  (ULTRAM ) 50 MG tablet Take 50 mg by mouth every 6 (six) hours as needed.     celecoxib (CELEBREX) 100 MG capsule Take 100 mg by mouth 2 (two) times daily. (Patient not taking: Reported on 12/17/2023)     No facility-administered medications prior to visit.    PAST MEDICAL HISTORY: Past Medical History:  Diagnosis Date   Alcohol abuse    history of heavy alcohol abuse, last drank alcohol in 06/2022   Anemia 2023   s/p iron  infusions   Anxiety    Follows with PCP. Dr. Jenkins Fredrickson.   Compression fracture of body of thoracic vertebra (HCC) 11/05/2022   T4, T5, T6 / Follows w/ orthopedic, Dr. Ozell Ada. Fractures occurred while patient was having a seizure, per patient.   Cyclic vomiting syndrome    hospital admission on 05/28/22 for vomiting, follows with gastroenterologist Dr. Glendia Holt.   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 Glendia Holt.   GERD (gastroesophageal reflux disease)    takes omeprazole  daily    Heart murmur    since childhood   Hyperlipidemia    11/20/22 lipid panel in Epic   Hypokalemia 10/15/2022   K+ level 2.5   Menorrhagia    Palpitations    Follows w/ cardiology, Damien Boos, NP. 11/14/2022 echocardiogram EF 60 65%, 12/31/22 Event heart monitor results in Epic.   PONV (postoperative nausea and vomiting)    Prolonged QT interval    Follows w/ cardiology, Damien Braver, NP.   Seizure (HCC) 06/06/2022   Follows with neurologist, Dr. Hulan Falling @ [facility]. Seizures were thought to be related to stopping alcohol. However, patient has had seizures months after quitting alcohol. As of 02/06/23, last seizure was on 11/07/22. Keppra  was then increased to bid.   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 See 11/05/22 Head CT and MRI in Epic. Resolved per pt on 02/06/23.   Tremors of nervous system    whole body tremors, follows with Dr. Hulan Falling @ [facility].   Vitamin deficiency 2023   B12 deficiency, receiving monthly B12 injections / Vitamin D  deficiency, taking supplement every two weeks   Wears contact lenses    Wears glasses     PAST SURGICAL HISTORY: Past Surgical History:  Procedure Laterality Date   COLONOSCOPY WITH ESOPHAGOGASTRODUODENOSCOPY (EGD)  03/27/2022   one rectal polyp   LAPAROSCOPIC APPENDECTOMY N/A 03/02/2022   Procedure: APPENDECTOMY LAPAROSCOPIC;  Surgeon: Belinda Cough, MD;  Location: [facility];  Service: General;  Laterality: N/A;   ROBOTIC ASSISTED LAPAROSCOPIC HYSTERECTOMY AND SALPINGECTOMY Bilateral 03/04/2023   Procedure: ABORTED XI ROBOTIC ASSISTED LAPAROSCOPIC HYSTERECTOMY AND SALPINGECTOMY;  Surgeon: Gorge Ade, MD;  Location: [facility];  Service: Gynecology;  Laterality: Bilateral;  Requests 2/1/2 hrs.    FAMILY HISTORY: Family History  Problem Relation Age of Onset   Breast cancer Mother    Cervical cancer Mother        ovarian    Bone cancer Mother    Breast cancer Maternal Grandmother    Colon cancer Maternal Great-grandmother 64  Esophageal cancer Neg Hx    Stomach cancer Neg Hx    Seizures Neg Hx    Stroke Neg Hx     SOCIAL HISTORY: Social History   Socioeconomic History   Marital status: Married    Spouse name: Not on file   Number of children: 3   Years of education: Not on file   Highest education level: Associate degree: occupational, Scientist, product/process development, or vocational program  Occupational History   Occupation: Emergency planning/management officer  Tobacco Use   Smoking status: Never   Smokeless tobacco: Never  Vaping Use   Vaping status: Never Used  Substance and Sexual Activity   Alcohol use: Not Currently    Comment: History of heavy alcohol abuse. Quit in 2023. Last drank alcohol in 06/2022.   Drug use: Never   Sexual activity: Yes    Birth control/protection: Pill  Other Topics Concern   Not on file  Social History Narrative   Project mgr   Social Drivers of Health   Financial Resource Strain: High Risk (09/05/2023)   Overall Financial Resource Strain (CARDIA)    Difficulty of Paying Living Expenses: Hard  Food Insecurity: Patient Declined (09/05/2023)   Hunger Vital Sign    Worried About Running Out of Food in the Last Year: Patient declined    Ran Out of Food in the Last Year: Patient declined  Transportation Needs: Unmet Transportation Needs (09/05/2023)   PRAPARE - Administrator, Civil Service (Medical): Yes    Lack of Transportation (Non-Medical): Patient declined  Physical Activity: Unknown (09/05/2023)   Exercise Vital Sign    Days of Exercise per Week: 0 days    Minutes of Exercise per Session: Not on file  Stress: Stress Concern Present (09/05/2023)   [employer] of Occupational Health - Occupational Stress Questionnaire    Feeling of Stress : Very much  Social Connections: Moderately Isolated (09/05/2023)   Social Connection and Isolation Panel    Frequency of Communication with  Friends and Family: Three times a week    Frequency of Social Gatherings with Friends and Family: Patient declined    Attends Religious Services: Never    Database administrator or Organizations: No    Attends Engineer, structural: Not on file    Marital Status: Married  Catering manager Violence: Not At Risk (06/07/2022)   Humiliation, Afraid, Rape, and Kick questionnaire    Fear of Current or Ex-Partner: No    Emotionally Abused: No    Physically Abused: No    Sexually Abused: No    PHYSICAL EXAM  GENERAL EXAM/CONSTITUTIONAL: Vitals:  Vitals:   02/06/24 0957  BP: 112/78  Pulse: 96  Weight: 265 lb 12.8 oz (120.6 kg)  Height: 5' 7 (1.702 m)   Body mass index is 41.63 kg/m. Wt Readings from Last 3 Encounters:  02/06/24 267 lb (121.1 kg)  02/06/24 265 lb 12.8 oz (120.6 kg)  12/17/23 255 lb 6 oz (115.8 kg)   Patient is in no distress; well developed, nourished and groomed; neck is supple. She appears anxious and tremolous  MUSCULOSKELETAL: Gait, strength, tone, movements noted in Neurologic exam below  NEUROLOGIC: MENTAL STATUS:      No data to display         awake, alert, oriented to person, place and time recent and remote memory intact normal attention and concentration language fluent, comprehension intact, naming intact fund of knowledge appropriate  CRANIAL NERVE:  2nd, 3rd, 4th, 6th - Visual fields full  to confrontation, extraocular muscles intact, no nystagmus 5th - facial sensation symmetric 7th - facial strength symmetric 8th - hearing intact 9th - palate elevates symmetrically, uvula midline 11th - shoulder shrug symmetric 12th - tongue protrusion midline  MOTOR:  normal bulk and tone, full strength in the BUE, BLE. Mild tremors noted on exam   SENSORY:  normal and symmetric to light touch  COORDINATION:  finger-nose-finger, fine finger movements normal  GAIT/STATION:  normal   DIAGNOSTIC DATA (LABS, IMAGING, TESTING) - I  reviewed patient records, labs, notes, testing and imaging myself where available.  Lab Results  Component Value Date   WBC 9.6 12/23/2023   HGB 11.9 (L) 12/23/2023   HCT 37.3 12/23/2023   MCV 86.0 12/23/2023   PLT 332.0 12/23/2023      Component Value Date/Time   NA 137 12/17/2023 1416   NA 137 07/16/2022 0939   K 3.6 12/17/2023 1416   CL 105 12/17/2023 1416   CO2 23 12/17/2023 1416   GLUCOSE 152 (H) 12/17/2023 1416   BUN 10 12/17/2023 1416   BUN 12 07/16/2022 0939   CREATININE 0.67 12/17/2023 1416   CREATININE 0.69 09/30/2023 1111   CREATININE 0.53 08/18/2023 1644   CALCIUM 9.0 12/17/2023 1416   PROT 7.1 12/17/2023 1416   PROT 6.3 11/20/2022 1152   ALBUMIN 3.9 12/17/2023 1416   ALBUMIN 4.3 11/20/2022 1152   AST 116 (H) 12/17/2023 1416   AST 336 (HH) 09/30/2023 1111   ALT 118 (H) 12/17/2023 1416   ALT 659 (HH) 09/30/2023 1111   ALKPHOS 56 12/17/2023 1416   BILITOT 0.4 12/17/2023 1416   BILITOT 0.9 09/30/2023 1111   GFRNONAA >60 10/22/2023 1129   GFRNONAA >60 09/30/2023 1111   Lab Results  Component Value Date   CHOL 172 09/17/2023   HDL 32.40 (L) 09/17/2023   LDLCALC 104 (H) 09/17/2023   TRIG 178.0 (H) 09/17/2023   Lab Results  Component Value Date   HGBA1C <4.2 10/15/2022   Lab Results  Component Value Date   VITAMINB12 377 09/30/2023   Lab Results  Component Value Date   TSH 3.71 07/02/2023   Head CT 06/06/22 1. 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 No acute intracranial hemorrhage  Routine EEG 06/06/22 - Excessive beta, generalized IMPRESSION: This technically difficult study is within normal limits. The excessive beta activity seen in the background is most likely due to the effect of benzodiazepine and is a benign EEG pattern. No seizures or epileptiform discharges were seen throughout the recording. A normal interictal EEG does  not exclude the diagnosis of epilepsy   Routine EEG 01/26/2024:  Normal    I personally reviewed brain Images and previous EEG reports.   ASSESSMENT AND PLAN  43 y.o. year old female  with with history of alcohol abuse and fatty liver who is presenting for follow-up for her seizures.  She is currently on levetiracetam  750 mg twice daily, denies any generalized convulsion but report episode of generalized shaking, also episodes that occurred during sleep where she is awake and unable to move.  I have informed patient that the nocturnal events sound like sleep paralysis, but we can get an ambulatory EEG to rule out seizures.  For now we will continue on Keppra  750 mg twice daily, I will check a Keppra  level and if ambulatory EEG is abnormal, we will likely increase the dose.  For her worsening dyslexia,  I will refer patient to speech therapy. Continue to follow with PCP and return in 6 to 8 months or sooner if worse.   1. Dyslexia   2. Brain fog   3. Seizures (HCC)   4. Therapeutic drug monitoring     Patient Instructions  Continue levetiracetam  750 mg twice daily Will check a levetiracetam  level today 3-day ambulatory EEG, I will contact you to go over the result Please contact me if you do have additional seizures Referral to speech therapy for dyslexia Continue follow with PCP Return in 6 to 71-months or sooner if worse   Per [place]  DMV statutes, patients with seizures are not allowed to drive until they have been seizure-free for six months.  Other recommendations include using caution when using heavy equipment or power tools. Avoid working on ladders or at heights. Take showers instead of baths.  Do not swim alone.  Ensure the water  temperature is not too high on the home water  heater. Do not go swimming alone. Do not lock yourself in a room alone (i.e. bathroom). When caring for infants or small children, sit down when holding, feeding, or changing them to minimize risk of  injury to the child in the event you have a seizure. Maintain good sleep hygiene. Avoid alcohol.  Also recommend adequate sleep, hydration, good diet and minimize stress.   During the Seizure  - First, ensure adequate ventilation and place patients on the floor on their left side  Loosen clothing around the neck and ensure the airway is patent. If the patient is clenching the teeth, do not force the mouth open with any object as this can cause severe damage - Remove all items from the surrounding that can be hazardous. The patient may be oblivious to what's happening and may not even know what he or she is doing. If the patient is confused and wandering, either gently guide him/her away and block access to outside areas - Reassure the individual and be comforting - Call 911. In most cases, the seizure ends before EMS arrives. However, there are cases when seizures may last over 3 to 5 minutes. Or the individual may have developed breathing difficulties or severe injuries. If a pregnant patient or a person with diabetes develops a seizure, it is prudent to call an ambulance. - Finally, if the patient does not regain full consciousness, then call EMS. Most patients will remain confused for about 45 to 90 minutes after a seizure, so you must use judgment in calling for help. - Avoid restraints but make sure the patient is in a bed with padded side rails - Place the individual in a lateral position with the neck slightly flexed; this will help the saliva drain from the mouth and prevent the tongue from falling backward - Remove all nearby furniture and other hazards from the area - Provide verbal assurance as the individual is regaining consciousness - Provide the patient with privacy if possible - Call for help and start treatment as ordered by the caregiver   After the Seizure (Postictal Stage)  After a seizure, most patients experience confusion, fatigue, muscle pain and/or a headache. Thus, one  should permit the individual to sleep. For the next few days, reassurance is essential. Being calm and helping reorient the person is also of importance.  Most seizures are painless and end spontaneously. Seizures are not harmful to others but can lead to complications such as stress on the lungs, brain and the heart. Individuals with prior  lung problems may develop labored breathing and respiratory distress.     Orders Placed This Encounter  Procedures   Levetiracetam  level   Ambulatory referral to Speech Therapy   AMBULATORY EEG    Meds ordered this encounter  Medications   levETIRAcetam  (KEPPRA ) 750 MG tablet    Sig: Take 1 tablet (750 mg total) by mouth 2 (two) times daily.    Dispense:  180 tablet    Refill:  3    Return in about 8 months (around 10/08/2024).   Pastor Falling, MD 02/06/2024, 3:05 PM  [facility] [address], [address] [place], [place] [address] [phone]

## 2024-02-06 NOTE — Telephone Encounter (Signed)
 Patient called and wanted to know why she is discharge from the practice because she didn't cancel two of the appointments.  CB#[phone]

## 2024-02-06 NOTE — Patient Instructions (Signed)
 Your provider has requested that you go to the basement level for lab work before leaving today. Press B on the elevator. The lab is located at the first door on the left as you exit the elevator.  Due to recent changes in healthcare laws, you may see the results of your imaging and laboratory studies on MyChart before your provider has had a chance to review them.  We understand that in some cases there may be results that are confusing or concerning to you. Not all laboratory results come back in the same time frame and the provider may be waiting for multiple results in order to interpret others.  Please give us  48 hours in order for your provider to thoroughly review all the results before contacting the office for clarification of your results.   We have sent the following medications to your pharmacy for you to pick up at your convenience: Lactulose   _______________________________________________________  If your blood pressure at your visit was 140/90 or greater, please contact your primary care physician to follow up on this.  _______________________________________________________  If you are age 17 or older, your body mass index should be between 23-30. Your Body mass index is 41.82 kg/m. If this is out of the aforementioned range listed, please consider follow up with your Primary Care Provider.  If you are age 6 or younger, your body mass index should be between 19-25. Your Body mass index is 41.82 kg/m. If this is out of the aformentioned range listed, please consider follow up with your Primary Care Provider.   ________________________________________________________  The [facility] providers would like to encourage you to use MYCHART to communicate with providers for non-urgent requests or questions.  Due to long hold times on the telephone, sending your provider a message by [facility] may be a faster and more efficient way to get a response.  Please allow 48 business hours for  a response.  Please remember that this is for non-urgent requests.  _______________________________________________________  Thank you for choosing me and [facility].

## 2024-02-07 LAB — LEVETIRACETAM LEVEL: Levetiracetam Lvl: 15.2 ug/mL (ref 10.0–40.0)

## 2024-02-08 ENCOUNTER — Other Ambulatory Visit: Payer: Self-pay | Admitting: Orthopedic Surgery

## 2024-02-09 ENCOUNTER — Ambulatory Visit: Payer: Self-pay | Admitting: Neurology

## 2024-02-09 ENCOUNTER — Telehealth: Payer: Self-pay | Admitting: Orthopedic Surgery

## 2024-02-09 NOTE — Telephone Encounter (Signed)
 Pt called requesting a call back concerning pt dismissed as a pt. Pt states she did not cancel her appts. She states she really need a call concerning this matter. I expressed to pt that I would send to our Administrator. Sari please call pt per Christy/Moore. Pt phone number is [phone].

## 2024-02-10 ENCOUNTER — Encounter: Payer: Self-pay | Admitting: Family Medicine

## 2024-02-10 ENCOUNTER — Telehealth: Payer: Self-pay

## 2024-02-10 ENCOUNTER — Ambulatory Visit (INDEPENDENT_AMBULATORY_CARE_PROVIDER_SITE_OTHER): Admitting: Family Medicine

## 2024-02-10 ENCOUNTER — Encounter: Payer: Self-pay | Admitting: Gastroenterology

## 2024-02-10 VITALS — BP 93/67 | HR 85 | Temp 98.6°F | Resp 18 | Ht 67.0 in | Wt 264.2 lb

## 2024-02-10 DIAGNOSIS — F4323 Adjustment disorder with mixed anxiety and depressed mood: Secondary | ICD-10-CM | POA: Diagnosis not present

## 2024-02-10 DIAGNOSIS — K760 Fatty (change of) liver, not elsewhere classified: Secondary | ICD-10-CM

## 2024-02-10 DIAGNOSIS — E538 Deficiency of other specified B group vitamins: Secondary | ICD-10-CM | POA: Diagnosis not present

## 2024-02-10 DIAGNOSIS — R600 Localized edema: Secondary | ICD-10-CM

## 2024-02-10 DIAGNOSIS — N939 Abnormal uterine and vaginal bleeding, unspecified: Secondary | ICD-10-CM

## 2024-02-10 DIAGNOSIS — R569 Unspecified convulsions: Secondary | ICD-10-CM

## 2024-02-10 DIAGNOSIS — Z79899 Other long term (current) drug therapy: Secondary | ICD-10-CM | POA: Diagnosis not present

## 2024-02-10 MED ORDER — CYANOCOBALAMIN 1000 MCG/ML IJ SOLN
1000.0000 ug | Freq: Once | INTRAMUSCULAR | Status: AC
  Administered 2024-02-10: 1000 ug via INTRAMUSCULAR

## 2024-02-10 MED ORDER — NORETHINDRONE ACETATE 5 MG PO TABS: 5.0000 mg | ORAL_TABLET | Freq: Two times a day (BID) | ORAL | 2 refills | Status: DC

## 2024-02-10 NOTE — Telephone Encounter (Signed)
 SABRA

## 2024-02-10 NOTE — Patient Instructions (Signed)
 It was very nice to see you today!  Refer to psych Call medicaid for gyn.    PLEASE NOTE:  If you had any lab tests please let us  know if you have not heard back within a few days. You may see your results on MyChart before we have a chance to review them but we will give you a call once they are reviewed by us . If we ordered any referrals today, please let us  know if you have not heard from their office within the next week.   Please try these tips to maintain a healthy lifestyle:  Eat most of your calories during the day when you are active. Eliminate processed foods including packaged sweets (pies, cakes, cookies), reduce intake of potatoes, white bread, white pasta, and white rice. Look for whole grain options, oat flour or almond flour.  Each meal should contain half fruits/vegetables, one quarter protein, and one quarter carbs (no bigger than a computer mouse).  Cut down on sweet beverages. This includes juice, soda, and sweet tea. Also watch fruit intake, though this is a healthier sweet option, it still contains natural sugar! Limit to 3 servings daily.  Drink at least 1 glass of water  with each meal and aim for at least 8 glasses per day  Exercise at least 150 minutes every week.

## 2024-02-10 NOTE — Progress Notes (Signed)
 Subjective:     Patient ID: Janice Arnold, female    DOB: January 21, 1981, 43 y.o.   MRN: [id number]  Chief Complaint  Patient presents with   Medical Management of Chronic Issues    4 week follow-up Fasting      HPI Discussed the use of AI scribe software for clinical note transcription with the patient, who gave verbal consent to proceed.  History of Present Illness Janice Arnold is a 43 year old female with fatty liver and cirrhosis who presents for follow-up on her liver condition.  She is uncertain about the severity of her liver condition. She has abstained from alcohol for over a year. A test in December showed abnormal results, which have since normalized. She is awaiting results from a recent test and is taking lactulose to help with brain fog-hasn't started yet.  Managed by GI.  She engages in physical activity, primarily walking for up to thirty minutes a day, sometimes splitting it into two sessions if she is having a bad day. Exercise helps manage stress, which has been high at home recently. She has previously lost significant weight by being active and avoiding certain foods like pasta, but has gained weight since stopping alcohol.   She is currently taking potassium supplements, two pills three times a day, and is on hydrochlorothiazide  and spironolactone  for diuretic purposes-if doesn't take, edema worse.  She experiences mood swings and increased stress, particularly with her husband. She is on duloxetine , Buspar , and hydroxyzine  for mood stabilization, taking Buspar  20 mg twice a day and hydroxyzine  as needed, sometimes three tablets at a time during high anxiety. She is concerned about the impact of stress and medication on her mental health.  She has a history of epilepsy and is on Keppra , with recent EEG results showing no epilepsy. She experiences sleep paralysis and brain fog, with difficulty focusing and reading. She is concerned about these symptoms worsening  and their impact on her daily life.  She has a dental issue requiring significant work over the next few months. She is also managing her reproductive health with Aygestin  and orthocycline, and is seeking a new gynecologist and cardiologist due to her previous doctors retiring.    Health Maintenance Due  Topic Date Due   Cervical Cancer Screening (HPV/Pap Cotest)  Never done   MAMMOGRAM  04/29/2020    Past Medical History:  Diagnosis Date   Alcohol abuse    history of heavy alcohol abuse, last drank alcohol in 06/2022   Anemia 2023   s/p iron  infusions   Anxiety    Follows with PCP. Dr. Jenkins Fredrickson.   Chronic kidney disease 8/23   Clotting disorder (HCC) 03/12/22   Vaginal bleeding/very heavy periods   Compression fracture of body of thoracic vertebra (HCC) 11/05/2022   T4, T5, T6 / Follows w/ orthopedic, Dr. Ozell Ada. Fractures occurred while patient was having a seizure, per patient.   Cyclic vomiting syndrome    hospital admission on 05/28/22 for vomiting, follows with gastroenterologist Dr. Glendia Holt.   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 Glendia Holt.   GERD (gastroesophageal reflux disease)    takes omeprazole  daily   Heart murmur 1982   since childhood   Hyperlipidemia    11/20/22 lipid panel in Epic   Hypokalemia 10/15/2022   K+ level 2.5  Menorrhagia    Palpitations    Follows w/ cardiology, Damien Boos, NP. 11/14/2022 echocardiogram EF 60 65%, 12/31/22 Event heart monitor results in Epic.   PONV (postoperative nausea and vomiting)    Prolonged QT interval    Follows w/ cardiology, Damien Braver, NP.   Seizure (HCC) 06/06/2022   Follows with neurologist, Dr. Hulan Falling @ [facility]. Seizures were thought to be related to stopping alcohol. However, patient has had seizures months after quitting alcohol. As of  02/06/23, last seizure was on 11/07/22. Keppra  was then increased to bid.   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 See 11/05/22 Head CT and MRI in Epic. Resolved per pt on 02/06/23.   Tremors of nervous system    whole body tremors, follows with Dr. Hulan Falling @ [facility].   Vitamin deficiency 2023   B12 deficiency, receiving monthly B12 injections / Vitamin D  deficiency, taking supplement every two weeks   Wears contact lenses    Wears glasses     Past Surgical History:  Procedure Laterality Date   COLONOSCOPY WITH ESOPHAGOGASTRODUODENOSCOPY (EGD)  03/27/2022   one rectal polyp   LAPAROSCOPIC APPENDECTOMY N/A 03/02/2022   Procedure: APPENDECTOMY LAPAROSCOPIC;  Surgeon: Belinda Cough, MD;  Location: [facility];  Service: General;  Laterality: N/A;   ROBOTIC ASSISTED LAPAROSCOPIC HYSTERECTOMY AND SALPINGECTOMY Bilateral 03/04/2023   Procedure: ABORTED XI ROBOTIC ASSISTED LAPAROSCOPIC HYSTERECTOMY AND SALPINGECTOMY;  Surgeon: Gorge Ade, MD;  Location: [facility];  Service: Gynecology;  Laterality: Bilateral;  Requests 2/1/2 hrs.     Current Outpatient Medications:    acetaminophen -codeine (TYLENOL  #3) 300-30 MG tablet, Take 1 tablet by mouth 3 (three) times daily as needed., Disp: , Rfl:    amitriptyline  (ELAVIL ) 10 MG tablet, Take 3 tablets (30 mg total) by mouth at bedtime., Disp: 180 tablet, Rfl: 0   amoxicillin (AMOXIL) 500 MG capsule, Take 500 mg by mouth 3 (three) times daily., Disp: , Rfl:    busPIRone  (BUSPAR ) 10 MG tablet, Take 2 tablets (20 mg total) by mouth 2 (two) times daily., Disp: 360 tablet, Rfl: 0   DULoxetine  (CYMBALTA ) 60 MG capsule, Take 1 capsule (60 mg total) by mouth daily., Disp: 90 capsule, Rfl: 1   gabapentin (NEURONTIN) 300 MG capsule, Take 300 mg by mouth at bedtime., Disp: , Rfl:    Galcanezumab -gnlm (EMGALITY ) 120 MG/ML SOAJ, Inject 120 mg into the skin every 30  (thirty) days., Disp: 1.12 mL, Rfl: 12   hydrochlorothiazide  (HYDRODIURIL ) 25 MG tablet, Take 1 tablet (25 mg total) by mouth daily as needed., Disp: 90 tablet, Rfl: 0   HYDROcodone -acetaminophen  (NORCO/VICODIN) 5-325 MG tablet, Take 1 tablet by mouth every 6 (six) hours as needed., Disp: , Rfl:    hydrOXYzine  (VISTARIL ) 25 MG capsule, Take 1 capsule (25 mg total) by mouth every 6 (six) hours as needed., Disp: 360 capsule, Rfl: 1   ibuprofen  (ADVIL ) 800 MG tablet, Take by mouth., Disp: , Rfl:    KLOR-CON  M20 20 MEQ tablet, Take 2 tablets (40 mEq total) by mouth 3 (three) times daily., Disp: 540 tablet, Rfl: 1   lactulose (CHRONULAC) 10 GM/15ML solution, Take 15ml three times daily prn, Disp: 1800 mL, Rfl: 1   levETIRAcetam  (KEPPRA ) 750 MG tablet, Take 1 tablet (750 mg total) by mouth 2 (two) times daily., Disp: 180 tablet, Rfl: 3   lidocaine  (LIDODERM ) 5 %, PLACE 1 PATCH ONTO THE SKIN DAILY. REMOVE &  DISCARD PATCH WITHIN 12 HOURS OR AS DIRECTED BY MD, Disp: 30 patch, Rfl: 0   melatonin 5 MG TABS, Take 5 mg by mouth. 25 mg total, Disp: , Rfl:    meloxicam (MOBIC) 15 MG tablet, Take 15 mg by mouth daily., Disp: , Rfl:    metoprolol  succinate (TOPROL -XL) 25 MG 24 hr tablet, TAKE 1 TABLET (25 MG TOTAL) BY MOUTH DAILY., Disp: 90 tablet, Rfl: 3   norgestimate-ethinyl estradiol (ORTHO-CYCLEN) 0.25-35 MG-MCG tablet, Take 1 tablet by mouth daily., Disp: , Rfl:    omeprazole  (PRILOSEC) 40 MG capsule, Take 1 capsule (40 mg total) by mouth daily., Disp: 90 capsule, Rfl: 1   ondansetron  (ZOFRAN -ODT) 4 MG disintegrating tablet, TAKE 1 TABLET BY MOUTH EVERY 8 HOURS AS NEEDED FOR NAUSEA AND VOMITING, Disp: 20 tablet, Rfl: 1   promethazine  (PHENERGAN ) 25 MG suppository, Place 1 suppository (25 mg total) rectally every 6 (six) hours as needed for nausea or vomiting., Disp: 12 each, Rfl: 3   promethazine  (PHENERGAN ) 25 MG tablet, Take 1 tablet (25 mg total) by mouth daily., Disp: 90 tablet, Rfl: 0   spironolactone   (ALDACTONE ) 25 MG tablet, Take 1 tablet (25 mg total) by mouth daily., Disp: 90 tablet, Rfl: 3   tirzepatide  (ZEPBOUND ) 7.5 MG/0.5ML Pen, Inject 7.5 mg into the skin once a week., Disp: 2 mL, Rfl: 0   tiZANidine (ZANAFLEX) 4 MG capsule, , Disp: , Rfl:    Vitamin D , Ergocalciferol , (DRISDOL ) 1.25 MG (50000 UNIT) CAPS capsule, Take 1 capsule (50,000 Units total) by mouth every 7 (seven) days., Disp: 12 capsule, Rfl: 0   norethindrone  (AYGESTIN ) 5 MG tablet, Take 1 tablet (5 mg total) by mouth in the morning and at bedtime., Disp: 60 tablet, Rfl: 2  No Known Allergies ROS neg/noncontributory except as noted HPI/below      Objective:     BP 93/67   Pulse 85   Temp 98.6 F (37 C) (Temporal)   Resp 18   Ht 5' 7 (1.702 m)   Wt 264 lb 4 oz (119.9 kg)   LMP 01/29/2023 (Approximate)   SpO2 97%   BMI 41.39 kg/m  Wt Readings from Last 3 Encounters:  02/10/24 264 lb 4 oz (119.9 kg)  02/06/24 267 lb (121.1 kg)  02/06/24 265 lb 12.8 oz (120.6 kg)    Physical Exam   Gen: WDWN NAD HEENT: NCAT, conjunctiva not injected, sclera nonicteric NECK:  supple, no thyromegaly, no nodes, no carotid bruits CARDIAC: RRR, S1S2+, no murmur. DP 2+B LUNGS: CTAB. No wheezes ABDOMEN:  BS+, soft, NTND, No HSM, no masses EXT:  no edema MSK: no gross abnormalities.  NEURO: A&O x3.  CN II-XII intact.  PSYCH: normal mood. Good eye contact  w/pt reviewing notes, consults, labs, mult issues, plan     Assessment & Plan:  Metabolic dysfunction-associated steatotic liver disease (MASLD) -     CBC with Differential/Platelet; Future -     Comprehensive metabolic panel with GFR; Future -     Hemoglobin A1c; Future  Local edema  Seizure (HCC)  B12 deficiency -     Cyanocobalamin   Adjustment disorder with mixed anxiety and depressed mood -     Ambulatory referral to Psychiatry  Abnormal uterine bleeding  High risk medication use -     CBC with Differential/Platelet; Future -     Comprehensive  metabolic panel with GFR; Future -     Magnesium ; Future -     Hemoglobin A1c; Future  Other orders -  Norethindrone  Acetate; Take 1 tablet (5 mg total) by mouth in the morning and at bedtime.  Dispense: 60 tablet; Refill: 2  Assessment and Plan Assessment & Plan Fatty Liver Disease with Cirrhosis   Diagnosed with fatty liver disease and cirrhosis, confirmed by liver biopsy. She has abstained from alcohol for over a year, which is crucial for management. Experiencing brain fog related to hepatic encephalopathy, GI just added lactulose. There are concerns about potassium levels due to lactulose and hydrochlorothiazide . Continue alcohol abstinence and start lactulose to achieve bowel movements three times daily. Monitor potassium levels and follow up with a gastroenterologist. Schedule a follow-up in one week for blood work, including potassium levels.  Obesity   Weight reduced from 400 pounds to the 200s, but recent weight gain noted. Plans to start Zepbound  for weight management. Increasing physical activity and managing diet to support weight loss, previously achieved by increasing activity and avoiding pasta. continue Zepbound  on Friday, encourage 30 minutes of daily walking, and monitor diet by reducing sugar intake and avoiding pasta. Did not lose wt w/wegovy  so changed to zepbound .  Edema-Consider increasing spironolactone  to 50 mg if needed, but monitor potassium levels first. Managed with hydrochlorothiazide  and spironolactone . Spironolactone  dosage adjustment is contingent on potassium levels due to lactulose introduction. Monitor potassium levels before adjusting spironolactone  dosage and continue the current regimen.  Mood Disorder   Experiencing mood instability with episodes of extreme anger. On duloxetine , Buspar , and hydroxyzine . Interested in medication adjustment for better symptom management and advised to see a psychiatrist as she is on mult meds now and beyond my scope of  practice. Discussed regular hydroxyzine  use for anxiety. Refer to a psychiatrist for medication management and evaluation. Continue duloxetine  and Buspar  as prescribed and consider regular hydroxyzine , one tablet three times daily, for anxiety.  Epilepsy   Under evaluation for epilepsy. Recent EEG showed no evidence of epilepsy. An at-home EEG is planned to investigate symptoms, possibly related to sleep paralysis. On Keppra  with therapeutic levels confirmed. Proceed with the at-home EEG study and continue Keppra  as prescribed.  B12 deficiency-getting injections DUB-currently on ocp and progestin-advised to f/u gyn  General Health Maintenance   Lifestyle modifications include increased physical activity and dietary changes. Managing stress and mental health with medications and plans to see a psychiatrist. Concerns about potential diabetes due to weight gain. Encourage physical activity and a healthy diet. Monitor mental health and stress, seeking psychiatric support as needed. Check A1c during next week's blood work for diabetes assessment.  Follow-up   Requires follow-up for liver function, potassium levels, and mental health management. Needs local gynecologist and cardiologist for ongoing care. Contact Medicaid to find a local gynecologist and cardiologist. Return for follow-up in two months to reassess weight management and overall health.    Return in about 2 months (around 04/12/2024) for chronic follow-up.  1 wk for labs.  Jenkins CHRISTELLA Carrel, MD

## 2024-02-11 ENCOUNTER — Ambulatory Visit: Payer: Self-pay | Admitting: Gastroenterology

## 2024-02-11 LAB — PHOSPHATIDYLETHANOL (PETH)
Phosphatidylethanol (PEth): NEGATIVE ng/mL
Phosphatidylethanol: NEGATIVE

## 2024-02-11 NOTE — Progress Notes (Signed)
 Janice Arnold,  Your PETH was again negative, indicating no recent alcohol use.  Please continue to completely abstain from alcohol.

## 2024-02-17 ENCOUNTER — Other Ambulatory Visit

## 2024-02-18 ENCOUNTER — Ambulatory Visit: Admitting: Family Medicine

## 2024-02-19 ENCOUNTER — Encounter: Payer: Self-pay | Admitting: Family Medicine

## 2024-02-19 DIAGNOSIS — M546 Pain in thoracic spine: Secondary | ICD-10-CM | POA: Diagnosis not present

## 2024-02-19 DIAGNOSIS — M545 Low back pain, unspecified: Secondary | ICD-10-CM | POA: Diagnosis not present

## 2024-02-20 ENCOUNTER — Other Ambulatory Visit: Payer: Self-pay | Admitting: Family Medicine

## 2024-02-20 ENCOUNTER — Other Ambulatory Visit (HOSPITAL_COMMUNITY): Payer: Self-pay

## 2024-02-20 ENCOUNTER — Encounter: Payer: Self-pay | Admitting: Family Medicine

## 2024-02-20 ENCOUNTER — Telehealth: Payer: Self-pay

## 2024-02-20 MED ORDER — ZEPBOUND 10 MG/0.5ML ~~LOC~~ SOAJ: 10.0000 mg | SUBCUTANEOUS | 0 refills | Status: DC

## 2024-02-20 NOTE — Telephone Encounter (Signed)
 Pharmacy Patient Advocate Encounter   Received notification from CoverMyMeds that prior authorization for  Zepbound  10MG /0.5ML pen-injectorsis required/requested.   Insurance verification completed.   The patient is insured through [facility] .   Per test claim: PA required; PA submitted to above mentioned insurance via CoverMyMeds Key/confirmation #/EOC [id number] Status is pending

## 2024-02-21 DIAGNOSIS — Z419 Encounter for procedure for purposes other than remedying health state, unspecified: Secondary | ICD-10-CM | POA: Diagnosis not present

## 2024-02-23 ENCOUNTER — Telehealth: Payer: Self-pay

## 2024-02-23 ENCOUNTER — Other Ambulatory Visit (HOSPITAL_COMMUNITY): Payer: Self-pay

## 2024-02-23 NOTE — Telephone Encounter (Signed)
 Pharmacy Patient Advocate Encounter  Received notification from [facility] Medicaid that Prior Authorization for Emgality  120MG /ML auto-injectors (migraine)  has /been APPROVED from 02/09/24 to 02/22/25  PA #/Case ID/Reference #: [id number]

## 2024-02-23 NOTE — Telephone Encounter (Signed)
 Pharmacy Patient Advocate Encounter   Received notification from Onbase that prior authorization for Emgality  120MG /ML auto-injectors (migraine) is required/requested.   Insurance verification completed.   The patient is insured through [facility] [place] .   Per test claim: PA required; PA submitted to above mentioned insurance via CoverMyMeds Key/confirmation #/EOC [id number] Status is pending

## 2024-02-23 NOTE — Telephone Encounter (Signed)
Noted will advise patient 

## 2024-02-24 DIAGNOSIS — N939 Abnormal uterine and vaginal bleeding, unspecified: Secondary | ICD-10-CM | POA: Diagnosis not present

## 2024-02-24 DIAGNOSIS — F331 Major depressive disorder, recurrent, moderate: Secondary | ICD-10-CM | POA: Diagnosis not present

## 2024-02-24 DIAGNOSIS — F419 Anxiety disorder, unspecified: Secondary | ICD-10-CM | POA: Diagnosis not present

## 2024-02-24 NOTE — Telephone Encounter (Signed)
 Patient notified of message below.

## 2024-02-25 ENCOUNTER — Telehealth: Payer: Self-pay

## 2024-02-25 ENCOUNTER — Other Ambulatory Visit (HOSPITAL_COMMUNITY): Payer: Self-pay

## 2024-02-25 NOTE — Telephone Encounter (Signed)
 Pharmacy Patient Advocate Encounter   Received notification from Patient Advice Request messages that prior authorization for Zepbound  10MG /0.5ML pen-injectors is required/requested.   Insurance verification completed.   The patient is insured through [facility] [place] .   Per test claim: Refill too soon. PA is not needed at this time. Medication was filled 02/25/24. Next eligible fill date is 03/16/24.

## 2024-02-27 DIAGNOSIS — Z6841 Body Mass Index (BMI) 40.0 and over, adult: Secondary | ICD-10-CM | POA: Diagnosis not present

## 2024-02-27 DIAGNOSIS — Z79899 Other long term (current) drug therapy: Secondary | ICD-10-CM | POA: Diagnosis not present

## 2024-02-27 DIAGNOSIS — S22000A Wedge compression fracture of unspecified thoracic vertebra, initial encounter for closed fracture: Secondary | ICD-10-CM | POA: Diagnosis not present

## 2024-03-01 ENCOUNTER — Ambulatory Visit: Admitting: Orthopedic Surgery

## 2024-03-03 ENCOUNTER — Other Ambulatory Visit: Payer: Self-pay | Admitting: Gastroenterology

## 2024-03-03 DIAGNOSIS — K7030 Alcoholic cirrhosis of liver without ascites: Secondary | ICD-10-CM

## 2024-03-03 DIAGNOSIS — R1115 Cyclical vomiting syndrome unrelated to migraine: Secondary | ICD-10-CM

## 2024-03-11 ENCOUNTER — Other Ambulatory Visit: Payer: Self-pay | Admitting: Family Medicine

## 2024-03-11 ENCOUNTER — Telehealth: Payer: Self-pay | Admitting: Orthopedic Surgery

## 2024-03-11 DIAGNOSIS — F4323 Adjustment disorder with mixed anxiety and depressed mood: Secondary | ICD-10-CM

## 2024-03-11 NOTE — Telephone Encounter (Signed)
 Discharge letter mailed certified to patient,RETURNED/UNABLE TO [facility]

## 2024-03-15 ENCOUNTER — Other Ambulatory Visit: Payer: Self-pay | Admitting: Family Medicine

## 2024-03-15 DIAGNOSIS — E559 Vitamin D deficiency, unspecified: Secondary | ICD-10-CM

## 2024-03-15 DIAGNOSIS — F329 Major depressive disorder, single episode, unspecified: Secondary | ICD-10-CM | POA: Diagnosis not present

## 2024-03-15 DIAGNOSIS — F411 Generalized anxiety disorder: Secondary | ICD-10-CM | POA: Diagnosis not present

## 2024-03-18 ENCOUNTER — Other Ambulatory Visit: Payer: Self-pay | Admitting: Family Medicine

## 2024-03-18 DIAGNOSIS — M5416 Radiculopathy, lumbar region: Secondary | ICD-10-CM | POA: Diagnosis not present

## 2024-03-18 DIAGNOSIS — R600 Localized edema: Secondary | ICD-10-CM

## 2024-03-19 DIAGNOSIS — S22000A Wedge compression fracture of unspecified thoracic vertebra, initial encounter for closed fracture: Secondary | ICD-10-CM | POA: Diagnosis not present

## 2024-03-19 DIAGNOSIS — Z6841 Body Mass Index (BMI) 40.0 and over, adult: Secondary | ICD-10-CM | POA: Diagnosis not present

## 2024-03-19 DIAGNOSIS — Z79899 Other long term (current) drug therapy: Secondary | ICD-10-CM | POA: Diagnosis not present

## 2024-03-22 ENCOUNTER — Encounter: Payer: Self-pay | Admitting: Family Medicine

## 2024-03-23 DIAGNOSIS — Z419 Encounter for procedure for purposes other than remedying health state, unspecified: Secondary | ICD-10-CM | POA: Diagnosis not present

## 2024-03-25 DIAGNOSIS — F419 Anxiety disorder, unspecified: Secondary | ICD-10-CM | POA: Diagnosis not present

## 2024-03-25 DIAGNOSIS — F331 Major depressive disorder, recurrent, moderate: Secondary | ICD-10-CM | POA: Diagnosis not present

## 2024-03-26 ENCOUNTER — Other Ambulatory Visit: Payer: Self-pay | Admitting: Gastroenterology

## 2024-03-29 ENCOUNTER — Other Ambulatory Visit: Payer: Self-pay | Admitting: Family Medicine

## 2024-03-29 ENCOUNTER — Encounter: Payer: Self-pay | Admitting: Family Medicine

## 2024-03-29 DIAGNOSIS — F329 Major depressive disorder, single episode, unspecified: Secondary | ICD-10-CM | POA: Diagnosis not present

## 2024-03-29 DIAGNOSIS — F411 Generalized anxiety disorder: Secondary | ICD-10-CM | POA: Diagnosis not present

## 2024-03-29 MED ORDER — ZEPBOUND 12.5 MG/0.5ML ~~LOC~~ SOAJ: 12.5000 mg | SUBCUTANEOUS | 0 refills | Status: DC

## 2024-03-31 ENCOUNTER — Ambulatory Visit: Payer: Self-pay | Admitting: Family Medicine

## 2024-03-31 ENCOUNTER — Other Ambulatory Visit (INDEPENDENT_AMBULATORY_CARE_PROVIDER_SITE_OTHER)

## 2024-03-31 DIAGNOSIS — K760 Fatty (change of) liver, not elsewhere classified: Secondary | ICD-10-CM

## 2024-03-31 DIAGNOSIS — Z79899 Other long term (current) drug therapy: Secondary | ICD-10-CM | POA: Diagnosis not present

## 2024-03-31 LAB — CBC WITH DIFFERENTIAL/PLATELET
Basophils Absolute: 0.1 K/uL (ref 0.0–0.1)
Basophils Relative: 1.1 % (ref 0.0–3.0)
Eosinophils Absolute: 0.1 K/uL (ref 0.0–0.7)
Eosinophils Relative: 1.7 % (ref 0.0–5.0)
HCT: 39.5 % (ref 36.0–46.0)
Hemoglobin: 12.8 g/dL (ref 12.0–15.0)
Lymphocytes Relative: 34 % (ref 12.0–46.0)
Lymphs Abs: 2.9 K/uL (ref 0.7–4.0)
MCHC: 32.4 g/dL (ref 30.0–36.0)
MCV: 85.4 fl (ref 78.0–100.0)
Monocytes Absolute: 0.4 K/uL (ref 0.1–1.0)
Monocytes Relative: 4.6 % (ref 3.0–12.0)
Neutro Abs: 4.9 K/uL (ref 1.4–7.7)
Neutrophils Relative %: 58.6 % (ref 43.0–77.0)
Platelets: 324 K/uL (ref 150.0–400.0)
RBC: 4.63 Mil/uL (ref 3.87–5.11)
RDW: 14.7 % (ref 11.5–15.5)
WBC: 8.4 K/uL (ref 4.0–10.5)

## 2024-03-31 LAB — COMPREHENSIVE METABOLIC PANEL WITH GFR
ALT: 22 U/L (ref 0–35)
AST: 29 U/L (ref 0–37)
Albumin: 4.5 g/dL (ref 3.5–5.2)
Alkaline Phosphatase: 50 U/L (ref 39–117)
BUN: 15 mg/dL (ref 6–23)
CO2: 26 meq/L (ref 19–32)
Calcium: 9.4 mg/dL (ref 8.4–10.5)
Chloride: 100 meq/L (ref 96–112)
Creatinine, Ser: 0.86 mg/dL (ref 0.40–1.20)
GFR: 83.16 mL/min (ref >60.00)
Glucose, Bld: 124 mg/dL — ABNORMAL HIGH (ref 70–99)
Potassium: 3.9 meq/L (ref 3.5–5.1)
Sodium: 136 meq/L (ref 135–145)
Total Bilirubin: 0.5 mg/dL (ref 0.2–1.2)
Total Protein: 8 g/dL (ref 6.0–8.3)

## 2024-03-31 LAB — HEMOGLOBIN A1C: Hgb A1c MFr Bld: 7.3 % — ABNORMAL HIGH (ref 4.6–6.5)

## 2024-03-31 LAB — MAGNESIUM: Magnesium: 1.9 mg/dL (ref 1.5–2.5)

## 2024-03-31 NOTE — Progress Notes (Signed)
 Most of the labs better, but A1C-now technically diabetes.  Already on zepbound (will discuss more at appt next month).  Keep working on diet and exercise

## 2024-04-04 ENCOUNTER — Other Ambulatory Visit: Payer: Self-pay | Admitting: Family Medicine

## 2024-04-05 DIAGNOSIS — M545 Low back pain, unspecified: Secondary | ICD-10-CM | POA: Diagnosis not present

## 2024-04-07 ENCOUNTER — Other Ambulatory Visit: Payer: Self-pay | Admitting: Medical Genetics

## 2024-04-13 DIAGNOSIS — N939 Abnormal uterine and vaginal bleeding, unspecified: Secondary | ICD-10-CM | POA: Diagnosis not present

## 2024-04-14 ENCOUNTER — Ambulatory Visit: Admitting: Family Medicine

## 2024-04-14 ENCOUNTER — Encounter: Payer: Self-pay | Admitting: Family Medicine

## 2024-04-14 VITALS — BP 105/72 | HR 96 | Temp 98.6°F | Resp 18 | Ht 67.0 in | Wt 282.2 lb

## 2024-04-14 DIAGNOSIS — Z79899 Other long term (current) drug therapy: Secondary | ICD-10-CM

## 2024-04-14 DIAGNOSIS — R569 Unspecified convulsions: Secondary | ICD-10-CM | POA: Diagnosis not present

## 2024-04-14 DIAGNOSIS — K760 Fatty (change of) liver, not elsewhere classified: Secondary | ICD-10-CM | POA: Diagnosis not present

## 2024-04-14 DIAGNOSIS — Z23 Encounter for immunization: Secondary | ICD-10-CM | POA: Diagnosis not present

## 2024-04-14 DIAGNOSIS — G43009 Migraine without aura, not intractable, without status migrainosus: Secondary | ICD-10-CM

## 2024-04-14 DIAGNOSIS — E538 Deficiency of other specified B group vitamins: Secondary | ICD-10-CM | POA: Diagnosis not present

## 2024-04-14 DIAGNOSIS — E876 Hypokalemia: Secondary | ICD-10-CM

## 2024-04-14 DIAGNOSIS — G43709 Chronic migraine without aura, not intractable, without status migrainosus: Secondary | ICD-10-CM

## 2024-04-14 DIAGNOSIS — I10 Essential (primary) hypertension: Secondary | ICD-10-CM | POA: Diagnosis not present

## 2024-04-14 DIAGNOSIS — F4323 Adjustment disorder with mixed anxiety and depressed mood: Secondary | ICD-10-CM

## 2024-04-14 MED ORDER — NURTEC 75 MG PO TBDP: 75.0000 mg | ORAL_TABLET | Freq: Every day | ORAL | 2 refills | Status: DC | PRN

## 2024-04-14 MED ORDER — ZEPBOUND 15 MG/0.5ML ~~LOC~~ SOAJ: 15.0000 mg | SUBCUTANEOUS | 1 refills | Status: DC

## 2024-04-14 MED ORDER — CYANOCOBALAMIN 1000 MCG/ML IJ SOLN
1000.0000 ug | Freq: Once | INTRAMUSCULAR | Status: AC
  Administered 2024-04-14: 1000 ug via INTRAMUSCULAR

## 2024-04-14 NOTE — Patient Instructions (Signed)
 It was very nice to see you today!  Work on exercise and decrease sugars and starches  Referral placed to chronic care mgmt   PLEASE NOTE:  If you had any lab tests please let us  know if you have not heard back within a few days. You may see your results on MyChart before we have a chance to review them but we will give you a call once they are reviewed by us . If we ordered any referrals today, please let us  know if you have not heard from their office within the next week.   Please try these tips to maintain a healthy lifestyle:  Eat most of your calories during the day when you are active. Eliminate processed foods including packaged sweets (pies, cakes, cookies), reduce intake of potatoes, white bread, white pasta, and white rice. Look for whole grain options, oat flour or almond flour.  Each meal should contain half fruits/vegetables, one quarter protein, and one quarter carbs (no bigger than a computer mouse).  Cut down on sweet beverages. This includes juice, soda, and sweet tea. Also watch fruit intake, though this is a healthier sweet option, it still contains natural sugar! Limit to 3 servings daily.  Drink at least 1 glass of water  with each meal and aim for at least 8 glasses per day  Exercise at least 150 minutes every week.

## 2024-04-14 NOTE — Progress Notes (Signed)
 Subjective:     Patient ID: Janice Arnold, female    DOB: [birth year], 43 y.o.   MRN: [id number]  Chief Complaint  Patient presents with   Medical Management of Chronic Issues    2 month follow-up     HPI Discussed the use of AI scribe software for clinical note transcription with the patient, who gave verbal consent to proceed.  History of Present Illness Janice Arnold is a 43 year old female with diabetes and chronic back pain who presents for follow-up on multiple issues. Had computer issues so chart wasn't accessible for part of the visit.  She is experiencing ongoing issues with weight gain and elevated blood glucose levels. Her A1c has increased to 7.3; she reports she has never been close to diabetic levels before. She attributes some of the weight gain to her medication regimen and increased sugar intake. Blood glucose levels have varied, with some readings in the 70s and 80s, and others as high as 152. She is concerned about being labeled as diabetic and the implications it may have on her life. Currently on zepbound  for wt loss, but wt keeps increasing.  On 12.5mg  weekly  She continues to experience chronic back pain, which has now started radiating down her right leg. She recently had an MRI to investigate these symptoms. The pain has been severe enough to prevent her from driving and working, and she requires assistance to attend her numerous medical appointments. Seeing ortho  She is dealing with significant depression and anxiety, which she feels are exacerbated by her medical conditions and personal stressors, including issues with her husband. She is not currently feeling suicidal but describes episodes of panic and anxiety, for which she takes hydroxyzine . She has recently started seeing a psychiatrist and therapist, with appointments scheduled every four to six weeks. Confusion about meds and what taking  Her medication regimen is complex, and she is currently taking  duloxetine , gabapentin, tizanidine, oxycodone /APAP, propranolol, norethindrone , omeprazole , spironolactone , hydrochlorothiazide , and Zepbound . She has recently stopped taking Lexapro , amitriptyline , and buspirone . She is also using Emgality  for migraine prevention and reports frequent migraines, occurring three to five times a week, despite this treatment. She finds relief with Nurtec for acute migraine attacks.  She is also managing seizures with lamotrigine and Keppra . She has undergone an EEG and is scheduled for a sleep study to further evaluate her seizures  She is taking potassium supplements-chronic hypokalemia and lactulose  for MAFLD as part of her treatment regimen. .  She expresses frustration with the complexity of her medical care and the impact it has on her daily life, including her inability to work and the stress of managing multiple appointments and medications and caring for husband w/[employer].    Health Maintenance Due  Topic Date Due   Cervical Cancer Screening (HPV/Pap Cotest)  Never done   MAMMOGRAM  04/29/2020    Past Medical History:  Diagnosis Date   Alcohol abuse    history of heavy alcohol abuse, last drank alcohol in 06/2022   Anemia 2023   s/p iron  infusions   Anxiety    Follows with PCP. Dr. Jenkins Arnold.   Chronic kidney disease 8/23   Clotting disorder (HCC) 03/12/22   Vaginal bleeding/very heavy periods   Compression fracture of body of thoracic vertebra (HCC) 11/05/2022   T4, T5, T6 / Follows w/ orthopedic, Dr. Ozell Ada. Fractures occurred while patient was having a seizure, per patient.   Cyclic vomiting syndrome    hospital  admission on 05/28/22 for vomiting, follows with gastroenterologist Dr. Glendia Holt.   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 Glendia Holt.   GERD (gastroesophageal reflux disease)     takes omeprazole  daily   Heart murmur 1982   since childhood   Hyperlipidemia    11/20/22 lipid panel in Epic   Hypokalemia 10/15/2022   K+ level 2.5   Menorrhagia    Palpitations    Follows w/ cardiology, Damien Boos, NP. 11/14/2022 echocardiogram EF 60 65%, 12/31/22 Event heart monitor results in Epic.   PONV (postoperative nausea and vomiting)    Prolonged QT interval    Follows w/ cardiology, Damien Braver, NP.   Seizure (HCC) 06/06/2022   Follows with neurologist, Dr. Hulan Falling @ [facility]. Seizures were thought to be related to stopping alcohol. However, patient has had seizures months after quitting alcohol. As of 02/06/23, last seizure was on 11/07/22. Keppra  was then increased to bid.   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 See 11/05/22 Head CT and MRI in Epic. Resolved per pt on 02/06/23.   Tremors of nervous system    whole body tremors, follows with Dr. Hulan Falling @ [facility].   Vitamin deficiency 2023   B12 deficiency, receiving monthly B12 injections / Vitamin D  deficiency, taking supplement every two weeks   Wears contact lenses    Wears glasses     Past Surgical History:  Procedure Laterality Date   COLONOSCOPY WITH ESOPHAGOGASTRODUODENOSCOPY (EGD)  03/27/2022   one rectal polyp   LAPAROSCOPIC APPENDECTOMY N/A 03/02/2022   Procedure: APPENDECTOMY LAPAROSCOPIC;  Surgeon: Belinda Cough, MD;  Location: [facility];  Service: General;  Laterality: N/A;   ROBOTIC ASSISTED LAPAROSCOPIC HYSTERECTOMY AND SALPINGECTOMY Bilateral 03/04/2023   Procedure: ABORTED XI ROBOTIC ASSISTED LAPAROSCOPIC HYSTERECTOMY AND SALPINGECTOMY;  Surgeon: Gorge Ade, MD;  Location: [facility];  Service: Gynecology;  Laterality: Bilateral;  Requests 2/1/2 hrs.     Current Outpatient Medications:    amoxicillin (AMOXIL) 500 MG tablet, Take 500 mg by mouth every 8 (eight) hours., Disp: , Rfl:     chlorhexidine  (PERIDEX ) 0.12 % solution, 2 (two) times daily., Disp: , Rfl:    DULoxetine  (CYMBALTA ) 60 MG capsule, TAKE 1 CAPSULE BY MOUTH EVERY DAY, Disp: 90 capsule, Rfl: 1   gabapentin (NEURONTIN) 300 MG capsule, Take 300 mg by mouth at bedtime., Disp: , Rfl:    Galcanezumab -gnlm (EMGALITY ) 120 MG/ML SOAJ, Inject 120 mg into the skin every 30 (thirty) days., Disp: 1.12 mL, Rfl: 12   hydrochlorothiazide  (HYDRODIURIL ) 25 MG tablet, TAKE 1 TABLET BY MOUTH DAILY AS NEEDED., Disp: 90 tablet, Rfl: 0   hydrOXYzine  (VISTARIL ) 25 MG capsule, Take 1 capsule (25 mg total) by mouth every 6 (six) hours as needed., Disp: 360 capsule, Rfl: 1   ibuprofen  (ADVIL ) 800 MG tablet, Take by mouth., Disp: , Rfl:    KLOR-CON  M20 20 MEQ tablet, Take 2 tablets (40 mEq total) by mouth 3 (three) times daily., Disp: 540 tablet, Rfl: 1   lactulose  (CHRONULAC ) 10 GM/15ML solution, TAKE THREE TIMES DAILY AS NEEDED, Disp: 4257 mL, Rfl: 1   lamoTRIgine (LAMICTAL) 25 MG tablet, Take by mouth., Disp: , Rfl:    levETIRAcetam  (KEPPRA ) 750 MG tablet, Take 1 tablet (750 mg total) by mouth  2 (two) times daily., Disp: 180 tablet, Rfl: 3   lidocaine  (LIDODERM ) 5 %, PLACE 1 PATCH ONTO THE SKIN DAILY. REMOVE & DISCARD PATCH WITHIN 12 HOURS OR AS DIRECTED BY MD, Disp: 30 patch, Rfl: 0   melatonin 5 MG TABS, Take 5 mg by mouth. 25 mg total, Disp: , Rfl:    meloxicam (MOBIC) 15 MG tablet, Take 15 mg by mouth daily., Disp: , Rfl:    norethindrone  (AYGESTIN ) 5 MG tablet, Take 1 tablet (5 mg total) by mouth in the morning and at bedtime., Disp: 60 tablet, Rfl: 2   omeprazole  (PRILOSEC) 40 MG capsule, Take 1 capsule (40 mg total) by mouth daily., Disp: 90 capsule, Rfl: 1   ondansetron  (ZOFRAN -ODT) 4 MG disintegrating tablet, TAKE 1 TABLET BY MOUTH EVERY 8 HOURS AS NEEDED FOR NAUSEA AND VOMITING, Disp: 20 tablet, Rfl: 1   oxyCODONE -acetaminophen  (PERCOCET) 10-325 MG tablet, Take 1 tablet by mouth every 4 (four) hours as needed for pain.,  Disp: , Rfl:    promethazine  (PHENERGAN ) 25 MG suppository, Place 1 suppository (25 mg total) rectally every 6 (six) hours as needed for nausea or vomiting., Disp: 12 each, Rfl: 3   promethazine  (PHENERGAN ) 25 MG tablet, Take 1 tablet (25 mg total) by mouth daily., Disp: 90 tablet, Rfl: 0   propranolol (INDERAL) 20 MG tablet, Take 20 mg by mouth 2 (two) times daily as needed., Disp: , Rfl:    Rimegepant Sulfate (NURTEC) 75 MG TBDP, Take 1 tablet (75 mg total) by mouth daily as needed., Disp: 8 tablet, Rfl: 2   spironolactone  (ALDACTONE ) 25 MG tablet, Take 1 tablet (25 mg total) by mouth daily., Disp: 90 tablet, Rfl: 3   tirzepatide  (ZEPBOUND ) 15 MG/0.5ML Pen, Inject 15 mg into the skin once a week., Disp: 2 mL, Rfl: 1   tiZANidine (ZANAFLEX) 4 MG capsule, , Disp: , Rfl:    Vitamin D , Ergocalciferol , (DRISDOL ) 1.25 MG (50000 UNIT) CAPS capsule, TAKE 1 CAPSULE (50,000 UNITS TOTAL) BY MOUTH EVERY 7 (SEVEN) DAYS, Disp: 12 capsule, Rfl: 0  No Known Allergies ROS neg/noncontributory except as noted HPI/below      Objective:     BP 105/72   Pulse 96   Temp 98.6 F (37 C) (Temporal)   Resp 18   Ht 5' 7 (1.702 m)   Wt 282 lb 4 oz (128 kg)   LMP 01/29/2023 (Approximate)   SpO2 98%   BMI 44.21 kg/m  Wt Readings from Last 3 Encounters:  04/14/24 282 lb 4 oz (128 kg)  02/10/24 264 lb 4 oz (119.9 kg)  02/06/24 267 lb (121.1 kg)    Physical Exam   Gen: WDWN NAD HEENT: NCAT, conjunctiva not injected, sclera nonicteric NECK:  supple, no thyromegaly, no nodes, no carotid bruits CARDIAC: RRR, S1S2+, no murmur. DP 2+B LUNGS: CTAB. No wheezes ABDOMEN:  BS+, soft, NTND, No HSM, no masses EXT:  no edema MSK: no gross abnormalities.  NEURO: A&O x3.  CN II-XII intact.  PSYCH: normal mood. Good eye contact  Reviewed labs  I personally spent a total of 45 minutes in the care of the patient today including preparing to see the patient, getting/reviewing separately obtained history, performing  a medically appropriate exam/evaluation, counseling and educating, documenting clinical information in the EHR, independently interpreting results, and communicating results.      Assessment & Plan:  Primary hypertension -     AMB Referral [facility] Care Management  Metabolic dysfunction-associated steatotic liver disease (MASLD)  Seizure (HCC)  Migraine without aura and without status migrainosus, not intractable  Chronic migraine w/o aura w/o status migrainosus, not intractable  B12 deficiency -     Cyanocobalamin   Adjustment disorder with mixed anxiety and depressed mood -     AMB Referral [facility] Care Management  Hypokalemia -     AMB Referral [facility] Care Management  High risk medication use -     AMB Referral [facility] Care Management  Need for influenza vaccination -     Flu vaccine trivalent PF, 6mos and older(Flulaval,Afluria,Fluarix,Fluzone)  Other orders -     Nurtec; Take 1 tablet (75 mg total) by mouth daily as needed.  Dispense: 8 tablet; Refill: 2 -     Zepbound ; Inject 15 mg into the skin once a week.  Dispense: 2 mL; Refill: 1  Assessment and Plan Assessment & Plan Type 2 diabetes mellitus and obesity  - new dx Her recent A1c is 7.3, confirming diabetes. She has gained nearly 20 pounds since July, raising concerns about medication-related weight gain. She is worried about the implications of being labeled diabetic. Encourage reduction of sugar intake and regular exercise, including short walks multiple times a day.  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 A recent MRI was performed, and she continues management with pain specialists. Pain and transportation issues make attending appointments difficult. Continue follow-up with the pain management specialist and review MRI results once available.  Chronic migraine without aura   She experiences migraines 3-5 times a week despite  monthly Emgality  injections. Nurtec provides relief but requires prior authorization. There are concerns about potential rebound headaches from frequent over-the-counter medication use. Continue Emgality  injections monthly and prescribe Nurtec for acute migraine relief. Caution against overuse of over-the-counter medications to prevent rebound headaches.  Seizure disorder   Ongoing management with a neurologist includes recent EEG and a planned sleep study. Current medications are Lamictal and Keppra . Episodes of shaking and difficulty breathing may be pseudo seizures related to anxiety. Continue Lamictal and Keppra  and proceed with the planned sleep study.  Adjustment disorder with mixed anxiety and depressed mood   She experiences significant stress from multiple medical issues and personal circumstances. Current medications include duloxetine  and hydroxyzine . She recently initiated psychiatric care with therapy and medication management. Concerns exist about medication interactions and side effects contributing to anxiety and depression. She is not suicidal but experiences panic attacks and anxiety, especially during stressful interactions with her husband. Continue duloxetine  and hydroxyzine , coordinate with a psychiatrist for medication management, and refer to ambulatory care management for a comprehensive medication review and support.  Fatty liver disease   Liver function is currently well-managed, with previous concerns about medication interactions affecting liver health.  Essential hypertension   Current management includes propranolol, which replaced metoprolol . Continue propranolol and remove metoprolol  from the medication list. And hydrochlorothiazide  and spironolactone   General Health Maintenance   Emphasize the importance of exercise and reducing sugar intake as part of overall health maintenance. Encourage regular exercise and advise on reducing sugar and starch intake.  Follow-Up    Ongoing coordination of care with multiple specialists and services is necessary. An application for Social Security disability is in progress. Refer to ambulatory care management for comprehensive support, coordinate with specialists for ongoing care, and assist with the Social Security disability application as needed.    Return in about 4 weeks (around 05/12/2024) for chronic follow-up.  Janice CHRISTELLA Carrel, MD

## 2024-04-15 ENCOUNTER — Telehealth: Payer: Self-pay

## 2024-04-15 ENCOUNTER — Encounter: Payer: Self-pay | Admitting: Family

## 2024-04-15 ENCOUNTER — Other Ambulatory Visit (HOSPITAL_COMMUNITY): Payer: Self-pay

## 2024-04-15 DIAGNOSIS — M48061 Spinal stenosis, lumbar region without neurogenic claudication: Secondary | ICD-10-CM | POA: Diagnosis not present

## 2024-04-15 DIAGNOSIS — F329 Major depressive disorder, single episode, unspecified: Secondary | ICD-10-CM | POA: Diagnosis not present

## 2024-04-15 DIAGNOSIS — F411 Generalized anxiety disorder: Secondary | ICD-10-CM | POA: Diagnosis not present

## 2024-04-15 NOTE — Telephone Encounter (Signed)
 Pharmacy Patient Advocate Encounter  Received notification from [facility] MEDICAID that Prior Authorization for Nurtec 75MG  dispersible tablets  has been DENIED.  Full denial letter will be uploaded to the media tab. See denial reason below.    PA #/Case ID/Reference #: [id number]

## 2024-04-15 NOTE — Telephone Encounter (Signed)
 Pharmacy Patient Advocate Encounter   Received notification from Onbase that prior authorization for Zepbound  15MG /0.5ML pen-injectors is required/requested.   Insurance verification completed.   The patient is insured through [facility] MEDICAID .   Per test claim: Refill too soon. PA is not needed at this time. Medication was filled 03/30/24. Next eligible fill date is 04/21/24.

## 2024-04-15 NOTE — Telephone Encounter (Signed)
 Pharmacy Patient Advocate Encounter   Received notification from Onbase that prior authorization for Nurtec 75MG  dispersible tablets is required/requested.   Insurance verification completed.   The patient is insured through [facility] MEDICAID .   Per test claim: PA required; PA submitted to above mentioned insurance via Latent Key/confirmation #/EOC [id number] Status is pending

## 2024-04-16 ENCOUNTER — Encounter: Payer: Self-pay | Admitting: *Deleted

## 2024-04-16 ENCOUNTER — Telehealth: Payer: Self-pay | Admitting: Family Medicine

## 2024-04-16 DIAGNOSIS — F331 Major depressive disorder, recurrent, moderate: Secondary | ICD-10-CM | POA: Diagnosis not present

## 2024-04-16 DIAGNOSIS — R569 Unspecified convulsions: Secondary | ICD-10-CM | POA: Diagnosis not present

## 2024-04-16 DIAGNOSIS — F419 Anxiety disorder, unspecified: Secondary | ICD-10-CM | POA: Diagnosis not present

## 2024-04-16 DIAGNOSIS — Z6841 Body Mass Index (BMI) 40.0 and over, adult: Secondary | ICD-10-CM | POA: Diagnosis not present

## 2024-04-16 DIAGNOSIS — N939 Abnormal uterine and vaginal bleeding, unspecified: Secondary | ICD-10-CM | POA: Diagnosis not present

## 2024-04-16 NOTE — Telephone Encounter (Signed)
 [facility] health faxed Surgical Clearance, to be filled out by provider. Patient requested to send it back via Fax within ASAP. Document is located in providers tray at front office.Please advise at [phone].

## 2024-04-16 NOTE — Telephone Encounter (Signed)
 Patient notified of message below.
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
## 2024-04-19 NOTE — Telephone Encounter (Signed)
 PT IS SCHEDULED WITH [facility] ON 2/5.

## 2024-04-19 NOTE — Telephone Encounter (Signed)
Form placed on desk for review

## 2024-04-20 ENCOUNTER — Encounter: Payer: Self-pay | Admitting: Family Medicine

## 2024-04-20 DIAGNOSIS — Z79899 Other long term (current) drug therapy: Secondary | ICD-10-CM | POA: Diagnosis not present

## 2024-04-20 DIAGNOSIS — R030 Elevated blood-pressure reading, without diagnosis of hypertension: Secondary | ICD-10-CM | POA: Diagnosis not present

## 2024-04-20 DIAGNOSIS — S22000A Wedge compression fracture of unspecified thoracic vertebra, initial encounter for closed fracture: Secondary | ICD-10-CM | POA: Diagnosis not present

## 2024-04-20 DIAGNOSIS — Z6841 Body Mass Index (BMI) 40.0 and over, adult: Secondary | ICD-10-CM | POA: Diagnosis not present

## 2024-04-21 ENCOUNTER — Other Ambulatory Visit (HOSPITAL_COMMUNITY): Payer: Self-pay

## 2024-04-21 ENCOUNTER — Telehealth: Payer: Self-pay

## 2024-04-21 NOTE — Telephone Encounter (Signed)
 Surgical clearance faxed in to [facility] for a major pelvic surgery

## 2024-04-22 DIAGNOSIS — F331 Major depressive disorder, recurrent, moderate: Secondary | ICD-10-CM | POA: Diagnosis not present

## 2024-04-22 DIAGNOSIS — F419 Anxiety disorder, unspecified: Secondary | ICD-10-CM | POA: Diagnosis not present

## 2024-04-23 DIAGNOSIS — Z419 Encounter for procedure for purposes other than remedying health state, unspecified: Secondary | ICD-10-CM | POA: Diagnosis not present

## 2024-04-26 ENCOUNTER — Encounter: Payer: Self-pay | Admitting: Family Medicine

## 2024-04-26 ENCOUNTER — Ambulatory Visit (HOSPITAL_BASED_OUTPATIENT_CLINIC_OR_DEPARTMENT_OTHER): Payer: Self-pay | Admitting: Psychiatry

## 2024-04-26 ENCOUNTER — Telehealth: Payer: Self-pay | Admitting: *Deleted

## 2024-04-26 VITALS — BP 110/76 | Ht 67.0 in | Wt 272.4 lb

## 2024-04-26 DIAGNOSIS — F4323 Adjustment disorder with mixed anxiety and depressed mood: Secondary | ICD-10-CM

## 2024-04-26 DIAGNOSIS — F411 Generalized anxiety disorder: Secondary | ICD-10-CM | POA: Insufficient documentation

## 2024-04-26 DIAGNOSIS — F331 Major depressive disorder, recurrent, moderate: Secondary | ICD-10-CM | POA: Diagnosis not present

## 2024-04-26 DIAGNOSIS — G43009 Migraine without aura, not intractable, without status migrainosus: Secondary | ICD-10-CM

## 2024-04-26 DIAGNOSIS — F339 Major depressive disorder, recurrent, unspecified: Secondary | ICD-10-CM | POA: Insufficient documentation

## 2024-04-26 MED ORDER — DULOXETINE HCL 30 MG PO CPEP: 30.0000 mg | ORAL_CAPSULE | Freq: Every day | ORAL | 0 refills | Status: DC

## 2024-04-26 MED ORDER — LAMOTRIGINE 25 MG PO TABS: 25.0000 mg | ORAL_TABLET | Freq: Two times a day (BID) | ORAL | 1 refills | Status: DC

## 2024-04-26 MED ORDER — HYDROXYZINE HCL 25 MG PO TABS: 25.0000 mg | ORAL_TABLET | Freq: Three times a day (TID) | ORAL | 0 refills | Status: DC | PRN

## 2024-04-26 MED ORDER — SERTRALINE HCL 50 MG PO TABS: ORAL_TABLET | ORAL | 0 refills | Status: DC

## 2024-04-26 MED ORDER — DULOXETINE HCL 60 MG PO CPEP: 60.0000 mg | ORAL_CAPSULE | Freq: Every day | ORAL | 0 refills | Status: DC

## 2024-04-26 NOTE — Addendum Note (Signed)
 Addended by: CARVIN CROCK on: 04/26/2024 01:01 PM   Modules accepted: Level of Service

## 2024-04-26 NOTE — Progress Notes (Signed)
 Complex Care Management Note  Care Guide Note 04/26/2024 Name: Janice Arnold MRN: [id number] DOB: 26-Nov-1980  [name] is a 43 y.o. year old female who sees Wendolyn Jenkins Jansky, MD for primary care. I reached out to Harlene GORMAN Daring by phone today to offer complex care management services.  Ms. Spofford was given information about Complex Care Management services today including:   The Complex Care Management services include support from the care team which includes your Nurse Care Manager, Clinical Social Worker, or Pharmacist.  The Complex Care Management team is here to help remove barriers to the health concerns and goals most important to you. Complex Care Management services are voluntary, and the patient may decline or stop services at any time by request to their care team member.   Complex Care Management Consent Status: Patient agreed to services and verbal consent obtained.   Follow up plan:  Telephone appointment with complex care management team member scheduled for:  04/29/2024 and 05/05/2024  Encounter Outcome:  Patient Scheduled  Thedford Franks, CMA [place]  [facility], [facility] Guide Direct Dial: [phone]  Fax: [phone] Website: [email or web address]

## 2024-04-28 ENCOUNTER — Other Ambulatory Visit: Payer: Self-pay | Admitting: Family Medicine

## 2024-04-28 DIAGNOSIS — R600 Localized edema: Secondary | ICD-10-CM

## 2024-04-29 ENCOUNTER — Other Ambulatory Visit: Payer: Self-pay | Admitting: Pharmacist

## 2024-04-29 ENCOUNTER — Telehealth: Payer: Self-pay | Admitting: Pharmacist

## 2024-04-29 NOTE — Telephone Encounter (Signed)
 Second outreach to patient today was successful but she asked if we could reschedule appt for tomorrow - rescheduled for 04/30/24 at 11:30am.

## 2024-04-29 NOTE — Telephone Encounter (Signed)
 Attempt was made to contact patient by phone today for initial visit with Clinical Pharmacist regarding medication adherence / complex medication regimen.   Unable to reach patient. LM on VM with my contact number [phone].

## 2024-04-30 ENCOUNTER — Telehealth: Payer: Self-pay | Admitting: Pharmacist

## 2024-04-30 ENCOUNTER — Other Ambulatory Visit: Payer: Self-pay | Admitting: Pharmacist

## 2024-04-30 NOTE — Telephone Encounter (Signed)
 Second unsuccessful attempt was made to contact patient by phone fro initial visit by Clinical Pharmacist regarding medication adherence / education.  Unable to reach patient. LM on VM with my contact number [phone].

## 2024-05-03 DIAGNOSIS — N939 Abnormal uterine and vaginal bleeding, unspecified: Secondary | ICD-10-CM | POA: Diagnosis not present

## 2024-05-04 ENCOUNTER — Other Ambulatory Visit: Payer: Self-pay | Admitting: Family Medicine

## 2024-05-04 DIAGNOSIS — F4323 Adjustment disorder with mixed anxiety and depressed mood: Secondary | ICD-10-CM

## 2024-05-05 ENCOUNTER — Telehealth: Payer: Self-pay | Admitting: *Deleted

## 2024-05-05 ENCOUNTER — Telehealth: Payer: Self-pay | Admitting: Licensed Clinical Social Worker

## 2024-05-05 NOTE — Progress Notes (Unsigned)
 Complex Care Management Care Guide Note  05/05/2024 Name: Janice Arnold MRN: [id number] DOB: 10/01/1980  Janice Arnold is a 43 y.o. year old female who is a primary care patient of Wendolyn Jenkins Jansky, MD and is actively engaged with the care management team. I reached out to Harlene GORMAN Daring by phone today to assist with re-scheduling  with the Pharmacist.  Follow up plan: Unsuccessful telephone outreach attempt made. A HIPAA compliant phone message was left for the patient providing contact information and requesting a return call.  Thedford Franks, CMA [place]  [facility], [facility] Guide Direct Dial: [phone]  Fax: [phone] Website: [email or web address]

## 2024-05-06 ENCOUNTER — Ambulatory Visit: Admitting: Gastroenterology

## 2024-05-07 NOTE — Progress Notes (Signed)
 Complex Care Management Care Guide Note  05/07/2024 Name: YOUA DELONEY MRN: [id number] DOB: Nov 06, 1980  KAIYAH EBER is a 43 y.o. year old female who is a primary care patient of Wendolyn Jenkins Jansky, MD and is actively engaged with the care management team. I reached out to Harlene GORMAN Daring by phone today to assist with re-scheduling  with the Pharmacist.  Follow up plan: Telephone appointment with complex care management team member scheduled for:  05/13/2024  Thedford Franks, CMA [place]  [facility], [facility] Guide Direct Dial: [phone]  Fax: [phone] Website: [email or web address]

## 2024-05-12 ENCOUNTER — Encounter: Payer: Self-pay | Admitting: Family Medicine

## 2024-05-12 ENCOUNTER — Telehealth: Payer: Self-pay

## 2024-05-12 ENCOUNTER — Other Ambulatory Visit: Payer: Self-pay | Admitting: Family Medicine

## 2024-05-12 ENCOUNTER — Telehealth: Payer: Self-pay | Admitting: Pharmacy Technician

## 2024-05-12 ENCOUNTER — Other Ambulatory Visit (HOSPITAL_COMMUNITY): Payer: Self-pay

## 2024-05-12 ENCOUNTER — Ambulatory Visit (INDEPENDENT_AMBULATORY_CARE_PROVIDER_SITE_OTHER): Admitting: Gastroenterology

## 2024-05-12 ENCOUNTER — Ambulatory Visit (INDEPENDENT_AMBULATORY_CARE_PROVIDER_SITE_OTHER): Admitting: Family Medicine

## 2024-05-12 ENCOUNTER — Encounter: Payer: Self-pay | Admitting: Gastroenterology

## 2024-05-12 VITALS — BP 110/80 | HR 88 | Temp 97.9°F | Ht 67.0 in | Wt 282.0 lb

## 2024-05-12 VITALS — BP 130/72 | HR 97 | Ht 67.0 in | Wt 284.0 lb

## 2024-05-12 DIAGNOSIS — R600 Localized edema: Secondary | ICD-10-CM | POA: Diagnosis not present

## 2024-05-12 DIAGNOSIS — F4323 Adjustment disorder with mixed anxiety and depressed mood: Secondary | ICD-10-CM | POA: Diagnosis not present

## 2024-05-12 DIAGNOSIS — Z6841 Body Mass Index (BMI) 40.0 and over, adult: Secondary | ICD-10-CM | POA: Diagnosis not present

## 2024-05-12 DIAGNOSIS — K7030 Alcoholic cirrhosis of liver without ascites: Secondary | ICD-10-CM

## 2024-05-12 DIAGNOSIS — G43709 Chronic migraine without aura, not intractable, without status migrainosus: Secondary | ICD-10-CM | POA: Diagnosis not present

## 2024-05-12 DIAGNOSIS — G43009 Migraine without aura, not intractable, without status migrainosus: Secondary | ICD-10-CM

## 2024-05-12 DIAGNOSIS — E119 Type 2 diabetes mellitus without complications: Secondary | ICD-10-CM

## 2024-05-12 DIAGNOSIS — Z7985 Long-term (current) use of injectable non-insulin antidiabetic drugs: Secondary | ICD-10-CM | POA: Diagnosis not present

## 2024-05-12 DIAGNOSIS — E538 Deficiency of other specified B group vitamins: Secondary | ICD-10-CM

## 2024-05-12 DIAGNOSIS — K760 Fatty (change of) liver, not elsewhere classified: Secondary | ICD-10-CM | POA: Diagnosis not present

## 2024-05-12 DIAGNOSIS — R1115 Cyclical vomiting syndrome unrelated to migraine: Secondary | ICD-10-CM

## 2024-05-12 MED ORDER — ACCU-CHEK GUIDE TEST VI STRP: ORAL_STRIP | 12 refills | Status: AC

## 2024-05-12 MED ORDER — CYANOCOBALAMIN 1000 MCG/ML IJ SOLN
1000.0000 ug | Freq: Once | INTRAMUSCULAR | Status: AC
  Administered 2024-05-12: 1000 ug via INTRAMUSCULAR

## 2024-05-12 MED ORDER — METFORMIN HCL 500 MG PO TABS: 500.0000 mg | ORAL_TABLET | Freq: Two times a day (BID) | ORAL | 1 refills | Status: DC

## 2024-05-12 MED ORDER — ACCU-CHEK FASTCLIX LANCET KIT: 1.0000 | PACK | Freq: Once | 1 refills | Status: AC

## 2024-05-12 MED ORDER — MOUNJARO 15 MG/0.5ML ~~LOC~~ SOAJ: 15.0000 mg | SUBCUTANEOUS | 1 refills | Status: DC

## 2024-05-12 MED ORDER — ACCU-CHEK FASTCLIX LANCETS MISC: 1.0000 | Freq: Every day | 3 refills | Status: AC

## 2024-05-12 MED ORDER — ACCU-CHEK GUIDE ME W/DEVICE KIT: 1.0000 | PACK | Freq: Once | 1 refills | Status: AC

## 2024-05-12 MED ORDER — NURTEC 75 MG PO TBDP: 75.0000 mg | ORAL_TABLET | Freq: Every day | ORAL | 2 refills | Status: DC | PRN

## 2024-05-12 NOTE — Telephone Encounter (Signed)
 Pharmacy Patient Advocate Encounter  Received notification from [place] [facility] that Prior Authorization for  Nurtec 75MG  dispersible tablets has been CANCELLED due to       PA #/Case ID/Reference #: [id number]

## 2024-05-12 NOTE — Patient Instructions (Signed)
 You have been scheduled for an abdominal ultrasound at [facility] (1st floor of hospital) on 05/24/24 at 8:00 am. Please arrive 30 minutes prior to your appointment for registration. Make certain not to have anything to eat or drink 6 hours prior to your appointment. Should you need to reschedule your appointment, please contact radiology at [phone]. This test typically takes about 30 minutes to perform.  Please follow up with Dr. Stacia in 6 months.   _______________________________________________________  If your blood pressure at your visit was 140/90 or greater, please contact your primary care physician to follow up on this.  _______________________________________________________  If you are age 65 or older, your body mass index should be between 23-30. Your Body mass index is 44.48 kg/m. If this is out of the aforementioned range listed, please consider follow up with your Primary Care Provider.  If you are age 73 or younger, your body mass index should be between 19-25. Your Body mass index is 44.48 kg/m. If this is out of the aformentioned range listed, please consider follow up with your Primary Care Provider.   ________________________________________________________  The [facility] providers would like to encourage you to use MYCHART to communicate with providers for non-urgent requests or questions.  Due to long hold times on the telephone, sending your provider a message by [employer] may be a faster and more efficient way to get a response.  Please allow 48 business hours for a response.  Please remember that this is for non-urgent requests.  _______________________________________________________  [facility] is using a team-based approach to care.  Your team is made up of your doctor and two to three APPS. Our APPS (Nurse Practitioners and Physician Assistants) work with your physician to ensure care continuity for you. They are fully qualified to  address your health concerns and develop a treatment plan. They communicate directly with your gastroenterologist to care for you. Seeing the Advanced Practice Practitioners on your physician's team can help you by facilitating care more promptly, often allowing for earlier appointments, access to diagnostic testing, procedures, and other specialty referrals.

## 2024-05-12 NOTE — Progress Notes (Signed)
 Discussed the use of AI scribe software for clinical note transcription with the patient, who gave verbal consent to proceed.  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  She has early cirrhosis, confirmed by liver biopsy, and has been abstinent from alcohol. Her liver enzymes were normal in August, and she is awaiting the results of her most recent labs drawn earlier today.  She continues to abstain from alcohol and her husband is also abstaining, so there is no alcohol in the house. At her last visit in June she was complaining of brain fog and she was started on lactulose  for possible hepatic encephalopathy. She continues to take lactulose  three times a day, which she thinks has improved her cognitive function.  She has been having two to three formed bowel movements daily. She experiences occasional crampy abdominal pain, although this is much less frequent and severe than in the past.   She has had a significant decrease in vomiting. She denies swelling in her legs or abdomen.  No yellowing of the eyes or dark urine.  She was recently diagnosed with diabetes, with an A1c of 7.0%. She has gained nearly 60 pounds over the past year and 100 lbs in the past 2 years, attributing this to lifestyle changes and stress. She has been prescribed metformin but has not yet started it. She is also on Mounjaro, an injectable medication, which was switched from Zepbound .  She experiences anxiety and depression and is transitioning from Cymbalta  to Zoloft  under psychiatric guidance.   She had been started on amitriptyline  for cyclic vomiting syndrome in January 2025, but this was discontinued by her psychiatrist. She has been off amitriptyline  for six weeks and is taking hydroxyzine  5 mg three times a day for anxiety. She is also on Lamictal  and Buspar , and continues to take gabapentin for back  pain. She is bothered by drowsiness.  She is scheduled for a uterine artery embolization in October to address uterine bleeding, hoping to avoid a hysterectomy scheduled for February. She does not have fibroids.       Liver biopsy March 2025 A. LIVER, BENIGN, BIOPSY: Steatohepatitis with perisinusoidal and periportal and early bridging fibrosis Lyle stage 2-3 / 4). See comment.  COMMENT:  The lobular hepatic architecture is relatively preserved. The lobules reveal patchy macrovesicular steatosis accounting for approximately 5% of overall parenchymal volume. Ballooned hepatocytes and Mallory hyaline inclusions are identified without acidophil bodies. Patchy lobular inflammation is seen. The portal tracts do not show evidence of significant portal inflammation, interlobular bile duct injury, bile ductular reaction, ductopenia or granulomas.  Trichrome stain highlights perisinusoidal and periportal and early bridging fibrosis. Iron  stain is negative for iron  deposit.  PAS/D stains reveal ceroid laden macrophages and is negative for diagnostic cytoplasmic globules. Reticulin stain reveals an intact reticulin framework.  Overall, the histologic findings demonstrate mild steatohepatitis with perisinusoidal and periportal and early bridging fibrosis. As you know, steatohepatitis is a pattern of injury that may be seen in association with a variety of conditions, including alcohol, metabolic syndrome, obesity, medication/drug-induced injury, Wilson's disease, among others. If non-alcoholic fatty liver disease (NAFLD) is clinically favored, the histologic findings correlate to an NAFLD Activity Score (NAS) of 4 of 8 (steatosis: 1 of 3; ballooning: 2 of 2; inflammation: 1 of 3) with Kleiner stage 2-3 of 4 fibrosis.     Aug 2023 EGD for IDA, N&V --Erosive  gastropathy. Biopsied   Aug 2023 colonoscopy  Diverticulosis in ascending colon. One 8 mm rectal polyp was removed. Examined  portion of ileum was normal .No abnormalities to explain weight loss, symptoms.    Surgical [P], duodenal BENIGN DUODENAL MUCOSA WITH NO DIAGNOSTIC ABNORMALITY 2. 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 PYLORI, INTESTINAL METAPLASIA, DYSPLASIA AND CARCINOMA 3. Surgical [P], gastric body MILD CHRONIC GASTRITIS WITH REACTIVE EPITHELIAL CHANGES NEGATIVE FOR H. PYLORI, INTESTINAL METAPLASIA, DYSPLASIA AND CARCINOMA 4. Surgical [P], colon, rectum, polyp (1) TUBULAR ADENOMA NEGATIVE FOR HIGH-GRADE DYSPLASIA AND CARCINOMA       Right upper quadrant ultrasound August 11, 2023 IMPRESSION: 1. Contracted gallbladder. No cholelithiasis or sonographic evidence for acute cholecystitis. 2. Increased hepatic parenchymal echogenicity suggestive of steatosis.     Gastric emptying study August 08, 2023 FINDINGS: Expected location of the stomach in the left upper quadrant. Ingested meal empties the stomach gradually over the course of the study.   5% emptied at 1 hr ( normal >= 10%)   49% emptied at 2 hr ( normal >= 40%)   95% emptied at 3 hr ( normal >= 70%)   IMPRESSION: No convincing scintigraphic evidence of delayed gastric emptying.     Right upper quadrant ultrasound May 29, 2022 IMPRESSION: Fatty liver.   No acute findings     CT abdomen/pelvis with contrast March 02, 2022 IMPRESSION: 1. Examination is positive for acute appendicitis. No signs of perforation or abscess formation. 2. Hepatosplenomegaly   CT abdomen/pelvis with contrast February 25, 2022 IMPRESSION: 1. Abnormal appearance of the appendix suggesting acute appendicitis. Adjacent wall thickening of the base of the cecum. 2. Hepatomegaly and hepatic steatosis. 3. Splenomegaly. 4. Left renal calculus       Past Medical History:  Diagnosis Date   Alcohol abuse    history of heavy alcohol abuse, last drank alcohol in 06/2022    Anemia 2023   s/p iron  infusions   Anxiety    Follows with PCP. Dr. Jenkins Fredrickson.   Chronic kidney disease 8/23   Clotting disorder 03/12/22   Vaginal bleeding/very heavy periods   Compression fracture of body of thoracic vertebra (HCC) 11/05/2022   T4, T5, T6 / Follows w/ orthopedic, Dr. Ozell Ada. Fractures occurred while patient was having a seizure, per patient.   Cyclic vomiting syndrome    hospital admission on 05/28/22 for vomiting, follows with gastroenterologist Dr. Glendia Holt.   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 Glendia Holt.   GERD (gastroesophageal reflux disease)    takes omeprazole  daily   Heart murmur 1982   since childhood   Hyperlipidemia    11/20/22 lipid panel in Epic   Hypokalemia 10/15/2022   K+ level 2.5   Menorrhagia    Palpitations    Follows w/ cardiology, Damien Boos, NP. 11/14/2022 echocardiogram EF 60 65%, 12/31/22 Event heart monitor results in Epic.   PONV (postoperative nausea and vomiting)    Prolonged QT interval    Follows w/ cardiology, Damien Braver, NP.   Seizure (HCC) 06/06/2022   Follows with neurologist, Dr. Hulan Falling @ [facility]. Seizures were thought to be related to stopping alcohol. However, patient has had seizures months after quitting alcohol. As of 02/06/23, last seizure was on 11/07/22. Keppra  was then increased  to bid.   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 See 11/05/22 Head CT and MRI in Epic. Resolved per pt on 02/06/23.   Tremors of nervous system    whole body tremors, follows with Dr. Hulan Falling @ [facility].   Vitamin deficiency 2023   B12 deficiency, receiving monthly B12 injections / Vitamin D  deficiency, taking supplement every two weeks   Wears contact lenses    Wears  glasses      Past Surgical History:  Procedure Laterality Date   COLONOSCOPY WITH ESOPHAGOGASTRODUODENOSCOPY (EGD)  03/27/2022   one rectal polyp   LAPAROSCOPIC APPENDECTOMY N/A 03/02/2022   Procedure: APPENDECTOMY LAPAROSCOPIC;  Surgeon: Belinda Cough, MD;  Location: [facility];  Service: General;  Laterality: N/A;   ROBOTIC ASSISTED LAPAROSCOPIC HYSTERECTOMY AND SALPINGECTOMY Bilateral 03/04/2023   Procedure: ABORTED XI ROBOTIC ASSISTED LAPAROSCOPIC HYSTERECTOMY AND SALPINGECTOMY;  Surgeon: Gorge Ade, MD;  Location: [facility];  Service: Gynecology;  Laterality: Bilateral;  Requests 2/1/2 hrs.   Family History  Problem Relation Age of Onset   Breast cancer Mother    Cervical cancer Mother        ovarian   Bone cancer Mother    Breast cancer Maternal Grandmother    Colon cancer Maternal Great-grandmother 79   Esophageal cancer Neg Hx    Stomach cancer Neg Hx    Seizures Neg Hx    Stroke Neg Hx    Social History   Tobacco Use   Smoking status: Never   Smokeless tobacco: Never  Vaping Use   Vaping status: Never Used  Substance Use Topics   Alcohol use: Not Currently    Comment: History of heavy alcohol abuse. Quit in 2023. Last drank alcohol in 06/2022.   Drug use: Never   Current Outpatient Medications  Medication Sig Dispense Refill   Accu-Chek FastClix Lancets MISC 1 each by Does not apply route daily at 12 noon. 100 each 3   Blood Glucose Monitoring Suppl (ACCU-CHEK GUIDE ME) w/Device KIT 1 each by Does not apply route once for 1 dose. 1 kit 1   DULoxetine  (CYMBALTA ) 30 MG capsule Take 1 capsule (30 mg total) by mouth daily for 7 days. 7 capsule 0   DULoxetine  (CYMBALTA ) 60 MG capsule Take 1 capsule (60 mg total) by mouth daily. 30 capsule 0   gabapentin (NEURONTIN) 300 MG capsule Take 300 mg by mouth at bedtime.     Galcanezumab -gnlm (EMGALITY ) 120 MG/ML SOAJ Inject 120 mg into the skin every 30 (thirty) days. 1.12 mL 12   glucose blood (ACCU-CHEK  GUIDE TEST) test strip Check daily 100 each 12   hydrochlorothiazide  (HYDRODIURIL ) 25 MG tablet TAKE 1 TABLET BY MOUTH DAILY AS NEEDED. 90 tablet 1   hydrOXYzine  (ATARAX ) 25 MG tablet Take 1 tablet (25 mg total) by mouth 3 (three) times daily as needed. 90 tablet 0   ibuprofen  (ADVIL ) 800 MG tablet Take by mouth.     KLOR-CON  M20 20 MEQ tablet Take 2 tablets (40 mEq total) by mouth 3 (three) times daily. 540 tablet 1   lactulose  (CHRONULAC ) 10 GM/15ML solution TAKE 15ML THREE TIMES DAILY AS NEEDED 4257 mL 1   lamoTRIgine  (LAMICTAL ) 25 MG tablet Take 1 tablet (25 mg total) by mouth 2 (two) times daily. 60 tablet 1   Lancets Misc. (ACCU-CHEK FASTCLIX LANCET) KIT 1 each by Does not apply route once for 1 dose. 1 kit 1  levETIRAcetam  (KEPPRA ) 750 MG tablet Take 1 tablet (750 mg total) by mouth 2 (two) times daily. 180 tablet 3   lidocaine  (LIDODERM ) 5 % PLACE 1 PATCH ONTO THE SKIN DAILY. REMOVE & DISCARD PATCH WITHIN 12 HOURS OR AS DIRECTED BY MD 30 patch 0   melatonin 5 MG TABS Take 5 mg by mouth. 25 mg total     meloxicam (MOBIC) 15 MG tablet Take 15 mg by mouth daily.     metFORMIN (GLUCOPHAGE) 500 MG tablet Take 1 tablet (500 mg total) by mouth 2 (two) times daily with a meal. 60 tablet 1   norethindrone  (AYGESTIN ) 5 MG tablet Take 1 tablet (5 mg total) by mouth in the morning and at bedtime. 60 tablet 2   omeprazole  (PRILOSEC) 40 MG capsule Take 1 capsule (40 mg total) by mouth daily. 90 capsule 1   ondansetron  (ZOFRAN -ODT) 4 MG disintegrating tablet TAKE 1 TABLET BY MOUTH EVERY 8 HOURS AS NEEDED FOR NAUSEA AND VOMITING 20 tablet 1   oxyCODONE -acetaminophen  (PERCOCET) 10-325 MG tablet Take 1 tablet by mouth every 4 (four) hours as needed for pain.     promethazine  (PHENERGAN ) 25 MG suppository Place 1 suppository (25 mg total) rectally every 6 (six) hours as needed for nausea or vomiting. 12 each 3   promethazine  (PHENERGAN ) 25 MG tablet Take 1 tablet (25 mg total) by mouth daily. 90 tablet 0    Rimegepant Sulfate (NURTEC) 75 MG TBDP Take 1 tablet (75 mg total) by mouth daily as needed. 16 tablet 2   sertraline  (ZOLOFT ) 50 MG tablet Take 0.5 tablets (25 mg total) by mouth daily for 7 days, THEN 1 tablet (50 mg total) daily. 33.5 tablet 0   spironolactone  (ALDACTONE ) 25 MG tablet Take 1 tablet (25 mg total) by mouth daily. 90 tablet 3   tirzepatide  (MOUNJARO) 15 MG/0.5ML Pen Inject 15 mg into the skin once a week. 6 mL 1   tiZANidine (ZANAFLEX) 4 MG capsule      Vitamin D , Ergocalciferol , (DRISDOL ) 1.25 MG (50000 UNIT) CAPS capsule TAKE 1 CAPSULE (50,000 UNITS TOTAL) BY MOUTH EVERY 7 (SEVEN) DAYS 12 capsule 0   No current facility-administered medications for this visit.   No Known Allergies   Review of Systems: All systems reviewed and negative except where noted in HPI.    No results found.  Physical Exam: BP 130/72   Pulse 97   Ht 5' 7 (1.702 m)   Wt 284 lb (128.8 kg)   LMP 01/29/2023 (Approximate)   BMI 44.48 kg/m  Constitutional: Pleasant,well-developed, Caucasian female in no acute distress. HEENT: Normocephalic and atraumatic. Conjunctivae are normal. No scleral icterus. Neck supple.  Cardiovascular: Normal rate, regular rhythm.  Pulmonary/chest: Effort normal and breath sounds normal. No wheezing, rales or rhonchi. Abdominal: Obese, soft, nondistended, nontender. Bowel sounds active throughout. There are no masses palpable. No hepatomegaly. Extremities: no edema Neurological: Alert and oriented to person place and time. No asterixis. Skin: Skin is warm and dry. No rashes noted. Psychiatric: Normal mood and affect. Behavior is normal.  CBC    Component Value Date/Time   WBC 8.4 03/31/2024 0846   RBC 4.63 03/31/2024 0846   HGB 12.8 03/31/2024 0846   HGB 12.6 09/30/2023 1111   HGB 12.9 07/16/2022 0939   HCT 39.5 03/31/2024 0846   HCT 37.6 07/16/2022 0939   PLT 324.0 03/31/2024 0846   PLT 295 09/30/2023 1111   PLT 295 07/16/2022 0939   MCV 85.4  03/31/2024 0846   MCV  102 (H) 07/16/2022 0939   MCH 29.8 10/22/2023 1129   MCHC 32.4 03/31/2024 0846   RDW 14.7 03/31/2024 0846   RDW 14.2 07/16/2022 0939   LYMPHSABS 2.9 03/31/2024 0846   MONOABS 0.4 03/31/2024 0846   EOSABS 0.1 03/31/2024 0846   BASOSABS 0.1 03/31/2024 0846    CMP     Component Value Date/Time   NA 136 03/31/2024 0846   NA 137 07/16/2022 0939   K 3.9 03/31/2024 0846   CL 100 03/31/2024 0846   CO2 26 03/31/2024 0846   GLUCOSE 124 (H) 03/31/2024 0846   BUN 15 03/31/2024 0846   BUN 12 07/16/2022 0939   CREATININE 0.86 03/31/2024 0846   CREATININE 0.69 09/30/2023 1111   CREATININE 0.53 08/18/2023 1644   CALCIUM 9.4 03/31/2024 0846   PROT 8.0 03/31/2024 0846   PROT 6.3 11/20/2022 1152   ALBUMIN 4.5 03/31/2024 0846   ALBUMIN 4.3 11/20/2022 1152   AST 29 03/31/2024 0846   AST 336 (HH) 09/30/2023 1111   ALT 22 03/31/2024 0846   ALT 659 (HH) 09/30/2023 1111   ALKPHOS 50 03/31/2024 0846   BILITOT 0.5 03/31/2024 0846   BILITOT 0.9 09/30/2023 1111   GFRNONAA >60 10/22/2023 1129   GFRNONAA >60 09/30/2023 1111       Latest Ref Rng & Units 03/31/2024    8:46 AM 12/23/2023   10:05 AM 12/17/2023    2:16 PM  CBC EXTENDED  WBC 4.0 - 10.5 K/uL 8.4  9.6  8.2   RBC 3.87 - 5.11 Mil/uL 4.63  4.33  4.29   Hemoglobin 12.0 - 15.0 g/dL 87.1  88.0  88.0   HCT 36.0 - 46.0 % 39.5  37.3  36.8   Platelets 150.0 - 400.0 K/uL 324.0  332.0  306.0   NEUT# 1.4 - 7.7 K/uL 4.9  6.4  5.6   Lymph# 0.7 - 4.0 K/uL 2.9  2.5  2.0       ASSESSMENT AND PLAN:  43 year old female with alcohol-induced liver disease/MASH with early cirrhosis confirmed with liver biopsy and chronic nausea/vomiting attributed to cyclic vomiting syndrome presents for follow-up.  She is doing much better today than she has been from a GI/liver standpoint in the past 2 years.   Cold-induced liver disease, early cirrhosis Early stage cirrhosis secondary to previous alcohol use. Liver function improved with  abstinence. Liver biopsy confirmed early cirrhosis.  I applauded the patient on her ongoing abstinence, and provided encouragement that with continued abstinence as well as addressing her metabolic liver disease, her liver may be able to fully recover.  For now, I would continue monitoring the patient as if she does have cirrhosis. - Order liver ultrasound for [facility] screening. - Plan elastography study within the next year to reassess liver stiffness. - Encourage continued abstinence from alcohol. - Await labs results from earlier today (CBC, INR, CMP)  Questionable hepatic encephalopathy, improved Symptoms improved with lactulose  therapy. Brain fog decreased. - Continue lactulose  therapy for now been subjective improvement  Cyclic vomiting syndrome Symptoms improved, likely exacerbated by alcohol use and anxiety. Current medications and lifestyle changes contributing to improvement. - Elavil  discontinued by psychiatrist.  Defer to psychiatrist for management of anxiety/depression, which is the likely primary driver of her CVS. - Continue current psychiatric medications as she may help with functional GI symptoms.  Type 2 diabetes mellitus, newly diagnosed Newly diagnosed with an A1c of 7, likely related to recent significant weight gain and lifestyle changes.   Obesity with severe weight  gain Severe weight gain possibly related to lifestyle changes and cessation of alcohol use. Currently on Mounjaro, which may aid in weight loss and improve diabetes and liver health. Discussed potential GI side effects of Mounjaro and its benefits for weight loss, diabetes, and liver health. - Continue Mounjaro. - Monitor for GI side effects of Mounjaro. - Encourage dietary and exercise modifications.  Depression and anxiety Managed with psychiatric medications. Current regimen includes Zoloft , hydroxyzine , and Lamictal . Transitioning from Cymbalta  to Zoloft . Anxiety may contribute to GI  symptoms.  Recording duration: 15 minutes     Shannan Garfinkel E. Stacia, MD [facility]    Wendolyn Jenkins Jansky, MD

## 2024-05-12 NOTE — Patient Instructions (Addendum)
 Call neurology for appt for migraine  Call medicaid for counseling and gyn  Call Staci about the social worker, etc.   [facility] mamm

## 2024-05-12 NOTE — Progress Notes (Signed)
 Subjective:     Patient ID: Janice Arnold, female    DOB: November 07, 1980, 43 y.o.   MRN: [id number]  Chief Complaint  Patient presents with   Follow-up    HPI Mig 15-17/mo. Discussed the use of AI scribe software for clinical note transcription with the patient, who gave verbal consent to proceed.  History of Present Illness Janice Arnold is a 43 year old female with anxiety, depression, migraines, and diabetes who presents for medication management and follow-up.  She is currently managing her anxiety and depression with medication adjustments, transitioning from Cymbalta  to Zoloft , and starting Lamictal . Atarax  (hydroxyzine ) has been reinstituted, and she has been weaned off propranolol. She is considering restarting Buspar . She is seeking in-person counseling as her previous online therapy was ineffective and is exploring options through Medicaid for coverage and availability. Being managed by psych.  No SI  For her migraines, she has been on Emgality  for at least six months but continues to experience 15 to 17 headaches per month. Nurtec has been prescribed for breakthrough headaches, but it has not been approved due to insurance requirements. She uses ibuprofen , Tylenol , and stronger medications like Percocet, gabapentin, and a muscle relaxer for pain management. She has an upcoming appointment with a neurologist in February.  New DM- her A1c was 7.3 and that she was told she now has diabetes. She has not reported any adverse effects from the zepbound . She is considering diabetic teaching to better manage her condition and is aware of the need to monitor her blood sugar levels regularly.  She has aMAFLD/poss ETOH cirrhosis-LFT's have improved and no ETOH for 1 yr.  Seeing GI later today.  Taking Lactulose    seizures, managed with  Keppra .  She is also dealing with gynecological issues, with a procedure scheduled in radiology to address bleeding. She has a follow-up with her  gynecologist in December and is due for a Pap smear and mammogram.  Edema-taking hydrochlorothiazide  daily which drops her K and supplementing that.  B12 deficiency-due for monthly B12  Her medication list includes duloxetine , gabapentin, Emgality , hydrochlorothiazide , potassium, lactulose , Lamictal , Keppra , meloxicam, omeprazole , and spironolactone . She is managing her complex medication regimen with the help of a pharmacist and is exploring additional support through a Child psychotherapist.    Health Maintenance Due  Topic Date Due   Diabetic kidney evaluation - Urine ACR  Never done   COVID-19 Vaccine (3 - Moderna risk series) 01/04/2020   Mammogram  04/29/2020    Past Medical History:  Diagnosis Date   Alcohol abuse    history of heavy alcohol abuse, last drank alcohol in 06/2022   Anemia 2023   s/p iron  infusions   Anxiety    Follows with PCP. Dr. Jenkins Fredrickson.   Chronic kidney disease 8/23   Clotting disorder 03/12/22   Vaginal bleeding/very heavy periods   Compression fracture of body of thoracic vertebra (HCC) 11/05/2022   T4, T5, T6 / Follows w/ orthopedic, Dr. Ozell Ada. Fractures occurred while patient was having a seizure, per patient.   Cyclic vomiting syndrome    hospital admission on 05/28/22 for vomiting, follows with gastroenterologist Dr. Glendia Holt.   Diabetes [facility])    Edema    lower extremity, taking amiloride  & spironolactone , follows w/ PCP   Elevated LFTs 2023   Fatty liver due to alcoholism    Follows with gastroenterologist, Dr. Glendia Holt.   GERD (gastroesophageal reflux disease)    takes omeprazole  daily   Heart murmur  1982   since childhood   Hyperlipidemia    11/20/22 lipid panel in Epic   Hypokalemia 10/15/2022   K+ level 2.5   Menorrhagia    Palpitations    Follows w/ cardiology, Damien Boos, NP. 11/14/2022 echocardiogram EF 60 65%, 12/31/22 Event heart monitor results in Epic.   PONV (postoperative nausea and vomiting)    Prolonged QT  interval    Follows w/ cardiology, Damien Braver, NP.   Seizure (HCC) 06/06/2022   Follows with neurologist, Dr. Hulan Falling @ [facility]. Seizures were thought to be related to stopping alcohol. However, patient has had seizures months after quitting alcohol. As of 02/06/23, last seizure was on 11/07/22. Keppra  was then increased to bid.   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 See 11/05/22 Head CT and MRI in Epic. Resolved per pt on 02/06/23.   Tremors of nervous system    whole body tremors, follows with Dr. Hulan Falling @ [facility].   Vitamin deficiency 2023   B12 deficiency, receiving monthly B12 injections / Vitamin D  deficiency, taking supplement every two weeks   Wears contact lenses    Wears glasses     Past Surgical History:  Procedure Laterality Date   COLONOSCOPY WITH ESOPHAGOGASTRODUODENOSCOPY (EGD)  03/27/2022   one rectal polyp   LAPAROSCOPIC APPENDECTOMY N/A 03/02/2022   Procedure: APPENDECTOMY LAPAROSCOPIC;  Surgeon: Belinda Cough, MD;  Location: [facility];  Service: General;  Laterality: N/A;   ROBOTIC ASSISTED LAPAROSCOPIC HYSTERECTOMY AND SALPINGECTOMY Bilateral 03/04/2023   Procedure: ABORTED XI ROBOTIC ASSISTED LAPAROSCOPIC HYSTERECTOMY AND SALPINGECTOMY;  Surgeon: Gorge Ade, MD;  Location: [facility];  Service: Gynecology;  Laterality: Bilateral;  Requests 2/1/2 hrs.     Current Outpatient Medications:    Accu-Chek FastClix Lancets MISC, 1 each by Does not apply route daily at 12 noon., Disp: 100 each, Rfl: 3   Blood Glucose Monitoring Suppl (ACCU-CHEK GUIDE ME) w/Device KIT, 1 each by Does not apply route once for 1 dose., Disp: 1 kit, Rfl: 1   DULoxetine  (CYMBALTA ) 30 MG capsule, Take 1 capsule (30 mg total) by mouth daily for 7 days. (Patient not taking: Reported on 05/12/2024), Disp: 7 capsule, Rfl: 0   gabapentin (NEURONTIN) 300 MG capsule, Take 300 mg by  mouth at bedtime., Disp: , Rfl:    Galcanezumab -gnlm (EMGALITY ) 120 MG/ML SOAJ, Inject 120 mg into the skin every 30 (thirty) days., Disp: 1.12 mL, Rfl: 12   glucose blood (ACCU-CHEK GUIDE TEST) test strip, Check daily, Disp: 100 each, Rfl: 12   hydrochlorothiazide  (HYDRODIURIL ) 25 MG tablet, TAKE 1 TABLET BY MOUTH DAILY AS NEEDED., Disp: 90 tablet, Rfl: 1   hydrOXYzine  (ATARAX ) 25 MG tablet, Take 1 tablet (25 mg total) by mouth 3 (three) times daily as needed., Disp: 90 tablet, Rfl: 0   ibuprofen  (ADVIL ) 800 MG tablet, Take by mouth. (Patient not taking: Reported on 05/12/2024), Disp: , Rfl:    KLOR-CON  M20 20 MEQ tablet, Take 2 tablets (40 mEq total) by mouth 3 (three) times daily., Disp: 540 tablet, Rfl: 1   lactulose  (CHRONULAC ) 10 GM/15ML solution, TAKE THREE TIMES DAILY AS NEEDED, Disp: 4257 mL, Rfl: 1   lamoTRIgine  (LAMICTAL ) 25 MG tablet, Take 1 tablet (25 mg total) by mouth 2 (two) times daily., Disp: 60 tablet, Rfl: 1   Lancets Misc. (ACCU-CHEK FASTCLIX LANCET) KIT, 1 each by Does not apply route once for 1 dose., Disp: 1  kit, Rfl: 1   levETIRAcetam  (KEPPRA ) 750 MG tablet, Take 1 tablet (750 mg total) by mouth 2 (two) times daily., Disp: 180 tablet, Rfl: 3   lidocaine  (LIDODERM ) 5 %, PLACE 1 PATCH ONTO THE SKIN DAILY. REMOVE & DISCARD PATCH WITHIN 12 HOURS OR AS DIRECTED BY MD, Disp: 30 patch, Rfl: 0   melatonin 5 MG TABS, Take 5 mg by mouth. 25 mg total, Disp: , Rfl:    meloxicam (MOBIC) 15 MG tablet, Take 15 mg by mouth daily. (Patient not taking: Reported on 05/12/2024), Disp: , Rfl:    metFORMIN (GLUCOPHAGE) 500 MG tablet, Take 1 tablet (500 mg total) by mouth 2 (two) times daily with a meal., Disp: 60 tablet, Rfl: 1   norethindrone  (AYGESTIN ) 5 MG tablet, Take 1 tablet (5 mg total) by mouth in the morning and at bedtime., Disp: 60 tablet, Rfl: 2   omeprazole  (PRILOSEC) 40 MG capsule, Take 1 capsule (40 mg total) by mouth daily., Disp: 90 capsule, Rfl: 1   ondansetron  (ZOFRAN -ODT) 4  MG disintegrating tablet, TAKE 1 TABLET BY MOUTH EVERY 8 HOURS AS NEEDED FOR NAUSEA AND VOMITING, Disp: 20 tablet, Rfl: 1   oxyCODONE -acetaminophen  (PERCOCET) 10-325 MG tablet, Take 1 tablet by mouth every 4 (four) hours as needed for pain., Disp: , Rfl:    promethazine  (PHENERGAN ) 25 MG suppository, Place 1 suppository (25 mg total) rectally every 6 (six) hours as needed for nausea or vomiting., Disp: 12 each, Rfl: 3   promethazine  (PHENERGAN ) 25 MG tablet, Take 1 tablet (25 mg total) by mouth daily., Disp: 90 tablet, Rfl: 0   sertraline  (ZOLOFT ) 50 MG tablet, Take 0.5 tablets (25 mg total) by mouth daily for 7 days, THEN 1 tablet (50 mg total) daily., Disp: 33.5 tablet, Rfl: 0   spironolactone  (ALDACTONE ) 25 MG tablet, Take 1 tablet (25 mg total) by mouth daily., Disp: 90 tablet, Rfl: 3   tirzepatide  (MOUNJARO) 15 MG/0.5ML Pen, Inject 15 mg into the skin once a week. (Patient not taking: Reported on 05/12/2024), Disp: 6 mL, Rfl: 1   tiZANidine (ZANAFLEX) 4 MG capsule, , Disp: , Rfl:    Vitamin D , Ergocalciferol , (DRISDOL ) 1.25 MG (50000 UNIT) CAPS capsule, TAKE 1 CAPSULE (50,000 UNITS TOTAL) BY MOUTH EVERY 7 (SEVEN) DAYS (Patient not taking: Reported on 05/12/2024), Disp: 12 capsule, Rfl: 0   Rimegepant Sulfate (NURTEC) 75 MG TBDP, Take 1 tablet (75 mg total) by mouth daily as needed., Disp: 16 tablet, Rfl: 2  No Known Allergies ROS neg/noncontributory except as noted HPI/below      Objective:     BP 110/80   Pulse 88   Temp 97.9 F (36.6 C)   Ht 5' 7 (1.702 m)   Wt 282 lb (127.9 kg)   LMP 01/29/2023 (Approximate)   SpO2 98%   BMI 44.17 kg/m  Wt Readings from Last 3 Encounters:  05/12/24 284 lb (128.8 kg)  05/12/24 282 lb (127.9 kg)  04/26/24 272 lb 6.4 oz (123.6 kg)    Physical Exam   Gen: WDWN NAD HEENT: NCAT, conjunctiva not injected, sclera nonicteric NECK:  supple, no thyromegaly, no nodes, no carotid bruits CARDIAC: RRR, S1S2+, no murmur. DP 2+B LUNGS: CTAB. No  wheezes ABDOMEN:  BS+, soft, NTND, No HSM, no masses EXT:  no edema MSK: no gross abnormalities.  NEURO: A&O x3.  CN II-XII intact.  PSYCH: normal mood. Good eye contact     Assessment & Plan:  Type 2 diabetes mellitus without complication, without long-term current  use of insulin (HCC) -     Mounjaro; Inject 15 mg into the skin once a week. (Patient not taking: Reported on 05/12/2024)  Dispense: 6 mL; Refill: 1 -     Referral to Nutrition and Diabetes Services -     metFORMIN HCl; Take 1 tablet (500 mg total) by mouth 2 (two) times daily with a meal.  Dispense: 60 tablet; Refill: 1 -     Accu-Chek Guide Test; Check daily  Dispense: 100 each; Refill: 12 -     Accu-Chek Guide Me; 1 each by Does not apply route once for 1 dose.  Dispense: 1 kit; Refill: 1 -     Accu-Chek FastClix Lancet; 1 each by Does not apply route once for 1 dose.  Dispense: 1 kit; Refill: 1 -     Accu-Chek FastClix Lancets; 1 each by Does not apply route daily at 12 noon.  Dispense: 100 each; Refill: 3  Local edema  Metabolic dysfunction-associated steatotic liver disease (MASLD) -     CBC with Differential/Platelet -     Comprehensive metabolic panel with GFR -     Protime-INR -     Magnesium   Migraine without aura and without status migrainosus, not intractable -     Nurtec; Take 1 tablet (75 mg total) by mouth daily as needed.  Dispense: 16 tablet; Refill: 2  Adjustment disorder with mixed anxiety and depressed mood  Chronic migraine w/o aura w/o status migrainosus, not intractable -     Nurtec; Take 1 tablet (75 mg total) by mouth daily as needed.  Dispense: 16 tablet; Refill: 2  B12 deficiency -     Cyanocobalamin   Assessment and Plan Assessment & Plan Type 2 diabetes mellitus  - new dx Recently diagnosed with an A1c of 7.3%. Transition from Zepbound  to Mounjaro is necessary due to insurance changes. Maintaining A1c below 8% is crucial to avoid surgical complications. Metformin is considered, but  caution is advised due to potential diarrhea, especially with current lactulose  use. Switch to Mounjaro 15mg  for diabetes management. Add metformin 500mg  bid. Order a glucometer for home monitoring. She should check blood glucose before meals or two hours after meals and keep a log. Refer to diabetic teaching for education on nutrition, diet, and exercise.   Obesity   Weight management is complicated by insurance changes affecting medication coverage. Transition from Zepbound  to Mounjaro is planned due to the new diabetes diagnosis. Switch to Mounjaro for weight management in the context of diabetes.  Migraine   She experiences chronic migraines with 15-17 episodes per month despite Emgality  use. Nurtec is not approved due to insurance requirements, but documented frequency supports its approval. Schedule an appointment with [facility] for further management. Attempt to refill Nurtec with documentation of migraine frequency.  Chronic pain syndrome (back pain)   Pain is managed with gabapentin, muscle relaxers, and stronger medications for migraines. Continue the current pain management regimen. Seeing pain mgmt  Depression and anxiety   Ongoing management involves medication adjustments by Dr. Izella. Transition from Cymbalta  to Zoloft , starting Lamictal , and reinstituting Atarax . Buspar  may be added. Difficulty finding in-person counseling due to insurance and availability issues. Call Medicaid for in-person counseling options. Contact Janice Franks for assistance with Child psychotherapist and counseling coordination.  Seizure disorder   Follow-up with a neurologist is scheduled for February. Ensure follow-up with the neurologist in February.  Chronic liver disease   Medication interactions, particularly with acetaminophen , are a concern.managed by GI  General Health Maintenance  Pending gynecological procedures and screenings. A mammogram needs scheduling. Discussed a potential  gynecological procedure with a 60-70% success chance to avoid surgery due to high surgical risk. Schedule a mammogram and ensure gynecological follow-up and Pap smear with a new gynecologist.    Return in about 4 weeks (around 06/09/2024) for chronic follow-up-sch several f/u monthly.  Jenkins CHRISTELLA Carrel, MD

## 2024-05-12 NOTE — Telephone Encounter (Signed)
 Pharmacy Patient Advocate Encounter   Received notification from CoverMyMeds that prior authorization for Mounjaro 15MG /0.5ML auto-injectors  is required/requested.   Insurance verification completed.   The patient is insured through [facility] MEDICAID Key: [id number]  **Waiting for chart notes to be completed.**

## 2024-05-13 ENCOUNTER — Other Ambulatory Visit: Payer: Self-pay | Admitting: Family Medicine

## 2024-05-13 ENCOUNTER — Ambulatory Visit: Payer: Self-pay | Admitting: Family Medicine

## 2024-05-13 ENCOUNTER — Other Ambulatory Visit (HOSPITAL_COMMUNITY): Payer: Self-pay

## 2024-05-13 ENCOUNTER — Other Ambulatory Visit: Payer: Self-pay | Admitting: Pharmacist

## 2024-05-13 DIAGNOSIS — E119 Type 2 diabetes mellitus without complications: Secondary | ICD-10-CM

## 2024-05-13 LAB — CBC WITH DIFFERENTIAL/PLATELET
Basophils Absolute: 0.1 K/uL (ref 0.0–0.1)
Basophils Relative: 1.3 % (ref 0.0–3.0)
Eosinophils Absolute: 0.1 K/uL (ref 0.0–0.7)
Eosinophils Relative: 1.5 % (ref 0.0–5.0)
HCT: 37.4 % (ref 36.0–46.0)
Hemoglobin: 12.1 g/dL (ref 12.0–15.0)
Lymphocytes Relative: 28.7 % (ref 12.0–46.0)
Lymphs Abs: 2.3 K/uL (ref 0.7–4.0)
MCHC: 32.4 g/dL (ref 30.0–36.0)
MCV: 84 fl (ref 78.0–100.0)
Monocytes Absolute: 0.4 K/uL (ref 0.1–1.0)
Monocytes Relative: 4.7 % (ref 3.0–12.0)
Neutro Abs: 5.1 K/uL (ref 1.4–7.7)
Neutrophils Relative %: 63.8 % (ref 43.0–77.0)
Platelets: 339 K/uL (ref 150.0–400.0)
RBC: 4.45 Mil/uL (ref 3.87–5.11)
RDW: 13.5 % (ref 11.5–15.5)
WBC: 7.9 K/uL (ref 4.0–10.5)

## 2024-05-13 LAB — COMPREHENSIVE METABOLIC PANEL WITH GFR
ALT: 27 U/L (ref 0–35)
AST: 26 U/L (ref 0–37)
Albumin: 4.1 g/dL (ref 3.5–5.2)
Alkaline Phosphatase: 55 U/L (ref 39–117)
BUN: 11 mg/dL (ref 6–23)
CO2: 27 meq/L (ref 19–32)
Calcium: 9.7 mg/dL (ref 8.4–10.5)
Chloride: 102 meq/L (ref 96–112)
Creatinine, Ser: 0.78 mg/dL (ref 0.40–1.20)
GFR: 93.42 mL/min (ref >60.00)
Glucose, Bld: 289 mg/dL — ABNORMAL HIGH (ref 70–99)
Potassium: 4 meq/L (ref 3.5–5.1)
Sodium: 137 meq/L (ref 135–145)
Total Bilirubin: 0.5 mg/dL (ref 0.2–1.2)
Total Protein: 7 g/dL (ref 6.0–8.3)

## 2024-05-13 LAB — MAGNESIUM: Magnesium: 1.3 mg/dL — ABNORMAL LOW (ref 1.5–2.5)

## 2024-05-13 LAB — PROTIME-INR
INR: 1.1 ratio — ABNORMAL HIGH (ref 0.8–1.0)
Prothrombin Time: 11.7 s (ref 9.6–13.1)

## 2024-05-13 MED ORDER — TRULICITY 4.5 MG/0.5ML ~~LOC~~ SOAJ: 4.5000 mg | SUBCUTANEOUS | 2 refills | Status: DC

## 2024-05-13 NOTE — Progress Notes (Signed)
 05/13/2024 Name: Janice Arnold MRN: [id number] DOB: 1981/07/07  Chief Complaint  Patient presents with   Medication Management    Janice Arnold is a 43 y.o. year old female who presented for a telephone visit.   They were referred to the pharmacist by their PCP for assistance in managing medication access.    Subjective:  Care Team: Primary Care Provider: Wendolyn Jenkins Jansky, MD ; Next Scheduled Visit: 06/16/2024 Psychiatrist: Dr Izella Caraway; Next Scheduled Visit: Per patient is scheduled for 06/02/2024  for follow up but I am not able to see scheduled appointment Neurologist: Dr Gregg: Next Scheduled visit: 10/05/2024  Medication Access/Adherence  Current Pharmacy:  [id number] [address] [place], [place] - [address] AT [facility] [address] [place] [address] Phone: [phone] Fax: [phone]   Patient reports affordability concerns with their medications: No  Patient reports access/transportation concerns to their pharmacy: No  Patient reports adherence concerns with their medications:  Yes  - Waiting on prior authorization for Zepbound  and Nurtec  Patient has type 2 DM and noted to have 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 Most recent LFTs show improved liver disease, though Dr Linton noted possible symptoms of encephalopathy / brain fog this year. No ascites noted in past.  Child-Pugh Score of A.   She has been taking Zepbound  but most recent prior authorization was denied. Zepbound  is no longer on Preferred Drug List for Medicaid.  She was changed to Mounjaro 15mg  weekly but she has to try Ozmepic and / or Trulicity per Medicaid requirements.   Regarding Nurtec and migraine headaches. Patient reports she has about 2 to 5 headaches per week or 8 to 20 per month.   Current medications for migraine headaches: Emgality  120mg  once per month, Nurtec (has not been started due to insurance prior  authorization)  She has taken Nurtec in that past as an abortive therapy with success.  Patient has also previously taken propranolol and duloxetine  which did not effectively decrease migraine headaches,  Took amitriptyline  for cyclic vomiting but was stopped by her psychiatrist and changed to hydroxyzine  for anxiety.  She is taking lamotrigine , levetiracetam  and gabapentin which are all anticonvulsants that have been used to decrease the number of migraine headaches.   She experience a seizure earlier this year and was started on levetirazetam - taking 750mg  twice a day. Neurologist is Dr Gregg. Last levetiracetam  level was WNL at 15.2 on 02/06/2024  EEG completed 01/26/2024. Results: Abnormality: None Impression: This is a normal awake and drowsy EEG. No evidence of interictal epileptiform discharges. Normal EEGs, however, do not rule out epilepsy.   Patient is also seeing new psychiatrist, Dr Izella. Janice Arnold has several questions about recent medication adjustments from appointment on 04/26/2024: - Decrease Cymbalta  to 60 mg qAM and 30 mg qPM for one week -> 60 mg daily               -Labs from 03/2024 reviewed, LFTs and RFTs WNL - Lamictal  25 mg BID daily - Start Zoloft  25 mg daily for one week -> increase to 50 mg - Decrease propranolol to 20 mg daily PRN for one week then d/c  - Start Atarax  25 mg TID PRN  - Continue with therapy  Duloxetine  - Patient is down to taking only 30mg  daily of Cymbalta  / duloxetine  currently.  Lamictal  / lamotrigene - per patient she is taking 25mg  3 times a day and  is to taper up to 25mg  4 times a day soon. Sh eonly has about 27 tablets left.  Zoloft  - sertraline  - she is taking 50mg  daily currently which is correct per above instructions from Dr Izella.  Propranolol - pt has tapered off propranolol Atarax  / hydroxyzine  - patient has not been taking [facility] told her it was not the correct form for anxiety. Patient was told that she should be  taking capsules and not hydroxyzine  tablets.   . Objective:  Lab Results  Component Value Date   HGBA1C 7.3 (H) 03/31/2024    Lab Results  Component Value Date   CREATININE 0.78 05/12/2024   BUN 11 05/12/2024   NA 137 05/12/2024   K 4.0 05/12/2024   CL 102 05/12/2024   CO2 27 05/12/2024    Lab Results  Component Value Date   CHOL 172 09/17/2023   HDL 32.40 (L) 09/17/2023   LDLCALC 104 (H) 09/17/2023   TRIG 178.0 (H) 09/17/2023   CHOLHDL 5 09/17/2023    Medications Reviewed Today     Reviewed by Carla Milling, RPH-CPP (Pharmacist) on 05/13/24 at 1238  Med List Status: <None>   Medication Order Taking? Sig Documenting Provider Last Dose Status Informant  Accu-Chek FastClix Lancets MISC [id number]  1 each by Does not apply route daily at 12 noon. Wendolyn Jenkins Jansky, MD  Active   DULoxetine  (CYMBALTA ) 30 MG capsule [id number] Yes Take 1 capsule (30 mg total) by mouth daily for 7 days. Izella Ismael NOVAK, MD  Active   gabapentin (NEURONTIN) 300 MG capsule [id number] Yes Take 300 mg by mouth at bedtime.  Patient taking differently: Take 600 mg by mouth 3 (three) times daily.   [provider]  Active   Galcanezumab -gnlm (EMGALITY ) 120 MG/ML SOAJ [id number] Yes Inject 120 mg into the skin every 30 (thirty) days. Wendolyn Jenkins Jansky, MD  Active   glucose blood (ACCU-CHEK GUIDE TEST) test strip [id number]  Check daily Wendolyn Jenkins Jansky, MD  Active   hydrochlorothiazide  (HYDRODIURIL ) 25 MG tablet [id number] Yes TAKE 1 TABLET BY MOUTH DAILY AS NEEDED. Wendolyn Jenkins Jansky, MD  Active   hydrOXYzine  (ATARAX ) 25 MG tablet [id number] Yes Take 1 tablet (25 mg total) by mouth 3 (three) times daily as needed. Izella Ismael NOVAK, MD  Active   ibuprofen  (ADVIL ) 800 MG tablet [id number]  Take by mouth.  Patient not taking: Reported on 05/12/2024   [provider]  Active   KLOR-CON  M20 20 MEQ tablet [id number] Yes Take 2 tablets (40 mEq total) by mouth 3 (three) times daily. Wendolyn Jenkins Jansky, MD   Active   lactulose  (CHRONULAC ) 10 GM/15ML solution [id number] Yes TAKE THREE TIMES DAILY AS NEEDED Stacia Glendia BRAVO, MD  Active   lamoTRIgine  (LAMICTAL ) 25 MG tablet [id number] Yes Take 1 tablet (25 mg total) by mouth 2 (two) times daily. Izella Ismael NOVAK, MD  Active   levETIRAcetam  (KEPPRA ) 750 MG tablet [id number] Yes Take 1 tablet (750 mg total) by mouth 2 (two) times daily. Camara, Amadou, MD  Active   lidocaine  (LIDODERM ) 5 % [id number] Yes PLACE 1 PATCH ONTO THE SKIN DAILY. REMOVE & DISCARD PATCH WITHIN 12 HOURS OR AS DIRECTED BY MD Georgina Ozell LABOR, MD  Active   melatonin 5 MG TABS [id number] Yes Take 5 mg by mouth. 25 mg total [provider]  Active   meloxicam (MOBIC) 15 MG tablet [id number]  Take 15 mg by mouth daily.  Patient not taking: Reported on 05/12/2024  [provider]  Active   metFORMIN (GLUCOPHAGE) 500 MG tablet [id number] Yes Take 1 tablet (500 mg total) by mouth 2 (two) times daily with a meal. Wendolyn Jenkins Jansky, MD  Active   norethindrone  (AYGESTIN ) 5 MG tablet [id number] Yes Take 1 tablet (5 mg total) by mouth in the morning and at bedtime. Wendolyn Jenkins Jansky, MD  Active   omeprazole  [facility]) 40 MG capsule [id number]  Take 1 capsule (40 mg total) by mouth daily. Wendolyn Jenkins Jansky, MD  Active   ondansetron  (ZOFRAN -ODT) 4 MG disintegrating tablet [id number]  TAKE 1 TABLET BY MOUTH EVERY 8 HOURS AS NEEDED FOR NAUSEA AND VOMITING  Patient not taking: Reported on 05/13/2024   Webb, Padonda B, FNP  Active   oxyCODONE -acetaminophen  (PERCOCET) 10-325 MG tablet [id number] Yes Take 1 tablet by mouth every 4 (four) hours as needed for pain. [provider]  Active   promethazine  (PHENERGAN ) 25 MG suppository [id number] Yes Place 1 suppository (25 mg total) rectally every 6 (six) hours as needed for nausea or vomiting. Wendolyn Jenkins Jansky, MD  Active   promethazine  (PHENERGAN ) 25 MG tablet [id number] Yes Take 1 tablet (25 mg total) by mouth daily. Wendolyn Jenkins Jansky, MD   Active   Rimegepant Sulfate (NURTEC) 75 MG TBDP [id number]  Take 1 tablet (75 mg total) by mouth daily as needed.  Patient not taking: Reported on 05/13/2024   Wendolyn Jenkins Jansky, MD  Active   sertraline  (ZOLOFT ) 50 MG tablet [id number] Yes Take 0.5 tablets (25 mg total) by mouth daily for 7 days, THEN 1 tablet (50 mg total) daily. Izella Ismael NOVAK, MD  Active   spironolactone  (ALDACTONE ) 25 MG tablet [id number] Yes Take 1 tablet (25 mg total) by mouth daily. Wendolyn Jenkins Jansky, MD  Active   tirzepatide  [facility]) 15 MG/0.5ML Pen [id number]  Inject 15 mg into the skin once a week.  Patient not taking: Reported on 05/13/2024   Wendolyn Jenkins Jansky, MD  Active   tiZANidine (ZANAFLEX) 4 MG capsule [id number] Yes  [provider]  Active   Vitamin D , Ergocalciferol , (DRISDOL ) 1.25 MG (50000 UNIT) CAPS capsule [id number] Yes TAKE 1 CAPSULE (50,000 UNITS TOTAL) BY MOUTH EVERY 7 (SEVEN) DAYS Wendolyn Jenkins Jansky, MD  Active               Assessment/Plan:   Diabetes: - Currently uncontrolled; goal A1c <7%.  - Recommend change Zepbound  / Mounjaro to either Trulicity 3mg  or Ozempic 1mg  weekly due to insurance formulary restrictions. She has to try a preferred GLP1 before Medicaid will allow Mounjaro - Continue metofmrin 500mg  twice a day  Anxiety: - Reviewed recommendations from Dr Ginny last office visit. Recommended she call her office to verify instructions for lamotrigine .  - Explained that both Atarax  and Vistaril  are hydroxyzine  except Atarax  is a tablet and Vistaril  is a capsule. Either one will help with the approved indications of anxiety and itching. She can fill the current Rx of hydroxyzine  25mg  tablets and take up to 3 times a day as needed for anxiety or if she prefers that capsules she should ask Dr Ginny office to send in Rx for hydroxyzine  CAPSULES.  - Continue as planned to follow up with Dr Izella 10/20.    Migraine Headaches:  - She is still having 2 to 5 headaches per week. She  has tried is taking several preventative medications without optimal results. She has taken Nurtec in the past as an abortive migraine treatment but good success.  -  Continue Emgality  120mg  monthly. She could try Nurtec for abortive therapy or in place of Emgality  for migraine prevention. Will consult with her PCP and retry prior authorization if Dr Wendolyn would prefer. I think the previous prior authorization was denied because it was written as once a day as needed. Max dosage is every OTHER day for preventative or every OTHER day as needed for abortive therapy. Will need to monitor liver function as Nurtec is not recommended in severe liver disease (Child-Pugh C) but patient's current liver function has improved.    Madelin Ray, PharmD Clinical Pharmacist [facility]  Population Health [phone]

## 2024-05-13 NOTE — Telephone Encounter (Signed)
 Clinical questions have been answered and PA submitted. PA currently Pending. Please be advised that most companies allow up to 30 days to make a decision. We will advise when a determination has been made, or follow up in 1 week.   Please reach out to our team, Rx Prior Auth Pool, if you haven't heard back in a week.

## 2024-05-13 NOTE — Progress Notes (Signed)
 Magnesium  low-needs to take it.  Sugar was really high, start the metformin.  Start monitoring sugars.

## 2024-05-13 NOTE — Progress Notes (Signed)
 Pt had se to wegovy .  Doing well on zepbound , but now DM so changed to mounjaro, but ins needs pt to fail 2 GLP-1 meds for approval.  Spoke to pt.  Will try trulicity

## 2024-05-13 NOTE — Telephone Encounter (Signed)
 Pharmacy Patient Advocate Encounter  Received notification from [facility] MEDICAID that Prior Authorization for Mounjaro 15MG /0.5ML auto-injectors  has been DENIED.  Full denial letter will be uploaded to the media tab. See denial reason below.    PA #/Case ID/Reference #: [id number]

## 2024-05-13 NOTE — Telephone Encounter (Signed)
 It looks like they are wanting her to have tried at least 2 on this list. Besides semaglutide , has she tried any of these other one's listed?

## 2024-05-14 ENCOUNTER — Encounter: Payer: Self-pay | Admitting: Family Medicine

## 2024-05-19 ENCOUNTER — Telehealth (HOSPITAL_COMMUNITY): Payer: Self-pay | Admitting: *Deleted

## 2024-05-19 ENCOUNTER — Other Ambulatory Visit (HOSPITAL_COMMUNITY): Payer: Self-pay | Admitting: Psychiatry

## 2024-05-19 ENCOUNTER — Telehealth: Payer: Self-pay

## 2024-05-19 ENCOUNTER — Other Ambulatory Visit (HOSPITAL_COMMUNITY): Payer: Self-pay

## 2024-05-19 DIAGNOSIS — F411 Generalized anxiety disorder: Secondary | ICD-10-CM

## 2024-05-19 DIAGNOSIS — F331 Major depressive disorder, recurrent, moderate: Secondary | ICD-10-CM

## 2024-05-19 MED ORDER — LAMOTRIGINE 25 MG PO TABS: 25.0000 mg | ORAL_TABLET | Freq: Two times a day (BID) | ORAL | 0 refills | Status: DC

## 2024-05-19 MED ORDER — HYDROXYZINE PAMOATE 25 MG PO CAPS: 25.0000 mg | ORAL_CAPSULE | Freq: Three times a day (TID) | ORAL | 0 refills | Status: DC | PRN

## 2024-05-19 NOTE — Telephone Encounter (Signed)
 Pharmacy Patient Advocate Encounter   Received notification from Pt Calls Messages that prior authorization for Trulicity 4.5MG /0.5ML auto-injectors is required/requested.   Insurance verification completed.   The patient is insured through [facility] MEDICAID.   Per test claim: PA required; PA submitted to above mentioned insurance via Latent Key/confirmation #/EOC [facility] Status is pending

## 2024-05-19 NOTE — Telephone Encounter (Signed)
 Pharmacy Patient Advocate Encounter  Received notification from [facility] MEDICAID that Prior Authorization for Trulicity 4.5MG /0.5ML auto-injectors  has been APPROVED from 05/19/2024 to 05/19/2025. Ran test claim, Copay is $4.00. This test claim was processed through [facility]- copay amounts may vary at other pharmacies due to pharmacy/plan contracts, or as the patient moves through the different stages of their insurance plan.   PA #/Case ID/Reference #: [id number]

## 2024-05-19 NOTE — Telephone Encounter (Signed)
 Copied from CRM #[id number]. Topic: Clinical - Medication Prior Auth >> May 19, 2024  9:47 AM [place] J wrote: Reason for CRM: Patient is needing a prior authorization sent in for her dulaglutide (TRULICITY) 4.5 MG/0.5ML SOAJ per pharmacy.   Sending to PA team.

## 2024-05-19 NOTE — Telephone Encounter (Signed)
 Pt called a second time requesting refill of the Lamictal  25 mg BID. Pt states that she knows she is supposed to increase dose and so has been taking 4 tabs every day and now is completely out of this medication.

## 2024-05-19 NOTE — Telephone Encounter (Signed)
 Pt called requesting Hydroxyzine  25 mg tabs be sent as (Vistaril ) Pamoate capsules instead. Pt also requests a script for Baclofen as she says was discussed during 04/26/24 visit. Pt has a f/u scheduled for 05/31/24.

## 2024-05-19 NOTE — Progress Notes (Signed)
 Received refill request from pharmacy for this patient's Lamictal  and hydroxyzine . I sent in the medication to bridge patient for the next appointment on 10/20.  Ismael Franco, MD PGY-3 Psychiatry Resident

## 2024-05-23 ENCOUNTER — Other Ambulatory Visit: Payer: Self-pay | Admitting: Family Medicine

## 2024-05-23 DIAGNOSIS — Z419 Encounter for procedure for purposes other than remedying health state, unspecified: Secondary | ICD-10-CM | POA: Diagnosis not present

## 2024-05-23 DIAGNOSIS — G43709 Chronic migraine without aura, not intractable, without status migrainosus: Secondary | ICD-10-CM

## 2024-05-23 DIAGNOSIS — G43009 Migraine without aura, not intractable, without status migrainosus: Secondary | ICD-10-CM

## 2024-05-23 MED ORDER — NURTEC 75 MG PO TBDP: 75.0000 mg | ORAL_TABLET | ORAL | 2 refills | Status: DC | PRN

## 2024-05-24 ENCOUNTER — Telehealth: Payer: Self-pay

## 2024-05-24 ENCOUNTER — Ambulatory Visit (HOSPITAL_COMMUNITY): Admission: RE | Admit: 2024-05-24 | Source: Ambulatory Visit
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
## 2024-05-24 NOTE — Telephone Encounter (Signed)
 Pharmacy Patient Advocate Encounter   Received notification from CoverMyMeds that prior authorization for Nurtec 75MG  dispersible tablets is required/requested.   Insurance verification completed.   The patient is insured through [facility] MEDICAID.   Per test claim: PA required; PA submitted to above mentioned insurance via Latent Key/confirmation #/EOC [id number] Status is pending

## 2024-05-25 ENCOUNTER — Other Ambulatory Visit (HOSPITAL_COMMUNITY): Payer: Self-pay

## 2024-05-25 DIAGNOSIS — Z6841 Body Mass Index (BMI) 40.0 and over, adult: Secondary | ICD-10-CM | POA: Diagnosis not present

## 2024-05-25 DIAGNOSIS — Z79899 Other long term (current) drug therapy: Secondary | ICD-10-CM | POA: Diagnosis not present

## 2024-05-25 DIAGNOSIS — R030 Elevated blood-pressure reading, without diagnosis of hypertension: Secondary | ICD-10-CM | POA: Diagnosis not present

## 2024-05-25 DIAGNOSIS — M549 Dorsalgia, unspecified: Secondary | ICD-10-CM | POA: Diagnosis not present

## 2024-05-25 NOTE — Telephone Encounter (Signed)
 Pharmacy Patient Advocate Encounter  Received notification from [facility] MEDICAID that Prior Authorization for Nurtec 75MG  dispersible tablets  has been DENIED.  Full denial letter will be uploaded to the media tab.   PA #/Case ID/Reference #: [id number]

## 2024-05-27 ENCOUNTER — Ambulatory Visit (HOSPITAL_COMMUNITY): Admitting: Psychiatry

## 2024-05-28 DIAGNOSIS — Z79899 Other long term (current) drug therapy: Secondary | ICD-10-CM | POA: Diagnosis not present

## 2024-05-31 ENCOUNTER — Telehealth: Payer: Self-pay | Admitting: Pharmacist

## 2024-05-31 ENCOUNTER — Encounter: Payer: Self-pay | Admitting: Family Medicine

## 2024-05-31 ENCOUNTER — Ambulatory Visit (HOSPITAL_COMMUNITY): Admitting: Psychiatry

## 2024-05-31 ENCOUNTER — Other Ambulatory Visit: Payer: Self-pay | Admitting: Family Medicine

## 2024-05-31 VITALS — BP 135/79 | Wt 281.4 lb

## 2024-05-31 DIAGNOSIS — F331 Major depressive disorder, recurrent, moderate: Secondary | ICD-10-CM

## 2024-05-31 DIAGNOSIS — Z5181 Encounter for therapeutic drug level monitoring: Secondary | ICD-10-CM | POA: Diagnosis not present

## 2024-05-31 DIAGNOSIS — G4700 Insomnia, unspecified: Secondary | ICD-10-CM | POA: Insufficient documentation

## 2024-05-31 DIAGNOSIS — F411 Generalized anxiety disorder: Secondary | ICD-10-CM

## 2024-05-31 DIAGNOSIS — E119 Type 2 diabetes mellitus without complications: Secondary | ICD-10-CM

## 2024-05-31 MED ORDER — LAMOTRIGINE 25 MG PO TABS
25.0000 mg | ORAL_TABLET | Freq: Two times a day (BID) | ORAL | 1 refills | Status: DC
Start: 2024-05-31 — End: 2024-06-17

## 2024-05-31 MED ORDER — TRAZODONE HCL 50 MG PO TABS: 50.0000 mg | ORAL_TABLET | Freq: Every evening | ORAL | 1 refills | Status: DC | PRN

## 2024-05-31 MED ORDER — TIRZEPATIDE 15 MG/0.5ML ~~LOC~~ SOAJ
15.0000 mg | SUBCUTANEOUS | 1 refills | Status: AC
Start: 2024-05-31 — End: ?

## 2024-05-31 MED ORDER — SERTRALINE HCL 100 MG PO TABS: 100.0000 mg | ORAL_TABLET | Freq: Every day | ORAL | 1 refills | Status: DC

## 2024-05-31 MED ORDER — HYDROXYZINE PAMOATE 25 MG PO CAPS: 50.0000 mg | ORAL_CAPSULE | Freq: Three times a day (TID) | ORAL | 1 refills | Status: DC | PRN

## 2024-05-31 NOTE — Progress Notes (Signed)
 Itching to trulicity.  Reaction to wegovy  in past.  Did fine on zepbound .  Now has DM so will do mounjaro.  Will need PA again

## 2024-05-31 NOTE — Telephone Encounter (Signed)
 The following requirements need to be met for patient to qualify for abortive therapy with Nurtec:   Office visit notes from 05/12/2024 states: For her migraines, she has been on Emgality  for at least six months but continues to experience 15 to 17 headaches per month.   Please advise.  Thank you, Devere Pandy, PharmD Clinical Pharmacist  [place]  Direct Dial: [phone]

## 2024-05-31 NOTE — Addendum Note (Signed)
 Addended by: CARVIN CROCK on: 05/31/2024 11:15 AM   Modules accepted: Level of Service

## 2024-06-01 ENCOUNTER — Ambulatory Visit (HOSPITAL_COMMUNITY): Admission: RE | Admit: 2024-06-01

## 2024-06-01 NOTE — Telephone Encounter (Signed)
 Pt unavailable, LVM requesting callback for pre procedure information. Mychart message sent.

## 2024-06-02 ENCOUNTER — Other Ambulatory Visit (HOSPITAL_COMMUNITY)

## 2024-06-03 DIAGNOSIS — D259 Leiomyoma of uterus, unspecified: Secondary | ICD-10-CM | POA: Diagnosis not present

## 2024-06-03 HISTORY — PX: UTERINE ARTERY EMBOLIZATION: SHX2629

## 2024-06-04 ENCOUNTER — Other Ambulatory Visit (HOSPITAL_COMMUNITY)

## 2024-06-04 ENCOUNTER — Other Ambulatory Visit: Payer: Self-pay | Admitting: Family Medicine

## 2024-06-04 DIAGNOSIS — E119 Type 2 diabetes mellitus without complications: Secondary | ICD-10-CM

## 2024-06-07 ENCOUNTER — Telehealth: Payer: Self-pay

## 2024-06-07 ENCOUNTER — Other Ambulatory Visit: Payer: Self-pay | Admitting: Family Medicine

## 2024-06-07 ENCOUNTER — Other Ambulatory Visit (HOSPITAL_COMMUNITY): Payer: Self-pay

## 2024-06-07 DIAGNOSIS — E559 Vitamin D deficiency, unspecified: Secondary | ICD-10-CM

## 2024-06-07 NOTE — Telephone Encounter (Signed)
 Pharmacy Patient Advocate Encounter   Received notification from RX Request Messages that prior authorization for Mounjaro 15MG /0.5ML auto-injectors is required/requested.   Insurance verification completed.   The patient is insured through [employer].   Per test claim: PA required; PA submitted to above mentioned insurance via Latent Key/confirmation #/EOC [id number] Status is pending

## 2024-06-08 ENCOUNTER — Other Ambulatory Visit (HOSPITAL_COMMUNITY): Payer: Self-pay | Admitting: Psychiatry

## 2024-06-08 DIAGNOSIS — F411 Generalized anxiety disorder: Secondary | ICD-10-CM

## 2024-06-08 NOTE — Telephone Encounter (Signed)
 Pharmacy Patient Advocate Encounter  Received notification from [facility] [employer] that Prior Authorization for  Mounjaro 15MG /0.5ML auto-injectors  has been APPROVED from 06/07/24 to 06/07/25   PA #/Case ID/Reference #: [id number]

## 2024-06-09 ENCOUNTER — Ambulatory Visit (HOSPITAL_COMMUNITY): Admission: RE | Admit: 2024-06-09 | Source: Ambulatory Visit

## 2024-06-09 ENCOUNTER — Encounter: Payer: Self-pay | Admitting: Gastroenterology

## 2024-06-10 ENCOUNTER — Ambulatory Visit (HOSPITAL_COMMUNITY): Admitting: Psychiatry

## 2024-06-10 ENCOUNTER — Other Ambulatory Visit (HOSPITAL_COMMUNITY): Payer: Self-pay

## 2024-06-14 ENCOUNTER — Encounter: Payer: Self-pay | Admitting: Radiology

## 2024-06-14 DIAGNOSIS — F419 Anxiety disorder, unspecified: Secondary | ICD-10-CM | POA: Diagnosis not present

## 2024-06-16 ENCOUNTER — Ambulatory Visit: Admitting: Family Medicine

## 2024-06-17 ENCOUNTER — Ambulatory Visit (INDEPENDENT_AMBULATORY_CARE_PROVIDER_SITE_OTHER): Admitting: Psychiatry

## 2024-06-17 DIAGNOSIS — F331 Major depressive disorder, recurrent, moderate: Secondary | ICD-10-CM

## 2024-06-17 DIAGNOSIS — F411 Generalized anxiety disorder: Secondary | ICD-10-CM | POA: Diagnosis not present

## 2024-06-17 MED ORDER — LAMOTRIGINE 25 MG PO TABS
50.0000 mg | ORAL_TABLET | Freq: Two times a day (BID) | ORAL | 0 refills | Status: DC
Start: 2024-06-17 — End: 2024-07-07

## 2024-06-17 NOTE — Progress Notes (Signed)
 [facility] [address] [place] [address] Dept: [phone] Dept Fax: [phone]  Psychotherapy Progress Note  Patient ID: Janice Arnold, female  DOB: [birth year], 43 y.o.  MRN: [id number]  06/17/2024 Start time: 8 AM End time: 9 AM  Method of Visit: Face-to-Face  Present: patient  Current Concerns: anxiety, irritability  Current Symptoms: Anxiety  Psychiatric Specialty Exam: General Appearance: appears at stated age, casually dressed and groomed   Behavior: pleasant and cooperative   Psychomotor Activity: no psychomotor agitation or retardation noted   Eye Contact: fair  Speech: normal amount, volume and fluency    Mood: anxious  Affect: congruent, pleasant and interactive   Thought Process: linear, goal directed, no circumstantial or tangential thought process noted, no racing thoughts or flight of ideas  Descriptions of Associations: intact   Thought Content Hallucinations: denies AH, VH , does not appear responding to stimuli  Delusions: no paranoia, delusions of control, grandeur, ideas of reference, thought broadcasting, and magical thinking  Suicidal Thoughts: denies SI, intention, plan  Homicidal Thoughts: denies HI, intention, plan   Alertness/Orientation: alert and fully oriented   Insight: fair Judgment: fair  Memory: intact   Executive Functions  Concentration: intact  Attention Span: fair  Recall: intact  Fund of Knowledge: fair   Physical Exam  General: Pleasant, well-appearing. No acute distress. Pulmonary: Normal effort. No wheezing or rales. Skin: No obvious rash or lesions. Neuro: A&Ox3.No focal deficit.  Review of Systems  No reported symptoms   Diagnosis: GAD  Anticipated Frequency of Visits: every other week Anticipated Length of Treatment Episode: 6 months  Short Term Goals/Goals for Treatment Session:   Anxiety 1) decrease anxiety 2) resist flight/freeze  response 3) identify anxiety inducing thoughts 4) use relaxation strategies (deep breathing, visualization, cognitive cueing, muscle relaxation)   Diagnosis clarity 1) meets criteria for BPD 2) does not meet criteria for PTSD  Discussed DBT pillars and foundation of CBT Patient requested aid for her anxiety. Pt has been on Lamictal  25 mg BID since September, discussed increasing the dose to 50 mg BID to aid with mood stability. Discussed closely monitoring for rash. Her next MM appt is on 11/24.  Progress Towards Goals: Initial  Treatment Intervention: Cognitive Behavioral therapy  Medical Necessity: Improved patient condition  Assessment Tools:    04/14/2024   10:43 AM 08/07/2023   11:04 AM 07/02/2023   11:21 AM  Depression screen PHQ 2/9  Decreased Interest 2 2 1   Down, Depressed, Hopeless 2 1 1   PHQ - 2 Score 4 3 2   Altered sleeping 3 3 3   Tired, decreased energy 3 2 3   Change in appetite 3 3 3   Feeling bad or failure about yourself  2 0 1  Trouble concentrating 3 2 3   Moving slowly or fidgety/restless 2 0 0  Suicidal thoughts 0 0 0  PHQ-9 Score 20 13 15   Difficult doing work/chores Somewhat difficult Extremely dIfficult Extremely dIfficult   Failed to redirect to the Timeline version of the REVFS SmartLink. Flowsheet Row US  [facility] from 09/30/2023 in [facility] ED from 06/27/2023 in [facility] at [facility]  C-SSRS RISK CATEGORY No Risk No Risk No Risk    Patient/Guardian was advised Release of Information must be obtained prior to any record release in order to collaborate their care with an outside provider. Patient/Guardian was advised  if they have not already done so to contact the registration department to sign all necessary forms in order for us  to release information regarding their care.   Plan: f/u on 12/11, work on  relaxation techniques such as deep breathing exercise paired with cognitive exercise, paired muscle tension and relaxation, and temperature changing  Ismael KATHEE Franco, MD 06/17/2024
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
## 2024-06-17 NOTE — Addendum Note (Signed)
 Addended by: IZELLA CARAWAY B on: 06/17/2024 11:56 AM   Modules accepted: Level of Service

## 2024-06-18 ENCOUNTER — Encounter: Payer: Self-pay | Admitting: Pharmacist

## 2024-06-18 ENCOUNTER — Ambulatory Visit: Admitting: Family Medicine

## 2024-06-18 ENCOUNTER — Encounter: Payer: Self-pay | Admitting: Family Medicine

## 2024-06-18 VITALS — BP 114/62 | HR 80 | Temp 97.2°F | Ht 67.0 in | Wt 276.0 lb

## 2024-06-18 DIAGNOSIS — E538 Deficiency of other specified B group vitamins: Secondary | ICD-10-CM | POA: Diagnosis not present

## 2024-06-18 DIAGNOSIS — Z794 Long term (current) use of insulin: Secondary | ICD-10-CM

## 2024-06-18 DIAGNOSIS — Z6841 Body Mass Index (BMI) 40.0 and over, adult: Secondary | ICD-10-CM | POA: Diagnosis not present

## 2024-06-18 DIAGNOSIS — E119 Type 2 diabetes mellitus without complications: Secondary | ICD-10-CM | POA: Diagnosis not present

## 2024-06-18 DIAGNOSIS — R030 Elevated blood-pressure reading, without diagnosis of hypertension: Secondary | ICD-10-CM | POA: Diagnosis not present

## 2024-06-18 DIAGNOSIS — Z7984 Long term (current) use of oral hypoglycemic drugs: Secondary | ICD-10-CM

## 2024-06-18 DIAGNOSIS — S22000A Wedge compression fracture of unspecified thoracic vertebra, initial encounter for closed fracture: Secondary | ICD-10-CM | POA: Diagnosis not present

## 2024-06-18 LAB — COMPREHENSIVE METABOLIC PANEL WITH GFR
ALT: 47 U/L — ABNORMAL HIGH (ref 0–35)
AST: 24 U/L (ref 0–37)
Albumin: 4.4 g/dL (ref 3.5–5.2)
Alkaline Phosphatase: 60 U/L (ref 39–117)
BUN: 20 mg/dL (ref 6–23)
CO2: 24 meq/L (ref 19–32)
Calcium: 9.2 mg/dL (ref 8.4–10.5)
Chloride: 101 meq/L (ref 96–112)
Creatinine, Ser: 0.72 mg/dL (ref 0.40–1.20)
GFR: 102.77 mL/min (ref >60.00)
Glucose, Bld: 120 mg/dL — ABNORMAL HIGH (ref 70–99)
Potassium: 4.1 meq/L (ref 3.5–5.1)
Sodium: 138 meq/L (ref 135–145)
Total Bilirubin: 0.6 mg/dL (ref 0.2–1.2)
Total Protein: 6.8 g/dL (ref 6.0–8.3)

## 2024-06-18 LAB — TSH: TSH: 1.87 u[IU]/mL (ref 0.35–5.50)

## 2024-06-18 MED ORDER — FREESTYLE LIBRE 3 PLUS SENSOR MISC: 3 refills | Status: AC

## 2024-06-18 MED ORDER — NOVOLOG FLEXPEN 100 UNIT/ML ~~LOC~~ SOPN: 10.0000 [IU] | PEN_INJECTOR | Freq: Three times a day (TID) | SUBCUTANEOUS | 11 refills | Status: AC

## 2024-06-18 MED ORDER — LANTUS SOLOSTAR 100 UNIT/ML ~~LOC~~ SOPN: 30.0000 [IU] | PEN_INJECTOR | Freq: Every day | SUBCUTANEOUS | 99 refills | Status: AC

## 2024-06-18 MED ORDER — PEN NEEDLES 31G X 5 MM MISC: 1.0000 | Freq: Four times a day (QID) | 3 refills | Status: AC | PRN

## 2024-06-18 MED ORDER — CYANOCOBALAMIN 1000 MCG/ML IJ SOLN
1000.0000 ug | Freq: Once | INTRAMUSCULAR | Status: AC
  Administered 2024-06-18: 1000 ug via INTRAMUSCULAR

## 2024-06-18 MED ORDER — METFORMIN HCL 500 MG PO TABS: 1000.0000 mg | ORAL_TABLET | Freq: Two times a day (BID) | ORAL | 0 refills | Status: DC

## 2024-06-18 NOTE — Progress Notes (Signed)
 Subjective:     Patient ID: Janice Arnold, female    DOB: [birth year], 43 y.o.   MRN: [id number]  No chief complaint on file.   Discussed the use of AI scribe software for clinical note transcription with the patient, who gave verbal consent to proceed.  History of Present Illness ANTONINETTE Arnold is a 43 year old female with diabetes who presents with uncontrolled blood sugar levels.  She has been experiencing uncontrolled blood sugar levels, consistently measuring in the 200s, with variations from 187 to 290. She checks her blood sugar in the morning before eating and at night after meals, noting higher readings in the evening. Despite dietary changes, her blood sugar remains elevated. She is currently taking metformin 500 mg twice daily and Mounjaro 15 mg, which she tolerates well. She previously tried It Trainer but experienced a rash with Trulicity, which is documented as an allergy.  She experiences constant fatigue, nausea, and headaches, which she feels are worsening. She has a history of vomiting that 'comes and goes' and has been ongoing for a long time. She is concerned about the implications of her high blood sugar and is anxious about her condition.  She has a history of seizures occurring every three to four months, which prevent her from driving. She is scheduled to see her seizure doctor before Thanksgiving.  She has lost ten pounds recently, which she notes as a positive change. She is scheduled to meet with a nutritionist and a behavioral health specialist.    Health Maintenance Due  Topic Date Due   Diabetic kidney evaluation - Urine ACR  Never done   Mammogram  04/29/2020    Past Medical History:  Diagnosis Date   Alcohol abuse    history of heavy alcohol abuse, last drank alcohol in 06/2022   Anemia 2023   s/p iron  infusions   Anxiety    Follows with PCP. Dr. Jenkins Fredrickson.   Chronic kidney disease 8/23   Clotting disorder 03/12/22   Vaginal  bleeding/very heavy periods   Compression fracture of body of thoracic vertebra (HCC) 11/05/2022   T4, T5, T6 / Follows w/ orthopedic, Dr. Ozell Ada. Fractures occurred while patient was having a seizure, per patient.   Cyclic vomiting syndrome    hospital admission on 05/28/22 for vomiting, follows with gastroenterologist Dr. Glendia Holt.   Diabetes [facility])    Edema    lower extremity, taking amiloride  & spironolactone , follows w/ PCP   Elevated LFTs 2023   Fatty liver due to alcoholism    Follows with gastroenterologist, Dr. Glendia Holt.   GERD (gastroesophageal reflux disease)    takes omeprazole  daily   Heart murmur 1982   since childhood   Hyperlipidemia    11/20/22 lipid panel in Epic   Hypokalemia 10/15/2022   K+ level 2.5   Menorrhagia    Palpitations    Follows w/ cardiology, Damien Boos, NP. 11/14/2022 echocardiogram EF 60 65%, 12/31/22 Event heart monitor results in Epic.   PONV (postoperative nausea and vomiting)    Prolonged QT interval    Follows w/ cardiology, Damien Braver, NP.   Seizure (HCC) 06/06/2022   Follows with neurologist, Dr. Hulan Falling @ [facility]. Seizures were thought to be related to stopping alcohol. However, patient has had seizures months after quitting alcohol. As of 02/06/23, last seizure was on 11/07/22. Keppra  was then increased to bid.   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 See 11/05/22 Head CT and MRI in Epic. Resolved per pt on 02/06/23.   Tremors of nervous system    whole body tremors, follows with Dr. Hulan Falling @ [facility].   Vitamin deficiency 2023   B12 deficiency, receiving monthly B12 injections / Vitamin D  deficiency, taking supplement every two weeks   Wears contact lenses    Wears glasses     Past Surgical History:  Procedure Laterality Date   COLONOSCOPY WITH ESOPHAGOGASTRODUODENOSCOPY (EGD)  03/27/2022   one rectal polyp    LAPAROSCOPIC APPENDECTOMY N/A 03/02/2022   Procedure: APPENDECTOMY LAPAROSCOPIC;  Surgeon: Belinda Cough, MD;  Location: [facility];  Service: General;  Laterality: N/A;   ROBOTIC ASSISTED LAPAROSCOPIC HYSTERECTOMY AND SALPINGECTOMY Bilateral 03/04/2023   Procedure: ABORTED XI ROBOTIC ASSISTED LAPAROSCOPIC HYSTERECTOMY AND SALPINGECTOMY;  Surgeon: Gorge Ade, MD;  Location: [facility];  Service: Gynecology;  Laterality: Bilateral;  Requests 2/1/2 hrs.     Current Outpatient Medications:    Accu-Chek FastClix Lancets MISC, 1 each by Does not apply route daily at 12 noon., Disp: 100 each, Rfl: 3   Continuous Glucose Sensor (FREESTYLE LIBRE 3 PLUS SENSOR) MISC, Change sensor every 15 days., Disp: 6 each, Rfl: 3   gabapentin (NEURONTIN) 300 MG capsule, Take 300 mg by mouth at bedtime., Disp: , Rfl:    Galcanezumab -gnlm (EMGALITY ) 120 MG/ML SOAJ, Inject 120 mg into the skin every 30 (thirty) days., Disp: 1.12 mL, Rfl: 12   glucose blood (ACCU-CHEK GUIDE TEST) test strip, Check daily, Disp: 100 each, Rfl: 12   hydrochlorothiazide  (HYDRODIURIL ) 25 MG tablet, TAKE 1 TABLET BY MOUTH DAILY AS NEEDED., Disp: 90 tablet, Rfl: 1   hydrOXYzine  (VISTARIL ) 25 MG capsule, Take 2 capsules (50 mg total) by mouth 3 (three) times daily as needed., Disp: 180 capsule, Rfl: 0   insulin aspart (NOVOLOG FLEXPEN) 100 UNIT/ML FlexPen, Inject 10 Units into the skin 3 (three) times daily with meals., Disp: 15 mL, Rfl: 11   insulin glargine (LANTUS SOLOSTAR) 100 UNIT/ML Solostar Pen, Inject 30 Units into the skin daily., Disp: 15 mL, Rfl: PRN   Insulin Pen Needle (PEN NEEDLES) 31G X 5 MM MISC, 1 each by Does not apply route 4 (four) times daily as needed. Dispense based on patient and insurance preference. Use up to four times daily as directed. (FOR ICD-10 E10.9, E11.9)., Disp: 400 each, Rfl: 3   KLOR-CON  M20 20 MEQ tablet, Take 2 tablets (40 mEq total) by mouth 3 (three) times daily., Disp: 540 tablet, Rfl: 1    lactulose  (CHRONULAC ) 10 GM/15ML solution, TAKE THREE TIMES DAILY AS NEEDED, Disp: 4257 mL, Rfl: 1   lamoTRIgine  (LAMICTAL ) 25 MG tablet, Take 2 tablets (50 mg total) by mouth 2 (two) times daily., Disp: 120 tablet, Rfl: 0   levETIRAcetam  (KEPPRA ) 750 MG tablet, Take 1 tablet (750 mg total) by mouth 2 (two) times daily., Disp: 180 tablet, Rfl: 3   lidocaine  (LIDODERM ) 5 %, PLACE 1 PATCH ONTO THE SKIN DAILY. REMOVE & DISCARD PATCH WITHIN 12 HOURS OR AS DIRECTED BY MD, Disp: 30 patch, Rfl: 0   norethindrone  (AYGESTIN ) 5 MG tablet, Take 1 tablet (5 mg total) by mouth in the morning and at bedtime., Disp: 60 tablet, Rfl: 2   omeprazole  (PRILOSEC) 40 MG capsule, Take 1 capsule (40 mg total) by mouth daily., Disp: 90 capsule, Rfl: 1   ondansetron  (ZOFRAN -ODT) 4 MG disintegrating tablet, TAKE 1 TABLET BY MOUTH EVERY 8 HOURS AS  NEEDED FOR NAUSEA AND VOMITING, Disp: 20 tablet, Rfl: 1   promethazine  (PHENERGAN ) 25 MG suppository, Place 1 suppository (25 mg total) rectally every 6 (six) hours as needed for nausea or vomiting., Disp: 12 each, Rfl: 3   promethazine  (PHENERGAN ) 25 MG tablet, Take 1 tablet (25 mg total) by mouth daily., Disp: 90 tablet, Rfl: 0   sertraline  (ZOLOFT ) 100 MG tablet, Take 1 tablet (100 mg total) by mouth daily., Disp: 30 tablet, Rfl: 1   spironolactone  (ALDACTONE ) 25 MG tablet, Take 1 tablet (25 mg total) by mouth daily., Disp: 90 tablet, Rfl: 3   tirzepatide  (MOUNJARO) 15 MG/0.5ML Pen, Inject 15 mg into the skin once a week., Disp: 6 mL, Rfl: 1   tiZANidine (ZANAFLEX) 4 MG capsule, , Disp: , Rfl:    traZODone (DESYREL) 50 MG tablet, Take 1 tablet (50 mg total) by mouth at bedtime as needed for sleep., Disp: 30 tablet, Rfl: 1   Vitamin D , Ergocalciferol , (DRISDOL ) 1.25 MG (50000 UNIT) CAPS capsule, TAKE 1 CAPSULE (50,000 UNITS TOTAL) BY MOUTH EVERY 7 (SEVEN) DAYS, Disp: 12 capsule, Rfl: 0   metFORMIN (GLUCOPHAGE) 500 MG tablet, Take 2 tablets (1,000 mg total) by mouth 2 (two)  times daily with a meal., Disp: 180 tablet, Rfl: 0  Allergies  Allergen Reactions   Trulicity [Dulaglutide] Itching   ROS neg/noncontributory except as noted HPI/below      Objective:     BP 114/62 (BP Location: Left Arm, Patient Position: Sitting)   Pulse 80   Temp (!) 97.2 F (36.2 C) (Temporal)   Ht 5' 7 (1.702 m)   Wt 276 lb (125.2 kg)   LMP 01/29/2023 (Approximate)   SpO2 97%   BMI 43.23 kg/m  Wt Readings from Last 3 Encounters:  06/18/24 276 lb (125.2 kg)  05/12/24 284 lb (128.8 kg)  05/12/24 282 lb (127.9 kg)    Physical Exam GENERAL: Well developed well nourished no acute distress HEAD EYES EARS NOSE THROAT: Normocephalic atraumatic, conjunctiva not injected, sclera nonicteric CARDIAC: Regular rate and rhythm, S1 S2 present , no murmur, dorsalis pedis 2 plus bilaterally NECK: Supple, no thyromegaly, no nodes, no carotid bruits LUNGS: Clear to auscultation bilaterally, no wheezes ABDOMEN: Bowel sounds present, soft, non tender non distended, no hepatosplenomegaly, no masses EXTREMITIES: No edema MUSCULOSKELETAL: No gross abnormalities NEUROLOGICAL: Alert and oriented x3, cranial nerves II through XII intact PSYCHIATRIC: Normal mood, good eye contact Reviewed labs   I personally spent a total of 45 minutes in the care of the patient today including preparing to see the patient, getting/reviewing separately obtained history, performing a medically appropriate exam/evaluation, counseling and educating, placing orders, referring and communicating with other health care professionals, documenting clinical information in the EHR, independently interpreting results, and communicating results.       Assessment & Plan:  Type 2 diabetes mellitus without complication, without long-term current use of insulin (HCC) -     Anti-islet cell antibody -     C-peptide -     Glutamic acid decarboxylase auto abs -     Insulin antibodies, blood -     Ambulatory referral to  Endocrinology -     Comprehensive metabolic panel with GFR -     TSH -     metFORMIN HCl; Take 2 tablets (1,000 mg total) by mouth 2 (two) times daily with a meal.  Dispense: 180 tablet; Refill: 0 -     Lantus SoloStar; Inject 30 Units into the skin daily.  Dispense: 15  mL; Refill: PRN -     Pen Needles; 1 each by Does not apply route 4 (four) times daily as needed. Dispense based on patient and insurance preference. Use up to four times daily as directed. (FOR ICD-10 E10.9, E11.9).  Dispense: 400 each; Refill: 3 -     NovoLOG FlexPen; Inject 10 Units into the skin 3 (three) times daily with meals.  Dispense: 15 mL; Refill: 11 -     FreeStyle Libre 3 Plus Sensor; Change sensor every 15 days.  Dispense: 6 each; Refill: 3  B12 deficiency -     Cyanocobalamin     Assessment and Plan Assessment & Plan Type 2 diabetes mellitus with hyperglycemia   Persistent hyperglycemia with blood glucose levels consistently in the 200s indicates poor glycemic control. Previous trials of Trulicity and Ozempic caused adverse reactions, including a rash with Trulicity. She currently tolerates metformin 500 mg twice daily and Mounjaro 15 mg well. There are concerns about potential autoimmune diabetes due to atypical presentation and the need for insulin therapy. Insulin therapy is necessary due to high glucose levels and potential autoimmune diabetes. Continuous glucose monitoring and insulin adjustments based on glucose readings are essential. The risks of hypoglycemia and the importance of monitoring and adjusting insulin doses were discussed. Collaboration with a interior and spatial designer and nutritionist is crucial for comprehensive diabetes management. Initiated insulin therapy with glargine 10 units once daily and Novolog 5 units with each meal, with adjustments based on glucose readings. Ordered a continuous glucose monitor for real-time glucose monitoring. Increased metformin dosageto 1000mg  bid. Referred to an  endocrinologist for further evaluation and management. Scheduled follow-up with a registered pharmacist and nutritionist for diabetes management. Ordered blood tests to evaluate for autoimmune diabetes markers. Send readings on Monday for further adjustments  Vitamin B12 deficiency   Requiring supplementation. Administered B12 injection.     Return for as sch monthly.  Jenkins CHRISTELLA Carrel, MD

## 2024-06-18 NOTE — Patient Instructions (Addendum)
 Glargine 10units/day  Novolog is the short acting meal time one-start with 5 units each meal-if the sugar doesn't budge, then do 10 units.  Send results on Monday

## 2024-06-18 NOTE — Progress Notes (Signed)
 Patient is in office today for a nurse visit for B12 Injection. Patient Injection was given in the  Right deltoid. Patient tolerated injection well.

## 2024-06-20 ENCOUNTER — Encounter: Payer: Self-pay | Admitting: Family Medicine

## 2024-06-21 ENCOUNTER — Ambulatory Visit: Payer: Self-pay | Admitting: Family Medicine

## 2024-06-21 ENCOUNTER — Other Ambulatory Visit (HOSPITAL_COMMUNITY): Payer: Self-pay
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
## 2024-06-21 NOTE — Telephone Encounter (Signed)
 Sent to PA Team.

## 2024-06-21 NOTE — Telephone Encounter (Signed)
 Paperwork on your desk.

## 2024-06-21 NOTE — Progress Notes (Signed)
 Some labs still pending. Same dose potassium.  Sugar was ok the day checked but I know usually high.   Liver test slightly elevated

## 2024-06-22 ENCOUNTER — Other Ambulatory Visit: Payer: Self-pay | Admitting: Pharmacist

## 2024-06-22 NOTE — Progress Notes (Signed)
 Pt has read note smk

## 2024-06-22 NOTE — Telephone Encounter (Signed)
Noted smk

## 2024-06-22 NOTE — Patient Instructions (Signed)
 Sliding Scale for Novolog Take 10 units with / before each meal. Add additional units based on blood glucose prior to meal 200 to 250  - add 1 unit 251 to 300 - add 2 units 301 to 350 - add 3 units 351 to 400 - add 4 units > 400 - add 5 units and call office.       Increase non-starchy vegetables - carrots, green bean, squash, zucchini, tomatoes, onions, peppers, spinach and other green leafy vegetables, cabbage, lettuce, cucumbers, asparagus, okra (not fried), eggplant Limit sugar and processed foods (cakes, cookies, ice cream, crackers and chips) Increase fresh fruit but limit serving sizes 1/2 cup or about the size of tennis or baseball Limit red meat to no more than 1-2 times per week (serving size about the size of your palm) Choose whole grains / lean proteins - whole wheat bread, quinoa, whole grain rice (1/2 cup), fish, chicken, turkey Avoid sugar and calorie containing beverages - soda, sweet tea and juice.  Choose water  or unsweetened tea instead.  What is the Diabetes Plate? by The American Diabetes Association  What is the Diabetes Plate?  The Diabetes Plate is the easiest way to create healthy meals that can help manage blood sugar. Using this method, you can create perfectly portioned meals with a healthy balance of vegetables, protein, and carbohydrates--without any counting, calculating, weighing, or measuring. All you need is a plate!  The Diabetes Plate is the easiest way to create healthy low-carb meals that can help you manage your blood glucose (blood sugar). Using the Diabetes Plate, you can create a meal with a healthy balance of vegetables, protein, and carbs--without any counting, calculating, weighing, or measuring. All you need is a plate!   To start out, you need a plate that is nine inches across. The size of our plate is what controls the size of our portions. If your dinner plates are larger than nine inches, try using a smaller salad or dessert plate for  your meals. Or, if your dinner plates have a lip or artwork along the edge, use that as a border for filling your plate if the area inside the border is 9 inches across.     Now that you have the right plate, it's time to fill it!   1. Fill 1/2 your plate with nonstarchy vegetables.  Non-starchy vegetables are lower in carbs, so they don't raise your blood glucose very much. They are also higher in fiber, vitamins, and minerals--making them an important part of a healthy diet and what some people call "superstar foods". Filling half your plate with non-starchy vegetables means you will get plenty of servings.   Examples of non-starchy vegetables:   Asparagus  Broccoli  Cauliflower  Brussels Sprouts  Cabbage?(green, red, napa, Chinese)  Bok choy  Carrots  Cauliflower  Celery  Cucumber  Eggplant  Jicama  Leafy greens such as?kale,?collards, mustard greens, and Swiss Chard  Mushrooms  Nopales (cactus)  Okra  Onions  Leeks  Green beans, pea pods,?snow peas, and sugar?snap peas  Peppers?such as bell peppers and hot peppers (jalapeo, poblano, and others)  Salad greens such as?lettuce,?spinach,?arugula,?endive, and other salad mixes  Squash such as?zucchini,?yellow squash,?chayote, or?spaghetti squash  Radish or daikon  Tomatoes  Tomatillos    2. Fill 1/4 of your plate with lean protein foods  Foods high in protein such as fish, chicken, lean beef, soy foods, and cheese are all considered protein foods.   Proteins foods (especially those from animal sources)  usually contain saturated fat, which can increase your risk of heart disease. Lean proteins are lower in both fat and saturated fat, making them a healthier choice.?   Keep in mind that some plant-based protein foods (like beans and legumes) are also high in carbohydrates.   Examples of lean protein foods include:  Chicken,?turkey, and?eggs  Fish like?salmon,?cod,?tuna,?tilapia, and?swordfish  Shellfish  like?shrimp,?scallops,?clams,?mussels, and?lobster  Lean beef?cuts such as chuck, round, sirloin, flank, and tenderloin  Lean pork?cuts such as center loin chop and tenderloin  Lean deli meats  Cheese?and cottage cheese  Plant-based sources of protein:   Beans,?lentils,?hummus, and?falafel  Nuts and nut butters  Edamame  Tofu?and tempeh  Plant-based meat substitutes    3. Fill 1/4 of your plate with carbohydrate foods Foods that are higher in carbs include whole grains, starchy vegetables, beans and legumes, fruit, yogurt, and milk. These foods have the largest effect on blood glucose.   Limiting your portion of carbohydrate foods to one-quarter of your plate can help keep blood glucose from rising too high after meals.   Examples of carbohydrate foods:   Whole grains such as?[place] rice,?bulgur,?oats/oatmeal, polenta,?popcorn,?quinoa, and whole grain products (bread,?pasta, and?tortillas)  Starchy vegetables such as corn,?acorn squash,?butternut squash,?green peas,?parsnips,?plantains,?potatoes,?pumpkins, and?sweet potatoes/yams  Beans and legumes such as?black,?kidney,?pinto, and?garbanzo beans and lentils  Fruits and dried fruit  Dairy products like milk,?yogurt, and milk substitutes (e.g., soy milk)    4. Choose water  or a low-calorie drink Water  is the best choice because it contains no calories or carbs and has no effect on blood glucose. Other zero- or low-calorie drink options include:   Unsweetened tea (hot or iced)  Unsweetened coffee (hot or iced)  Sparkling water /club soda  Infused water  or sparkling water  without added sugar  Diet soda or other diet drinks  Looking for more ideas? Check out this article for more?tips and recipes for meal planning with the Diabetes Plate.   What about combination foods? Example of the diabetes plate, an easy method for diabetics to eat healthy Our meals don't always fit into the sections of the Diabetes Plate. Many dishes--like  soups, casseroles, sandwiches, pizza, and pasta--combine the different food types together. You can still use the Diabetes Plate when you prep e and serve  these types of foods. Just identify the different foods in the dish and think about where they would fit in the Plate.   Try to prepare combination dishes with proportions that follow the Diabetes Plate. So, to build a pizza using the Diabetes Plate, choose a thin or vegetable-based crust to reduce the portion of carbs and top it with lots of vegetables instead of meat (or choose a lean meat). Stick to just one or two slices and serve with a side salad so half your meal is non-starchy vegetables.    The Diabetes Plate is a great place to start to create balanced low-carb meals. You can work with your dietitian or diabetes care and education specialist to find the meal pattern that works best for you and your fits specific health goals.

## 2024-06-22 NOTE — Progress Notes (Signed)
 05/13/2024 Name: ANALEIGH ARIES MRN: [id number] DOB: 11-12-1980  Chief Complaint  Patient presents with   Diabetes   Medication Management    LERLENE TREADWELL is a 43 y.o. year old female who presented for a telephone visit.   They were referred to the pharmacist by their PCP for assistance in managing diabetes and complex medication management.    Subjective:  Care Team: Primary Care Provider: Wendolyn Jenkins Jansky, MD ; Next Scheduled Visit: 08/02/2024 Psychiatrist: Dr Izella Caraway; Next Scheduled Visit:  unable to see scheduled appointment Neurologist: Dr Gregg: Next Scheduled visit: 07/07/2024  Medication Access/Adherence  Current Pharmacy:  [id number] [address], [place] - [address] [place] [address] Phone: [phone] Fax: [phone]   Patient reports affordability concerns with their medications: No  Patient reports access/transportation concerns to their pharmacy: No  Patient reports adherence concerns with their medications:  Yes  - Waiting on prior authorization Freestyle Libre 3 Plus Sensors.  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 Most recent LFTs show improved liver disease, though Dr Linton noted possible symptoms of encephalopathy / brain fog this year. No ascites noted in past.  Child-Pugh Score of A.   She has been taking Zepbound  but most recent prior authorization was denied. She was changed to Mounjaro 15mg  weekly since she has type 2 DM diagnosis. She started Lantus 10 units daily last week. Increased to 12 units yesterday. Also started Novolog 10 units with each meal.  Metformin dose was increased to 500mg  - take 2 tablets twice a day 06/18/2024 - updated has not been filled yet per patient. She will run out of metformin in 5 to 6 days.   Recent blood glucose readings:  06/19/2024 - 2pm - 179    (took Lantus  10 units in the morning)          Before dinner 153 - took 5 units of Novolog         After Dinner - 177  06/20/2024 - 7:30 am - 204 (too Lantus 10 units in the morning)          7:30pm - 176 - took 10 units of Novolog         11pm - 171  06/21/2024 - FBG in morning - 141 (took Lantus 12 units)            Lunch - 167 (took 10 units of novolog)           30 minutes after lunch - 132                      7pm - 171 (no meal and no insulin)                      11pm - 141  06/22/2024 - 3am - 107                     FBG in morning - 141   . Objective:  Lab Results  Component Value Date   HGBA1C 7.3 (H) 03/31/2024    Lab Results  Component Value Date   CREATININE 0.72 06/18/2024   BUN 20 06/18/2024   NA 138 06/18/2024   K 4.1 06/18/2024   CL 101 06/18/2024   CO2 24 06/18/2024    Lab Results  Component Value Date   CHOL 172 09/17/2023   HDL 32.40 (L) 09/17/2023   LDLCALC 104 (H) 09/17/2023   TRIG 178.0 (H) 09/17/2023   CHOLHDL 5 09/17/2023    Medications Reviewed Today     Reviewed by Carla Milling, RPH-CPP (Pharmacist) on 06/22/24 at 1048  Med List Status: <None>   Medication Order Taking? Sig Documenting Provider Last Dose Status Informant  Accu-Chek FastClix Lancets MISC [id number] Yes 1 each by Does not apply route daily at 12 noon. Wendolyn Jenkins Jansky, MD  Active   Continuous Glucose Sensor (FREESTYLE LIBRE 3 PLUS SENSOR) [place] [id number]  Change sensor every 15 days.  Patient not taking: Reported on 06/22/2024   Wendolyn Jenkins Jansky, MD  Active     Discontinued 06/22/24 1043 (Dose change)   gabapentin (NEURONTIN) 600 MG tablet [id number] Yes Take 600 mg by mouth 4 (four) times daily. [provider]  Active   Galcanezumab -gnlm (EMGALITY ) 120 MG/ML SOAJ [id number] Yes Inject 120 mg into the skin every 30 (thirty) days. Wendolyn Jenkins Jansky, MD  Active   glucose blood (ACCU-CHEK GUIDE TEST) test strip [id number] Yes Check daily Wendolyn Jenkins Jansky, MD  Active    hydrochlorothiazide  (HYDRODIURIL ) 25 MG tablet [id number] Yes TAKE 1 TABLET BY MOUTH DAILY AS NEEDED. Wendolyn Jenkins Jansky, MD  Active   HYDROcodone -acetaminophen  [facility]) 10-325 MG tablet [id number] Yes Take 1 tablet by mouth every 6 (six) hours as needed for severe pain (pain score 7-10). [provider]  Active   hydrOXYzine  (VISTARIL ) 25 MG capsule [id number] Yes Take 2 capsules (50 mg total) by mouth 3 (three) times daily as needed. Izella Ismael NOVAK, MD  Active   insulin aspart (NOVOLOG FLEXPEN) 100 UNIT/ML FlexPen [id number] Yes Inject 10 Units into the skin 3 (three) times daily with meals. Wendolyn Jenkins Jansky, MD  Active   insulin glargine (LANTUS SOLOSTAR) 100 UNIT/ML Solostar Pen [id number] Yes Inject 30 Units into the skin daily.  Patient taking differently: Inject 12 Units into the skin daily.   Wendolyn Jenkins Jansky, MD  Active   Insulin Pen Needle (PEN NEEDLES) 31G X 5 MM MISC [id number]  1 each by Does not apply route 4 (four) times daily as needed. Dispense based on patient and insurance preference. Use up to four times daily as directed. (FOR ICD-10 E10.9, E11.9).  Patient not taking: Reported on 06/22/2024   Wendolyn Jenkins Jansky, MD  Active   KLOR-CON  M20 20 MEQ tablet [id number] Yes Take 2 tablets (40 mEq total) by mouth 3 (three) times daily. Wendolyn Jenkins Jansky, MD  Active   lactulose  (CHRONULAC ) 10 GM/15ML solution [id number] Yes TAKE THREE TIMES DAILY AS NEEDED Stacia Glendia BRAVO, MD  Active   lamoTRIgine  (LAMICTAL ) 25 MG tablet [id number] Yes Take 2 tablets (50 mg total) by mouth 2 (two) times daily. Izella Ismael NOVAK, MD  Active   levETIRAcetam  (KEPPRA ) 750 MG tablet [id number] Yes Take 1 tablet (750 mg total) by mouth 2 (two) times daily. Gregg Lek, MD  Active   lidocaine  (LIDODERM ) 5 % [id number] Yes PLACE 1 PATCH ONTO THE SKIN DAILY. REMOVE & DISCARD PATCH WITHIN 12 HOURS OR AS DIRECTED BY MD Georgina Ozell LABOR, MD  Active   metFORMIN (GLUCOPHAGE) 500 MG tablet [id number] Yes Take 2  tablets (1,000 mg total) by mouth 2 (two) times daily with a meal. Wendolyn Jenkins Jansky, MD  Active   norethindrone  (AYGESTIN ) 5 MG tablet [id number] Yes Take 1 tablet (5 mg total) by mouth in the morning and  at bedtime. Wendolyn Jenkins Jansky, MD  Active   omeprazole  [facility]) 40 MG capsule [id number] Yes Take 1 capsule (40 mg total) by mouth daily. Wendolyn Jenkins Jansky, MD  Active   ondansetron  (ZOFRAN -ODT) 4 MG disintegrating tablet [id number]  TAKE 1 TABLET BY MOUTH EVERY 8 HOURS AS NEEDED FOR NAUSEA AND VOMITING Webb, Padonda B, FNP  Active   promethazine  (PHENERGAN ) 25 MG suppository [id number]  Place 1 suppository (25 mg total) rectally every 6 (six) hours as needed for nausea or vomiting. Wendolyn Jenkins Jansky, MD  Active   promethazine  (PHENERGAN ) 25 MG tablet [id number]  Take 1 tablet (25 mg total) by mouth daily. Wendolyn Jenkins Jansky, MD  Active   sertraline  (ZOLOFT ) 100 MG tablet [id number] Yes Take 1 tablet (100 mg total) by mouth daily. Izella Ismael NOVAK, MD  Active   spironolactone  (ALDACTONE ) 25 MG tablet [id number] Yes Take 1 tablet (25 mg total) by mouth daily. Wendolyn Jenkins Jansky, MD  Active   tirzepatide  [facility]) 15 MG/0.5ML Pen [id number] Yes Inject 15 mg into the skin once a week. Wendolyn Jenkins Jansky, MD  Active   tiZANidine [facility]) 4 MG capsule [id number] Yes  [provider]  Active   traZODone (DESYREL) 50 MG tablet [id number] Yes Take 1 tablet (50 mg total) by mouth at bedtime as needed for sleep. Izella Ismael NOVAK, MD  Active   Vitamin D , Ergocalciferol , (DRISDOL ) 1.25 MG (50000 UNIT) CAPS capsule [id number] Yes TAKE 1 CAPSULE (50,000 UNITS TOTAL) BY MOUTH EVERY 7 (SEVEN) DAYS Wendolyn Jenkins Jansky, MD  Active               Assessment/Plan:   Diabetes: - Currently uncontrolled; goal A1c <7%.  - Continue Mounjaro 15mg  weekly and metformin 500mg  - take 2 tablets twice a day. [facility] that was sent in for metformin last week.  - Continue Lantus 12 units daily  and Novolog 10 units with each meal. Add addition Novolog as needed for elevated blood glucose - 200 to 250 - add 1 unit; 251 to 300 - add 2 units; 310 to 350 - add 3 units; 351 to 400 - add 4 units; > 400 - add 5 units.  - Tried to submit prior authorization for [place] 3 plus sensors (Key: [id number]). Received notification that [facility] that prior authorization is not needed. [facility] and [facility] were [facility] Libre 3 plus sensors - patient notified. Patient feels that she can start sensor on her own. Will plan to touch base with her on Friday 11/14 to connect thru [facility] and provide further instruction. She will call sooner if any issues.  - Discussed CHO counting and meal planning. Sending information on ADA plate method for meal planning. Also discussed that Lactulose  that she take for liver disease / for regular BMs contains about 12 to 14 grams of carbohydrates. Will monitor blood glucose around Lactulose  dosing. Will be able to see on Continuous Glucose Monitor if Lactulose  is significantly contributing to blood glucose rises.  - Reviewed goal blood glucose:  Fasting blood glucose goal (before meals) = 80 to 130 Blood glucose goal after a meal = less than 180     Madelin Ray, PharmD Clinical Pharmacist [facility] Primary Care  Population Health [phone]

## 2024-06-23 ENCOUNTER — Other Ambulatory Visit (HOSPITAL_COMMUNITY): Payer: Self-pay | Admitting: Psychiatry

## 2024-06-23 ENCOUNTER — Other Ambulatory Visit: Payer: Self-pay | Admitting: Family Medicine

## 2024-06-23 ENCOUNTER — Telehealth: Payer: Self-pay

## 2024-06-23 DIAGNOSIS — F411 Generalized anxiety disorder: Secondary | ICD-10-CM

## 2024-06-23 DIAGNOSIS — F331 Major depressive disorder, recurrent, moderate: Secondary | ICD-10-CM

## 2024-06-23 DIAGNOSIS — G4700 Insomnia, unspecified: Secondary | ICD-10-CM

## 2024-06-23 LAB — ANTI-ISLET CELL ANTIBODY: Islet Cell Ab: NEGATIVE

## 2024-06-23 MED ORDER — SERTRALINE HCL 100 MG PO TABS: 100.0000 mg | ORAL_TABLET | Freq: Every day | ORAL | 0 refills | Status: DC

## 2024-06-23 MED ORDER — UBRELVY 100 MG PO TABS: 100.0000 mg | ORAL_TABLET | Freq: Every day | ORAL | 1 refills | Status: DC | PRN

## 2024-06-23 NOTE — Telephone Encounter (Signed)
 Pharmacy Patient Advocate Encounter   Received notification from Physician's Office that prior authorization for FreeStyle Libre 3 Plus Sensor is required/requested.   Insurance verification completed.   The patient is insured through [facility] MEDICAID.   Per test claim: PA required; PA submitted to above mentioned insurance via Latent Key/confirmation #/EOC [id number] Status is pending

## 2024-06-24 ENCOUNTER — Other Ambulatory Visit (HOSPITAL_COMMUNITY): Payer: Self-pay

## 2024-06-24 ENCOUNTER — Telehealth: Payer: Self-pay

## 2024-06-24 LAB — INSULIN ANTIBODIES, BLOOD: Insulin Antibodies, Human: <0.4 U/mL (ref <0.4)

## 2024-06-24 LAB — GLUTAMIC ACID DECARBOXYLASE AUTO ABS: Glutamic Acid Decarb Ab: <5 [IU]/mL (ref <5)

## 2024-06-24 LAB — C-PEPTIDE: C-Peptide: 5.43 ng/mL — ABNORMAL HIGH (ref 0.80–3.85)

## 2024-06-24 NOTE — Progress Notes (Signed)
 Patient has seen labs via MyChart. No call made.   Last read by Harlene GORMAN Daring at 9:12AM on 06/24/2024.

## 2024-06-24 NOTE — Progress Notes (Signed)
 Patient sent message about prior authorization for Ubrelvy.  [facility] and [facility] still needed. I don't see that prior authorization has been started yet in chart.  Sent prior authorization thru Cover My Meds  (Key: [id number])  Madelin Ray, PharmD Clinical Pharmacist [facility] Primary Care  Population Health [phone]

## 2024-06-24 NOTE — Progress Notes (Signed)
 Doesn't look like her diabetes is autoimmune

## 2024-06-24 NOTE — Telephone Encounter (Signed)
 Pharmacy Patient Advocate Encounter  Received notification from [employer] that Prior Authorization for  FreeStyle Libre 3 Plus Sensor has been APPROVED from 06/22/2024 to 12/19/2024. Unable to obtain price due to refill too soon rejection, last fill date 06/22/2024 next available fill date 08/28/2024   PA #/Case ID/Reference #: [id number]

## 2024-06-24 NOTE — Telephone Encounter (Signed)
 Pharmacy Patient Advocate Encounter   Received notification from Onbase that prior authorization for NovoLOG FlexPen 100UNIT/ML pen-injectors is required/requested.   Insurance verification completed.   The patient is insured through [facility] MEDICAID.   Per test claim: Refill too soon. PA is not needed at this time. Medication was filled 06/18/24. Next eligible fill date is 07/09/24.

## 2024-06-25 ENCOUNTER — Encounter: Payer: Self-pay | Admitting: Pharmacist

## 2024-06-25 ENCOUNTER — Telehealth: Payer: Self-pay | Admitting: Pharmacist

## 2024-06-25 ENCOUNTER — Other Ambulatory Visit: Admitting: Pharmacist

## 2024-06-25 NOTE — Progress Notes (Signed)
 Attempt was made to contact patient by phone today for follow up by Clinical Pharmacist regarding blood glucose / diabetes.  Unable to reach patient. LM on VM with my contact number [phone].  I also received request from her health plan that additional information was needed for [place] prior authorization.  Submitted additional information thru Cover My Meds. Prior authorization pending. (Key: [id number])

## 2024-06-27 NOTE — Progress Notes (Signed)
Opened in error  This encounter was created in error - please disregard. 

## 2024-06-28 NOTE — Telephone Encounter (Signed)
 Being worked on

## 2024-06-30 ENCOUNTER — Telehealth: Payer: Self-pay

## 2024-06-30 NOTE — Telephone Encounter (Signed)
 Pharmacy Patient Advocate Encounter  Received notification from [facility] MEDICAID that Prior Authorization for Nurtec 75MG  TABS has been DENIED.  Full denial letter will be uploaded to the media tab. See denial reason below.   PA #/Case ID/Reference #: [id number]   Patient has to try and fail at least 2 preferred injectable..   -Aimovig Auto injector -Ajovy Auto- injector

## 2024-07-01 NOTE — Telephone Encounter (Signed)
 Ok waiting on letter smk

## 2024-07-05 ENCOUNTER — Encounter: Admitting: Dietician

## 2024-07-05 ENCOUNTER — Ambulatory Visit (HOSPITAL_COMMUNITY): Admitting: Psychiatry

## 2024-07-05 DIAGNOSIS — F331 Major depressive disorder, recurrent, moderate: Secondary | ICD-10-CM

## 2024-07-05 DIAGNOSIS — F411 Generalized anxiety disorder: Secondary | ICD-10-CM

## 2024-07-06 DIAGNOSIS — F331 Major depressive disorder, recurrent, moderate: Secondary | ICD-10-CM | POA: Diagnosis not present

## 2024-07-06 DIAGNOSIS — F419 Anxiety disorder, unspecified: Secondary | ICD-10-CM | POA: Diagnosis not present
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
## 2024-07-07 ENCOUNTER — Ambulatory Visit (HOSPITAL_BASED_OUTPATIENT_CLINIC_OR_DEPARTMENT_OTHER): Admitting: Psychiatry

## 2024-07-07 ENCOUNTER — Ambulatory Visit: Admitting: Neurology

## 2024-07-07 ENCOUNTER — Encounter: Payer: Self-pay | Admitting: Neurology

## 2024-07-07 ENCOUNTER — Other Ambulatory Visit (HOSPITAL_COMMUNITY): Payer: Self-pay | Admitting: Psychiatry

## 2024-07-07 VITALS — BP 113/73 | Wt 279.0 lb

## 2024-07-07 VITALS — BP 131/84 | HR 98 | Ht 67.0 in | Wt 278.0 lb

## 2024-07-07 DIAGNOSIS — F411 Generalized anxiety disorder: Secondary | ICD-10-CM

## 2024-07-07 DIAGNOSIS — G4700 Insomnia, unspecified: Secondary | ICD-10-CM | POA: Diagnosis not present

## 2024-07-07 DIAGNOSIS — F332 Major depressive disorder, recurrent severe without psychotic features: Secondary | ICD-10-CM | POA: Insufficient documentation

## 2024-07-07 DIAGNOSIS — R569 Unspecified convulsions: Secondary | ICD-10-CM

## 2024-07-07 DIAGNOSIS — G43009 Migraine without aura, not intractable, without status migrainosus: Secondary | ICD-10-CM

## 2024-07-07 DIAGNOSIS — F331 Major depressive disorder, recurrent, moderate: Secondary | ICD-10-CM

## 2024-07-07 MED ORDER — HYDROXYZINE PAMOATE 25 MG PO CAPS: 50.0000 mg | ORAL_CAPSULE | Freq: Three times a day (TID) | ORAL | 0 refills | Status: DC | PRN

## 2024-07-07 MED ORDER — SUMATRIPTAN SUCCINATE 100 MG PO TABS: 100.0000 mg | ORAL_TABLET | Freq: Once | ORAL | 6 refills | Status: DC | PRN

## 2024-07-07 MED ORDER — TOPIRAMATE 50 MG PO TABS: 50.0000 mg | ORAL_TABLET | Freq: Two times a day (BID) | ORAL | 3 refills | Status: DC

## 2024-07-07 MED ORDER — TRAZODONE HCL 50 MG PO TABS
50.0000 mg | ORAL_TABLET | Freq: Every evening | ORAL | 1 refills | Status: AC | PRN
Start: 2024-07-07 — End: ?

## 2024-07-07 MED ORDER — SERTRALINE HCL 100 MG PO TABS: 150.0000 mg | ORAL_TABLET | Freq: Every day | ORAL | 1 refills | Status: DC

## 2024-07-07 MED ORDER — LAMOTRIGINE 25 MG PO TABS: 50.0000 mg | ORAL_TABLET | Freq: Two times a day (BID) | ORAL | 2 refills | Status: DC

## 2024-07-07 NOTE — Progress Notes (Signed)
 [facility]  PATIENT: Janice Arnold DOB: 1981/01/01  REQUESTING CLINICIAN: Wendolyn Jenkins Jansky, MD HISTORY FROM: Patient  REASON FOR VISIT: Worsening headaches    HISTORICAL  CHIEF COMPLAINT:  Chief Complaint  Patient presents with   Consult    Pt in room 13. Mother in room. Here for internal referral for migraines.     HISTORY OF PRESENT ILLNESS:  Discussed the use of AI scribe software for clinical note transcription with the patient, who gave verbal consent to proceed.  Janice Arnold is a 43 year old female with a history of seizures, diabetes, obesity, fatty liver who presents with worsening headaches.  She has been experiencing worsening headaches for more than six months, possibly up to a year. The headaches typically start as migraines that become intense and can last for days. The pain is usually located behind her right eye and can radiate to the brain and back, forming 'four points'. Occasionally, the pain occurs on the left side. She describes the pain as a 'squeezing' sensation, which can become severe enough to cause vomiting. She experiences photophobia and phonophobia during these episodes. The frequency of headaches varies, with at least two headache days per week, and some weeks experiencing headaches every day.  She has been on Emgality  for about a year. She has also tried Nurtec, which she finds effective, but insurance limitations prevent its frequent use. She has not tried other preventive medications prior to Emgality .  She has a history of seizures, with the last mild seizure occurring in September and the previous one in January or April. She experiences seizures approximately two to three times a year. She is currently on gabapentin, Lamictal , and Keppra  for seizure management.  She is also taking Mounjaro  for diabetes management, as she is allergic to Trulicity . Her sleep is poor, characterized by vivid dreams and frequent awakenings,  resulting in only 45 minutes to two hours of continuous sleep. She reports high stress levels, particularly when she was working. She does not consume caffeine. She can often predict the onset of a headache by a 'numb pain that kind of pulses'.  During the review of symptoms, she confirms experiencing nausea, photophobia, and phonophobia associated with her headaches.    Headache History and Characteristics: Onset: a year ago, getting worse  Location: Behind right eye and then moves up Quality:  Squeezing  Intensity:10 /10.  Duration: Lasting for days  Migrainous Features: Photophobia, phonophobia, nausea, vomiting.  Aura: No  History of brain injury or tumor: No  Family history: Mother  Motion sickness: no Cardiac history: no  OTC: Tylenol , Ibuprofen  Sleep: vivid dreams Mood/ Stress: High  Prior prophylaxis: Propranolol: No  Verapamil:No TCA: No Topamax : No Depakote: No Effexor: No but on Zoloft   Cymbalta : No Neurontin: Yes  CGRPs: Emgality   Prior abortives: Triptan: No Anti-emetic: No Steroids: No CGRPs: No    OTHER MEDICAL CONDITIONS: History of alcohol abuse, fatty liver, obesity, diabetes and seizures   REVIEW OF SYSTEMS: Full 14 system review of systems performed and negative with exception of: As noted in HPI  ALLERGIES: Allergies  Allergen Reactions   Trulicity  [Dulaglutide ] Itching    HOME MEDICATIONS: Outpatient Medications Prior to Visit  Medication Sig Dispense Refill   Accu-Chek FastClix Lancets MISC 1 each by Does not apply route daily at 12 noon. 100 each 3   Continuous Glucose Sensor (FREESTYLE LIBRE 3 PLUS SENSOR) MISC Change sensor every 15 days. 6 each 3   gabapentin (NEURONTIN) 600 MG  tablet Take 600 mg by mouth 4 (four) times daily.     Galcanezumab -gnlm (EMGALITY ) 120 MG/ML SOAJ Inject 120 mg into the skin every 30 (thirty) days. 1.12 mL 12   glucose blood (ACCU-CHEK GUIDE TEST) test strip Check daily 100 each 12   hydrochlorothiazide   (HYDRODIURIL ) 25 MG tablet TAKE 1 TABLET BY MOUTH DAILY AS NEEDED. 90 tablet 1   HYDROcodone -acetaminophen  (NORCO) 10-325 MG tablet Take 1 tablet by mouth every 6 (six) hours as needed for severe pain (pain score 7-10).     hydrOXYzine  (VISTARIL ) 25 MG capsule Take 2 capsules (50 mg total) by mouth 3 (three) times daily as needed. 180 capsule 0   insulin  aspart (NOVOLOG  FLEXPEN) 100 UNIT/ML FlexPen Inject 10 Units into the skin 3 (three) times daily with meals. 15 mL 11   insulin  glargine (LANTUS  SOLOSTAR) 100 UNIT/ML Solostar Pen Inject 30 Units into the skin daily. (Patient taking differently: Inject 12 Units into the skin daily.) 15 mL PRN   Insulin  Pen Needle (PEN NEEDLES) 31G X 5 MM MISC 1 each by Does not apply route 4 (four) times daily as needed. Dispense based on patient and insurance preference. Use up to four times daily as directed. (FOR ICD-10 E10.9, E11.9). 400 each 3   KLOR-CON  M20 20 MEQ tablet Take 2 tablets (40 mEq total) by mouth 3 (three) times daily. 540 tablet 1   lactulose  (CHRONULAC ) 10 GM/15ML solution TAKE 15ML THREE TIMES DAILY AS NEEDED 4257 mL 1   lamoTRIgine  (LAMICTAL ) 25 MG tablet Take 2 tablets (50 mg total) by mouth 2 (two) times daily. 120 tablet 0   levETIRAcetam  (KEPPRA ) 750 MG tablet Take 1 tablet (750 mg total) by mouth 2 (two) times daily. 180 tablet 3   lidocaine  (LIDODERM ) 5 % PLACE 1 PATCH ONTO THE SKIN DAILY. REMOVE & DISCARD PATCH WITHIN 12 HOURS OR AS DIRECTED BY MD 30 patch 0   metFORMIN  (GLUCOPHAGE ) 500 MG tablet Take 2 tablets (1,000 mg total) by mouth 2 (two) times daily with a meal. 180 tablet 0   norethindrone  (AYGESTIN ) 5 MG tablet Take 1 tablet (5 mg total) by mouth in the morning and at bedtime. 60 tablet 2   omeprazole  (PRILOSEC) 40 MG capsule Take 1 capsule (40 mg total) by mouth daily. 90 capsule 1   ondansetron  (ZOFRAN -ODT) 4 MG disintegrating tablet TAKE 1 TABLET BY MOUTH EVERY 8 HOURS AS NEEDED FOR NAUSEA AND VOMITING 20 tablet 1    promethazine  (PHENERGAN ) 25 MG suppository Place 1 suppository (25 mg total) rectally every 6 (six) hours as needed for nausea or vomiting. 12 each 3   promethazine  (PHENERGAN ) 25 MG tablet Take 1 tablet (25 mg total) by mouth daily. 90 tablet 0   sertraline  (ZOLOFT ) 100 MG tablet Take 1 tablet (100 mg total) by mouth daily. 90 tablet 0   spironolactone  (ALDACTONE ) 25 MG tablet Take 1 tablet (25 mg total) by mouth daily. 90 tablet 3   tirzepatide  (MOUNJARO ) 15 MG/0.5ML Pen Inject 15 mg into the skin once a week. 6 mL 1   tiZANidine (ZANAFLEX) 4 MG capsule      traZODone  (DESYREL ) 50 MG tablet TAKE 1 TABLET BY MOUTH AT BEDTIME AS NEEDED FOR SLEEP. 90 tablet 1   Vitamin D , Ergocalciferol , (DRISDOL ) 1.25 MG (50000 UNIT) CAPS capsule TAKE 1 CAPSULE (50,000 UNITS TOTAL) BY MOUTH EVERY 7 (SEVEN) DAYS (Patient not taking: Reported on 07/07/2024) 12 capsule 0   Ubrogepant  (UBRELVY ) 100 MG TABS Take 1 tablet (100 mg  total) by mouth daily as needed. (Patient not taking: Reported on 07/07/2024) 16 tablet 1   No facility-administered medications prior to visit.    PAST MEDICAL HISTORY: Past Medical History:  Diagnosis Date   Alcohol abuse    history of heavy alcohol abuse, last drank alcohol in 06/2022   Anemia 2023   s/p iron  infusions   Anxiety    Follows with PCP. Dr. Jenkins Fredrickson.   Chronic kidney disease 8/23   Clotting disorder 03/12/22   Vaginal bleeding/very heavy periods   Compression fracture of body of thoracic vertebra (HCC) 11/05/2022   T4, T5, T6 / Follows w/ orthopedic, Dr. Ozell Ada. Fractures occurred while patient was having a seizure, per patient.   Cyclic vomiting syndrome    hospital admission on 05/28/22 for vomiting, follows with gastroenterologist Dr. Glendia Holt.   Diabetes [facility])    Edema    lower extremity, taking amiloride  & spironolactone , follows w/ PCP   Elevated LFTs 2023   Fatty liver due to alcoholism    Follows with gastroenterologist, Dr. Glendia Holt.   GERD (gastroesophageal reflux disease)    takes omeprazole  daily   Heart murmur 1982   since childhood   Hyperlipidemia    11/20/22 lipid panel in Epic   Hypokalemia 10/15/2022   K+ level 2.5   Menorrhagia    Palpitations    Follows w/ cardiology, Damien Boos, NP. 11/14/2022 echocardiogram EF 60 65%, 12/31/22 Event heart monitor results in Epic.   PONV (postoperative nausea and vomiting)    Prolonged QT interval    Follows w/ cardiology, Damien Braver, NP.   Seizure (HCC) 06/06/2022   Follows with neurologist, Dr. Hulan Falling @ [facility]. Seizures were thought to be related to stopping alcohol. However, patient has had seizures months after quitting alcohol. As of 02/06/23, last seizure was on 11/07/22. Keppra  was then increased to bid.   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 See 11/05/22 Head CT and MRI in Epic. Resolved per pt on 02/06/23.   Tremors of nervous system    whole body tremors, follows with Dr. Hulan Falling @ [facility].   Vitamin deficiency 2023   B12 deficiency, receiving monthly B12 injections / Vitamin D  deficiency, taking supplement every two weeks   Wears contact lenses    Wears glasses     PAST SURGICAL HISTORY: Past Surgical History:  Procedure Laterality Date   COLONOSCOPY WITH ESOPHAGOGASTRODUODENOSCOPY (EGD)  03/27/2022   one rectal polyp   LAPAROSCOPIC APPENDECTOMY N/A 03/02/2022   Procedure: APPENDECTOMY LAPAROSCOPIC;  Surgeon: Belinda Cough, MD;  Location: [facility];  Service: General;  Laterality: N/A;   ROBOTIC ASSISTED LAPAROSCOPIC HYSTERECTOMY AND SALPINGECTOMY Bilateral 03/04/2023   Procedure: ABORTED XI ROBOTIC ASSISTED LAPAROSCOPIC HYSTERECTOMY AND SALPINGECTOMY;  Surgeon: Gorge Ade, MD;  Location: [facility];  Service: Gynecology;  Laterality: Bilateral;  Requests 2/1/2 hrs.    FAMILY HISTORY: Family History  Problem Relation  Age of Onset   Breast cancer Mother    Cervical cancer Mother        ovarian   Bone cancer Mother    Breast cancer Maternal Grandmother    Colon cancer Maternal Great-grandmother 81   Esophageal cancer Neg Hx    Stomach cancer Neg Hx    Seizures Neg Hx    Stroke Neg Hx     SOCIAL HISTORY: Social History   Socioeconomic History   Marital status: Married  Spouse name: Not on file   Number of children: 3   Years of education: Not on file   Highest education level: Associate degree: occupational, scientist, product/process development, or vocational program  Occupational History   Occupation: Emergency Planning/management Officer  Tobacco Use   Smoking status: Never   Smokeless tobacco: Never  Vaping Use   Vaping status: Never Used  Substance and Sexual Activity   Alcohol use: Not Currently    Comment: History of heavy alcohol abuse. Quit in 2023. Last drank alcohol in 06/2022.   Drug use: Never   Sexual activity: Yes    Birth control/protection: Pill  Other Topics Concern   Not on file  Social History Narrative   Project mgr   Social Drivers of Health   Financial Resource Strain: High Risk (06/17/2024)   Overall Financial Resource Strain (CARDIA)    Difficulty of Paying Living Expenses: Very hard  Food Insecurity: Food Insecurity Present (06/17/2024)   Hunger Vital Sign    Worried About Running Out of Food in the Last Year: Sometimes true    Ran Out of Food in the Last Year: Sometimes true  Transportation Needs: Unknown (06/17/2024)   PRAPARE - Transportation    Lack of Transportation (Medical): No    Lack of Transportation (Non-Medical): Patient declined  Physical Activity: Unknown (06/17/2024)   Exercise Vital Sign    Days of Exercise per Week: Patient declined    Minutes of Exercise per Session: Not on file  Stress: Stress Concern Present (06/17/2024)   [employer] of Occupational Health - Occupational Stress Questionnaire    Feeling of Stress: Very much  Social Connections: Socially Isolated (06/17/2024)    Social Connection and Isolation Panel    Frequency of Communication with Friends and Family: Never    Frequency of Social Gatherings with Friends and Family: Never    Attends Religious Services: Never    Database Administrator or Organizations: No    Attends Engineer, Structural: Not on file    Marital Status: Married  Intimate Partner Violence: Unknown (05/03/2024)   Received from [facility]   Humiliation, Afraid, Rape, and Kick questionnaire    Within the last year, have you been afraid of your partner or ex-partner?: No    Within the last year, have you been humiliated or emotionally abused in other ways by your partner or ex-partner?: No    Within the last year, have you been kicked, hit, slapped, or otherwise physically hurt by your partner or ex-partner?: No    Sexually Abused: Not on file    PHYSICAL EXAM  GENERAL EXAM/CONSTITUTIONAL: Vitals:  Vitals:   07/07/24 0944  BP: 131/84  Pulse: 98  Weight: 278 lb (126.1 kg)  Height: 5' 7 (1.702 m)   Body mass index is 43.54 kg/m. Wt Readings from Last 3 Encounters:  07/07/24 278 lb (126.1 kg)  06/18/24 276 lb (125.2 kg)  05/12/24 284 lb (128.8 kg)   Patient is in no distress; well developed, nourished and groomed; neck is supple  MUSCULOSKELETAL: Gait, strength, tone, movements noted in Neurologic exam below  NEUROLOGIC: MENTAL STATUS:      No data to display         awake, alert, oriented to person, place and time recent and remote memory intact normal attention and concentration language fluent, comprehension intact, naming intact fund of knowledge appropriate  CRANIAL NERVE:  2nd - no papilledema or hemorrhages on fundoscopic exam 2nd, 3rd, 4th, 6th - pupils equal and reactive  to light, visual fields full to confrontation, extraocular muscles intact, no nystagmus 5th - facial sensation symmetric 7th - facial strength symmetric 8th - hearing intact 9th - palate elevates symmetrically, uvula  midline 11th - shoulder shrug symmetric 12th - tongue protrusion midline  MOTOR:  normal bulk and tone, full strength in the BUE, BLE  SENSORY:  normal and symmetric to light touch, pinprick  COORDINATION:  finger-nose-finger, fine finger movements normal  GAIT/STATION:  normal   DIAGNOSTIC DATA (LABS, IMAGING, TESTING) - I reviewed patient records, labs, notes, testing and imaging myself where available.  Lab Results  Component Value Date   WBC 7.9 05/12/2024   HGB 12.1 05/12/2024   HCT 37.4 05/12/2024   MCV 84.0 05/12/2024   PLT 339.0 05/12/2024      Component Value Date/Time   NA 138 06/18/2024 1234   NA 137 07/16/2022 0939   K 4.1 06/18/2024 1234   CL 101 06/18/2024 1234   CO2 24 06/18/2024 1234   GLUCOSE 120 (H) 06/18/2024 1234   BUN 20 06/18/2024 1234   BUN 12 07/16/2022 0939   CREATININE 0.72 06/18/2024 1234   CREATININE 0.69 09/30/2023 1111   CREATININE 0.53 08/18/2023 1644   CALCIUM 9.2 06/18/2024 1234   PROT 6.8 06/18/2024 1234   PROT 6.3 11/20/2022 1152   ALBUMIN 4.4 06/18/2024 1234   ALBUMIN 4.3 11/20/2022 1152   AST 24 06/18/2024 1234   AST 336 (HH) 09/30/2023 1111   ALT 47 (H) 06/18/2024 1234   ALT 659 (HH) 09/30/2023 1111   ALKPHOS 60 06/18/2024 1234   BILITOT 0.6 06/18/2024 1234   BILITOT 0.9 09/30/2023 1111   GFRNONAA >60 10/22/2023 1129   GFRNONAA >60 09/30/2023 1111   Lab Results  Component Value Date   CHOL 172 09/17/2023   HDL 32.40 (L) 09/17/2023   LDLCALC 104 (H) 09/17/2023   TRIG 178.0 (H) 09/17/2023   CHOLHDL 5 09/17/2023   Lab Results  Component Value Date   HGBA1C 7.3 (H) 03/31/2024   Lab Results  Component Value Date   VITAMINB12 377 09/30/2023   Lab Results  Component Value Date   TSH 1.87 06/18/2024    Head CT 10/17/2022 Unremarkable CT scan of the head without contrast.      ASSESSMENT AND PLAN  43 y.o. year old female with history of alcohol abuse, fatty liver, obesity, diabetes, seizure disorder who is  presenting with worsening migraine headaches.   Chronic migraine with episodic severe headaches Chronic migraines with episodic severe headaches for over six months, characterized by intense pain behind the right eye, photophobia, phonophobia, and nausea. Headaches occur at least twice a week, sometimes daily, lasting up to a week. Current treatment with Emgality  has not been effective in reducing headache frequency. Nurtec was effective but not approved by insurance due to chronic headache diagnosis. Insurance might requires trial of triptans before approving chronic migraine treatment. - Prescribed sumatriptan  for acute migraine attacks, limited to 10 tablets per month. - Prescribed topiramate  50 mg for migraine prevention, starting with 25 mg for the first two weeks, then increasing to 50 mg daily. - Provided samples of Nurtec for interim use until new medications take effect. - Instructed to report back if sumatriptan  is ineffective for insurance purposes.  Epilepsy with infrequent mild seizures Epilepsy with mild seizures occurring two to three times a year, last seizure in September. Current medications include gabapentin, lamotrigine , and levetiracetam . Topiramate  is considered for migraine prevention, which may also aid in seizure control.  1. Migraine without aura and without status migrainosus, not intractable   2. Seizures (HCC)     Patient Instructions  Continue current medications Start topiramate  50 mg, half tablet nightly for 2 weeks then increase to full tablet Use sumatriptan  as needed for as abortive medication Please call with updates Continue to follow PCP Return in 6 months or sooner if worse   No orders of the defined types were placed in this encounter.   Meds ordered this encounter  Medications   topiramate  (TOPAMAX ) 50 MG tablet    Sig: Take 1 tablet (50 mg total) by mouth 2 (two) times daily.    Dispense:  60 tablet    Refill:  3   SUMAtriptan   (IMITREX ) 100 MG tablet    Sig: Take 1 tablet (100 mg total) by mouth once as needed for up to 1 dose for migraine. May repeat in 2 hours if headache persists or recurs.    Dispense:  10 tablet    Refill:  6    Return in about 6 months (around 01/04/2025).  I personally spent a total of 45 minutes in the care of the patient today including preparing to see the patient, getting/reviewing separately obtained history, performing a medically appropriate exam/evaluation, counseling and educating, placing orders, and documenting clinical information in the EHR.   Pastor Falling, MD 07/07/2024, 10:19 AM  [facility] [address], [address] [place], [place] [address] [phone]

## 2024-07-07 NOTE — Patient Instructions (Signed)
 Continue current medications Start topiramate  50 mg, half tablet nightly for 2 weeks then increase to full tablet Use sumatriptan  as needed for as abortive medication Please call with updates Continue to follow PCP Return in 6 months or sooner if worse

## 2024-07-15 ENCOUNTER — Other Ambulatory Visit: Payer: Self-pay

## 2024-07-15 ENCOUNTER — Other Ambulatory Visit: Payer: Self-pay | Admitting: Family Medicine

## 2024-07-15 DIAGNOSIS — E119 Type 2 diabetes mellitus without complications: Secondary | ICD-10-CM

## 2024-07-15 MED ORDER — METFORMIN HCL 500 MG PO TABS: 1000.0000 mg | ORAL_TABLET | Freq: Two times a day (BID) | ORAL | 0 refills | Status: DC

## 2024-07-19 ENCOUNTER — Encounter: Payer: Self-pay | Admitting: Neurology

## 2024-07-19 ENCOUNTER — Other Ambulatory Visit (HOSPITAL_COMMUNITY): Payer: Self-pay

## 2024-07-19 ENCOUNTER — Telehealth: Payer: Self-pay

## 2024-07-19 NOTE — Telephone Encounter (Signed)
 error

## 2024-07-19 NOTE — Telephone Encounter (Signed)
 Please advise on changing treatment plan to Aimovig Auto injector, Ajovy Auto- injector is preferred by the insurance. Per PA team:Per test claim:  -Aimovig Auto injector, -Ajovy Auto- injector is preferred by the insurance.  If suggested medication is appropriate, Please send in a new RX and discontinue this one. If not, please advise as to why it's not appropriate so that we may request a Prior Authorization. Please note, some preferred medications may still require a PA.  If the suggested medications have not been trialed and there are no contraindications to their use, the PA will not be submitted, as it will not be approved. Patient notified via MyChart.

## 2024-07-19 NOTE — Telephone Encounter (Signed)
 Pharmacy Patient Advocate Encounter   Received notification from Onbase that prior authorization for Nurtec 75MG  dispersible tablets is required/requested.   Insurance verification completed.   The patient is insured through [facility] MEDICAID.   Per test claim:  -Aimovig Auto injector, -Ajovy Auto- injector is preferred by the insurance.  If suggested medication is appropriate, Please send in a new RX and discontinue this one. If not, please advise as to why it's not appropriate so that we may request a Prior Authorization. Please note, some preferred medications may still require a PA.  If the suggested medications have not been trialed and there are no contraindications to their use, the PA will not be submitted, as it will not be approved.

## 2024-07-20 NOTE — Telephone Encounter (Signed)
 Noted. Thank toy. Patient notified of note via MyChart. Currently waiting on her response.

## 2024-07-21 ENCOUNTER — Other Ambulatory Visit: Payer: Self-pay | Admitting: Neurology

## 2024-07-21 MED ORDER — NARATRIPTAN HCL 2.5 MG PO TABS: 2.5000 mg | ORAL_TABLET | ORAL | 6 refills | Status: DC | PRN

## 2024-07-21 NOTE — Progress Notes (Signed)
 Sumatriptan  not effective, will start Naratriptan.

## 2024-07-22 ENCOUNTER — Other Ambulatory Visit (HOSPITAL_COMMUNITY): Payer: Self-pay

## 2024-07-22 ENCOUNTER — Ambulatory Visit (HOSPITAL_COMMUNITY): Admitting: Psychiatry

## 2024-07-22 ENCOUNTER — Other Ambulatory Visit: Payer: Self-pay | Admitting: Neurology

## 2024-07-22 ENCOUNTER — Telehealth: Payer: Self-pay | Admitting: Pharmacist

## 2024-07-22 DIAGNOSIS — F331 Major depressive disorder, recurrent, moderate: Secondary | ICD-10-CM | POA: Diagnosis not present

## 2024-07-22 DIAGNOSIS — F419 Anxiety disorder, unspecified: Secondary | ICD-10-CM | POA: Diagnosis not present

## 2024-07-22 MED ORDER — RIZATRIPTAN BENZOATE 10 MG PO TBDP: 10.0000 mg | ORAL_TABLET | ORAL | 11 refills | Status: DC | PRN

## 2024-07-22 NOTE — Telephone Encounter (Signed)
 Error

## 2024-07-22 NOTE — Telephone Encounter (Signed)
 Rizatriptan ordered. Thanks

## 2024-07-22 NOTE — Telephone Encounter (Signed)
 Pharmacy Patient Advocate Encounter   Received notification from Patient Pharmacy that prior authorization for Naratriptan HCl 2.5MG  tablets is required/requested.   Insurance verification completed.   The patient is insured through [facility] MEDICAID.   Per test claim: PA required; PA submitted to above mentioned insurance via Latent Key/confirmation #/EOC [id number] Status is pending

## 2024-07-22 NOTE — Telephone Encounter (Signed)
 Pharmacy Patient Advocate Encounter  Received notification from [facility] MEDICAID that Prior Authorization for naratriptan (AMERGE) 2.5 MG tablet has been DENIED.  See denial reason below. No denial letter attached in CMM. Will attach denial letter to Media tab once received.   PA #/Case ID/Reference #: [id number]

## 2024-07-25 DIAGNOSIS — M62838 Other muscle spasm: Secondary | ICD-10-CM | POA: Diagnosis not present

## 2024-07-25 DIAGNOSIS — R5383 Other fatigue: Secondary | ICD-10-CM | POA: Diagnosis not present

## 2024-07-25 DIAGNOSIS — M792 Neuralgia and neuritis, unspecified: Secondary | ICD-10-CM | POA: Diagnosis not present

## 2024-07-25 DIAGNOSIS — E559 Vitamin D deficiency, unspecified: Secondary | ICD-10-CM | POA: Diagnosis not present

## 2024-07-25 DIAGNOSIS — Z79899 Other long term (current) drug therapy: Secondary | ICD-10-CM | POA: Diagnosis not present

## 2024-07-25 DIAGNOSIS — Z131 Encounter for screening for diabetes mellitus: Secondary | ICD-10-CM | POA: Diagnosis not present

## 2024-07-25 DIAGNOSIS — S22000A Wedge compression fracture of unspecified thoracic vertebra, initial encounter for closed fracture: Secondary | ICD-10-CM | POA: Diagnosis not present

## 2024-07-26 ENCOUNTER — Other Ambulatory Visit (HOSPITAL_COMMUNITY): Payer: Self-pay

## 2024-07-26 ENCOUNTER — Encounter: Payer: Self-pay | Admitting: Family

## 2024-07-26 ENCOUNTER — Telehealth: Payer: Self-pay

## 2024-07-26 NOTE — Telephone Encounter (Signed)
 Clinical questions have been answered and PA submitted. PA currently Pending. Please be advised that most companies allow up to 30 days to make a decision. We will advise when a determination has been made, or follow up in 1 week.   Please reach out to our team, Rx Prior Auth Pool, if you haven't heard back in a week.

## 2024-07-26 NOTE — Telephone Encounter (Signed)
 Pharmacy Patient Advocate Encounter   Received notification from Patient Advice Request messages that prior authorization for Rizatriptan  10mg  Dispersible tablet is required/requested.   Insurance verification completed.   The patient is insured through [facility] MEDICAID.   Per test claim: PA required; PA started via CoverMyMeds. KEY [id number] . Waiting for clinical questions to populate.

## 2024-07-26 NOTE — Telephone Encounter (Signed)
 Pharmacy Patient Advocate Encounter  Received notification from [facility] MEDICAID that Prior Authorization for Rizatriptan  has been APPROVED from 07/26/2024 to 07/31/2024. Ran test claim, Copay is $4.00 per 9 tablets/30DS. This test claim was processed through [facility]- copay amounts may vary at other pharmacies due to pharmacy/plan contracts, or as the patient moves through the different stages of their insurance plan.    PA #/Case ID/Reference #: [id number]    I am unsure why this approval was only thru the 20th  so 5 days-I sent PT a message so hopefully she picks her meds up and I will make a note to follow up on this after the 20th to see if it was a mistake on the plans part or if a new PA will truly be needed at that time.

## 2024-07-31 ENCOUNTER — Other Ambulatory Visit (HOSPITAL_COMMUNITY): Payer: Self-pay | Admitting: Psychiatry

## 2024-07-31 DIAGNOSIS — F331 Major depressive disorder, recurrent, moderate: Secondary | ICD-10-CM

## 2024-07-31 DIAGNOSIS — F411 Generalized anxiety disorder: Secondary | ICD-10-CM

## 2024-08-01 ENCOUNTER — Other Ambulatory Visit (HOSPITAL_COMMUNITY): Payer: Self-pay | Admitting: Psychiatry

## 2024-08-01 DIAGNOSIS — F411 Generalized anxiety disorder: Secondary | ICD-10-CM

## 2024-08-01 DIAGNOSIS — F331 Major depressive disorder, recurrent, moderate: Secondary | ICD-10-CM

## 2024-08-02 ENCOUNTER — Ambulatory Visit: Admitting: Family Medicine

## 2024-08-04 DIAGNOSIS — Z6841 Body Mass Index (BMI) 40.0 and over, adult: Secondary | ICD-10-CM | POA: Diagnosis not present

## 2024-08-04 DIAGNOSIS — M549 Dorsalgia, unspecified: Secondary | ICD-10-CM | POA: Diagnosis not present

## 2024-08-10 DIAGNOSIS — F419 Anxiety disorder, unspecified: Secondary | ICD-10-CM | POA: Diagnosis not present

## 2024-08-10 DIAGNOSIS — F331 Major depressive disorder, recurrent, moderate: Secondary | ICD-10-CM | POA: Diagnosis not present

## 2024-08-13 ENCOUNTER — Encounter: Payer: Self-pay | Admitting: Neurology

## 2024-08-16 ENCOUNTER — Ambulatory Visit (INDEPENDENT_AMBULATORY_CARE_PROVIDER_SITE_OTHER): Admitting: Family Medicine

## 2024-08-16 ENCOUNTER — Encounter: Payer: Self-pay | Admitting: Family Medicine

## 2024-08-16 ENCOUNTER — Other Ambulatory Visit: Payer: Self-pay | Admitting: Family Medicine

## 2024-08-16 VITALS — BP 128/84 | HR 96 | Temp 97.5°F | Ht 67.0 in | Wt 260.1 lb

## 2024-08-16 DIAGNOSIS — D509 Iron deficiency anemia, unspecified: Secondary | ICD-10-CM | POA: Diagnosis not present

## 2024-08-16 DIAGNOSIS — R600 Localized edema: Secondary | ICD-10-CM

## 2024-08-16 DIAGNOSIS — Z794 Long term (current) use of insulin: Secondary | ICD-10-CM

## 2024-08-16 DIAGNOSIS — E119 Type 2 diabetes mellitus without complications: Secondary | ICD-10-CM | POA: Diagnosis not present

## 2024-08-16 DIAGNOSIS — E538 Deficiency of other specified B group vitamins: Secondary | ICD-10-CM | POA: Diagnosis not present

## 2024-08-16 DIAGNOSIS — E876 Hypokalemia: Secondary | ICD-10-CM | POA: Diagnosis not present

## 2024-08-16 DIAGNOSIS — E559 Vitamin D deficiency, unspecified: Secondary | ICD-10-CM | POA: Diagnosis not present

## 2024-08-16 DIAGNOSIS — F411 Generalized anxiety disorder: Secondary | ICD-10-CM | POA: Diagnosis not present

## 2024-08-16 LAB — MICROALBUMIN / CREATININE URINE RATIO
Creatinine,U: 193.4 mg/dL
Microalb Creat Ratio: 11.4 mg/g (ref 0.0–30.0)
Microalb, Ur: 2.2 mg/dL — ABNORMAL HIGH (ref 0.7–1.9)

## 2024-08-16 LAB — CBC WITH DIFFERENTIAL/PLATELET
Basophils Absolute: 0.1 K/uL (ref 0.0–0.1)
Basophils Relative: 0.6 % (ref 0.0–3.0)
Eosinophils Absolute: 0.2 K/uL (ref 0.0–0.7)
Eosinophils Relative: 1.4 % (ref 0.0–5.0)
HCT: 38.8 % (ref 36.0–46.0)
Hemoglobin: 12.5 g/dL (ref 12.0–15.0)
Lymphocytes Relative: 20.6 % (ref 12.0–46.0)
Lymphs Abs: 2.3 K/uL (ref 0.7–4.0)
MCHC: 32.3 g/dL (ref 30.0–36.0)
MCV: 80 fl (ref 78.0–100.0)
Monocytes Absolute: 0.7 K/uL (ref 0.1–1.0)
Monocytes Relative: 5.9 % (ref 3.0–12.0)
Neutro Abs: 8 K/uL — ABNORMAL HIGH (ref 1.4–7.7)
Neutrophils Relative %: 71.5 % (ref 43.0–77.0)
Platelets: 434 K/uL — ABNORMAL HIGH (ref 150.0–400.0)
RBC: 4.85 Mil/uL (ref 3.87–5.11)
RDW: 16.8 % — ABNORMAL HIGH (ref 11.5–15.5)
WBC: 11.2 K/uL — ABNORMAL HIGH (ref 4.0–10.5)

## 2024-08-16 LAB — LIPID PANEL
Cholesterol: 224 mg/dL — ABNORMAL HIGH (ref 28–200)
HDL: 30.1 mg/dL — ABNORMAL LOW
LDL Cholesterol: 151 mg/dL — ABNORMAL HIGH (ref 10–99)
NonHDL: 193.87
Total CHOL/HDL Ratio: 7
Triglycerides: 214 mg/dL — ABNORMAL HIGH (ref 10.0–149.0)
VLDL: 42.8 mg/dL — ABNORMAL HIGH (ref 0.0–40.0)

## 2024-08-16 LAB — COMPREHENSIVE METABOLIC PANEL WITH GFR
ALT: 106 U/L — ABNORMAL HIGH (ref 3–35)
AST: 55 U/L — ABNORMAL HIGH (ref 5–37)
Albumin: 4.8 g/dL (ref 3.5–5.2)
Alkaline Phosphatase: 54 U/L (ref 39–117)
BUN: 23 mg/dL (ref 6–23)
CO2: 22 meq/L (ref 19–32)
Calcium: 9.9 mg/dL (ref 8.4–10.5)
Chloride: 104 meq/L (ref 96–112)
Creatinine, Ser: 1.18 mg/dL (ref 0.40–1.20)
GFR: 56.74 mL/min — ABNORMAL LOW
Glucose, Bld: 104 mg/dL — ABNORMAL HIGH (ref 70–99)
Potassium: 3.5 meq/L (ref 3.5–5.1)
Sodium: 139 meq/L (ref 135–145)
Total Bilirubin: 0.4 mg/dL (ref 0.2–1.2)
Total Protein: 7.9 g/dL (ref 6.0–8.3)

## 2024-08-16 LAB — MAGNESIUM: Magnesium: 2.1 mg/dL (ref 1.5–2.5)

## 2024-08-16 LAB — HEMOGLOBIN A1C: Hgb A1c MFr Bld: 6.2 % (ref 4.6–6.5)

## 2024-08-16 LAB — VITAMIN D 25 HYDROXY (VIT D DEFICIENCY, FRACTURES): VITD: 59.1 ng/mL (ref 30.00–100.00)

## 2024-08-16 MED ORDER — CYANOCOBALAMIN 1000 MCG/ML IJ SOLN
1000.0000 ug | Freq: Once | INTRAMUSCULAR | Status: AC
  Administered 2024-08-16: 1000 ug via INTRAMUSCULAR

## 2024-08-16 MED ORDER — OMEPRAZOLE 40 MG PO CPDR: 40.0000 mg | DELAYED_RELEASE_CAPSULE | Freq: Every day | ORAL | 1 refills | Status: AC

## 2024-08-16 MED ORDER — KLOR-CON M20 20 MEQ PO TBCR: 40.0000 meq | EXTENDED_RELEASE_TABLET | Freq: Three times a day (TID) | ORAL | 1 refills | Status: AC

## 2024-08-16 MED ORDER — NORETHINDRONE ACETATE 5 MG PO TABS: 5.0000 mg | ORAL_TABLET | Freq: Two times a day (BID) | ORAL | 0 refills | Status: AC

## 2024-08-16 MED ORDER — VITAMIN D (ERGOCALCIFEROL) 1.25 MG (50000 UNIT) PO CAPS: 50000.0000 [IU] | ORAL_CAPSULE | ORAL | 0 refills | Status: AC

## 2024-08-16 NOTE — Patient Instructions (Signed)
 It was very nice to see you today!  Keep up the great work   PLEASE NOTE:  If you had any lab tests please let us  know if you have not heard back within a few days. You may see your results on MyChart before we have a chance to review them but we will give you a call once they are reviewed by us . If we ordered any referrals today, please let us  know if you have not heard from their office within the next week.   Please try these tips to maintain a healthy lifestyle:  Eat most of your calories during the day when you are active. Eliminate processed foods including packaged sweets (pies, cakes, cookies), reduce intake of potatoes, Feldhaus bread, Chieffo pasta, and Sramek rice. Look for whole grain options, oat flour or almond flour.  Each meal should contain half fruits/vegetables, one quarter protein, and one quarter carbs (no bigger than a computer mouse).  Cut down on sweet beverages. This includes juice, soda, and sweet tea. Also watch fruit intake, though this is a healthier sweet option, it still contains natural sugar! Limit to 3 servings daily.  Drink at least 1 glass of water with each meal and aim for at least 8 glasses per day  Exercise at least 150 minutes every week.
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
## 2024-08-16 NOTE — Progress Notes (Signed)
 "  Subjective:     Patient ID: Janice Arnold, female    DOB: [birth year], 44 y.o.   MRN: [id number]  Chief Complaint  Patient presents with   Diabetes   Hypertension    Pt is here for 1 month follow up    Discussed the use of AI scribe software for clinical note transcription with the patient, who gave verbal consent to proceed.  History of Present Illness Janice Arnold is a 44 year old female with type 2 diabetes and anxiety who presents for follow-up on multiple health issues.  She has experienced significant weight loss since November 26th, losing 18 pounds despite the holiday season. She reports making dietary changes and using medications, including Mounjaro , which she notes affects her appetite. She reports eating more fruits, vegetables, and protein, and trying to reduce her intake of carbohydrates, especially bread. Her goal is to weigh under 200 pounds by June, before her son's graduation.  She manages her type 2 diabetes with Lantus , taking 12 to 15 units daily, and short-acting insulin , 15 units before meals. She notes fluctuations in her blood sugar readings, with her continuous glucose monitor sometimes showing low readings that are not confirmed by finger prick tests. She is also on metformin  1000 mg twice daily. She questions if she is insulin  resistant and is concerned about her diabetes management not being listed in her medical records.  Her anxiety is described as an 'uphill battle.' She is experiencing difficulties with her current in-person psychiatrist and has returned to an online provider who is adjusting her medications, including Lamictal , Buspar , and something w/a c. She has stopped taking hydroxyzine . She reports her depression and fatigue are at an all-time high, with frequent panic attacks.  She is experiencing issues with pain management due to a change in providers, leaving her without her usual pain medication. She continues to take gabapentin and muscle  relaxers but is without her primary pain medication.   Anemia-thought to be from heavy periods-She had a procedure in October to address vag bleeding issues, which involved embolization.  Her social situation is challenging, as she is caring for her husband, Caron, who is becoming increasingly confused and aggressive. She reports his behavior is deteriorating, with episodes of anger and confusion, and she is concerned about her safety and the potential for physical aggression.  Edema-still needing hydrochlorothiazide  and spironolactone .  Taking 2K bid B12 def-here for injection D def-needs refill on weekly meds    Health Maintenance Due  Topic Date Due   FOOT EXAM  Never done   Cervical Cancer Screening (HPV/Pap Cotest)  Never done   Mammogram  04/29/2020   OPHTHALMOLOGY EXAM  06/15/2023    Past Medical History:  Diagnosis Date   Alcohol abuse    history of heavy alcohol abuse, last drank alcohol in 06/2022   Anemia 2023   s/p iron  infusions   Anxiety    Follows with PCP. Dr. Jenkins Fredrickson.   Chronic kidney disease 8/23   Clotting disorder 03/12/22   Vaginal bleeding/very heavy periods   Compression fracture of body of thoracic vertebra (HCC) 11/05/2022   T4, T5, T6 / Follows w/ orthopedic, Dr. Ozell Ada. Fractures occurred while patient was having a seizure, per patient.   Cyclic vomiting syndrome    hospital admission on 05/28/22 for vomiting, follows with gastroenterologist Dr. Glendia Holt.   Diabetes [facility])    Edema    lower extremity, taking amiloride  & spironolactone , follows w/ PCP   Elevated  LFTs 2023   Fatty liver due to alcoholism    Follows with gastroenterologist, Dr. Glendia Holt.   GERD (gastroesophageal reflux disease)    takes omeprazole  daily   Heart murmur 1982   since childhood   Hyperlipidemia    11/20/22 lipid panel in Epic   Hypokalemia 10/15/2022   K+ level 2.5   Menorrhagia    Palpitations    Follows w/ cardiology, Damien Boos, NP.  11/14/2022 echocardiogram EF 60 65%, 12/31/22 Event heart monitor results in Epic.   PONV (postoperative nausea and vomiting)    Prolonged QT interval    Follows w/ cardiology, Damien Braver, NP.   Seizure (HCC) 06/06/2022   Follows with neurologist, Dr. Hulan Falling @ [facility]. Seizures were thought to be related to stopping alcohol. However, patient has had seizures months after quitting alcohol. As of 02/06/23, last seizure was on 11/07/22. Keppra  was then increased to bid.   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 See 11/05/22 Head CT and MRI in Epic. Resolved per pt on 02/06/23.   Tremors of nervous system    whole body tremors, follows with Dr. Hulan Falling @ [facility].   Type 2 diabetes mellitus (HCC)    Vitamin deficiency 2023   B12 deficiency, receiving monthly B12 injections / Vitamin D  deficiency, taking supplement every two weeks   Wears contact lenses    Wears glasses     Past Surgical History:  Procedure Laterality Date   COLONOSCOPY WITH ESOPHAGOGASTRODUODENOSCOPY (EGD)  03/27/2022   one rectal polyp   LAPAROSCOPIC APPENDECTOMY N/A 03/02/2022   Procedure: APPENDECTOMY LAPAROSCOPIC;  Surgeon: Belinda Cough, MD;  Location: [facility];  Service: General;  Laterality: N/A;   ROBOTIC ASSISTED LAPAROSCOPIC HYSTERECTOMY AND SALPINGECTOMY Bilateral 03/04/2023   Procedure: ABORTED XI ROBOTIC ASSISTED LAPAROSCOPIC HYSTERECTOMY AND SALPINGECTOMY;  Surgeon: Gorge Ade, MD;  Location: [facility] [address];  Service: Gynecology;  Laterality: Bilateral;  Requests 2/1/2 hrs.    Current Medications[1]  Allergies[2] ROS neg/noncontributory except as noted HPI/below      Objective:     BP 128/84 (BP Location: Left Arm, Patient Position: Sitting, Cuff Size: Large)   Pulse 96   Temp (!) 97.5 F (36.4 C) (Temporal)   Ht 5' 7 (1.702 m)   Wt 260 lb 2 oz (118 kg)   LMP 01/29/2023   SpO2 98%    BMI 40.74 kg/m  Wt Readings from Last 3 Encounters:  08/16/24 260 lb 2 oz (118 kg)  07/07/24 278 lb (126.1 kg)  06/18/24 276 lb (125.2 kg)    Physical Exam GENERAL: Well developed well nourished no acute distress HEAD EYES EARS NOSE THROAT: Normocephalic atraumatic, conjunctiva not injected, sclera nonicteric CARDIAC: Regular rate and rhythm, S1 S2 present , no murmur, dorsalis pedis 2 plus bilaterally NECK: Supple, no thyromegaly, no nodes, no carotid bruits LUNGS: Clear to auscultation bilaterally, no wheezes ABDOMEN: Bowel sounds present, soft, non tender non distended, no hepatosplenomegaly, no masses EXTREMITIES: No edema MUSCULOSKELETAL: No gross abnormalities NEUROLOGICAL: Alert and oriented x3, cranial nerves II through XII intact PSYCHIATRIC: Normal mood, good eye contact       Assessment & Plan:  Type 2 diabetes mellitus without complication, with long-term current use of insulin  (HCC) -     Comprehensive metabolic panel with GFR -     Hemoglobin A1c -     Lipid panel -     Microalbumin / creatinine urine ratio  Local edema  Hypokalemia -     Klor-Con  M20; Take 2 tablets (40 mEq total) by mouth 3 (three) times daily.  Dispense: 540 tablet; Refill: 1 -     Comprehensive metabolic panel with GFR -     Magnesium   B12 deficiency -     Cyanocobalamin   GAD (generalized anxiety disorder)  Iron  deficiency anemia, unspecified iron  deficiency anemia type -     Omeprazole ; Take 1 capsule (40 mg total) by mouth daily.  Dispense: 90 capsule; Refill: 1 -     CBC with Differential/Platelet  Vitamin D  deficiency -     Vitamin D  (Ergocalciferol ); Take 1 capsule (50,000 Units total) by mouth every 7 (seven) days.  Dispense: 12 capsule; Refill: 0 -     VITAMIN D  25 Hydroxy (Vit-D Deficiency, Fractures)  Other orders -     Norethindrone  Acetate; Take 1 tablet (5 mg total) by mouth in the morning and at bedtime.  Dispense: 60 tablet; Refill: 0    Assessment and  Plan Assessment & Plan Type 2 diabetes mellitus   Her diabetes is managed with Lantus , short-acting insulin , and metformin , and mounjaro . Blood glucose levels are generally well-controlled, though occasional discrepancies exist between CGM and finger prick readings. Insulin  resistance concerns were discussed. The current regimen is effective, but adjustments may be needed based on dietary intake. Continue Lantus  12-15 units daily. Adjust short-acting insulin  based on meal content, considering a reduction to 13 units if blood glucose levels are low. Continue metformin  1000 mg twice daily and mounjaro  15mg  weekly. An A1c test is ordered to assess current glycemic control.  Obesity   She has achieved significant weight loss since November 26th, aiming to reach 199 pounds by June. Current weight loss is attributed to dietary changes and Mounjaro . Challenges with appetite suppression and medication adherence are noted. Continue Mounjaro  as prescribed. Encourage dietary modifications focusing on protein, fruits, and vegetables while reducing carbohydrate intake. Monitor weight loss progress and adjust the plan as needed.  Generalized anxiety disorder   Anxiety is managed with Lamictal , Buspar , and an unspecified medication. The current therapist is ineffective, and she is considering switching. An online psychiatrist assists with medication management. Continue Lamictal  and Buspar  as prescribed. Consider switching to a new therapist if the current one remains ineffective. Confirm and update the medication list with the psychiatrist.  Major depressive disorder   Depression is at an all-time low, with increased fatigue and panic attacks. Current treatment includes Lamictal  and Buspar . She seeks additional support due to personal stressors. Continue the current medication regimen. Encourage follow-up with a psychiatrist for ongoing management.  Chronic pain syndrome   Pain is managed with gabapentin and muscle  relaxers. She is without prescribed pain medication due to a change in pain management providers. Gabapentin and muscle relaxers are still available. Continue gabapentin 600 mg four times daily. Follow up with pain management to resolve medication access issues.  Hypokalemia   Hypokalemia is managed with potassium supplementation. She has reduced the frequency of potassium intake. Continue potassium supplementation as prescribed.  Vitamin D  deficiency   Vitamin D  deficiency is managed with prescription ergocalciferol . She is unsure about over-the-counter vitamin D  intake. Continue prescription ergocalciferol  as prescribed. Consider over-the-counter vitamin D  supplementation if levels remain low.  B12 deficiency-received IM today  General health maintenance   She is due for a mammogram and Pap smear and is considering options for Pap smear completion. Schedule and complete a mammogram. Schedule and complete a Pap smear.  Return in about 4 weeks (around 09/13/2024) for chronic follow-up.  Jenkins CHRISTELLA Carrel, MD     [1]  Current Outpatient Medications:    Accu-Chek FastClix Lancets MISC, 1 each by Does not apply route daily at 12 noon., Disp: 100 each, Rfl: 3   Continuous Glucose Sensor (FREESTYLE LIBRE 3 PLUS SENSOR) MISC, Change sensor every 15 days., Disp: 6 each, Rfl: 3   gabapentin (NEURONTIN) 600 MG tablet, Take 600 mg by mouth 4 (four) times daily., Disp: , Rfl:    Galcanezumab -gnlm (EMGALITY ) 120 MG/ML SOAJ, Inject 120 mg into the skin every 30 (thirty) days., Disp: 1.12 mL, Rfl: 12   glucose blood (ACCU-CHEK GUIDE TEST) test strip, Check daily, Disp: 100 each, Rfl: 12   hydrochlorothiazide  (HYDRODIURIL ) 25 MG tablet, TAKE 1 TABLET BY MOUTH DAILY AS NEEDED., Disp: 90 tablet, Rfl: 1   HYDROcodone -acetaminophen  (NORCO) 10-325 MG tablet, Take 1 tablet by mouth every 6 (six) hours as needed for severe pain (pain score 7-10)., Disp: , Rfl:    insulin  aspart (NOVOLOG  FLEXPEN) 100 UNIT/ML  FlexPen, Inject 10 Units into the skin 3 (three) times daily with meals., Disp: 15 mL, Rfl: 11   insulin  glargine (LANTUS  SOLOSTAR) 100 UNIT/ML Solostar Pen, Inject 30 Units into the skin daily. (Patient taking differently: Inject 12 Units into the skin daily.), Disp: 15 mL, Rfl: PRN   Insulin  Pen Needle (PEN NEEDLES) 31G X 5 MM MISC, 1 each by Does not apply route 4 (four) times daily as needed. Dispense based on patient and insurance preference. Use up to four times daily as directed. (FOR ICD-10 E10.9, E11.9)., Disp: 400 each, Rfl: 3   lactulose  (CHRONULAC ) 10 GM/15ML solution, TAKE THREE TIMES DAILY AS NEEDED, Disp: 4257 mL, Rfl: 1   lamoTRIgine  (LAMICTAL ) 25 MG tablet, Take 2 tablets (50 mg total) by mouth 2 (two) times daily., Disp: 120 tablet, Rfl: 2   levETIRAcetam  (KEPPRA ) 750 MG tablet, Take 1 tablet (750 mg total) by mouth 2 (two) times daily., Disp: 180 tablet, Rfl: 3   lidocaine  (LIDODERM ) 5 %, PLACE 1 PATCH ONTO THE SKIN DAILY. REMOVE & DISCARD PATCH WITHIN 12 HOURS OR AS DIRECTED BY MD, Disp: 30 patch, Rfl: 0   metFORMIN  (GLUCOPHAGE ) 500 MG tablet, Take 2 tablets (1,000 mg total) by mouth 2 (two) times daily with a meal., Disp: 180 tablet, Rfl: 0   ondansetron  (ZOFRAN -ODT) 4 MG disintegrating tablet, TAKE 1 TABLET BY MOUTH EVERY 8 HOURS AS NEEDED FOR NAUSEA AND VOMITING, Disp: 20 tablet, Rfl: 1   promethazine  (PHENERGAN ) 25 MG suppository, Place 1 suppository (25 mg total) rectally every 6 (six) hours as needed for nausea or vomiting., Disp: 12 each, Rfl: 3   promethazine  (PHENERGAN ) 25 MG tablet, Take 1 tablet (25 mg total) by mouth daily., Disp: 90 tablet, Rfl: 0   rizatriptan  (MAXALT -MLT) 10 MG disintegrating tablet, TAKE 1 TABLET BY MOUTH AS NEEDED FOR MIGRAINE. MAY REPEAT IN 2 HOURS IF NEEDED, Disp: 9 tablet, Rfl: 11   sertraline  (ZOLOFT ) 100 MG tablet, TAKE 1.5 TABLETS (150MG  TOTAL) BY MOUTH DAILY, Disp: 135 tablet, Rfl: 1   spironolactone  (ALDACTONE ) 25 MG tablet, Take 1  tablet (25 mg total) by mouth daily., Disp: 90 tablet, Rfl: 3   tirzepatide  (MOUNJARO ) 15 MG/0.5ML Pen, Inject 15 mg into the skin once a week., Disp: 6 mL, Rfl: 1   tiZANidine (ZANAFLEX) 4 MG capsule, , Disp: , Rfl:    topiramate  (TOPAMAX ) 50 MG tablet, Take 1 tablet (50 mg total)  by mouth 2 (two) times daily., Disp: 60 tablet, Rfl: 3   traZODone  (DESYREL ) 50 MG tablet, Take 1 tablet (50 mg total) by mouth at bedtime as needed. for sleep, Disp: 60 tablet, Rfl: 1   KLOR-CON  M20 20 MEQ tablet, Take 2 tablets (40 mEq total) by mouth 3 (three) times daily., Disp: 540 tablet, Rfl: 1   norethindrone  (AYGESTIN ) 5 MG tablet, Take 1 tablet (5 mg total) by mouth in the morning and at bedtime., Disp: 60 tablet, Rfl: 0   omeprazole  (PRILOSEC) 40 MG capsule, Take 1 capsule (40 mg total) by mouth daily., Disp: 90 capsule, Rfl: 1   Vitamin D , Ergocalciferol , (DRISDOL ) 1.25 MG (50000 UNIT) CAPS capsule, Take 1 capsule (50,000 Units total) by mouth every 7 (seven) days., Disp: 12 capsule, Rfl: 0 [2]  Allergies Allergen Reactions   Trulicity  [Dulaglutide ] Itching   "

## 2024-08-17 ENCOUNTER — Ambulatory Visit: Payer: Self-pay | Admitting: Family Medicine

## 2024-08-17 ENCOUNTER — Encounter: Admitting: Dietician

## 2024-08-17 NOTE — Progress Notes (Signed)
 1  wbc slightly elevated.  Let me know if get sick 2.  Sugars much better! 3.  Liver tests slightly elevated again.  Will continue to monitor 4.  Vitamin D  where it should be-take it every other week 5.  Choesterol terrible-start zetia  10mg (send rx-daily #90).  Want to hold off on statin d/t liver

## 2024-08-17 NOTE — Telephone Encounter (Signed)
 Pt placed on wait list

## 2024-08-17 NOTE — Telephone Encounter (Signed)
 Please add patient to the cancellation list. Thanks

## 2024-08-18 ENCOUNTER — Other Ambulatory Visit: Payer: Self-pay

## 2024-08-18 ENCOUNTER — Encounter: Payer: Self-pay | Admitting: Family Medicine

## 2024-08-18 DIAGNOSIS — F411 Generalized anxiety disorder: Secondary | ICD-10-CM

## 2024-08-18 DIAGNOSIS — F331 Major depressive disorder, recurrent, moderate: Secondary | ICD-10-CM

## 2024-08-18 MED ORDER — EZETIMIBE 10 MG PO TABS: 10.0000 mg | ORAL_TABLET | Freq: Every day | ORAL | 3 refills | Status: AC

## 2024-08-18 NOTE — Progress Notes (Signed)
 Sent in Rx, pt has read results

## 2024-08-18 NOTE — Telephone Encounter (Signed)
 Medicines are updated in chart

## 2024-08-19 ENCOUNTER — Ambulatory Visit (INDEPENDENT_AMBULATORY_CARE_PROVIDER_SITE_OTHER): Admitting: Psychiatry

## 2024-08-19 DIAGNOSIS — F411 Generalized anxiety disorder: Secondary | ICD-10-CM | POA: Diagnosis not present

## 2024-08-19 NOTE — Progress Notes (Signed)
 " BEHAVIORAL [facility] [address] [place] [address] Dept: [phone] Dept Fax: [phone]  Psychotherapy Progress Note  Patient ID: Janice Arnold, female  DOB: 1981-06-16, 44 y.o.  MRN: [id number]  08/19/2024 Start time: 8am End time: 9am  Method of Visit: Face-to-Face  Present: patient  Current Concerns: husband's medical condition, lack of time to self  Current Symptoms: Anxiety and Depressed Mood  Psychiatric Specialty Exam: General Appearance: appears at stated age, casually dressed and groomed   Behavior: pleasant and cooperative   Psychomotor Activity: no psychomotor agitation or retardation noted   Eye Contact: fair  Speech: normal amount, volume and fluency    Mood: depressed, anxious  Affect: congruent  Thought Process: linear, goal directed, no circumstantial or tangential thought process noted, no racing thoughts or flight of ideas  Descriptions of Associations: intact   Thought Content Hallucinations: denies AH, VH , does not appear responding to stimuli  Delusions: no paranoia, delusions of control, grandeur, ideas of reference, thought broadcasting, and magical thinking  Suicidal Thoughts: denies SI, intention, plan  Homicidal Thoughts: denies HI, intention, plan   Alertness/Orientation: alert and fully oriented   Insight: fair Judgment: fair  Memory: intact   Executive Functions  Concentration: intact  Attention Span: fair  Recall: intact  Fund of Knowledge: fair   Diagnosis: MDD, GAD  Anticipated Frequency of Visits: every other week Anticipated Length of Treatment Episode: 8-10 months  Short Term Goals/Goals for Treatment Session:    1) decrease anxiety 2) resist flight/freeze response 3) identify anxiety inducing thoughts -- identify why she feels like she always has to be doing something 4) use relaxation strategies (deep breathing, visualization, cognitive cueing, muscle  relaxation)   1) improved mood -- Wrote a confirmation card to place on her wall to read out loud daily 2) increase energy level -- discuss spending at least 30 minutes daily to spend alone doing something that is relaxing 3) increase socialization -- trying not to always be with her husband 4) decrease anhedonia 5) utilized cognitive behavioral therapy principles  Patient plans to go with her husband to his doctor's appt to discuss options on finding a caregiver as she feels burnt out.  Progress Towards Goals: slightly progressing, haivng difficulty incorporating her distress tolerance techniques because of the stress with her husband who has Wernike's  Treatment Intervention: Cognitive Behavioral therapy  Medical Necessity: Improved patient condition  Assessment Tools:    04/14/2024   10:43 AM 08/07/2023   11:04 AM 07/02/2023   11:21 AM  Depression screen PHQ 2/9  Decreased Interest 2 2 1   Down, Depressed, Hopeless 2 1 1   PHQ - 2 Score 4 3 2   Altered sleeping 3 3 3   Tired, decreased energy 3 2 3   Change in appetite 3 3 3   Feeling bad or failure about yourself  2 0 1  Trouble concentrating 3 2 3   Moving slowly or fidgety/restless 2 0 0  Suicidal thoughts 0 0 0  PHQ-9 Score 20  13  15    Difficult doing work/chores Somewhat difficult Extremely dIfficult Extremely dIfficult     Data saved with a previous flowsheet row definition   Failed to redirect to the Timeline version of the REVFS SmartLink. Flowsheet Row US  [facility] 10/22/2023 [facility] from 09/30/2023 in [place] [facility] at [facility] from 06/27/2023 in [facility] at [facility]  C-SSRS RISK CATEGORY  No Risk No Risk No Risk     Patient/Guardian was advised Release of Information must be obtained prior to any record release in order to collaborate their care with an outside provider. Patient/Guardian was advised  if they have not already done so to contact the registration department to sign all necessary forms in order for us  to release information regarding their care.   Consent: Patient/Guardian gives verbal consent for treatment and assignment of benefits for services provided during this visit. Patient/Guardian expressed understanding and agreed to proceed.   Plan: f/u in 2 weeks, read confirmation card, find 30 minutes daily to relax alone  Ismael KATHEE Franco, MD 08/19/2024           "

## 2024-08-23 ENCOUNTER — Telehealth (HOSPITAL_COMMUNITY): Payer: Self-pay | Admitting: Psychiatry

## 2024-08-23 ENCOUNTER — Other Ambulatory Visit: Payer: Self-pay | Admitting: Neurology

## 2024-08-23 ENCOUNTER — Ambulatory Visit (HOSPITAL_COMMUNITY): Admitting: Psychiatry

## 2024-08-23 DIAGNOSIS — G8929 Other chronic pain: Secondary | ICD-10-CM

## 2024-08-23 MED ORDER — TOPIRAMATE 100 MG PO TABS: 100.0000 mg | ORAL_TABLET | Freq: Two times a day (BID) | ORAL | 1 refills | Status: AC

## 2024-08-23 NOTE — Telephone Encounter (Signed)
 Patient did not show up for the appointment. Attempted to call the patient at 3:05 PM but received no response. Left a HIPAA compliant voicemail for patient to reschedule.   Ismael Franco, MD PGY-3 Psychiatry Resident

## 2024-08-26 ENCOUNTER — Ambulatory Visit (HOSPITAL_COMMUNITY): Admitting: Psychiatry

## 2024-08-31 ENCOUNTER — Other Ambulatory Visit (HOSPITAL_COMMUNITY)

## 2024-08-31 NOTE — Discharge Instructions (Signed)

## 2024-09-01 ENCOUNTER — Ambulatory Visit
Admission: RE | Admit: 2024-09-01 | Discharge: 2024-09-01 | Disposition: A | Source: Ambulatory Visit | Attending: Neurology

## 2024-09-01 VITALS — BP 156/93 | HR 70

## 2024-09-01 DIAGNOSIS — G8929 Other chronic pain: Secondary | ICD-10-CM

## 2024-09-02 ENCOUNTER — Ambulatory Visit: Payer: Self-pay | Admitting: Neurology

## 2024-09-02 ENCOUNTER — Ambulatory Visit (HOSPITAL_COMMUNITY): Admitting: Psychiatry

## 2024-09-03 ENCOUNTER — Encounter: Payer: Self-pay | Admitting: Neurology

## 2024-09-05 LAB — PROTEIN, CSF: Total Protein, CSF: 51 mg/dL — ABNORMAL HIGH (ref 15–45)

## 2024-09-05 LAB — CSF CELL COUNT WITH DIFFERENTIAL
RBC Count, CSF: 1050 {cells}/uL — ABNORMAL HIGH
TOTAL NUCLEATED CELL: 0 {cells}/uL (ref 0–5)

## 2024-09-05 LAB — GLUCOSE, CSF: Glucose, CSF: 91 mg/dL — ABNORMAL HIGH (ref 40–80)

## 2024-09-07 ENCOUNTER — Other Ambulatory Visit: Payer: Self-pay | Admitting: Neurology

## 2024-09-07 MED ORDER — BUTALBITAL-APAP-CAFFEINE 50-325-40 MG PO TABS: 1.0000 | ORAL_TABLET | Freq: Four times a day (QID) | ORAL | 0 refills | Status: AC | PRN

## 2024-09-15 ENCOUNTER — Encounter (HOSPITAL_COMMUNITY): Payer: Self-pay | Admitting: Psychiatry

## 2024-09-15 ENCOUNTER — Other Ambulatory Visit: Payer: Self-pay | Admitting: Family Medicine

## 2024-09-15 DIAGNOSIS — E119 Type 2 diabetes mellitus without complications: Secondary | ICD-10-CM

## 2024-09-16 ENCOUNTER — Ambulatory Visit (HOSPITAL_COMMUNITY): Admitting: Psychiatry

## 2024-09-16 ENCOUNTER — Ambulatory Visit: Admitting: Family Medicine

## 2024-09-16 ENCOUNTER — Telehealth (HOSPITAL_COMMUNITY): Payer: Self-pay | Admitting: Psychiatry

## 2024-09-21 ENCOUNTER — Ambulatory Visit: Admitting: Family Medicine

## 2024-09-27 ENCOUNTER — Other Ambulatory Visit

## 2024-09-30 ENCOUNTER — Ambulatory Visit (HOSPITAL_COMMUNITY): Admitting: Psychiatry

## 2024-10-05 ENCOUNTER — Ambulatory Visit: Admitting: Neurology

## 2024-10-14 ENCOUNTER — Ambulatory Visit (HOSPITAL_COMMUNITY): Admitting: Psychiatry

## 2024-10-14 ENCOUNTER — Ambulatory Visit: Admitting: Family Medicine

## 2024-11-15 ENCOUNTER — Ambulatory Visit: Admitting: Family Medicine

## 2024-11-15 ENCOUNTER — Ambulatory Visit: Admitting: "Endocrinology

## 2024-11-22 ENCOUNTER — Ambulatory Visit: Admitting: Endocrinology

## 2024-12-16 ENCOUNTER — Ambulatory Visit: Admitting: "Endocrinology

## 2025-02-08 ENCOUNTER — Ambulatory Visit: Admitting: Neurology
